# Patient Record
Sex: Female | Born: 1935 | Race: White | Hispanic: No | Smoking: Former smoker
Health system: Southern US, Community
[De-identification: ages and names within clinical notes are randomized; demographics above are authoritative.]

## PROBLEM LIST (undated history)

## (undated) DIAGNOSIS — M160 Bilateral primary osteoarthritis of hip: Secondary | ICD-10-CM

## (undated) DIAGNOSIS — G2581 Restless legs syndrome: Secondary | ICD-10-CM

## (undated) DIAGNOSIS — H3530 Unspecified macular degeneration: Secondary | ICD-10-CM

## (undated) DIAGNOSIS — G5760 Lesion of plantar nerve, unspecified lower limb: Secondary | ICD-10-CM

## (undated) DIAGNOSIS — R0600 Dyspnea, unspecified: Secondary | ICD-10-CM

## (undated) DIAGNOSIS — M7062 Trochanteric bursitis, left hip: Secondary | ICD-10-CM

## (undated) DIAGNOSIS — E11621 Type 2 diabetes mellitus with foot ulcer: Secondary | ICD-10-CM

## (undated) DIAGNOSIS — G4733 Obstructive sleep apnea (adult) (pediatric): Secondary | ICD-10-CM

## (undated) DIAGNOSIS — E118 Type 2 diabetes mellitus with unspecified complications: Secondary | ICD-10-CM

## (undated) DIAGNOSIS — R5383 Other fatigue: Secondary | ICD-10-CM

## (undated) DIAGNOSIS — I10 Essential (primary) hypertension: Secondary | ICD-10-CM

## (undated) DIAGNOSIS — G629 Polyneuropathy, unspecified: Secondary | ICD-10-CM

## (undated) DIAGNOSIS — K2960 Other gastritis without bleeding: Secondary | ICD-10-CM

## (undated) DIAGNOSIS — M5136 Other intervertebral disc degeneration, lumbar region: Secondary | ICD-10-CM

## (undated) DIAGNOSIS — R945 Abnormal results of liver function studies: Secondary | ICD-10-CM

## (undated) DIAGNOSIS — N3010 Interstitial cystitis (chronic) without hematuria: Secondary | ICD-10-CM

## (undated) DIAGNOSIS — E669 Obesity, unspecified: Secondary | ICD-10-CM

## (undated) DIAGNOSIS — M159 Polyosteoarthritis, unspecified: Secondary | ICD-10-CM

## (undated) DIAGNOSIS — Z87891 Personal history of nicotine dependence: Secondary | ICD-10-CM

## (undated) DIAGNOSIS — M51369 Other intervertebral disc degeneration, lumbar region without mention of lumbar back pain or lower extremity pain: Secondary | ICD-10-CM

## (undated) DIAGNOSIS — N951 Menopausal and female climacteric states: Secondary | ICD-10-CM

## (undated) DIAGNOSIS — I5032 Chronic diastolic (congestive) heart failure: Secondary | ICD-10-CM

## (undated) DIAGNOSIS — G5750 Tarsal tunnel syndrome, unspecified lower limb: Secondary | ICD-10-CM

## (undated) DIAGNOSIS — G20 Parkinson's disease: Secondary | ICD-10-CM

## (undated) DIAGNOSIS — G894 Chronic pain syndrome: Secondary | ICD-10-CM

## (undated) DIAGNOSIS — M7061 Trochanteric bursitis, right hip: Secondary | ICD-10-CM

## (undated) DIAGNOSIS — J189 Pneumonia, unspecified organism: Secondary | ICD-10-CM

## (undated) DIAGNOSIS — R1031 Right lower quadrant pain: Secondary | ICD-10-CM

## (undated) DIAGNOSIS — I872 Venous insufficiency (chronic) (peripheral): Secondary | ICD-10-CM

## (undated) DIAGNOSIS — Z9989 Dependence on other enabling machines and devices: Secondary | ICD-10-CM

## (undated) DIAGNOSIS — H269 Unspecified cataract: Secondary | ICD-10-CM

## (undated) DIAGNOSIS — K644 Residual hemorrhoidal skin tags: Secondary | ICD-10-CM

## (undated) DIAGNOSIS — R011 Cardiac murmur, unspecified: Secondary | ICD-10-CM

## (undated) DIAGNOSIS — E782 Mixed hyperlipidemia: Secondary | ICD-10-CM

## (undated) DIAGNOSIS — E66812 Obesity, class 2: Secondary | ICD-10-CM

## (undated) DIAGNOSIS — E1142 Type 2 diabetes mellitus with diabetic polyneuropathy: Secondary | ICD-10-CM

## (undated) DIAGNOSIS — I119 Hypertensive heart disease without heart failure: Secondary | ICD-10-CM

## (undated) DIAGNOSIS — E039 Hypothyroidism, unspecified: Secondary | ICD-10-CM

## (undated) DIAGNOSIS — K5730 Diverticulosis of large intestine without perforation or abscess without bleeding: Secondary | ICD-10-CM

## (undated) DIAGNOSIS — Z86010 Personal history of colonic polyps: Secondary | ICD-10-CM

## (undated) DIAGNOSIS — M419 Scoliosis, unspecified: Secondary | ICD-10-CM

## (undated) DIAGNOSIS — H35349 Macular cyst, hole, or pseudohole, unspecified eye: Secondary | ICD-10-CM

## (undated) DIAGNOSIS — K589 Irritable bowel syndrome without diarrhea: Secondary | ICD-10-CM

## (undated) HISTORY — DX: Chronic diastolic (congestive) heart failure: I50.32

## (undated) HISTORY — DX: Abnormal results of liver function studies: R94.5

## (undated) HISTORY — DX: Polyneuropathy, unspecified: G62.9

## (undated) HISTORY — DX: Tarsal tunnel syndrome, unspecified lower limb: G57.50

## (undated) HISTORY — DX: Obesity, class 2: E66.812

## (undated) HISTORY — PX: OTHER SURGICAL HISTORY: SHX169

## (undated) HISTORY — DX: Menopausal and female climacteric states: N95.1

## (undated) HISTORY — PX: HAMMER TOE SURGERY: SHX385

## (undated) HISTORY — PX: TONSILLECTOMY: SUR1361

## (undated) HISTORY — DX: Obesity, unspecified: E66.9

## (undated) HISTORY — DX: Other intervertebral disc degeneration, lumbar region without mention of lumbar back pain or lower extremity pain: M51.369

## (undated) HISTORY — DX: Venous insufficiency (chronic) (peripheral): I87.2

## (undated) HISTORY — DX: Dyspnea, unspecified: R06.00

## (undated) HISTORY — DX: Obstructive sleep apnea (adult) (pediatric): G47.33

## (undated) HISTORY — PX: ANKLE SURGERY: SHX546

## (undated) HISTORY — PX: TRANSTHORACIC ECHOCARDIOGRAM: SHX275

## (undated) HISTORY — DX: Unspecified cataract: H26.9

## (undated) HISTORY — PX: CARDIAC CATHETERIZATION: SHX172

## (undated) HISTORY — PX: TOTAL ABDOMINAL HYSTERECTOMY W/ BILATERAL SALPINGOOPHORECTOMY: SHX83

## (undated) HISTORY — DX: Trochanteric bursitis, right hip: M70.61

## (undated) HISTORY — DX: Scoliosis, unspecified: M41.9

## (undated) HISTORY — DX: Dependence on other enabling machines and devices: Z99.89

## (undated) HISTORY — DX: Chronic pain syndrome: G89.4

## (undated) HISTORY — DX: Bilateral primary osteoarthritis of hip: M16.0

## (undated) HISTORY — DX: Macular cyst, hole, or pseudohole, unspecified eye: H35.349

## (undated) HISTORY — DX: Irritable bowel syndrome, unspecified: K58.9

## (undated) HISTORY — DX: Hypertensive heart disease without heart failure: I11.9

## (undated) HISTORY — DX: Other intervertebral disc degeneration, lumbar region: M51.36

## (undated) HISTORY — DX: Right lower quadrant pain: R10.31

## (undated) HISTORY — PX: FOOT SURGERY: SHX648

## (undated) HISTORY — DX: Personal history of colonic polyps: Z86.010

## (undated) HISTORY — DX: Essential (primary) hypertension: I10

## (undated) HISTORY — DX: Other gastritis without bleeding: K29.60

## (undated) HISTORY — DX: Residual hemorrhoidal skin tags: K64.4

## (undated) HISTORY — DX: Lesion of plantar nerve, unspecified lower limb: G57.60

## (undated) HISTORY — DX: Hypothyroidism, unspecified: E03.9

## (undated) HISTORY — DX: Trochanteric bursitis, left hip: M70.62

## (undated) HISTORY — DX: Personal history of nicotine dependence: Z87.891

## (undated) HISTORY — DX: Diverticulosis of large intestine without perforation or abscess without bleeding: K57.30

## (undated) HISTORY — DX: Restless legs syndrome: G25.81

## (undated) HISTORY — DX: Mixed hyperlipidemia: E78.2

## (undated) HISTORY — DX: Unspecified macular degeneration: H35.30

## (undated) HISTORY — PX: COLONOSCOPY: SHX174

## (undated) HISTORY — DX: Type 2 diabetes mellitus with diabetic polyneuropathy: E11.42

## (undated) HISTORY — DX: Type 2 diabetes mellitus with unspecified complications: E11.8

## (undated) HISTORY — PX: CATARACT EXTRACTION: SUR2

## (undated) HISTORY — DX: Other fatigue: R53.83

## (undated) HISTORY — DX: Interstitial cystitis (chronic) without hematuria: N30.10

## (undated) SURGERY — Surgical Case
Anesthesia: *Unknown

---
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
## 1999-04-22 ENCOUNTER — Encounter: Payer: Self-pay | Admitting: Obstetrics and Gynecology

## 1999-04-22 ENCOUNTER — Encounter: Admission: RE | Admit: 1999-04-22 | Discharge: 1999-04-22 | Payer: Self-pay | Admitting: Obstetrics and Gynecology

## 2000-04-23 ENCOUNTER — Encounter: Admission: RE | Admit: 2000-04-23 | Discharge: 2000-04-23 | Payer: Self-pay | Admitting: Obstetrics and Gynecology

## 2000-04-23 ENCOUNTER — Encounter: Payer: Self-pay | Admitting: Obstetrics and Gynecology

## 2000-04-27 ENCOUNTER — Other Ambulatory Visit: Admission: RE | Admit: 2000-04-27 | Discharge: 2000-04-27 | Payer: Self-pay | Admitting: Obstetrics and Gynecology

## 2001-07-03 ENCOUNTER — Encounter: Admission: RE | Admit: 2001-07-03 | Discharge: 2001-07-03 | Payer: Self-pay | Admitting: Internal Medicine

## 2001-07-03 ENCOUNTER — Encounter: Payer: Self-pay | Admitting: Internal Medicine

## 2002-07-01 ENCOUNTER — Encounter: Admission: RE | Admit: 2002-07-01 | Discharge: 2002-07-01 | Payer: Self-pay | Admitting: Internal Medicine

## 2002-07-01 ENCOUNTER — Encounter: Payer: Self-pay | Admitting: Internal Medicine

## 2003-07-03 ENCOUNTER — Encounter: Admission: RE | Admit: 2003-07-03 | Discharge: 2003-07-03 | Payer: Self-pay | Admitting: Internal Medicine

## 2004-01-29 ENCOUNTER — Ambulatory Visit: Payer: Self-pay | Admitting: Internal Medicine

## 2004-03-10 ENCOUNTER — Ambulatory Visit: Payer: Self-pay | Admitting: Internal Medicine

## 2004-05-11 ENCOUNTER — Ambulatory Visit: Payer: Self-pay | Admitting: Internal Medicine

## 2004-06-30 ENCOUNTER — Ambulatory Visit: Payer: Self-pay | Admitting: Internal Medicine

## 2004-07-18 ENCOUNTER — Encounter: Admission: RE | Admit: 2004-07-18 | Discharge: 2004-07-18 | Payer: Self-pay | Admitting: Internal Medicine

## 2004-08-13 ENCOUNTER — Ambulatory Visit: Payer: Self-pay | Admitting: Family Medicine

## 2004-08-18 ENCOUNTER — Ambulatory Visit: Payer: Self-pay | Admitting: Internal Medicine

## 2004-08-25 ENCOUNTER — Ambulatory Visit: Payer: Self-pay | Admitting: Internal Medicine

## 2004-09-14 ENCOUNTER — Ambulatory Visit: Payer: Self-pay | Admitting: Gastroenterology

## 2004-09-16 ENCOUNTER — Ambulatory Visit: Payer: Self-pay | Admitting: Cardiology

## 2004-10-21 ENCOUNTER — Encounter: Admission: RE | Admit: 2004-10-21 | Discharge: 2004-10-21 | Payer: Self-pay | Admitting: Internal Medicine

## 2004-10-21 ENCOUNTER — Ambulatory Visit: Payer: Self-pay | Admitting: Internal Medicine

## 2004-10-28 ENCOUNTER — Ambulatory Visit: Payer: Self-pay | Admitting: Internal Medicine

## 2004-12-19 ENCOUNTER — Ambulatory Visit: Payer: Self-pay | Admitting: Internal Medicine

## 2004-12-19 ENCOUNTER — Encounter: Admission: RE | Admit: 2004-12-19 | Discharge: 2004-12-19 | Payer: Self-pay | Admitting: Internal Medicine

## 2005-01-10 ENCOUNTER — Ambulatory Visit: Payer: Self-pay | Admitting: Internal Medicine

## 2005-01-25 ENCOUNTER — Ambulatory Visit: Payer: Self-pay | Admitting: Internal Medicine

## 2005-02-23 ENCOUNTER — Ambulatory Visit: Payer: Self-pay | Admitting: Internal Medicine

## 2005-03-31 ENCOUNTER — Ambulatory Visit: Payer: Self-pay | Admitting: Internal Medicine

## 2005-04-04 ENCOUNTER — Ambulatory Visit: Payer: Self-pay | Admitting: Internal Medicine

## 2005-04-28 ENCOUNTER — Ambulatory Visit: Payer: Self-pay | Admitting: Internal Medicine

## 2005-05-01 ENCOUNTER — Ambulatory Visit: Payer: Self-pay | Admitting: Internal Medicine

## 2005-05-23 ENCOUNTER — Ambulatory Visit: Payer: Self-pay | Admitting: Internal Medicine

## 2005-06-21 ENCOUNTER — Ambulatory Visit: Payer: Self-pay | Admitting: Internal Medicine

## 2005-07-20 ENCOUNTER — Encounter: Admission: RE | Admit: 2005-07-20 | Discharge: 2005-07-20 | Payer: Self-pay | Admitting: Internal Medicine

## 2005-07-31 ENCOUNTER — Ambulatory Visit: Payer: Self-pay | Admitting: Internal Medicine

## 2005-08-16 ENCOUNTER — Ambulatory Visit: Payer: Self-pay | Admitting: Internal Medicine

## 2005-08-18 ENCOUNTER — Ambulatory Visit: Payer: Self-pay | Admitting: Endocrinology

## 2005-08-30 ENCOUNTER — Ambulatory Visit: Payer: Self-pay | Admitting: Internal Medicine

## 2005-11-06 ENCOUNTER — Ambulatory Visit: Payer: Self-pay | Admitting: Internal Medicine

## 2005-11-14 ENCOUNTER — Ambulatory Visit: Payer: Self-pay | Admitting: Internal Medicine

## 2005-12-12 ENCOUNTER — Ambulatory Visit (HOSPITAL_COMMUNITY): Admission: RE | Admit: 2005-12-12 | Discharge: 2005-12-12 | Payer: Self-pay | Admitting: Internal Medicine

## 2005-12-25 ENCOUNTER — Ambulatory Visit: Payer: Self-pay | Admitting: Internal Medicine

## 2006-01-03 ENCOUNTER — Ambulatory Visit: Payer: Self-pay | Admitting: Internal Medicine

## 2006-01-10 ENCOUNTER — Ambulatory Visit: Payer: Self-pay

## 2006-01-19 ENCOUNTER — Ambulatory Visit: Payer: Self-pay | Admitting: Internal Medicine

## 2006-01-31 ENCOUNTER — Ambulatory Visit (HOSPITAL_COMMUNITY): Admission: RE | Admit: 2006-01-31 | Discharge: 2006-01-31 | Payer: Self-pay | Admitting: Internal Medicine

## 2006-02-01 ENCOUNTER — Ambulatory Visit: Payer: Self-pay | Admitting: Internal Medicine

## 2006-02-01 ENCOUNTER — Ambulatory Visit: Payer: Self-pay

## 2006-02-16 ENCOUNTER — Ambulatory Visit: Payer: Self-pay | Admitting: Internal Medicine

## 2006-02-28 ENCOUNTER — Encounter: Admission: RE | Admit: 2006-02-28 | Discharge: 2006-03-29 | Payer: Self-pay | Admitting: Internal Medicine

## 2006-05-07 ENCOUNTER — Ambulatory Visit: Payer: Self-pay | Admitting: Internal Medicine

## 2006-06-05 ENCOUNTER — Ambulatory Visit: Payer: Self-pay | Admitting: Internal Medicine

## 2006-07-23 ENCOUNTER — Encounter: Admission: RE | Admit: 2006-07-23 | Discharge: 2006-07-23 | Payer: Self-pay | Admitting: Internal Medicine

## 2006-08-07 ENCOUNTER — Ambulatory Visit: Payer: Self-pay | Admitting: Internal Medicine

## 2006-09-20 ENCOUNTER — Ambulatory Visit: Payer: Self-pay | Admitting: Internal Medicine

## 2006-09-26 ENCOUNTER — Encounter: Payer: Self-pay | Admitting: Internal Medicine

## 2006-10-10 DIAGNOSIS — K589 Irritable bowel syndrome without diarrhea: Secondary | ICD-10-CM | POA: Insufficient documentation

## 2006-10-10 DIAGNOSIS — E039 Hypothyroidism, unspecified: Secondary | ICD-10-CM | POA: Insufficient documentation

## 2006-10-10 DIAGNOSIS — E785 Hyperlipidemia, unspecified: Secondary | ICD-10-CM | POA: Insufficient documentation

## 2006-10-10 DIAGNOSIS — I10 Essential (primary) hypertension: Secondary | ICD-10-CM

## 2006-10-10 DIAGNOSIS — G5760 Lesion of plantar nerve, unspecified lower limb: Secondary | ICD-10-CM | POA: Insufficient documentation

## 2006-10-22 ENCOUNTER — Ambulatory Visit: Payer: Self-pay | Admitting: Internal Medicine

## 2006-11-02 ENCOUNTER — Ambulatory Visit: Payer: Self-pay | Admitting: Internal Medicine

## 2006-11-14 ENCOUNTER — Ambulatory Visit (HOSPITAL_COMMUNITY): Admission: RE | Admit: 2006-11-14 | Discharge: 2006-11-14 | Payer: Self-pay | Admitting: Internal Medicine

## 2006-11-14 ENCOUNTER — Encounter: Payer: Self-pay | Admitting: Internal Medicine

## 2006-11-14 DIAGNOSIS — K5730 Diverticulosis of large intestine without perforation or abscess without bleeding: Secondary | ICD-10-CM | POA: Insufficient documentation

## 2006-11-14 DIAGNOSIS — K644 Residual hemorrhoidal skin tags: Secondary | ICD-10-CM | POA: Insufficient documentation

## 2006-11-22 ENCOUNTER — Ambulatory Visit: Payer: Self-pay | Admitting: Internal Medicine

## 2006-12-26 ENCOUNTER — Ambulatory Visit: Payer: Self-pay | Admitting: Internal Medicine

## 2007-01-03 ENCOUNTER — Ambulatory Visit: Payer: Self-pay | Admitting: Internal Medicine

## 2007-01-17 ENCOUNTER — Encounter: Admission: RE | Admit: 2007-01-17 | Discharge: 2007-01-17 | Payer: Self-pay | Admitting: Urology

## 2007-01-18 ENCOUNTER — Ambulatory Visit (HOSPITAL_BASED_OUTPATIENT_CLINIC_OR_DEPARTMENT_OTHER): Admission: RE | Admit: 2007-01-18 | Discharge: 2007-01-18 | Payer: Self-pay | Admitting: Urology

## 2007-02-15 ENCOUNTER — Ambulatory Visit: Payer: Self-pay | Admitting: Internal Medicine

## 2007-02-19 ENCOUNTER — Telehealth (INDEPENDENT_AMBULATORY_CARE_PROVIDER_SITE_OTHER): Payer: Self-pay | Admitting: *Deleted

## 2007-02-22 ENCOUNTER — Ambulatory Visit: Payer: Self-pay | Admitting: Internal Medicine

## 2007-02-22 DIAGNOSIS — M76899 Other specified enthesopathies of unspecified lower limb, excluding foot: Secondary | ICD-10-CM | POA: Insufficient documentation

## 2007-02-24 ENCOUNTER — Encounter: Payer: Self-pay | Admitting: Internal Medicine

## 2007-02-26 ENCOUNTER — Encounter: Payer: Self-pay | Admitting: Internal Medicine

## 2007-02-27 ENCOUNTER — Encounter: Payer: Self-pay | Admitting: Internal Medicine

## 2007-03-04 ENCOUNTER — Encounter: Payer: Self-pay | Admitting: Internal Medicine

## 2007-04-24 ENCOUNTER — Encounter: Payer: Self-pay | Admitting: Internal Medicine

## 2007-04-26 DIAGNOSIS — R197 Diarrhea, unspecified: Secondary | ICD-10-CM

## 2007-04-26 DIAGNOSIS — N3010 Interstitial cystitis (chronic) without hematuria: Secondary | ICD-10-CM

## 2007-04-26 DIAGNOSIS — F411 Generalized anxiety disorder: Secondary | ICD-10-CM | POA: Insufficient documentation

## 2007-04-30 ENCOUNTER — Encounter: Payer: Self-pay | Admitting: Internal Medicine

## 2007-05-02 ENCOUNTER — Ambulatory Visit: Payer: Self-pay | Admitting: Internal Medicine

## 2007-05-02 LAB — CONVERTED CEMR LAB
AST: 20 units/L (ref 0–37)
Albumin: 4 g/dL (ref 3.5–5.2)
Alkaline Phosphatase: 81 units/L (ref 39–117)
BUN: 17 mg/dL (ref 6–23)
Basophils Absolute: 0 10*3/uL (ref 0.0–0.1)
Calcium: 10 mg/dL (ref 8.4–10.5)
Chloride: 100 meq/L (ref 96–112)
Cholesterol: 204 mg/dL (ref 0–200)
Eosinophils Absolute: 0 10*3/uL (ref 0.0–0.6)
Eosinophils Relative: 0.1 % (ref 0.0–5.0)
GFR calc Af Amer: 106 mL/min
Glucose, Bld: 98 mg/dL (ref 70–99)
Monocytes Relative: 4.4 % (ref 3.0–11.0)
Platelets: 392 10*3/uL (ref 150–400)
Potassium: 4.2 meq/L (ref 3.5–5.1)
RBC: 4.62 M/uL (ref 3.87–5.11)
RDW: 13 % (ref 11.5–14.6)
Sodium: 139 meq/L (ref 135–145)
TSH: 3.3 microintl units/mL (ref 0.35–5.50)
Total Bilirubin: 0.9 mg/dL (ref 0.3–1.2)

## 2007-05-03 ENCOUNTER — Telehealth: Payer: Self-pay | Admitting: Internal Medicine

## 2007-05-07 ENCOUNTER — Encounter: Payer: Self-pay | Admitting: Internal Medicine

## 2007-05-07 LAB — CONVERTED CEMR LAB
Dopamine 24 Hr Urine: 138 mcg/24hr (ref <500)
Epinephrine 24 Hr Urine: 7 mcg/24hr (ref <20)
Metaneph Total, Ur: 643 ug/24hr (ref 224–832)
Normetanephrine, 24H Ur: 547 (ref 122–676)

## 2007-05-21 ENCOUNTER — Encounter: Payer: Self-pay | Admitting: Internal Medicine

## 2007-05-23 ENCOUNTER — Ambulatory Visit: Payer: Self-pay | Admitting: Internal Medicine

## 2007-06-01 ENCOUNTER — Ambulatory Visit: Payer: Self-pay | Admitting: Family Medicine

## 2007-06-17 ENCOUNTER — Encounter: Payer: Self-pay | Admitting: Internal Medicine

## 2007-07-04 ENCOUNTER — Encounter: Admission: RE | Admit: 2007-07-04 | Discharge: 2007-08-27 | Payer: Self-pay | Admitting: Orthopedic Surgery

## 2007-07-24 ENCOUNTER — Encounter: Admission: RE | Admit: 2007-07-24 | Discharge: 2007-07-24 | Payer: Self-pay | Admitting: Internal Medicine

## 2007-08-27 ENCOUNTER — Encounter: Payer: Self-pay | Admitting: Internal Medicine

## 2007-09-09 ENCOUNTER — Ambulatory Visit: Payer: Self-pay | Admitting: Internal Medicine

## 2007-09-23 ENCOUNTER — Telehealth: Payer: Self-pay | Admitting: Internal Medicine

## 2007-09-23 ENCOUNTER — Encounter: Payer: Self-pay | Admitting: Internal Medicine

## 2007-09-24 ENCOUNTER — Telehealth: Payer: Self-pay | Admitting: Internal Medicine

## 2007-10-09 ENCOUNTER — Ambulatory Visit: Payer: Self-pay | Admitting: Internal Medicine

## 2007-10-14 ENCOUNTER — Encounter: Payer: Self-pay | Admitting: Internal Medicine

## 2007-10-22 ENCOUNTER — Encounter: Payer: Self-pay | Admitting: Internal Medicine

## 2007-10-22 ENCOUNTER — Telehealth: Payer: Self-pay | Admitting: Internal Medicine

## 2007-10-29 ENCOUNTER — Ambulatory Visit: Payer: Self-pay | Admitting: Internal Medicine

## 2007-10-30 ENCOUNTER — Telehealth: Payer: Self-pay | Admitting: Internal Medicine

## 2007-11-18 ENCOUNTER — Encounter: Payer: Self-pay | Admitting: Internal Medicine

## 2007-11-26 ENCOUNTER — Encounter: Payer: Self-pay | Admitting: Internal Medicine

## 2007-12-10 ENCOUNTER — Encounter: Payer: Self-pay | Admitting: Internal Medicine

## 2008-01-02 ENCOUNTER — Encounter: Payer: Self-pay | Admitting: Internal Medicine

## 2008-01-20 ENCOUNTER — Encounter: Payer: Self-pay | Admitting: Internal Medicine

## 2008-02-04 ENCOUNTER — Encounter: Payer: Self-pay | Admitting: Internal Medicine

## 2008-02-24 ENCOUNTER — Encounter: Payer: Self-pay | Admitting: Internal Medicine

## 2008-02-25 ENCOUNTER — Ambulatory Visit: Payer: Self-pay | Admitting: Internal Medicine

## 2008-02-26 LAB — CONVERTED CEMR LAB
BUN: 24 mg/dL — ABNORMAL HIGH (ref 6–23)
CO2: 23 meq/L (ref 19–32)
Calcium: 9.5 mg/dL (ref 8.4–10.5)
Chloride: 103 meq/L (ref 96–112)
Creatinine, Ser: 0.7 mg/dL (ref 0.4–1.2)
Folate: >20 ng/mL
Free T4: 0.7 ng/dL (ref 0.6–1.6)
GFR calc Af Amer: 106 mL/min
GFR calc non Af Amer: 87 mL/min
Monocytes Relative: 3.9 % (ref 3.0–12.0)
Potassium: 4.3 meq/L (ref 3.5–5.1)
Saturation Ratios: 18.7 % — ABNORMAL LOW (ref 20.0–50.0)
Sodium: 138 meq/L (ref 135–145)
TSH: 2.24 microintl units/mL (ref 0.35–5.50)
Transferrin: 331.5 mg/dL (ref >=212.0)
Vitamin B-12: 413 pg/mL (ref 211–911)
WBC: 9.4 10*3/uL (ref 4.5–10.5)

## 2008-03-05 ENCOUNTER — Encounter: Payer: Self-pay | Admitting: Internal Medicine

## 2008-03-10 ENCOUNTER — Ambulatory Visit: Payer: Self-pay | Admitting: Internal Medicine

## 2008-03-15 ENCOUNTER — Telehealth: Payer: Self-pay | Admitting: Internal Medicine

## 2008-03-26 ENCOUNTER — Ambulatory Visit: Payer: Self-pay | Admitting: Internal Medicine

## 2008-03-29 ENCOUNTER — Telehealth: Payer: Self-pay | Admitting: Internal Medicine

## 2008-03-30 ENCOUNTER — Encounter: Payer: Self-pay | Admitting: Internal Medicine

## 2008-04-07 ENCOUNTER — Encounter: Payer: Self-pay | Admitting: Internal Medicine

## 2008-07-28 ENCOUNTER — Encounter: Admission: RE | Admit: 2008-07-28 | Discharge: 2008-07-28 | Payer: Self-pay | Admitting: Internal Medicine

## 2008-08-26 ENCOUNTER — Encounter: Payer: Self-pay | Admitting: Internal Medicine

## 2008-10-03 ENCOUNTER — Encounter: Admission: RE | Admit: 2008-10-03 | Discharge: 2008-10-03 | Payer: Self-pay | Admitting: Orthopedic Surgery

## 2008-10-03 ENCOUNTER — Encounter: Payer: Self-pay | Admitting: Internal Medicine

## 2008-10-08 ENCOUNTER — Encounter: Payer: Self-pay | Admitting: Internal Medicine

## 2008-11-05 ENCOUNTER — Ambulatory Visit: Payer: Self-pay | Admitting: Internal Medicine

## 2008-11-06 ENCOUNTER — Telehealth: Payer: Self-pay | Admitting: Internal Medicine

## 2008-11-09 ENCOUNTER — Encounter: Payer: Self-pay | Admitting: Internal Medicine

## 2008-11-13 ENCOUNTER — Telehealth: Payer: Self-pay | Admitting: Internal Medicine

## 2008-11-14 ENCOUNTER — Encounter: Payer: Self-pay | Admitting: Internal Medicine

## 2008-11-14 ENCOUNTER — Encounter: Admission: RE | Admit: 2008-11-14 | Discharge: 2008-11-14 | Payer: Self-pay | Admitting: Orthopedic Surgery

## 2008-11-17 ENCOUNTER — Encounter: Payer: Self-pay | Admitting: Internal Medicine

## 2008-11-25 ENCOUNTER — Encounter: Payer: Self-pay | Admitting: Internal Medicine

## 2008-12-30 ENCOUNTER — Ambulatory Visit: Payer: Self-pay | Admitting: Internal Medicine

## 2008-12-30 LAB — CONVERTED CEMR LAB
ALT: 46 units/L — ABNORMAL HIGH (ref 0–35)
Alkaline Phosphatase: 87 units/L (ref 39–117)
Basophils Absolute: 0.3 10*3/uL — ABNORMAL HIGH (ref 0.0–0.1)
CO2: 28 meq/L (ref 19–32)
Calcium: 8.7 mg/dL (ref 8.4–10.5)
Chloride: 99 meq/L (ref 96–112)
Eosinophils Absolute: 0.1 10*3/uL (ref 0.0–0.7)
Folate: >20 ng/mL
Glucose, Bld: 115 mg/dL — ABNORMAL HIGH (ref 70–99)
HCT: 39.7 % (ref 36.0–46.0)
HDL: 35.1 mg/dL — ABNORMAL LOW (ref >39.00)
Hemoglobin: 13.7 g/dL (ref 12.0–15.0)
Iron: 44 ug/dL (ref 42–145)
Lymphocytes Relative: 21.9 % (ref 12.0–46.0)
MCHC: 34.5 g/dL (ref 30.0–36.0)
Monocytes Absolute: 0.6 10*3/uL (ref 0.1–1.0)
Monocytes Relative: 7.9 % (ref 3.0–12.0)
Neutrophils Relative %: 65.2 % (ref 43.0–77.0)
Platelets: 305 10*3/uL (ref 150.0–400.0)
RDW: 13 % (ref 11.5–14.6)
Sodium: 139 meq/L (ref 135–145)
TSH: 1.24 microintl units/mL (ref 0.35–5.50)
Triglycerides: 270 mg/dL — ABNORMAL HIGH (ref 0.0–149.0)
Vitamin B-12: 517 pg/mL (ref 211–911)

## 2008-12-31 DIAGNOSIS — N951 Menopausal and female climacteric states: Secondary | ICD-10-CM | POA: Insufficient documentation

## 2009-01-19 ENCOUNTER — Telehealth: Payer: Self-pay | Admitting: Internal Medicine

## 2009-02-02 ENCOUNTER — Encounter: Payer: Self-pay | Admitting: Internal Medicine

## 2009-02-03 ENCOUNTER — Ambulatory Visit: Payer: Self-pay | Admitting: Internal Medicine

## 2009-03-22 ENCOUNTER — Ambulatory Visit: Payer: Self-pay | Admitting: Internal Medicine

## 2009-03-25 ENCOUNTER — Encounter (INDEPENDENT_AMBULATORY_CARE_PROVIDER_SITE_OTHER): Payer: Self-pay | Admitting: *Deleted

## 2009-03-25 ENCOUNTER — Telehealth: Payer: Self-pay | Admitting: Internal Medicine

## 2009-03-30 ENCOUNTER — Telehealth: Payer: Self-pay | Admitting: Internal Medicine

## 2009-03-31 ENCOUNTER — Ambulatory Visit: Payer: Self-pay | Admitting: Gastroenterology

## 2009-03-31 ENCOUNTER — Telehealth: Payer: Self-pay | Admitting: Internal Medicine

## 2009-03-31 DIAGNOSIS — Z862 Personal history of diseases of the blood and blood-forming organs and certain disorders involving the immune mechanism: Secondary | ICD-10-CM

## 2009-03-31 DIAGNOSIS — Z8639 Personal history of other endocrine, nutritional and metabolic disease: Secondary | ICD-10-CM

## 2009-04-01 LAB — CONVERTED CEMR LAB
BUN: 16 mg/dL (ref 6–23)
CO2: 32 meq/L (ref 19–32)
Calcium: 9.3 mg/dL (ref 8.4–10.5)
Chloride: 101 meq/L (ref 96–112)
Creatinine, Ser: 0.7 mg/dL (ref 0.4–1.2)
Potassium: 3.6 meq/L (ref 3.5–5.1)
Total Bilirubin: 0.6 mg/dL (ref 0.3–1.2)
Total Protein: 6.3 g/dL (ref 6.0–8.3)

## 2009-04-05 ENCOUNTER — Ambulatory Visit: Payer: Self-pay | Admitting: Internal Medicine

## 2009-04-19 ENCOUNTER — Telehealth: Payer: Self-pay | Admitting: Internal Medicine

## 2009-05-03 ENCOUNTER — Telehealth: Payer: Self-pay | Admitting: Internal Medicine

## 2009-05-10 ENCOUNTER — Ambulatory Visit: Payer: Self-pay | Admitting: Internal Medicine

## 2009-05-14 ENCOUNTER — Telehealth: Payer: Self-pay | Admitting: Internal Medicine

## 2009-05-25 HISTORY — PX: ESOPHAGOGASTRODUODENOSCOPY: SHX1529

## 2009-05-27 ENCOUNTER — Ambulatory Visit: Payer: Self-pay | Admitting: Internal Medicine

## 2009-05-27 ENCOUNTER — Encounter (INDEPENDENT_AMBULATORY_CARE_PROVIDER_SITE_OTHER): Payer: Self-pay | Admitting: *Deleted

## 2009-05-27 DIAGNOSIS — E669 Obesity, unspecified: Secondary | ICD-10-CM | POA: Insufficient documentation

## 2009-05-27 DIAGNOSIS — R1013 Epigastric pain: Secondary | ICD-10-CM | POA: Insufficient documentation

## 2009-05-31 ENCOUNTER — Ambulatory Visit: Payer: Self-pay | Admitting: Internal Medicine

## 2009-06-07 ENCOUNTER — Ambulatory Visit: Payer: Self-pay | Admitting: Gastroenterology

## 2009-06-07 ENCOUNTER — Telehealth: Payer: Self-pay | Admitting: Internal Medicine

## 2009-06-07 ENCOUNTER — Ambulatory Visit: Payer: Self-pay | Admitting: Internal Medicine

## 2009-06-07 DIAGNOSIS — K2960 Other gastritis without bleeding: Secondary | ICD-10-CM | POA: Insufficient documentation

## 2009-06-07 DIAGNOSIS — R1031 Right lower quadrant pain: Secondary | ICD-10-CM | POA: Insufficient documentation

## 2009-06-07 DIAGNOSIS — R109 Unspecified abdominal pain: Secondary | ICD-10-CM | POA: Insufficient documentation

## 2009-06-07 LAB — CONVERTED CEMR LAB
AST: 23 units/L (ref 0–37)
Albumin: 3.7 g/dL (ref 3.5–5.2)
Amylase: 61 units/L (ref 27–131)
BUN: 17 mg/dL (ref 6–23)
Basophils Absolute: 0 10*3/uL (ref 0.0–0.1)
Basophils Relative: 0 % (ref 0.0–3.0)
Bilirubin Urine: NEGATIVE
Calcium: 9.4 mg/dL (ref 8.4–10.5)
Creatinine, Ser: 0.8 mg/dL (ref 0.4–1.2)
Eosinophils Absolute: 0.1 10*3/uL (ref 0.0–0.7)
Eosinophils Relative: 1.2 % (ref 0.0–5.0)
GFR calc non Af Amer: 74.67 mL/min (ref >60)
Glucose, Bld: 117 mg/dL — ABNORMAL HIGH (ref 70–99)
Hemoglobin, Urine: NEGATIVE
Hemoglobin: 13.8 g/dL (ref 12.0–15.0)
Ketones, ur: NEGATIVE mg/dL
Lipase: 29 units/L (ref 11.0–59.0)
Lymphocytes Relative: 18.5 % (ref 12.0–46.0)
MCHC: 33.8 g/dL (ref 30.0–36.0)
Potassium: 3.7 meq/L (ref 3.5–5.1)
RBC: 4.36 M/uL (ref 3.87–5.11)
Sodium: 142 meq/L (ref 135–145)
TSH: 2.32 microintl units/mL (ref 0.35–5.50)
Total Bilirubin: 0.3 mg/dL (ref 0.3–1.2)
WBC: 9 10*3/uL (ref 4.5–10.5)

## 2009-06-14 ENCOUNTER — Ambulatory Visit: Payer: Self-pay | Admitting: Internal Medicine

## 2009-07-30 ENCOUNTER — Encounter: Admission: RE | Admit: 2009-07-30 | Discharge: 2009-07-30 | Payer: Self-pay | Admitting: Internal Medicine

## 2009-08-16 ENCOUNTER — Encounter: Payer: Self-pay | Admitting: Internal Medicine

## 2009-09-08 ENCOUNTER — Telehealth (INDEPENDENT_AMBULATORY_CARE_PROVIDER_SITE_OTHER): Payer: Self-pay | Admitting: *Deleted

## 2009-09-08 ENCOUNTER — Encounter: Payer: Self-pay | Admitting: Internal Medicine

## 2009-09-09 ENCOUNTER — Ambulatory Visit: Payer: Self-pay | Admitting: Internal Medicine

## 2009-09-09 ENCOUNTER — Encounter: Payer: Self-pay | Admitting: Internal Medicine

## 2009-09-20 ENCOUNTER — Encounter: Payer: Self-pay | Admitting: Internal Medicine

## 2009-09-22 ENCOUNTER — Telehealth: Payer: Self-pay | Admitting: Internal Medicine

## 2009-10-27 ENCOUNTER — Encounter: Payer: Self-pay | Admitting: Internal Medicine

## 2009-11-08 ENCOUNTER — Encounter: Payer: Self-pay | Admitting: Internal Medicine

## 2009-11-23 ENCOUNTER — Telehealth: Payer: Self-pay | Admitting: Internal Medicine

## 2009-12-01 ENCOUNTER — Ambulatory Visit: Payer: Self-pay | Admitting: Internal Medicine

## 2009-12-09 ENCOUNTER — Telehealth: Payer: Self-pay | Admitting: Internal Medicine

## 2009-12-14 ENCOUNTER — Encounter: Payer: Self-pay | Admitting: Internal Medicine

## 2009-12-15 ENCOUNTER — Encounter: Payer: Self-pay | Admitting: Internal Medicine

## 2009-12-21 ENCOUNTER — Telehealth: Payer: Self-pay | Admitting: Internal Medicine

## 2009-12-29 ENCOUNTER — Ambulatory Visit: Payer: Self-pay | Admitting: Vascular Surgery

## 2009-12-29 ENCOUNTER — Encounter: Payer: Self-pay | Admitting: Internal Medicine

## 2010-01-12 ENCOUNTER — Ambulatory Visit: Payer: Self-pay | Admitting: Internal Medicine

## 2010-01-12 DIAGNOSIS — R61 Generalized hyperhidrosis: Secondary | ICD-10-CM

## 2010-01-12 DIAGNOSIS — I872 Venous insufficiency (chronic) (peripheral): Secondary | ICD-10-CM | POA: Insufficient documentation

## 2010-01-12 DIAGNOSIS — G5750 Tarsal tunnel syndrome, unspecified lower limb: Secondary | ICD-10-CM | POA: Insufficient documentation

## 2010-01-14 ENCOUNTER — Ambulatory Visit: Payer: Self-pay | Admitting: Vascular Surgery

## 2010-01-17 ENCOUNTER — Telehealth: Payer: Self-pay | Admitting: Internal Medicine

## 2010-01-26 ENCOUNTER — Encounter: Payer: Self-pay | Admitting: Internal Medicine

## 2010-02-11 ENCOUNTER — Telehealth: Payer: Self-pay | Admitting: Internal Medicine

## 2010-02-16 ENCOUNTER — Telehealth: Payer: Self-pay | Admitting: Internal Medicine

## 2010-02-25 ENCOUNTER — Telehealth: Payer: Self-pay | Admitting: Internal Medicine

## 2010-02-28 ENCOUNTER — Ambulatory Visit: Payer: Self-pay | Admitting: Internal Medicine

## 2010-03-01 ENCOUNTER — Encounter: Payer: Self-pay | Admitting: Internal Medicine

## 2010-03-04 ENCOUNTER — Telehealth: Payer: Self-pay | Admitting: Internal Medicine

## 2010-03-16 ENCOUNTER — Telehealth: Payer: Self-pay | Admitting: Internal Medicine

## 2010-03-29 ENCOUNTER — Ambulatory Visit
Admission: RE | Admit: 2010-03-29 | Discharge: 2010-03-29 | Payer: Self-pay | Source: Home / Self Care | Attending: Internal Medicine | Admitting: Internal Medicine

## 2010-04-18 ENCOUNTER — Encounter: Payer: Self-pay | Admitting: Orthopedic Surgery

## 2010-04-28 NOTE — Letter (Signed)
Summary: [facility]   Imported By: Lester [place] 11/12/2009 10:20:44  _____________________________________________________________________  External Attachment:    Type:   Image     Comment:   External Document

## 2010-04-28 NOTE — Assessment & Plan Note (Signed)
Summary: DR MEN--ABDOMINAL WALL PAIN--PAM/3RD FLR/GI--STC   Vital Signs:  Patient profile:   77 year old female Height:      63 inches Weight:      187 pounds O2 Sat:      95 % on Room air Temp:     98.3 degrees F oral Pulse rate:   96 / minute Pulse rhythm:   regular Resp:     16 per minute BP sitting:   144 / 82  (left arm) Cuff size:   large  Vitals Entered By: Rock Nephew CMA (June 07, 2009 3:29 PM)  O2 Flow:  Room air CC: abdominal pain x 5 days//ref by [facility] Is Patient Diabetic? No Pain Assessment Patient in pain? yes     Location: abdomen   Primary Care Provider:  [name], MD  CC:  abdominal pain x 5 days//ref by [facility].  History of Present Illness: New  to me she complains of RLQ abd. pain for 5 days. In reviewing the notes, it sounds like this pain has been migrating around her abd. for the last few weeks which prompted an EGD by GI. The pain today is described  by her as a "toothache" in her skin and it is constant and worsens when she lays down. She has 3-4 normal BM's per day.  Preventive Screening-Counseling & Management  Alcohol-Tobacco     Alcohol drinks/day: 0     Smoking Status: quit  Hep-HIV-STD-Contraception     Hepatitis Risk: no risk noted     HIV Risk: no risk noted     STD Risk: no risk noted  Medications Prior to Update: 1)  Loradamed 10 Mg  Tabs (Loratadine) .... One Tab Once Daily 2)  Adult Aspirin Low Strength 81 Mg  Tbdp (Aspirin) .... Once Daily 3)  Bl Multiple Vitamins   Tabs (Multiple Vitamins-Minerals) .... Once Daily 4)  Levoxyl 25 Mcg  Tabs (Levothyroxine Sodium) .... Once Daily 5)  Metoprolol Tartrate 25 Mg  Tabs (Metoprolol Tartrate) .Marland Kitchen.. 1 By Mouth Two Times A Day. 6)  Klor-Con M20 20 Meq  Tbcr (Potassium Chloride Crys Cr) .... Take 1 Tablet By Mouth Once A Day 7)  Pravastatin Sodium 40 Mg  Tabs (Pravastatin Sodium) .... Once Daily 8)  Alprazolam 0.25 Mg  Tabs (Alprazolam) .... As Needed 9)  Tums 500 Mg  Chew (Calcium  Carbonate Antacid) .... 2 Tabs Daily 10)  Hydrochlorothiazide 25 Mg Tabs (Hydrochlorothiazide) .Marland Kitchen.. 1 By Mouth Once Daily 11)  Flonase 50 Mcg/act  Susp (Fluticasone Propionate) .... 2 Squirts Each Nostril Daily As Needed 12)  Mobic 15 Mg Tabs (Meloxicam) .Marland Kitchen.. 1 Once Daily 13)  Hydrocodone-Acetaminophen 5-325 Mg Tabs (Hydrocodone-Acetaminophen) .... As Needed For Back Pain 14)  Methocarbamol 750 Mg Tabs (Methocarbamol) .... As Needed 15)  Premarin 0.3 Mg Tabs (Estrogens Conjugated) .Marland Kitchen.. 1 Tab Daily 16)  Nexium 40 Mg Cpdr (Esomeprazole Magnesium) .... Take 1 Capsule Daily 30 Min Before Breakfast 17)  Nortriptyline Hcl 25 Mg Caps (Nortriptyline Hcl) .Marland Kitchen.. 1 By Mouth Q Hs  Current Medications (verified): 1)  Loradamed 10 Mg  Tabs (Loratadine) .... One Tab Once Daily 2)  Adult Aspirin Low Strength 81 Mg  Tbdp (Aspirin) .... Once Daily 3)  Bl Multiple Vitamins   Tabs (Multiple Vitamins-Minerals) .... Once Daily 4)  Levoxyl 25 Mcg  Tabs (Levothyroxine Sodium) .... Once Daily 5)  Metoprolol Tartrate 25 Mg  Tabs (Metoprolol Tartrate) .Marland Kitchen.. 1 By Mouth Two Times A Day. 6)  Klor-Con M20 20 Meq  Tbcr (Potassium Chloride Crys Cr) .... Take 1 Tablet By Mouth Once A Day 7)  Pravastatin Sodium 40 Mg  Tabs (Pravastatin Sodium) .... Once Daily 8)  Alprazolam 0.25 Mg  Tabs (Alprazolam) .... As Needed 9)  Tums 500 Mg  Chew (Calcium Carbonate Antacid) .... 2 Tabs Daily 10)  Hydrochlorothiazide 25 Mg Tabs (Hydrochlorothiazide) .Marland Kitchen.. 1 By Mouth Once Daily 11)  Flonase 50 Mcg/act  Susp (Fluticasone Propionate) .... 2 Squirts Each Nostril Daily As Needed 12)  Mobic 15 Mg Tabs (Meloxicam) .Marland Kitchen.. 1 Once Daily 13)  Hydrocodone-Acetaminophen 5-325 Mg Tabs (Hydrocodone-Acetaminophen) .... As Needed For Back Pain 14)  Methocarbamol 750 Mg Tabs (Methocarbamol) .... As Needed 15)  Premarin 0.3 Mg Tabs (Estrogens Conjugated) .Marland Kitchen.. 1 Tab Daily 16)  Nexium 40 Mg Cpdr (Esomeprazole Magnesium) .... Take 1 Capsule Daily 30 Min Before  Breakfast 17)  Nortriptyline Hcl 25 Mg Caps (Nortriptyline Hcl) .Marland Kitchen.. 1 By Mouth Q Hs 18)  Levbid 0.375 Mg Xr12h-Tab (Hyoscyamine Sulfate) .... One By Mouth Two Times A Day As Needed For Pain  Allergies (verified): 1)  Sulfa  Past History:  Past Medical History: Reviewed history from 06/07/2009 and no changes required. 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 Hysterectomy Tonsillectomy Foot surgery Oophorectomy  Family History: Reviewed history from 10/29/2007 and no changes required. father - deceased @ 64: CAD/MI-fatal, HTN mother- 109: in a nursing home; breast cancer Neg- colon, DM GF - DM No FH of Colon Cancer:  Social History: Reviewed history from 05/27/2009 and no changes required. HSG Married '57 3 sons - '60, '61, '64; grandchildren 8 (7 girls, 1 boy) lives independently with husband. Patient is a former smoker.  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  The patient denies anorexia, fever, weight loss, weight gain, chest pain, syncope, dyspnea on exertion, peripheral edema, prolonged cough, headaches, hemoptysis, melena, hematochezia, severe indigestion/heartburn, hematuria, suspicious skin lesions, abnormal bleeding, and enlarged lymph nodes.    General:  Denies chills, fatigue, fever, loss of appetite, malaise, sleep disorder, sweats, weakness, and weight loss. GI:  Complains of abdominal pain; denies bloody stools, change in bowel habits, constipation, dark tarry stools, diarrhea, excessive appetite, gas, hemorrhoids, indigestion, loss of appetite, nausea, vomiting, vomiting blood, and yellowish skin color. GU:  Complains of urinary frequency; denies abnormal vaginal bleeding, discharge, dysuria, hematuria, incontinence, nocturia, and urinary hesitancy. Endo:  Denies cold intolerance, excessive hunger, excessive thirst, excessive urination, heat intolerance, polyuria, and weight change.  Physical Exam  General:  Well-developed,well-nourished,in no acute distress; alert,appropriate and cooperative throughout examination Head:  normocephalic and atraumatic.   Eyes:  corneas and lenses clear and no injection.  no icterus. Ears:  R ear normal and L ear normal.   Mouth:  Oral mucosa and oropharynx without lesions or exudates.  Teeth in good repair. Neck:  full ROM.   Lungs:  normal respiratory effort, no intercostal retractions, no accessory muscle  use, normal breath sounds, no dullness, no fremitus, no crackles, and no wheezes.   Heart:  normal rate, regular rhythm, no murmur, no gallop, no rub, and no JVD.   Abdomen:  soft, normal bowel sounds, no distention, no masses, no guarding, no rigidity, no rebound tenderness, no abdominal hernia, no inguinal hernia, no hepatomegaly, no splenomegaly, and RLQ tenderness.   Rectal:  No external abnormalities noted. Normal sphincter tone. No rectal masses or tenderness. Msk:  normal ROM, no joint tenderness, no joint swelling, no joint warmth, no redness over joints, no joint deformities, no joint instability, and no crepitation.   Pulses:  R and L carotid,radial,femoral,dorsalis pedis and posterior tibial pulses are full and equal bilaterally Extremities:  No clubbing, cyanosis, edema, or deformity  noted with normal full range of motion of all joints.   Neurologic:  No cranial nerve deficits noted. Station and gait are normal. Plantar reflexes are down-going bilaterally. DTRs are symmetrical throughout. Sensory, motor and coordinative functions appear intact. Skin:  Intact without suspicious lesions or rashes Cervical Nodes:  no anterior cervical adenopathy and no posterior cervical adenopathy.   Axillary Nodes:  no R axillary adenopathy and no L axillary adenopathy.   Inguinal Nodes:  no R inguinal adenopathy and no L inguinal adenopathy.   Psych:  Cognition and judgment appear intact. Alert and cooperative with normal attention span and concentration. No apparent delusions, illusions, hallucinations   Impression & Recommendations:  Problem # 1:  ABDOMINAL PAIN, RIGHT LOWER QUADRANT (ICD-789.03) Assessment New labs to look for causes, i think this is ibs, will check plain films to look for sbo Orders: Prescription Created Electronically [phone]) T-Abdomen 2-view (74020TC)  Problem # 2:  HYPOTHYROIDISM NOS (ICD-244.9) Assessment: Unchanged  Her updated medication list for this problem includes:    Levoxyl 25 Mcg Tabs (Levothyroxine sodium) ..... Once daily  Orders: Venipuncture (13244) TLB-BMP (Basic Metabolic Panel-BMET) (80048-METABOL) TLB-CBC Platelet - w/Differential (85025-CBCD) TLB-Hepatic/Liver Function Pnl (80076-HEPATIC) TLB-TSH (Thyroid Stimulating Hormone) (84443-TSH) TLB-Amylase (82150-AMYL) TLB-Lipase (83690-LIPASE) TLB-Udip w/ Micro (81001-URINE)  Problem # 3:  IRRITABLE BOWEL SYNDROME (ICD-564.1) Assessment: Deteriorated  try levbid for symptom relief  Orders: Venipuncture (01027) TLB-BMP (Basic Metabolic Panel-BMET) (80048-METABOL) TLB-CBC Platelet - w/Differential (85025-CBCD) TLB-Hepatic/Liver Function Pnl (80076-HEPATIC) TLB-TSH (Thyroid Stimulating Hormone) (84443-TSH) TLB-Amylase (82150-AMYL) TLB-Lipase (83690-LIPASE) TLB-Udip w/ Micro  (81001-URINE)  Complete Medication List: 1)  Loradamed 10 Mg Tabs (Loratadine) .... One tab once daily 2)  Adult Aspirin Low Strength 81 Mg Tbdp (Aspirin) .... Once daily 3)  Bl Multiple Vitamins Tabs (Multiple vitamins-minerals) .... Once daily 4)  Levoxyl 25 Mcg Tabs (Levothyroxine sodium) .... Once daily 5)  Metoprolol Tartrate 25 Mg Tabs (Metoprolol tartrate) .Marland Kitchen.. 1 by mouth two times a day. 6)  Klor-con M20 20 Meq Tbcr (Potassium chloride crys cr) .... Take 1 tablet by mouth once a day 7)  Pravastatin Sodium 40 Mg Tabs (Pravastatin sodium) .... Once daily 8)  Alprazolam 0.25 Mg Tabs (Alprazolam) .... As needed 9)  Tums 500 Mg Chew (Calcium carbonate antacid) .... 2 tabs daily 10)  Hydrochlorothiazide 25 Mg Tabs (Hydrochlorothiazide) .Marland Kitchen.. 1 by mouth once daily 11)  Flonase 50 Mcg/act Susp (Fluticasone propionate) .... 2 squirts each nostril daily as needed 12)  Mobic 15 Mg Tabs (Meloxicam) .Marland Kitchen.. 1 once daily 13)  Hydrocodone-acetaminophen 5-325 Mg Tabs (Hydrocodone-acetaminophen) .... As needed for back pain 14)  Methocarbamol 750 Mg Tabs (Methocarbamol) .... As needed 15)  Premarin 0.3 Mg Tabs (Estrogens conjugated) .Marland Kitchen.. 1 tab daily 16)  Nexium 40 Mg  Cpdr (Esomeprazole magnesium) .... Take 1 capsule daily 30 min before breakfast 17)  Nortriptyline Hcl 25 Mg Caps (Nortriptyline hcl) .Marland Kitchen.. 1 by mouth q hs 18)  Levbid 0.375 Mg Xr12h-tab (Hyoscyamine sulfate) .... One by mouth two times a day as needed for pain  Patient Instructions: 1)  Please schedule a follow-up appointment in 2 weeks. Prescriptions: LEVBID 0.375 MG XR12H-TAB (HYOSCYAMINE SULFATE) One by mouth two times a day as needed for pain  #50 x 1   Entered and Authorized by:   Etta Grandchild MD   Signed by:   Etta Grandchild MD on 06/07/2009   Method used:   Electronically to        [facility]  [address] [phone]* (retail)       [address] [place], [place]  [address]       Ph: [phone] or [phone]       Fax:  [phone]   RxID:   [id number]

## 2010-04-28 NOTE — Assessment & Plan Note (Signed)
Summary: F/U ABD pain, saw Willette Cluster NP    History of Present Illness Visit Type: Follow-up Visit Primary GI MD: Stan Head MD [facility] Primary Provider: [name], MD Requesting Provider: n/a Chief Complaint: Chalky taste in mouth History of Present Illness:   77 yo woman followed here intermittently for IBS mainly. " I was doing great until last Friday" Her mother had called her from a nursing home in [place] asking to be taken out of the nursing home she is 96). She does ? if stress is related to her abdominal pain. Has a gnbawing epigastric that is helped by food. is back on Mobic but did not take today. When she was off that her joints hurt quite a bit. Chalky taste in her mouth started about 5 days ago. also dry mouth. No regurgitation/reflux. Has been on Nexium since January.   GI Review of Systems      Denies abdominal pain, acid reflux, belching, bloating, chest pain, dysphagia with liquids, dysphagia with solids, heartburn, loss of appetite, nausea, vomiting, vomiting blood, weight loss, and  weight gain.        Denies anal fissure, black tarry stools, change in bowel habit, constipation, diarrhea, diverticulosis, fecal incontinence, heme positive stool, hemorrhoids, irritable bowel syndrome, jaundice, light color stool, liver problems, rectal bleeding, and  rectal pain.    Current Medications (verified): 1)  Loradamed 10 Mg  Tabs (Loratadine) .... One Tab Once Daily 2)  Zantac 150 Mg  Caps (Ranitidine Hcl) .... Once Daily 3)  Adult Aspirin Low Strength 81 Mg  Tbdp (Aspirin) .... Once Daily 4)  Bl Multiple Vitamins   Tabs (Multiple Vitamins-Minerals) .... Once Daily 5)  Levoxyl 25 Mcg  Tabs (Levothyroxine Sodium) .... Once Daily 6)  Metoprolol Tartrate 25 Mg  Tabs (Metoprolol Tartrate) .Marland Kitchen.. 1 By Mouth Two Times A Day. 7)  Klor-Con M20 20 Meq  Tbcr (Potassium Chloride Crys Cr) .... Take 1 Tablet By Mouth Once A Day 8)  Pravastatin Sodium 40 Mg  Tabs (Pravastatin Sodium)  .... Once Daily 9)  Alprazolam 0.25 Mg  Tabs (Alprazolam) .... As Needed 10)  Tums 500 Mg  Chew (Calcium Carbonate Antacid) .... 2 Tabs Daily 11)  Hydrochlorothiazide 25 Mg Tabs (Hydrochlorothiazide) .Marland Kitchen.. 1 By Mouth Once Daily 12)  Flonase 50 Mcg/act  Susp (Fluticasone Propionate) .... 2 Squirts Each Nostril Daily As Needed 13)  Mobic 15 Mg Tabs (Meloxicam) .Marland Kitchen.. 1 Once Daily 14)  Hydrocodone-Acetaminophen 5-325 Mg Tabs (Hydrocodone-Acetaminophen) .... As Needed For Back Pain 15)  Methocarbamol 750 Mg Tabs (Methocarbamol) .... As Needed 16)  Premarin 0.3 Mg Tabs (Estrogens Conjugated) .Marland Kitchen.. 1 Tab Daily 17)  Nexium 40 Mg Cpdr (Esomeprazole Magnesium) .... Take 1 Capsule Daily 30 Min Before Breakfast 18)  Nortriptyline Hcl 25 Mg Caps (Nortriptyline Hcl) .Marland Kitchen.. 1 By Mouth Q Hs  Allergies (verified): 1)  Sulfa  Past History:  Past Medical History: Reviewed history from 03/31/2009 and no changes required. DIVERTICULOSIS OF COLON (ICD-562.10) EXTERNAL HEMORRHOIDS (ICD-455.3) DIARRHEA, CHRONIC (ICD-787.91) secondary to IBS vrs MICROSCOPIC COLITIS INTERSTITIAL CYSTITIS (ICD-595.1) BURSITIS, HIP (ICD-726.5) HYPOTHYROIDISM NOS (ICD-244.9) MORTON'S NEUROMA (ICD-355.6) ARTHRITIS, CLIMACTERIC, UNSPECIFIED SITE (ICD-716.30) HYPERTENSION (ICD-401.9) HYPERLIPIDEMIA (ICD-272.4) Leg Pain (bilateral) ANXIETY (ICD-300.00)  Past Surgical History: Reviewed history from 11/05/2008 and no changes required. Hysterectomy Tonsillectomy Foot surgery Oophorectomy  Family History: Reviewed history from 10/29/2007 and no changes required. father - deceased @ 24: CAD/MI-fatal, HTN mother- 52: in a nursing home; breast cancer Neg- colon, DM GF - DM No FH of Colon  Cancer:  Social History: HSG Married '57 3 sons - '60, '61, '64; grandchildren 8 (7 girls, 1 boy) lives independently with husband. Patient is a former smoker.  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  NAD Eyes:  anicteric Lungs:  Clear throughout to auscultation. Heart:  Regular rate and rhythm; no murmurs, rubs,  or bruits. Abdomen:  obese, soft and mildly tender in epigastrium without HSM/ass small, reducible umbilical hernia Psych:  Alert and cooperative. Normal mood and affect.   Impression & Recommendations:  Problem # 1:  EPIGASTRIC PAIN (ICD-789.06) Assessment Deteriorated  She was better on PPI and able to resume Mobic wihout problems but after her mother called it returned. Likely functional (Xamnax probably helps) but recurent on NSAIDs so an EGD is appropriate.  Orders: EGD (EGD)  Problem # 2:  LIVER FUNCTION TESTS, ABNORMAL, HX OF (ICD-V12.2) Assessment: Improved last check was normal.  Problem # 3:  OBESITY (ICD-278.00) Assessment: Unchanged we discussd need to lose weight and she ecognizes importance. advice given in patient instructions  Patient Instructions: 1)  Copy sent to : [name], MD 2)  Please continue current medications.  3)  [facility] brochure given.  4)  See you at your 3/7 EGD (arrive 130 PM 4th Floor) 5)  You need to lose weight. Start by limiting portions, amounts. Avoid eating when not hungry. Limit desserts.Look for high fructose corn syrup on food labels and if in first 3 ingredients, avoid that food. Also try to eat whole grains, avoid "white foods" (e.g. white rice, white bread).   6)  The medication list was reviewed and reconciled.  All changed / newly prescribed  medications were explained.  A complete medication list was provided to the patient / caregiver.

## 2010-04-28 NOTE — Progress Notes (Signed)
Summary: Leg cramps  Phone Note Call from Patient   Summary of Call: MD stopped nortriptyline and pt's sweats have stopped. Patient is requesting rx to help w/leg cramps.  Initial call taken by: Lamar Sprinkles, CMA,  February 11, 2010 1:25 PM  Follow-up for Phone Call        patient with leg cramps. Nortriptyline helps but makes her sweat. reviewed dr. Virgilio Belling note and reults of arterial doppler - negative.  Plan - trail of tonic water           check at drugstore for quinine product Follow-up by: Jacques Navy MD,  February 15, 2010 10:37 AM

## 2010-04-28 NOTE — Assessment & Plan Note (Signed)
Summary: THINKS BP IS ELEV/ SWEATS WITH EXERTION/NWS   Vital Signs:  Patient profile:   77 year old female Height:      63 inches Weight:      182 pounds BMI:     32.36 O2 Sat:      97 % on Room air Temp:     97.2 degrees F oral Pulse rate:   76 / minute BP sitting:   142 / 86  (left arm) Cuff size:   regular  Vitals Entered By: Bill Salinas CMA (January 12, 2010 8:50 AM)  O2 Flow:  Room air CC: ov for evaluation on elevated BP/ ab Comments Pt needs 90 day supply refills on Metoprolol, Klor-Con, and Nortriptyline sent to [facility]. She states she is no longer taking Mobic. Pt is due for a tetanus shot./ ab   Primary Care Provider:  [name], MD  CC:  ov for evaluation on elevated BP/ ab.  History of Present Illness: Interval history: she has had tarsal tunnel release left ankle and is going to have the right side done. Dr. Lestine Box was her surgeon. She has had relief of foot pain with the surgery. she has seen Dr. Durwin Nora for vascular - not a surgical candidate. He has her wearing 15-21mmHg stockings daily along with elevation of the legs and this has helped the peripheral edema. She does have a lot of "throbbing" pain in the legs at night after removing the hose. In addition, putting on the hose has aggravated her OA hands.   She presents today for transient elevation of BP with an elevated baseline. She also has had problem with sweating with mild exertion. This is limited to the head and neck. She has had no chest pain or discomfort in association with the sweats. Reviewed labs from march '11 - all normal including TSH. Reviewed all medications - nortriptyline can cause sweats.   Current Medications (verified): 1)  Loradamed 10 Mg  Tabs (Loratadine) .... One Tab Once Daily 2)  Aspirin 325 Mg Tabs (Aspirin) .Marland Kitchen.. 1 Tablet Daily 3)  Bl Multiple Vitamins   Tabs (Multiple Vitamins-Minerals) .... Once Daily 4)  Levoxyl 25 Mcg  Tabs (Levothyroxine Sodium) .... Once Daily 5)  Metoprolol  Tartrate 25 Mg  Tabs (Metoprolol Tartrate) .Marland Kitchen.. 1 By Mouth Two Times A Day. 6)  Klor-Con M20 20 Meq  Tbcr (Potassium Chloride Crys Cr) .... Take 1 Tablet By Mouth Once A Day 7)  Pravastatin Sodium 40 Mg  Tabs (Pravastatin Sodium) .... Once Daily 8)  Alprazolam 0.25 Mg  Tabs (Alprazolam) .... As Needed 9)  Tums 500 Mg  Chew (Calcium Carbonate Antacid) .... 2 Tabs Daily 10)  Hydrochlorothiazide 25 Mg Tabs (Hydrochlorothiazide) .Marland Kitchen.. 1 By Mouth Once Daily 11)  Flonase 50 Mcg/act  Susp (Fluticasone Propionate) .... 2 Squirts Each Nostril Daily As Needed 12)  Mobic 15 Mg Tabs (Meloxicam) .Marland Kitchen.. 1 Once Daily 13)  Hydrocodone-Acetaminophen 5-325 Mg Tabs (Hydrocodone-Acetaminophen) .... As Needed For Back Pain 14)  Methocarbamol 750 Mg Tabs (Methocarbamol) .... As Needed 15)  Premarin 0.3 Mg Tabs (Estrogens Conjugated) .Marland Kitchen.. 1 Tab Daily 16)  Nexium 40 Mg Cpdr (Esomeprazole Magnesium) .... Take 1 Capsule Daily 30 Min Before Breakfast 17)  Nortriptyline Hcl 25 Mg Caps (Nortriptyline Hcl) .Marland Kitchen.. 1 By Mouth Q Hs 18)  Levbid 0.375 Mg Xr12h-Tab (Hyoscyamine Sulfate) .... One By Mouth Two Times A Day As Needed For Pain 19)  Gabapentin 100 Mg Caps (Gabapentin) .Marland Kitchen.. 1 Once Daily  Allergies (verified): 1)  Sulfa  Past History:  Past Medical History: VENOUS INSUFFICIENCY, LEGS (ICD-459.81) TARSAL TUNNEL SYNDROME (ICD-355.5) ABDOMINAL PAIN, RIGHT LOWER QUADRANT (ICD-789.03) EROSIVE GASTRITIS (ICD-535.40) ABDOMINAL WALL PAIN (ICD-789.09) OBESITY (ICD-278.00) EPIGASTRIC PAIN (ICD-789.06) LIVER FUNCTION TESTS, ABNORMAL, HX OF (ICD-V12.2) MENOPAUSAL SYNDROME (ICD-627.2) DIVERTICULOSIS OF COLON (ICD-562.10) EXTERNAL HEMORRHOIDS (ICD-455.3) ANXIETY (ICD-300.00) DIARRHEA, CHRONIC (ICD-787.91) INTERSTITIAL CYSTITIS (ICD-595.1) BURSITIS, HIP (ICD-726.5) HYPOTHYROIDISM NOS (ICD-244.9) IRRITABLE BOWEL SYNDROME (ICD-564.1) MORTON'S NEUROMA (ICD-355.6) HYPERTENSION (ICD-401.9) HYPERLIPIDEMIA (ICD-272.4)  Past  Surgical History: Hysterectomy Tonsillectomy Foot surgery Oophorectomy Left Tarsal tunnel release  Sept '11 Lestine Box)  Review of Systems  The patient denies anorexia, fever, weight loss, decreased hearing, hoarseness, chest pain, syncope, dyspnea on exertion, prolonged cough, abdominal pain, severe indigestion/heartburn, incontinence, difficulty walking, unusual weight change, enlarged lymph nodes, and angioedema.    Physical Exam  General:  alert, well-developed, well-nourished, and well-hydrated.   Head:  normocephalic and atraumatic.   Eyes:  vision grossly intact, pupils equal, and pupils round.   Neck:  supple, full ROM, and no thyromegaly.   Lungs:  normal respiratory effort and normal breath sounds.   Heart:  normal rate and regular rhythm.   Abdomen:  soft, non-tender, and normal bowel sounds.   Msk:  normal ROM, no joint tenderness, no joint swelling, and no joint warmth.   Pulses:  2+ radial Neurologic:  alert & oriented X3, cranial nerves II-XII intact, and strength normal in all extremities.   Skin:  turgor normal, color normal, and no suspicious lesions.   Cervical Nodes:  no anterior cervical adenopathy and no posterior cervical adenopathy.   Psych:  Oriented X3, memory intact for recent and remote, normally interactive, and good eye contact.     Impression & Recommendations:  Problem # 1:  VENOUS INSUFFICIENCY, LEGS (ICD-459.81) Patient wearing stockings and elevating feet. Will continue  Problem # 2:  HYPERTENSION (ICD-401.9)  Her updated medication list for this problem includes:    Metoprolol Tartrate 25 Mg Tabs (Metoprolol tartrate) .Marland Kitchen... 2 tabs two times a day (50mh two times a day)    Hydrochlorothiazide 25 Mg Tabs (Hydrochlorothiazide) .Marland Kitchen... 1 by mouth once daily  BP today: 142/86 Prior BP: 142/80 (06/14/2009)  Labs Reviewed: K+: 3.7 (06/07/2009) Creat: : 0.8 (06/07/2009)   Chol: 141 (12/30/2008)   HDL: 35.10 (12/30/2008)   LDL: DEL (05/02/2007)   TG:  270.0 (12/30/2008)  Plan - increase metoprolol to 50mg  two times a day.            Check BP and watch heart rate. Call for heart rate less than 60  Problem # 3:  SWEATING (ICD-780.8) Reviewed labs and meds  Plan - hold nortriptyline as possible cause of sweats.  Complete Medication List: 1)  Loradamed 10 Mg Tabs (Loratadine) .... One tab once daily 2)  Aspirin 325 Mg Tabs (Aspirin) .Marland Kitchen.. 1 tablet daily 3)  Bl Multiple Vitamins Tabs (Multiple vitamins-minerals) .... Once daily 4)  Levoxyl 25 Mcg Tabs (Levothyroxine sodium) .... Once daily 5)  Metoprolol Tartrate 25 Mg Tabs (Metoprolol tartrate) .... 2 tabs two times a day (50mh two times a day) 6)  Klor-con M20 20 Meq Tbcr (Potassium chloride crys cr) .... Take 1 tablet by mouth once a day 7)  Pravastatin Sodium 40 Mg Tabs (Pravastatin sodium) .... Once daily 8)  Alprazolam 0.25 Mg Tabs (Alprazolam) .... As needed 9)  Tums 500 Mg Chew (Calcium carbonate antacid) .... 2 tabs daily 10)  Hydrochlorothiazide 25 Mg Tabs (Hydrochlorothiazide) .Marland Kitchen.. 1 by mouth once daily 11)  Flonase 50 Mcg/act  Susp (Fluticasone propionate) .... 2 squirts each nostril daily as needed 12)  Hydrocodone-acetaminophen 5-325 Mg Tabs (Hydrocodone-acetaminophen) .... As needed for back pain 13)  Methocarbamol 750 Mg Tabs (Methocarbamol) .... As needed 14)  Premarin 0.3 Mg Tabs (Estrogens conjugated) .Marland Kitchen.. 1 tab daily 15)  Nexium 40 Mg Cpdr (Esomeprazole magnesium) .... Take 1 capsule daily 30 min before breakfast 16)  Nortriptyline Hcl 25 Mg Caps (Nortriptyline hcl) .Marland Kitchen.. 1 by mouth q hs 17)  Levbid 0.375 Mg Xr12h-tab (Hyoscyamine sulfate) .... One by mouth two times a day as needed for pain  Patient Instructions: 1)  Blood pressure - running a bit high. Plan - increase metoprolol to 50mg  AM and PM. Will need to watch your pulse - want to keep it at 60- or greater. 2)  Sweating - all labs are normal, exam is unrevealing. Reviewed medications and nortriptyline can cause  sweating. Plan - hold off taking the nortriptyline for several days to see if the sweating gets better.  Prescriptions: NORTRIPTYLINE HCL 25 MG CAPS (NORTRIPTYLINE HCL) 1 by mouth q hs  #90 x 3   Entered and Authorized by:   Jacques Navy MD   Signed by:   Jacques Navy MD on 01/12/2010   Method used:   Faxed to ...       [facility] (mail-order)             , [place]         Ph: [phone]       Fax: [phone]   RxID:   [id number] KLOR-CON M20 20 MEQ  TBCR (POTASSIUM CHLORIDE CRYS CR) Take 1 tablet by mouth once a day  #90 Tablet x 3   Entered and Authorized by:   Jacques Navy MD   Signed by:   Jacques Navy MD on 01/12/2010   Method used:   Faxed to ...       [facility] (mail-order)             , [place]         Ph: [phone]       Fax: [phone]   RxID:   [id number] METOPROLOL TARTRATE 25 MG  TABS (METOPROLOL TARTRATE) 1 by mouth two times a day.  #180 Tablet x 3   Entered and Authorized by:   Jacques Navy MD   Signed by:   Jacques Navy MD on 01/12/2010   Method used:   Faxed to ...       [facility] (mail-order)             , [place]         Ph: [phone]       Fax: [phone]   RxID:   [id number]    Orders Added: 1)  Est. Patient Level IV [62376]

## 2010-04-28 NOTE — Progress Notes (Signed)
Summary: REFERRAL   Phone Note Call from Patient Call back at Home Phone [phone]   Summary of Call: Patient is requesting referral to foot specialist. She c/o pain in bottom of her feet daily. She can not stand w/o shoes on due to pain. Pt has gotten injections in the past from Dr Debby Bud but now would like to see specialist. Please put in referral.  Initial call taken by: Lamar Sprinkles, CMA,  September 22, 2009 9:01 AM  Follow-up for Phone Call        pcc notified - podiatry referral, Dr. Heloise Beecham Follow-up by: Jacques Navy MD,  September 22, 2009 1:48 PM

## 2010-04-28 NOTE — Assessment & Plan Note (Signed)
Summary: COLD/NWS   Vital Signs:  Patient profile:   77 year old female Height:      63 inches Weight:      186 pounds BMI:     33.07 O2 Sat:      98 % on Room air Temp:     97.6 degrees F oral Pulse rate:   74 / minute BP sitting:   142 / 80  (left arm) Cuff size:   regular  Vitals Entered By: Bill Salinas CMA (April 05, 2009 9:02 AM)  O2 Flow:  Room air CC: pt here with complaint of sore throat and runny nose x 3 days/ ab   Primary Care Provider:  [name], MD  CC:  pt here with complaint of sore throat and runny nose x 3 days/ ab.  History of Present Illness: Stomach - doing better on PPI and now is on nexium. She is doing better. See last GI note from Jan 5th which has been reviewed.  She presents today for sore throat. She has had pain since Friday. Mild odynophagia. Feels like mucus is stuck. She is concerned for developoing broncitis. No fever, no chills and no sputum production.  Current Medications (verified): 1)  Loradamed 10 Mg  Tabs (Loratadine) .... One Tab Once Daily 2)  Zantac 150 Mg  Caps (Ranitidine Hcl) .... Once Daily 3)  Adult Aspirin Low Strength 81 Mg  Tbdp (Aspirin) .... Once Daily 4)  Bl Multiple Vitamins   Tabs (Multiple Vitamins-Minerals) .... Once Daily 5)  Levoxyl 25 Mcg  Tabs (Levothyroxine Sodium) .... Once Daily 6)  Metoprolol Tartrate 25 Mg  Tabs (Metoprolol Tartrate) .Marland Kitchen.. 1 By Mouth Two Times A Day. 7)  Klor-Con M20 20 Meq  Tbcr (Potassium Chloride Crys Cr) .... Take 1 Tablet By Mouth Once A Day 8)  Pravastatin Sodium 40 Mg  Tabs (Pravastatin Sodium) .... Once Daily 9)  Alprazolam 0.25 Mg  Tabs (Alprazolam) .... As Needed 10)  Tums 500 Mg  Chew (Calcium Carbonate Antacid) .... 2 Tabs Daily 11)  Hydrochlorothiazide 25 Mg Tabs (Hydrochlorothiazide) .Marland Kitchen.. 1 By Mouth Once Daily 12)  Flonase 50 Mcg/act  Susp (Fluticasone Propionate) .... 2 Squirts Each Nostril Daily As Needed 13)  Lomotil 2.5-0.025 Mg  Tabs (Diphenoxylate-Atropine) .Marland Kitchen.. 1-2  Every 6 Hrs As Needed Diarrhea 14)  Mobic 15 Mg Tabs (On Hold) (Meloxicam) .... Take 1 Tablet By Mouth Once A Day 15)  Cipro 250 Mg Tabs (Ciprofloxacin Hcl) .Marland Kitchen.. 1 Tab At Bedtime 16)  Hydrocodone-Acetaminophen 5-325 Mg Tabs (Hydrocodone-Acetaminophen) .... As Needed For Back Pain 17)  Methocarbamol 750 Mg Tabs (Methocarbamol) .... As Needed 18)  Lyrica 50 Mg Caps (Pregabalin) .Marland Kitchen.. 1 By Mouth Two Times A Day 19)  Premarin 0.3 Mg Tabs (Estrogens Conjugated) .Marland Kitchen.. 1 Tab Daily 20)  Indomethacin 25 Mg Caps  (On Hold)  (Indomethacin) .Marland Kitchen.. 1 By Mouth Three Times A Day As Needed Flare of Gout As Needed 21)  Nexium 40 Mg Cpdr (Esomeprazole Magnesium) .... 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) BURSITIS, HIP (ICD-726.5) HYPOTHYROIDISM NOS (ICD-244.9) MORTON'S NEUROMA (ICD-355.6) ARTHRITIS, CLIMACTERIC, UNSPECIFIED SITE (ICD-716.30) HYPERTENSION (ICD-401.9) HYPERLIPIDEMIA (ICD-272.4) Leg Pain (bilateral) ANXIETY (ICD-300.00)  Past Surgical History: Last updated: 11/05/2008 Hysterectomy Tonsillectomy Foot surgery Oophorectomy  Family History: Last updated: 02-Nov-2007 father - deceased @  68: CAD/MI-fatal, HTN mother- 59: in a nursing home; breast cancer Neg- colon, DM GF - DM No FH of Colon Cancer:  Social History: Last updated: 10/29/2007 HSG Married '57 3 sons - '60, '61, '64; grandchildren 8 (7 girls, 1 boy) lives independently with husband. Patient is a former smoker.  Alcohol Use - yes socially  not in years Illicit Drug Use - no Daily Caffeine Use  sip of coke  Risk Factors: Caffeine Use: 1 (09/09/2007)  Risk Factors: Smoking Status: quit (10/29/2007) PMH-FH-SH reviewed-no changes except  otherwise noted  Review of Systems  The patient denies anorexia, fever, weight loss, weight gain, vision loss, decreased hearing, hoarseness, prolonged cough, abdominal pain, hematochezia, severe indigestion/heartburn, muscle weakness, suspicious skin lesions, difficulty walking, and depression.    Physical Exam  General:  Well-developed,well-nourished,in no acute distress; alert,appropriate and cooperative throughout examination Head:  Normocephalic and atraumatic without obvious abnormalities. No apparent alopecia or balding. Eyes:  corneas and lenses clear and no injection.   Mouth:  posterior pharynx clear Neck:  full ROM and no thyromegaly.   Lungs:  Normal respiratory effort, chest expands symmetrically. Lungs are clear to auscultation, no crackles or wheezes. Heart:  normal rate and regular rhythm.   Neurologic:  alert & oriented X3 and cranial nerves II-XII intact.   Skin:  turgor normal and color normal.   Cervical Nodes:  no anterior cervical adenopathy and no posterior cervical adenopathy.   Psych:  Oriented X3 and memory intact for recent and remote.     Impression & Recommendations:  Problem # 1:  PHARYNGITIS (ICD-462) Patient with sore throat and concern for progressive infection.  Plan - z-pak as directed.           supportive care.  The following medications were removed from the medication list:    Cipro 250 Mg Tabs (Ciprofloxacin hcl) .Marland Kitchen... 1 tab at bedtime Her updated medication list for this problem includes:    Adult Aspirin Low Strength 81 Mg Tbdp (Aspirin) ..... Once daily    Zithromax Z-pak 250 Mg Tabs (Azithromycin) .Marland Kitchen... As directed  Complete Medication List: 1)  Loradamed 10 Mg Tabs (Loratadine) .... One tab once daily 2)  Zantac 150 Mg Caps (Ranitidine hcl) .... Once daily 3)  Adult Aspirin Low Strength 81 Mg Tbdp (Aspirin) .... Once daily 4)  Bl Multiple Vitamins Tabs (Multiple vitamins-minerals) .... Once daily 5)  Levoxyl 25 Mcg Tabs  (Levothyroxine sodium) .... Once daily 6)  Metoprolol Tartrate 25 Mg Tabs (Metoprolol tartrate) .Marland Kitchen.. 1 by mouth two times a day. 7)  Klor-con M20 20 Meq Tbcr (Potassium chloride crys cr) .... Take 1 tablet by mouth once a day 8)  Pravastatin Sodium 40 Mg Tabs (Pravastatin sodium) .... Once daily 9)  Alprazolam 0.25 Mg Tabs (Alprazolam) .... As needed 10)  Tums 500 Mg Chew (Calcium carbonate antacid) .... 2 tabs daily 11)  Hydrochlorothiazide 25 Mg Tabs (Hydrochlorothiazide) .Marland Kitchen.. 1 by mouth once daily 12)  Flonase 50 Mcg/act Susp (Fluticasone propionate) .... 2 squirts each nostril daily as needed 13)  Lomotil 2.5-0.025 Mg Tabs (Diphenoxylate-atropine) .Marland Kitchen.. 1-2 every 6 hrs as needed diarrhea 14)  Mobic 15 Mg Tabs (on Hold) (meloxicam)  .... Take 1 tablet by mouth once a day 15)  Hydrocodone-acetaminophen 5-325 Mg Tabs (Hydrocodone-acetaminophen) .... As needed for back pain 16)  Methocarbamol 750 Mg Tabs (Methocarbamol) .... As needed 17)  Lyrica 50 Mg Caps (Pregabalin) .Marland Kitchen.. 1 by mouth two times a day 18)  Premarin 0.3 Mg Tabs (Estrogens  conjugated) .Marland Kitchen.. 1 tab daily 19)  Nexium 40 Mg Cpdr (Esomeprazole magnesium) .... Take 1 capsule daily 30 min before breakfast 20)  Zithromax Z-pak 250 Mg Tabs (Azithromycin) .... As directed Prescriptions: ZITHROMAX Z-PAK 250 MG TABS (AZITHROMYCIN) as directed  #1 x 0   Entered and Authorized by:   Jacques Navy MD   Signed by:   Jacques Navy MD on 04/05/2009   Method used:   Handwritten   RxID:   [id number]

## 2010-04-28 NOTE — Letter (Signed)
Summary: [facility]  [facility]   Imported By: Sherian Rein 12/21/2009 12:58:27  _____________________________________________________________________  External Attachment:    Type:   Image     Comment:   External Document

## 2010-04-28 NOTE — Consult Note (Signed)
Summary: [facility]  [facility]   Imported By: Lennie Odor 12/17/2009 12:05:10  _____________________________________________________________________  External Attachment:    Type:   Image     Comment:   External Document

## 2010-04-28 NOTE — Progress Notes (Signed)
Summary: diarrhea  Medications Added LEVBID 0.375 MG XR12H-TAB (HYOSCYAMINE SULFATE) One by mouth two times a day as needed for pain and diarrhea       Phone Note Call from Patient Call back at [facility] Phone [phone]   Caller: Patient Call For: Dr. Leone Payor Reason for Call: Talk to Nurse Summary of Call: pt still dealing with diarrhea... Diphenoxylape-apropine helps only temporarily Initial call taken by: Vallarie Mare,  January 17, 2010 1:24 PM  Follow-up for Phone Call        Patient  has diarrhea 1 time a day for the last few weeks.  She is taking lomotil daily and the loose stools return the next day.  Patient is asked if she has been taking her hyoscyamine and she hasn't.  I have sent her a refill.  I have asked her to take it two times a day and call back if she still has diarrhea.  Follow-up by: Darcey Nora RN, CGRN,  January 17, 2010 2:48 PM    New/Updated Medications: LEVBID 0.375 MG XR12H-TAB (HYOSCYAMINE SULFATE) One by mouth two times a day as needed for pain and diarrhea Prescriptions: LEVBID 0.375 MG XR12H-TAB (HYOSCYAMINE SULFATE) One by mouth two times a day as needed for pain and diarrhea  #60 x 3   Entered by:   Darcey Nora RN, CGRN   Authorized by:   Iva Boop MD, [facility]   Signed by:   Darcey Nora RN, CGRN on 01/17/2010   Method used:   Electronically to        [facility]  [address] [phone]* (retail)       [address] [place], [place]  [address]       Ph: [phone] or [phone]       Fax: [phone]   RxID:   [id number]

## 2010-04-28 NOTE — Progress Notes (Signed)
  Phone Note Refill Request Message from:  Fax from Pharmacy on May 14, 2009 9:32 AM  Refills Requested: Medication #1:  MOBIC 15 MG TABS (ON HOLD) (MELOXICAM) Take 1 tablet by mouth once a day Please Advise refill.  Initial call taken by: Ami Bullins CMA,  May 14, 2009 9:32 AM  Follow-up for Phone Call        ok for as needed refills Follow-up by: Jacques Navy MD,  May 14, 2009 10:53 AM    Prescriptions: MOBIC 15 MG TABS (ON HOLD) (MELOXICAM) Take 1 tablet by mouth once a day  #30 x 1   Entered by:   Ami Bullins CMA   Authorized by:   Jacques Navy MD   Signed by:   Bill Salinas CMA on 05/14/2009   Method used:   Faxed to ...       [facility]  [address] [phone][facility] (retail)       [address]       [place], [place]  [address]       Ph: [phone] or [phone]       Fax: [phone]   RxID:   [id number]

## 2010-04-28 NOTE — Letter (Signed)
Summary: EGD Instructions  [facility]  [address] [place], [place] [address]   Phone: [phone]  Fax: [phone]       Katherine Best    09-May-1933    MRN: [id number]       Procedure Day [name]: [name], 05/31/09     Arrival Time: 1:30 PM     Procedure Time: 2:30 PM     Location of Procedure:                    _X_ [facility] (4th Floor)   PREPARATION FOR ENDOSCOPY   On MONDAY, 05/31/09 THE DAY OF THE PROCEDURE:  1.   No solid foods, milk or milk products are allowed after midnight the night before your procedure.  2.   Do not drink anything colored red or purple.  Avoid juices with pulp.  No orange juice.  3.  You may drink clear liquids until12:30 PM, which is 2 hours before your procedure.                                                                                                CLEAR LIQUIDS INCLUDE: Water Jello Ice Popsicles Tea (sugar ok, no milk/cream) Powdered fruit flavored drinks Coffee (sugar ok, no milk/cream) Gatorade Juice: apple, white grape, white cranberry  Lemonade Clear bullion, consomm, broth Carbonated beverages (any kind) Strained chicken noodle soup Hard Candy   MEDICATION INSTRUCTIONS  Unless otherwise instructed, you should take regular prescription medications with a small sip of water as early as possible the morning of your procedure.                   OTHER INSTRUCTIONS  You will need a responsible adult at least 75 years of age to accompany you and drive you home.   This person must remain in the waiting room during your procedure.  Wear loose fitting clothing that is easily removed.  Leave jewelry and other valuables at home.  However, you may wish to bring a book to read or an iPod/MP3 player to listen to music as you wait for your procedure to start.  Remove all body piercing jewelry and leave at home.  Total time from sign-in until discharge is approximately 2-3 hours.  You  should go home directly after your procedure and rest.  You can resume normal activities the day after your procedure.  The day of your procedure you should not:   Drive   Make legal decisions   Operate machinery   Drink alcohol   Return to work  You will receive specific instructions about eating, activities and medications before you leave.    The above instructions have been reviewed and explained to me by   Judeth Cornfield, CMA    I fully understand and can verbalize these instructions _____________________________ Date _________

## 2010-04-28 NOTE — Progress Notes (Signed)
Summary: NEW RX   Phone Note Call from Patient   Summary of Call: Pt needs new rx for metoprolol to go to [facility]. Ok for 50mg  1 two times a day #180?  Initial call taken by: Lamar Sprinkles, CMA,  February 25, 2010 10:08 AM  Follow-up for Phone Call        ok  Follow-up by: Jacques Navy MD,  February 25, 2010 5:21 PM    New/Updated Medications: METOPROLOL TARTRATE 50 MG TABS (METOPROLOL TARTRATE) 1 by mouth two times a day Prescriptions: METOPROLOL TARTRATE 50 MG TABS (METOPROLOL TARTRATE) 1 by mouth two times a day  #180 x 3   Entered by:   Lamar Sprinkles, CMA   Authorized by:   Jacques Navy MD   Signed by:   Lamar Sprinkles, CMA on 02/25/2010   Method used:   Faxed to ...       [facility] (mail-order)             , [place]         Ph: [phone]       Fax: [phone]   RxID:   [id number]

## 2010-04-28 NOTE — Miscellaneous (Signed)
Summary: BONE DENSITY  Clinical Lists Changes  Orders: Added new Test order of T-Bone Densitometry (77080) - Signed Added new Test order of T-Lumbar Vertebral Assessment (77082) - Signed 

## 2010-04-28 NOTE — Letter (Signed)
    [facility] [address] [place], [place]  [address] Phone: [phone]      September 20, 2009   [facility] [place] [address] [place], [place] [address]  RE:  LAB RESULTS  Dear  Ms. Sturdevant,  The following is an interpretation of your most recent lab tests.  Please take note of any instructions provided or changes to medications that have resulted from your lab work.     Bone density study reveals NORMAL bone density.   Sincerely Yours,    Jacques Navy MD

## 2010-04-28 NOTE — Progress Notes (Signed)
  Phone Note Refill Request Message from:  Fax from Pharmacy on December 09, 2009 1:32 PM  Refills Requested: Medication #1:  HYDROCHLOROTHIAZIDE 25 MG TABS 1 by mouth once daily Initial call taken by: Ami Bullins CMA,  December 09, 2009 1:33 PM    Prescriptions: HYDROCHLOROTHIAZIDE 25 MG TABS (HYDROCHLOROTHIAZIDE) 1 by mouth once daily  #90 x 3   Entered by:   Ami Bullins CMA   Authorized by:   Jacques Navy MD   Signed by:   Bill Salinas CMA on 12/09/2009   Method used:   Faxed to ...       [facility] (mail-order)             , [place]         Ph: [phone]       Fax: [phone]   RxID:   [id number]

## 2010-04-28 NOTE — Letter (Signed)
Summary: [facility]   Imported By: Lester [place] 08/26/2009 11:00:00  _____________________________________________________________________  External Attachment:    Type:   Image     Comment:   External Document

## 2010-04-28 NOTE — Progress Notes (Signed)
Summary: REFILL  Phone Note Call from Patient   Summary of Call: Patient is requesting 90 day supply of hctz. She says she was told to take 2 daily. Ok to update emr and send in new rx [facility]?  Initial call taken by: Lamar Sprinkles, CMA,  March 30, 2009 1:56 PM  Follow-up for Phone Call        updated EMR and eScribed hctz 25mg  1 once daily # 90 3 refills Follow-up by: Jacques Navy MD,  March 30, 2009 6:22 PM  Additional Follow-up for Phone Call Additional follow up Details #1::        per the patient she needs #180. Additional Follow-up by: Lucious Groves,  March 31, 2009 1:51 PM    Additional Follow-up for Phone Call Additional follow up Details #2::    dose was doubled to 25mg  - thus she needs #90 for 3 months Follow-up by: Jacques Navy MD,  March 31, 2009 4:37 PM  Additional Follow-up for Phone Call Additional follow up Details #3:: Details for Additional Follow-up Action Taken: Patient notified. Additional Follow-up by: Lucious Groves,  March 31, 2009 4:47 PM  New/Updated Medications: HYDROCHLOROTHIAZIDE 25 MG TABS (HYDROCHLOROTHIAZIDE) 1 by mouth once daily Prescriptions: HYDROCHLOROTHIAZIDE 25 MG TABS (HYDROCHLOROTHIAZIDE) 1 by mouth once daily  #90 x 3   Entered and Authorized by:   Jacques Navy MD   Signed by:   Jacques Navy MD on 03/30/2009   Method used:   [facility]  [address] [phone][facility]retail[facility]       [address] [place], [place]  [address]       Ph: [phone] or [phone]       Fax: [phone]   RxID:   [id number]

## 2010-04-28 NOTE — Letter (Signed)
Summary: Email regarding medication/Patient  Email regarding medication/Patient   Imported By: Sherian Rein 02/01/2010 09:04:21  _____________________________________________________________________  External Attachment:    Type:   Image     Comment:   External Document

## 2010-04-28 NOTE — Progress Notes (Signed)
Summary: REFILLs  Phone Note Call from Patient Call back at [facility] Phone [phone]   Summary of Call: Patient is requesting a call back. She needs some refills thru [facility] but did not given name of meds needed.  Initial call taken by: Lamar Sprinkles, CMA,  December 21, 2009 2:27 PM    Prescriptions: PREMARIN 0.3 MG TABS (ESTROGENS CONJUGATED) 1 tab daily  #90 x 2   Entered by:   Lamar Sprinkles, CMA   Authorized by:   Jacques Navy MD   Signed by:   Lamar Sprinkles, CMA on 12/22/2009   Method used:   Faxed to ...       [facility] (mail-order)             , [place]         Ph: [phone]       Fax: [phone]   RxID:   [id number] PRAVASTATIN SODIUM 40 MG  TABS (PRAVASTATIN SODIUM) once daily  #90 x 2   Entered by:   Lamar Sprinkles, CMA   Authorized by:   Jacques Navy MD   Signed by:   Lamar Sprinkles, CMA on 12/22/2009   Method used:   Faxed to ...       [facility] (mail-order)             , [place]         Ph: [phone]       Fax: [phone]   RxID:   [id number]

## 2010-04-28 NOTE — Assessment & Plan Note (Signed)
Summary: IBS/LRH    History of Present Illness Visit Type: Follow-up Visit Primary GI MD: Stan Head MD [facility] Primary [place]: [name], MD Requesting Rohin Krejci: n/a Chief Complaint: IBS with diarrhea x 10 per day History of Present Illness:   Problems for a couple of months. Urgent loose stools. She starts out normal then progressively loose. Is not disturbing her sleep. Is mainly a problem after lunch (loose stools). Not much abdominal pain but some cramps.  She foot surgery 2 months ago and took a Medrol dose pack. Is on daily cipro to supress UTI's.  Hyoscyaine was prescribed 0.375 mg two times a day without benefit (called in in late October). If she takes an old SL hyoscyamine it can slow things down to avoid defecation. she has stayed home mostly and has not had incontinence.  similar to other spells and associated with stress - mother is deteriorating and her son has lymphoma    GI Review of Systems    Reports abdominal pain.       Reports diarrhea and  irritable bowel syndrome.      Colonoscopy  Procedure date:  11/14/2006  Findings:      losis External hemorrhoids Normal colon and terminal ileum otherwise  random bxs of colon = melanosis and eosinophils  Colonoscopy  Procedure date:  05/31/2009  Findings:          1) Moderate gastritis in the antrum - a few superficial erosions     and nodules - biopsies taken FOCAL CHRONIC GASTRITIS     2) Otherwise normal examination   Current Medications (verified): 1)  Loradamed 10 Mg  Tabs (Loratadine) .... One Tab Once Daily 2)  Aspirin 81 Mg Tbec (Aspirin) .Marland Kitchen.. 1 By Mouth Once Daily 3)  Bl Multiple Vitamins   Tabs (Multiple Vitamins-Minerals) .... Once Daily 4)  Levoxyl 25 Mcg  Tabs (Levothyroxine Sodium) .... Once Daily 5)  Metoprolol Tartrate 50 Mg Tabs (Metoprolol Tartrate) .Marland Kitchen.. 1 By Mouth Two Times A Day 6)  Klor-Con M20 20 Meq  Tbcr (Potassium Chloride Crys Cr) .... Take 1 Tablet By Mouth Once A  Day 7)  Pravastatin Sodium 40 Mg  Tabs (Pravastatin Sodium) .... Once Daily 8)  Alprazolam 0.25 Mg  Tabs (Alprazolam) .... As Needed 9)  Tums 500 Mg  Chew (Calcium Carbonate Antacid) .... 2 Tabs Daily 10)  Hydrochlorothiazide 25 Mg Tabs (Hydrochlorothiazide) .Marland Kitchen.. 1 By Mouth Once Daily 11)  Flonase 50 Mcg/act  Susp (Fluticasone Propionate) .... 2 Squirts Each Nostril Daily As Needed 12)  Hydrocodone-Acetaminophen 5-325 Mg Tabs (Hydrocodone-Acetaminophen) .... As Needed For Back Pain 13)  Methocarbamol 750 Mg Tabs (Methocarbamol) .... As Needed 14)  Premarin 0.3 Mg Tabs (Estrogens Conjugated) .Marland Kitchen.. 1 Tab Daily 15)  Nortriptyline Hcl 25 Mg Caps (Nortriptyline Hcl) .Marland Kitchen.. 1 By Mouth Q Hs 16)  Levbid 0.375 Mg Xr12h-Tab (Hyoscyamine Sulfate) .... One By Mouth Two Times A Day As Needed For Pain and Diarrhea 17)  Ciprofloxacin Hcl 100 Mg Tabs (Ciprofloxacin Hcl) .Marland Kitchen.. 1 By Mouth Once Daily  Allergies (verified): 1)  Sulfa  Past History:  Past Medical History: VENOUS INSUFFICIENCY, LEGS (ICD-459.81) TARSAL TUNNEL SYNDROME (ICD-355.5) ABDOMINAL PAIN, RIGHT LOWER QUADRANT (ICD-789.03) EROSIVE GASTRITIS (ICD-535.40) OBESITY (ICD-278.00) LIVER FUNCTION TESTS, ABNORMAL, HX OF (ICD-V12.2) MENOPAUSAL SYNDROME (ICD-627.2) DIVERTICULOSIS OF COLON (ICD-562.10) EXTERNAL HEMORRHOIDS (ICD-455.3) ANXIETY (ICD-300.00) INTERSTITIAL CYSTITIS (ICD-595.1) BURSITIS, HIP (ICD-726.5) HYPOTHYROIDISM NOS (ICD-244.9) IRRITABLE BOWEL SYNDROME (ICD-564.1) MORTON'S NEUROMA (ICD-355.6) HYPERTENSION (ICD-401.9) HYPERLIPIDEMIA (ICD-272.4)  Past Surgical History: Reviewed history from 01/12/2010 and  no changes required. Hysterectomy Tonsillectomy Foot surgery Oophorectomy Left Tarsal tunnel release  Sept '11 Lestine Box)  Family History: Reviewed history from 10/29/2007 and no changes required. father - deceased @ 28: CAD/MI-fatal, HTN mother- 71: in a nursing home; breast cancer Neg- colon, DM GF - DM No FH of  Colon Cancer:  Social History: Reviewed history from 05/27/2009 and no changes required. HSG Married '57 3 sons - '60, '61, '64; grandchildren 8 (7 girls, 1 boy) lives independently with husband. Patient is a former smoker.  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   overweight-obese Eyes:  anicteric Lungs:  Clear throughout to auscultation. Heart:  Regular rate and rhythm; no murmurs, rubs,  or bruits. Abdomen:  obese, soft and non-tender without mas BS+ small reducile umblical hernia Psych:  slightly anxious   Impression & Recommendations:  Problem # 1:  IRRITABLE BOWEL SYNDROME (ICD-564.1) Assessment Deteriorated Flare of diarrhea, normal stools followed by loose is pretty classic for IBS she responds to intermittent loperamide so will try that she is on chronic cipro and though I doubt will look for C. diff/inflammation in past I had ?ed microscopic colitis with eosinophils on colon bxs (and melanosis) but she did not consistently respond to Entocort EC so probably not that   She is also stressed (mother and son)and that has been a trigger before  Also use intermittent hyoscyamine 0.125 mg SL  Problem # 2:  ANXIETY (ICD-300.00) Assessment: Deteriorated Situational stressors with mother's illness and son's diagnosis are having an effect I think she will use her Xanax more and hopefully that will help this and  IBS  Other Orders: T-C diff by PCR ([address]) T-Fecal WBC (60454-09811)  Patient Instructions: 1)  Your physician has requested that you have the following labwork done today: stool for C. diff PCR and lactoferrin looking for infection and inflammation 2)  You may do ok with a 1/2-1 Imodium AD daily so try a 1/2 tablet daily and see how you do. skip a day or two if you do not move your bowels. May increase to 1 tablet a day if needed. 3)  Your Xanax prescription may help your irritable bowel also. 4)  we will call with stool study results and any other plans. 5)  Hyoscyamine 0.125 mg was prescribed also. Stop the 0.375 milligam dose. 6)  The medication list was reviewed and reconciled.  All changed / newly prescribed medications were explained.  A complete medication list was provided to the patient / caregiver. Prescriptions: HYOSCYAMINE SULFATE 0.125 MG  SUBL (HYOSCYAMINE SULFATE) 1-2 dissolved under tongue as needed for abdominal pain  #60 x 5   Entered and Authorized by:   Iva Boop MD, [facility]   Signed by:   Iva Boop MD, [facility] on 02/28/2010  Method used:   [facility]  [address] [phone][facility]       [address] [place], [place]  [address]       Ph: [phone] or [phone]       Fax: [phone]   RxID:   [id number]

## 2010-04-28 NOTE — Progress Notes (Signed)
Summary: Lyrica  Phone Note Call from Patient Call back at [facility] Phone [phone]   Summary of Call: Pt c/o several weeks of leg tingling/discomfort at night and "bad" dreams causing trouble sleeping. She wants to try to stop the lyrica but med says consults MD first. Please advise.  Initial call taken by: Lamar Sprinkles, CMA,  May 03, 2009 10:49 AM  Follow-up for Phone Call        OK to taper off lyrica: 1 per day x 5, 1 every other day x 6 then off Follow-up by: Jacques Navy MD,  May 03, 2009 5:24 PM  Additional Follow-up for Phone Call Additional follow up Details #1::        Pt informed  Additional Follow-up by: Lamar Sprinkles, CMA,  May 03, 2009 5:46 PM

## 2010-04-28 NOTE — Progress Notes (Signed)
Summary: triage   Phone Note Call from Patient Call back at Home Phone [phone]   Caller: Patient Call For: Dr. Leone Payor Reason for Call: Talk to Nurse Summary of Call: pt reporting pain in right side abd x 5 days and getting worse... not sure if she needs to be seen by Dr. Leone Payor or "someone else" Initial call taken by: Vallarie Mare,  June 07, 2009 8:09 AM  Follow-up for Phone Call        Patient  with 5 day hx of RLQ abdominal pain.  No fever, but patient will come today and see Willette Cluster RNP at 10:30 Follow-up by: Darcey Nora RN, CGRN,  June 07, 2009 8:41 AM

## 2010-04-28 NOTE — Progress Notes (Signed)
Summary: HCTZ needs correcting  Phone Note Call from Patient Call back at [facility] Phone [phone]   Summary of Call: Patient called today stating that the prescription sent to her pharmacy was incorrect. Per the patient she is to take 2 daily of HCTZ and the pharmacy only received prescription for 1 once daily. Is it ok to fix? Initial call taken by: Lucious Groves,  March 31, 2009 1:17 PM  Follow-up for Phone Call        see other message, dose changed to 25mg  Follow-up by: Jacques Navy MD,  March 31, 2009 4:40 PM  Additional Follow-up for Phone Call Additional follow up Details #1::        duplicate message--closed note. Additional Follow-up by: Lucious Groves,  March 31, 2009 4:50 PM

## 2010-04-28 NOTE — Progress Notes (Signed)
Summary: diarrhea   Phone Note Call from Patient Call back at Home Phone [phone]   Caller: Patient Call For: Dr. Leone Payor Reason for Call: Talk to Nurse Summary of Call: persistent diarrhea... meds not helping Initial call taken by: Vallarie Mare,  February 16, 2010 8:48 AM  Follow-up for Phone Call        Patient c/o diarrhea episodes 7-8 times a day, but only happens every 2-3 days.  She is taking levbid and this has been helping.  She is advised to use imodium as needed REV scheduled for 02/28/10 1:30 with Dr Leone Payor. Darcey Nora, RN CGRN agree Iva Boop MD, [facility]  February 16, 2010 12:13 PM  Follow-up by: Selinda Michaels RN,  February 16, 2010 9:51 AM

## 2010-04-28 NOTE — Assessment & Plan Note (Signed)
Summary: discuss feet/wants to wait for men/cd   Vital Signs:  Patient profile:   77 year old female Height:      63 inches (160.02 cm) Weight:      186.50 pounds (84.77 kg) O2 Sat:      95 % on Room air Temp:     98.0 degrees F (36.67 degrees C) oral Pulse rate:   90 / minute BP sitting:   142 / 80  (left arm) Cuff size:   regular  Vitals Entered By: Bill Salinas CMA (June 14, 2009 4:33 PM) Taken by Sydell Axon SMA  O2 Flow:  Room air CC: Pt here about a referral for a podiatrist, pt also c/o tenderness in R abdomen, Pt interested in Tetanus, last mammogram 08/04/08. //[place]   Primary Care Provider:  [name], MD  CC:  Pt here about a referral for a podiatrist, pt also c/o tenderness in R abdomen, Pt interested in Tetanus, and last mammogram 08/04/08. //[place].  History of Present Illness: Feet - has a h/o paresthesia of plantar aspect and she will have knots on the feet. Diagnosed with neuromas by Norm Regal. Treated with alcohol/water injections. The pain continues. She has tenderness at the MTP joint at the 4th right digit. She also has a spasm in the foot, worse since stopping lyrica (althoug the swelling went away).   Nortriptyline has helped - she needs a 90 day prescription.   Had EGD - gastritis confirmed by biopsy. She has continued to take mobic. Will stop mobic and take APAP 1000mg  three times a day.   Had acute RLQ pain - saw Willette Cluster - referred to Dr. Yetta Barre who felt she needed hyoscamine for flare of IBS. Full lab and KUB were normal. She did take Hyoscamine. She reports that her IBS is much better since taking nortriptyline.  Allergies: 1)  Sulfa  Past History:  Past Medical History: Last updated: 06/07/2009 DIVERTICULOSIS OF COLON (ICD-562.10) EXTERNAL HEMORRHOIDS (ICD-455.3) DIARRHEA, CHRONIC (ICD-787.91) secondary to IBS vrs MICROSCOPIC COLITIS INTERSTITIAL CYSTITIS (ICD-595.1) BURSITIS, HIP (ICD-726.5) HYPOTHYROIDISM NOS  (ICD-244.9) MORTON'S NEUROMA (ICD-355.6) ARTHRITIS, CLIMACTERIC, UNSPECIFIED SITE (ICD-716.30) HYPERTENSION (ICD-401.9) HYPERLIPIDEMIA (ICD-272.4) Leg Pain (bilateral) ANXIETY (ICD-300.00) EROSIVE GASTRITIS 3/11  Past Surgical History: Last updated: 11/05/2008 Hysterectomy Tonsillectomy Foot surgery Oophorectomy  Family History: Last updated: 11-13-07 father - deceased @ 21: CAD/MI-fatal, HTN mother- 50: in a nursing home; breast cancer Neg- colon, DM GF - DM No FH of Colon Cancer:  Social History: Last updated: 05/27/2009 HSG Married '57 3 sons - '60, '61, '64; grandchildren 8 (7 girls, 1 boy) lives independently with husband. Patient is a former smoker.  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  The patient denies anorexia, fever, weight loss, chest pain, dyspnea on exertion, prolonged cough, abdominal pain, severe indigestion/heartburn, and abnormal bleeding.    Physical Exam  General:  Well-developed,well-nourished,in no acute distress; alert,appropriate and cooperative throughout examination Lungs:  normal respiratory effort and normal breath sounds.   Heart:  normal rate and regular rhythm.   Abdomen:  soft and normal bowel sounds.   Msk:  very tender at the 3rd MTP joint right and left with no palpable mass or nodule.  Impression & Recommendations:  Problem # 1:  FOOT PAIN, BILATERAL (ICD-729.5)  probable OA at MTP joints both feet.   Plan - depo joint injections - tolerated well with relief of pain.   Orders: Joint Aspirate / Injection, Small (66440) Depo-Medrol 20mg  (J1020)  Problem # 2:  EROSIVE GASTRITIS (ICD-535.40) Gastritis by EGd.  Plan -  continue PPI therapy for at least a month then consider changing to H2 therapy           stop NSAIDs (melosicam) and substitute APAP 1000mg  three times a day   Her updated medication list for this problem includes:    Tums 500 Mg Chew (Calcium carbonate antacid) .Marland Kitchen... 2 tabs daily    Nexium 40 Mg Cpdr (Esomeprazole magnesium) .Marland Kitchen... Take 1 capsule daily 30 min before breakfast    Levbid 0.375 Mg Xr12h-tab (Hyoscyamine sulfate) ..... One by mouth two times a day as needed for pain  Complete Medication List: 1)  Loradamed 10 Mg Tabs (Loratadine) .... One tab once daily 2)  Adult Aspirin Low Strength 81 Mg Tbdp (Aspirin) .... Once daily 3)  Bl Multiple Vitamins Tabs (Multiple vitamins-minerals) .... Once daily 4)  Levoxyl 25 Mcg Tabs (Levothyroxine sodium) .... Once daily 5)  Metoprolol Tartrate 25 Mg Tabs (Metoprolol tartrate) .Marland Kitchen.. 1 by mouth two times a day. 6)  Klor-con M20 20 Meq Tbcr (Potassium chloride crys cr) .... Take 1 tablet by mouth once a day 7)  Pravastatin Sodium 40 Mg Tabs (Pravastatin sodium) .... Once daily 8)  Alprazolam 0.25 Mg Tabs (Alprazolam) .... As needed 9)  Tums 500 Mg Chew (Calcium carbonate antacid) .... 2 tabs daily 10)  Hydrochlorothiazide 25 Mg Tabs (Hydrochlorothiazide) .Marland Kitchen.. 1 by mouth once daily 11)  Flonase 50 Mcg/act Susp (Fluticasone propionate) .... 2 squirts each nostril daily as needed 12)  Mobic 15 Mg Tabs (Meloxicam) .Marland Kitchen.. 1 once daily 13)  Hydrocodone-acetaminophen 5-325 Mg Tabs (Hydrocodone-acetaminophen) .... As needed for back pain 14)  Methocarbamol 750 Mg Tabs (Methocarbamol) .... As needed 15)  Premarin 0.3 Mg Tabs (Estrogens conjugated) .Marland Kitchen.. 1 tab daily 16)  Nexium 40 Mg Cpdr (Esomeprazole magnesium) .... Take 1 capsule daily 30 min before breakfast 17)  Nortriptyline Hcl 25 Mg Caps (Nortriptyline hcl) .Marland Kitchen.. 1 by mouth q hs 18)  Levbid 0.375 Mg Xr12h-tab (Hyoscyamine sulfate) .... One by mouth two times a day as needed for pain  Patient  Instructions: 1)  Gastritis - Continue Nexium for at least a month and then if pain is better can switch back to ranitidine. Try getting along without mobic - take tylenol 1000mg  three times a day.  2)  Foot pain - looks like arthritis in the joint between toe and foot bone.  Prescriptions: NORTRIPTYLINE HCL 25 MG CAPS (NORTRIPTYLINE HCL) 1 by mouth q hs  #90 x 3   Entered and Authorized by:   Jacques Navy MD   Signed by:   Jacques Navy MD on 06/14/2009   Method used:   [facility]  [address] [phone][facility]       [address]       [place], [place]  [address]       Ph: [phone] or [phone]       Fax: [phone]   RxID:   [id number]   Preventive Care Screening  Last Flu Shot:    Date:  01/25/2009    Results:  given   Mammogram:    Date:  08/04/2008    Results:  normal    Procedure Note  Injections: The patient complains of pain. Indication: acute pain  Procedure # 1: joint injection    Location: 3rd MTP right     Technique: #30g needle    Medication: 20 mg depomedrol    Anesthesia: 2% xylocain  Procedure # 2: joint injection    Location: 3rd MTP left    Technique: #30g needle    Medication: 20 mg depomedrol    Anesthesia: 2% xylocain    Comment: informed verbal consent  Cleaned and prepped with: betadine Additional Instructions: routine precautions post injection

## 2010-04-28 NOTE — Assessment & Plan Note (Signed)
Summary: rlq abdominal pain/sheri   History of Present Illness Visit Type: Follow-up Visit Primary GI MD: Stan Head MD [facility] Primary Provider: [name], MD Requesting Provider: n/a Chief Complaint: RLQ pain for 5 days, constant nagging pain History of Present Illness:   Patient had EGD several days ago for epigastric pain, found to have gastritis. She is here now with mid right abdominal pain described as constant aching. This is a new pain. BMs are fine, taking Metamucil. Pain worsened over weekend, she had to take pain medication. Pain not related to eating or  movement / actitvity.  No dysuria. No fevers. She is worried about diverticulitis.   GI Review of Systems    Reports abdominal pain, bloating, loss of appetite, and  nausea.     Location of  Abdominal pain: right mid.    Denies acid reflux, belching, chest pain, dysphagia with liquids, dysphagia with solids, heartburn, vomiting, vomiting blood, weight loss, and  weight gain.        Denies anal fissure, black tarry stools, change in bowel habit, constipation, diarrhea, diverticulosis, fecal incontinence, heme positive stool, hemorrhoids, irritable bowel syndrome, jaundice, light color stool, liver problems, rectal bleeding, and  rectal pain.   Current Medications (verified): 1)  Loradamed 10 Mg  Tabs (Loratadine) .... One Tab Once Daily 2)  Adult Aspirin Low Strength 81 Mg  Tbdp (Aspirin) .... Once Daily 3)  Bl Multiple Vitamins   Tabs (Multiple Vitamins-Minerals) .... Once Daily 4)  Levoxyl 25 Mcg  Tabs (Levothyroxine Sodium) .... Once Daily 5)  Metoprolol Tartrate 25 Mg  Tabs (Metoprolol Tartrate) .Marland Kitchen.. 1 By Mouth Two Times A Day. 6)  Klor-Con M20 20 Meq  Tbcr (Potassium Chloride Crys Cr) .... Take 1 Tablet By Mouth Once A Day 7)  Pravastatin Sodium 40 Mg  Tabs (Pravastatin Sodium) .... Once Daily 8)  Alprazolam 0.25 Mg  Tabs (Alprazolam) .... As Needed 9)  Tums 500 Mg  Chew (Calcium Carbonate Antacid) .... 2 Tabs  Daily 10)  Hydrochlorothiazide 25 Mg Tabs (Hydrochlorothiazide) .Marland Kitchen.. 1 By Mouth Once Daily 11)  Flonase 50 Mcg/act  Susp (Fluticasone Propionate) .... 2 Squirts Each Nostril Daily As Needed 12)  Mobic 15 Mg Tabs (Meloxicam) .Marland Kitchen.. 1 Once Daily 13)  Hydrocodone-Acetaminophen 5-325 Mg Tabs (Hydrocodone-Acetaminophen) .... As Needed For Back Pain 14)  Methocarbamol 750 Mg Tabs (Methocarbamol) .... As Needed 15)  Premarin 0.3 Mg Tabs (Estrogens Conjugated) .Marland Kitchen.. 1 Tab Daily 16)  Nexium 40 Mg Cpdr (Esomeprazole Magnesium) .... Take 1 Capsule Daily 30 Min Before Breakfast 17)  Nortriptyline Hcl 25 Mg Caps (Nortriptyline Hcl) .Marland Kitchen.. 1 By Mouth Q Hs  Allergies (verified): 1)  Sulfa  Past History:  Past Medical History: DIVERTICULOSIS OF COLON (ICD-562.10) EXTERNAL HEMORRHOIDS (ICD-455.3) DIARRHEA, CHRONIC (ICD-787.91) secondary to IBS vrs MICROSCOPIC COLITIS INTERSTITIAL CYSTITIS (ICD-595.1) BURSITIS, HIP (ICD-726.5) HYPOTHYROIDISM NOS (ICD-244.9) MORTON'S NEUROMA (ICD-355.6) ARTHRITIS, CLIMACTERIC, UNSPECIFIED SITE (ICD-716.30) HYPERTENSION (ICD-401.9) HYPERLIPIDEMIA (ICD-272.4) Leg Pain (bilateral) ANXIETY (ICD-300.00) EROSIVE GASTRITIS 3/11  Past Surgical History: Reviewed history from 11/05/2008 and no changes required. Hysterectomy Tonsillectomy Foot surgery Oophorectomy  Family History: Reviewed history from 10/29/2007 and no changes required. father - deceased @ 64: CAD/MI-fatal, HTN mother- 52: in a nursing home; breast cancer Neg- colon, DM GF - DM No FH of Colon Cancer:  Social History: Reviewed history from 05/27/2009 and no changes required. HSG Married '57 3 sons - '60, '61, '64; grandchildren 8 (7 girls, 1 boy) lives independently with husband. Patient is a former smoker.  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  The patient denies allergy/sinus, anemia, anxiety-new, blood in urine, breast changes/lumps, change in vision, confusion, cough, coughing up blood, depression-new, fainting, fatigue, fever, headaches-new, hearing problems, heart murmur, heart rhythm changes, itching, menstrual pain, muscle pains/cramps, night sweats, nosebleeds, pregnancy symptoms, shortness of breath, skin rash, sleeping problems, sore throat, swelling of feet/legs, swollen lymph glands, thirst - excessive , urination - excessive , urination changes/pain, urine leakage, vision changes, and voice change.    Vital Signs:  Patient profile:   77 year old female Height:      63 inches Weight:      187 pounds BMI:     33.25 Pulse rate:   84 / minute Pulse rhythm:   regular BP sitting:   140 / 80  (left arm) Cuff size:   regular  Vitals Entered By: June McMurray CMA Duncan Dull) (June 07, 2009 10:29 AM)  Physical Exam  General:  Well developed, well nourished, no acute distress. Lungs:  Clear throughout to auscultation. Abdomen:  Soft, obese.  There is a very localized, superficial area of tenderness 4-5 inches left of umbilicus. Small, reducible umbilical hernia.  Neurologic:  Alert and  oriented x4;  grossly normal neurologically. Psych:  Alert and cooperative. Normal mood and affect.  Impression & Recommendations:  Problem # 1:  ABDOMINAL WALL PAIN (ICD-789.09) Assessment Comment Only Several day history of superficial right mid abdominal pain.  On exam she has very localized, superficial area of tenderness 4-5 inches to the left of umbilicus. Complains of skin feeling sore in that area. No associated bowel habit changes. History and exam points to abdominal wall pain. I would like patient to be evaluated by her PCP. Perhaps local injection of anesthetic would be of help. I checked on Lidoderm patches but they are very expensive, even with insurance coverage.  Patient  Instructions: 1)  We have made you an appointment to see Dr. Sanda Linger today , 3-14 at 3:30Pm.  2)  We will call in the Lidoderm patches to [facility] if you cannot get the pain injection today at Dr. Yetta Barre Office.

## 2010-04-28 NOTE — Progress Notes (Signed)
Summary: REFILL - Lyrica  Phone Note Refill Request   Refills Requested: Medication #1:  LYRICA 50 MG CAPS 1 by mouth two times a day Patient is requesting a call when complete  Initial call taken by: Lamar Sprinkles, CMA,  April 19, 2009 8:52 AM  Follow-up for Phone Call        OK to refill prn Follow-up by: Jacques Navy MD,  April 19, 2009 9:27 AM    Prescriptions: LYRICA 50 MG CAPS (PREGABALIN) 1 by mouth two times a day  #60 x 3   Entered by:   Ami Bullins CMA   Authorized by:   Jacques Navy MD   Signed by:   Bill Salinas CMA on 04/19/2009   Method used:   Telephoned to ...       [facility]  [address] [phone][facility]ail[facility]       [address]       [place], [place]  [address]       Ph: [phone] or [phone]       Fax: [phone]   RxID:   [id number]

## 2010-04-28 NOTE — Assessment & Plan Note (Signed)
Summary: ?sinus inf/cd   Vital Signs:  Patient profile:   77 year old female Height:      63 inches Weight:      182 pounds BMI:     32.36 O2 Sat:      96 % on Room air Temp:     97.9 degrees F oral Pulse rate:   86 / minute BP sitting:   142 / 84  (left arm) Cuff size:   regular  Vitals Entered By: Bill Salinas CMA (March 29, 2010 2:09 PM)  O2 Flow:  Room air CC: pt here with c/o itching in her left eye/ ab   Primary Care Provider:  [name], MD  CC:  pt here with c/o itching in her left eye/ ab.  History of Present Illness: [name] presents with the complaint of a very itchy right eye for several days that is not responding to oral claritin, refresh eye drops or warm compresses. She denies any mucus discharge, change in vision, pain in the eye. She has a mild cough but no signs of any associated respiratory symptoms.  She does report that she is having increased pain in the right leg at the buttock and radiating to the thigh.  Current Medications (verified): 1)  Loradamed 10 Mg  Tabs (Loratadine) .... One Tab Once Daily 2)  Aspirin 81 Mg Tbec (Aspirin) .Marland Kitchen.. 1 By Mouth Once Daily 3)  Bl Multiple Vitamins   Tabs (Multiple Vitamins-Minerals) .... Once Daily 4)  Levoxyl 25 Mcg  Tabs (Levothyroxine Sodium) .... Once Daily 5)  Metoprolol Tartrate 50 Mg Tabs (Metoprolol Tartrate) .Marland Kitchen.. 1 By Mouth Two Times A Day 6)  Klor-Con M20 20 Meq  Tbcr (Potassium Chloride Crys Cr) .... Take 1 Tablet By Mouth Once A Day 7)  Pravastatin Sodium 40 Mg  Tabs (Pravastatin Sodium) .... Once Daily 8)  Alprazolam 0.25 Mg  Tabs (Alprazolam) .... As Needed 9)  Tums 500 Mg  Chew (Calcium Carbonate Antacid) .... 2 Tabs Daily 10)  Hydrochlorothiazide 25 Mg Tabs (Hydrochlorothiazide) .Marland Kitchen.. 1 By Mouth Once Daily 11)  Flonase 50 Mcg/act  Susp (Fluticasone Propionate) .... 2 Squirts Each Nostril Daily As Needed 12)  Hydrocodone-Acetaminophen 5-325 Mg Tabs (Hydrocodone-Acetaminophen) .... As Needed  For Back Pain 13)  Methocarbamol 750 Mg Tabs (Methocarbamol) .... As Needed 14)  Premarin 0.3 Mg Tabs (Estrogens Conjugated) .Marland Kitchen.. 1 Tab Daily 15)  Nortriptyline Hcl 25 Mg Caps (Nortriptyline Hcl) .Marland Kitchen.. 1 By Mouth Q Hs 16)  Ciprofloxacin Hcl 100 Mg Tabs (Ciprofloxacin Hcl) .Marland Kitchen.. 1 By Mouth Once Daily 17)  Imodium A-D 2 Mg Tabs (Loperamide Hcl) .... 1/2-1 Tablet By Mouth Daily, Holding For Constipation 18)  Hyoscyamine Sulfate 0.125 Mg  Subl (Hyoscyamine Sulfate) .Marland Kitchen.. 1-2 Dissolved Under Tongue As Needed For Abdominal Pain  Allergies (verified): 1)  Sulfa  Past History:  Past Medical History: Last updated: 02/28/2010 VENOUS INSUFFICIENCY, LEGS (ICD-459.81) TARSAL TUNNEL SYNDROME (ICD-355.5) ABDOMINAL PAIN, RIGHT LOWER QUADRANT (ICD-789.03) EROSIVE GASTRITIS (ICD-535.40) OBESITY (ICD-278.00) LIVER FUNCTION TESTS, ABNORMAL, HX OF (ICD-V12.2) MENOPAUSAL SYNDROME (ICD-627.2) DIVERTICULOSIS OF COLON (ICD-562.10) EXTERNAL HEMORRHOIDS (ICD-455.3) ANXIETY (ICD-300.00) INTERSTITIAL CYSTITIS (ICD-595.1) BURSITIS, HIP (ICD-726.5) HYPOTHYROIDISM NOS (ICD-244.9) IRRITABLE BOWEL SYNDROME (ICD-564.1) MORTON'S NEUROMA (ICD-355.6) HYPERTENSION (ICD-401.9) HYPERLIPIDEMIA (ICD-272.4)  Past Surgical History: Last updated: 01/12/2010 Hysterectomy Tonsillectomy Foot surgery Oophorectomy Left Tarsal tunnel release  Sept '11 Lestine Box) FH reviewed for relevance, SH/Risk Factors reviewed for relevance  Review of Systems       The patient complains of weight gain, dyspnea on exertion,  and difficulty walking.  The patient denies anorexia, fever, weight loss, vision loss, hoarseness, chest pain, headaches, abdominal pain, incontinence, muscle weakness, depression, abnormal bleeding, and angioedema.    Physical Exam  General:  overweight white woman in no distress Head:  normocephalic and atraumatic.  No tenderness to percussion over the frontal sinus Eyes:  vision grossly intact, pupils equal, and  pupils round.  C&S clear without exudate, injection, hemorrhage  Lungs:  normal respiratory effort and normal breath sounds.   Heart:  normal rate and regular rhythm.   Msk:  no marked tenderness with internal and external rotation of the hips. Tender to palpation of the lower buttock, of the vastus medialis  and across the sartorius. Pulses:  2+ radial Neurologic:  alert & oriented X3 and cranial nerves II-XII intact.   Skin:  turgor normal and color normal.   Psych:  Oriented X3, memory intact for recent and remote, normally interactive, and good eye contact.     Impression & Recommendations:  Problem # 1:  CONJUNCTIVITIS, ALLERGIC, ACUTE (ICD-372.05) Eye exam is normal  Plan - cromolyn soln 2 qtts to OD qid for symptoms  Problem # 2:  MUSCLE STRAIN, RIGHT BUTTOCK (ICD-848.8) Probable piriformis syndrom and inflammation of quadraceps secondary to DJD right hip  Plan - stretch exercises - referred to YouTube for video instruction. If not helpful will refer to PT.  Complete Medication List: 1)  Loradamed 10 Mg Tabs (Loratadine) .... One tab once daily 2)  Aspirin 81 Mg Tbec (Aspirin) .Marland Kitchen.. 1 by mouth once daily 3)  Bl Multiple Vitamins Tabs (Multiple vitamins-minerals) .... Once daily 4)  Levoxyl 25 Mcg Tabs (Levothyroxine sodium) .... Once daily 5)  Metoprolol Tartrate 50 Mg Tabs (Metoprolol tartrate) .Marland Kitchen.. 1 by mouth two times a day 6)  Klor-con M20 20 Meq Tbcr (Potassium chloride crys cr) .... Take 1 tablet by mouth once a day 7)  Pravastatin Sodium 40 Mg Tabs (Pravastatin sodium) .... Once daily 8)  Alprazolam 0.25 Mg Tabs (Alprazolam) .... As needed 9)  Tums 500 Mg Chew (Calcium carbonate antacid) .... 2 tabs daily 10)  Hydrochlorothiazide 25 Mg Tabs (Hydrochlorothiazide) .Marland Kitchen.. 1 by mouth once daily 11)  Flonase 50 Mcg/act Susp (Fluticasone propionate) .... 2 squirts each nostril daily as needed 12)  Hydrocodone-acetaminophen 5-325 Mg Tabs (Hydrocodone-acetaminophen) .... As  needed for back pain 13)  Methocarbamol 750 Mg Tabs (Methocarbamol) .... As needed 14)  Premarin 0.3 Mg Tabs (Estrogens conjugated) .Marland Kitchen.. 1 tab daily 15)  Nortriptyline Hcl 25 Mg Caps (Nortriptyline hcl) .Marland Kitchen.. 1 by mouth q hs 16)  Ciprofloxacin Hcl 100 Mg Tabs (Ciprofloxacin hcl) .Marland Kitchen.. 1 by mouth once daily 17)  Imodium A-d 2 Mg Tabs (Loperamide hcl) .... 1/2-1 tablet by mouth daily, holding for constipation 18)  Hyoscyamine Sulfate 0.125 Mg Subl (Hyoscyamine sulfate) .Marland Kitchen.. 1-2 dissolved under tongue as needed for abdominal pain 19)  Cromolyn Sodium 4 % Soln (Cromolyn sodium) .... 2 qtts right eye 4 times a day Prescriptions: ALPRAZOLAM 0.25 MG  TABS (ALPRAZOLAM) as needed  #100 x 1   Entered by:   Ami Bullins CMA   Authorized by:   Jacques Navy MD   Signed by:   Bill Salinas CMA on 03/29/2010   Method used:   Telephoned to ...       [facility]  [address] [phone][facility] ([facility]ail)       [address]       [place],  [place]  [address]       Ph: [phone] or [phone]       Fax: [phone]   RxID:   [id number] CROMOLYN SODIUM 4 % SOLN (CROMOLYN SODIUM) 2 qtts right eye 4 times a day  #10cc x 2   Entered and Authorized by:   Jacques Navy MD   Signed by:   Jacques Navy MD on 03/29/2010   Method used:   [facility]ronically to        [facility]  [address] [phone]* (retail)       [address] [place], [place]  [address]       Ph: [phone] or [phone]       Fax: [phone]   RxID:   [id number]    Orders Added: 1)  Est. Patient Level III [20254]

## 2010-04-28 NOTE — Consult Note (Signed)
Summary: [facility]   Imported By: Sherian Rein 11/16/2009 08:03:34  _____________________________________________________________________  External Attachment:    Type:   Image     Comment:   External Document

## 2010-04-28 NOTE — Progress Notes (Signed)
Summary: Gabapentin  Phone Note Call from Patient Call back at [facility] Phone [phone]   Summary of Call: Pt was on gabapentin and then it was stopped. She was told to resume at 100mg  once daily. Patient is requesting rx for same.  Initial call taken by: Lamar Sprinkles, CMA,  November 23, 2009 9:33 AM  Follow-up for Phone Call        ok for gabapentin 100mg  once daily #30, refill prn Follow-up by: Jacques Navy MD,  November 23, 2009 11:35 AM  Additional Follow-up for Phone Call Additional follow up Details #1::        Patient aware. Additional Follow-up by: Lucious Groves CMA,  November 23, 2009 1:12 PM    New/Updated Medications: GABAPENTIN 100 MG CAPS (GABAPENTIN) 1 once daily Prescriptions: GABAPENTIN 100 MG CAPS (GABAPENTIN) 1 once daily  #90 x 3   Entered by:   Lamar Sprinkles, CMA   Authorized by:   Jacques Navy MD   Signed by:   Lamar Sprinkles, CMA on 11/23/2009   Method used:   [facility]        [facility]  [address] [phone][facility] ([facility]ail[facility]       [address] [place], [place]  [address]       Ph: [phone] or [phone]       Fax: [phone]   RxID:   [id number]

## 2010-04-28 NOTE — Op Note (Signed)
Summary: Trigger point injection/[facility]  Trigger point injection/[facility]   Imported By: Lester Dryville 11/12/2009 10:19:33  _____________________________________________________________________  External Attachment:    Type:   Image     Comment:   External Document

## 2010-04-28 NOTE — Progress Notes (Signed)
Summary: REFILL   Phone Note Call from Patient Call back at [facility] Phone [phone]   Summary of Call: Patient is requesting a call back, needs a refill but did not give name of med or pharm.  Initial call taken by: Lamar Sprinkles, CMA,  December 09, 2009 2:00 PM  Follow-up for Phone Call        Pt needs levoxyl to go to [facility]. [facility] for 90 day supply w/3rfs?  Follow-up by: Lamar Sprinkles, CMA,  December 09, 2009 3:39 PM  Additional Follow-up for Phone Call Additional follow up Details #1::        ok for 90 days x 3 refills Additional Follow-up by: Jacques Navy MD,  December 09, 2009 4:04 PM    Prescriptions: LEVOXYL 25 MCG  TABS (LEVOTHYROXINE SODIUM) once daily  #90 x 3   Entered by:   Lamar Sprinkles, CMA   Authorized by:   Jacques Navy MD   Signed by:   Lamar Sprinkles, CMA on 12/09/2009   Method used:   Faxed to ...       [facility] (mail-order)             , [place]         Ph: [phone]       Fax: [phone]   RxID:   [id number]

## 2010-04-28 NOTE — Assessment & Plan Note (Signed)
Summary: LEG PAIN/NWS   Vital Signs:  Patient profile:   77 year old female Height:      63 inches Weight:      189 pounds BMI:     33.60 O2 Sat:      97 % on Room air Temp:     98.4 degrees F oral Pulse rate:   75 / minute BP sitting:   146 / 80  (left arm) Cuff size:   regular  Vitals Entered By: Bill Salinas CMA (May 10, 2009 9:48 AM)  O2 Flow:  Room air CC: pt here with complaint of worsening tingling sensation in both her feet and legs, pt states symptoms have been present x 2 years but progressively getting worse/ab   Primary Care Provider:  [name], MD  CC:  pt here with complaint of worsening tingling sensation in both her feet and legs and pt states symptoms have been present x 2 years but progressively getting worse/ab.  History of Present Illness: Patient presents for on-going leg pain: icy-cold feet and at night burnig feet. Chart reviewed: she has had EMG,NCS studies - results not available. She has seen Dr. Ethelene Hal for injections. She has seen Dr. Newell Coral - no NS problem. she is followed by Dr. Madelon Lips. She sees Dr. Alvester Morin who does ESI last Dec '10. She has also had nerve ablation.  She has had varicose vein treatment by Dr. Raechel Chute. None of this has helped her legs. she has tried gabapentin - intolerant, lyrica - intolerant with bad dreams, nortriptyline that was tolerated but stopped. She does get relief with Vicodin.   Current Medications (verified): 1)  Loradamed 10 Mg  Tabs (Loratadine) .... One Tab Once Daily 2)  Zantac 150 Mg  Caps (Ranitidine Hcl) .... Once Daily 3)  Adult Aspirin Low Strength 81 Mg  Tbdp (Aspirin) .... Once Daily 4)  Bl Multiple Vitamins   Tabs (Multiple Vitamins-Minerals) .... Once Daily 5)  Levoxyl 25 Mcg  Tabs (Levothyroxine Sodium) .... Once Daily 6)  Metoprolol Tartrate 25 Mg  Tabs (Metoprolol Tartrate) .Marland Kitchen.. 1 By Mouth Two Times A Day. 7)  Klor-Con M20 20 Meq  Tbcr (Potassium Chloride Crys Cr) .... Take 1 Tablet By Mouth Once A  Day 8)  Pravastatin Sodium 40 Mg  Tabs (Pravastatin Sodium) .... Once Daily 9)  Alprazolam 0.25 Mg  Tabs (Alprazolam) .... As Needed 10)  Tums 500 Mg  Chew (Calcium Carbonate Antacid) .... 2 Tabs Daily 11)  Hydrochlorothiazide 25 Mg Tabs (Hydrochlorothiazide) .Marland Kitchen.. 1 By Mouth Once Daily 12)  Flonase 50 Mcg/act  Susp (Fluticasone Propionate) .... 2 Squirts Each Nostril Daily As Needed 13)  Lomotil 2.5-0.025 Mg  Tabs (Diphenoxylate-Atropine) .Marland Kitchen.. 1-2 Every 6 Hrs As Needed Diarrhea 14)  Mobic 15 Mg Tabs (On Hold) (Meloxicam) .... Take 1 Tablet By Mouth Once A Day 15)  Hydrocodone-Acetaminophen 5-325 Mg Tabs (Hydrocodone-Acetaminophen) .... As Needed For Back Pain 16)  Methocarbamol 750 Mg Tabs (Methocarbamol) .... As Needed 17)  Lyrica 50 Mg Caps (Pregabalin) .Marland Kitchen.. 1 By Mouth Two Times A Day 18)  Premarin 0.3 Mg Tabs (Estrogens Conjugated) .Marland Kitchen.. 1 Tab Daily 19)  Nexium 40 Mg Cpdr (Esomeprazole Magnesium) .... 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) BURSITIS, HIP (ICD-726.5) HYPOTHYROIDISM NOS (ICD-244.9) MORTON'S NEUROMA (ICD-355.6) ARTHRITIS, CLIMACTERIC, UNSPECIFIED  SITE (ICD-716.30) HYPERTENSION (ICD-401.9) HYPERLIPIDEMIA (ICD-272.4) Leg Pain (bilateral) ANXIETY (ICD-300.00)  Past Surgical History: Last updated: 11/05/2008 Hysterectomy Tonsillectomy Foot surgery Oophorectomy  Family History: Last updated: 11-26-2007 father - deceased @ 65: CAD/MI-fatal, HTN mother- 33: in a nursing home; breast cancer Neg- colon, DM GF - DM No FH of Colon Cancer:  Social History: Last updated: 2007/11/26 HSG Married '57 3 sons - '60, '61, '64; grandchildren 8 (7 girls, 1 boy) lives independently with  husband. Patient is a former smoker.  Alcohol Use - yes socially  not in years Illicit Drug Use - no Daily Caffeine Use  sip of coke  Risk Factors: Caffeine Use: 1 (09/09/2007)  Risk Factors: Smoking Status: quit (Nov 26, 2007)  Review of Systems  The patient denies anorexia, fever, weight loss, weight gain, decreased hearing, hoarseness, chest pain, syncope, prolonged cough, hemoptysis, abdominal pain, severe indigestion/heartburn, muscle weakness, difficulty walking, depression, and enlarged lymph nodes.    Physical Exam  General:  Well-developed,well-nourished,in no acute distress; alert,appropriate and cooperative throughout examination Head:  normocephalic and atraumatic.   Neck:  full ROM.   Lungs:  normal respiratory effort and normal breath sounds.   Heart:  normal rate, regular rhythm, and no murmur.   Msk:  normal ROM, no joint tenderness, no joint swelling, and no joint warmth.   Pulses:  2+ DP pulses Extremities:  No clubbing, cyanosis, edema, or deformity noted with normal full range of motion of all joints.   Neurologic:  alert & oriented X3, cranial nerves II-XII intact, sensation intact to light touch, and gait normal.     Impression & Recommendations:  Problem # 1:  LEG PAIN, BILATERAL (ICD-729.5) Patient with persistent leg pain. etiology is most likely neuropathic pain related to her back. Many avenues have been explored. She has had recent labs inlcuidng normal B12 and thyroid function.  Plan - rechallenge with nortriptyline.          she will be schedule ESI with Dr. Alvester Morin for her back          if nortriptyline fails will consider either pain medicine referral vs accupuncture referral.  Complete Medication List: 1)  Loradamed 10 Mg Tabs (Loratadine) .... One tab once daily 2)  Zantac 150 Mg Caps (Ranitidine hcl) .... Once daily 3)  Adult Aspirin Low Strength 81 Mg Tbdp (Aspirin) .... Once daily 4)  Bl Multiple Vitamins Tabs (Multiple vitamins-minerals)  .... Once daily 5)  Levoxyl 25 Mcg Tabs (Levothyroxine sodium) .... Once daily 6)  Metoprolol Tartrate 25 Mg Tabs (Metoprolol tartrate) .Marland Kitchen.. 1 by mouth two times a day. 7)  Klor-con M20 20 Meq Tbcr (Potassium chloride crys cr) .... Take 1 tablet by mouth once a day 8)  Pravastatin Sodium 40 Mg Tabs (Pravastatin sodium) .... Once daily 9)  Alprazolam 0.25 Mg Tabs (Alprazolam) .... As needed 10)  Tums 500 Mg Chew (Calcium carbonate antacid) .... 2 tabs daily 11)  Hydrochlorothiazide 25 Mg Tabs (Hydrochlorothiazide) .Marland Kitchen.. 1 by mouth once daily 12)  Flonase 50 Mcg/act Susp (Fluticasone propionate) .... 2 squirts each nostril daily as needed 13)  Lomotil 2.5-0.025 Mg Tabs (Diphenoxylate-atropine) .Marland Kitchen.. 1-2 every 6 hrs as needed diarrhea 14)  Mobic 15 Mg Tabs (on Hold) (meloxicam)  .... Take 1 tablet by mouth once a day 15)  Hydrocodone-acetaminophen 5-325 Mg Tabs (Hydrocodone-acetaminophen) .... As needed for back pain 16)  Methocarbamol 750 Mg Tabs (Methocarbamol) .... As needed 17)  Lyrica 50 Mg Caps (Pregabalin) .Marland Kitchen.. 1 by mouth two times a day 18)  Premarin 0.3 Mg Tabs (Estrogens conjugated) .Marland Kitchen.. 1 tab daily 19)  Nexium 40 Mg Cpdr (Esomeprazole magnesium) .... Take 1 capsule daily 30 min before breakfast   Immunization History:  Zostavax History:    Zostavax # 1:  zostavax (06/26/2007)

## 2010-04-28 NOTE — Letter (Signed)
Summary: [facility]   Imported By: Lester Stratford 11/12/2009 10:21:46  _____________________________________________________________________  External Attachment:    Type:   Image     Comment:   External Document

## 2010-04-28 NOTE — Letter (Signed)
Summary: NP Consult/[facility]  NP Consult/[facility]   Imported By: Sherian Rein 01/21/2010 11:49:48  _____________________________________________________________________  External Attachment:    Type:   Image     Comment:   External Document

## 2010-04-28 NOTE — Procedures (Signed)
Summary: Upper Endoscopy  Patient: [name] Note: All result statuses are Final unless otherwise noted.  Tests: (1) Upper Endoscopy (EGD)   EGD Upper Endoscopy       DONE     [facility]     [address]     [place], [place]  [address]           ENDOSCOPY PROCEDURE REPORT           PATIENT:  Katherine, Best  MR#:  [id number]     BIRTHDATE:  [birth year], 75 yrs. old  GENDER:  female           ENDOSCOPIST:  Iva Boop, MD, [facility]           PROCEDURE DATE:  05/31/2009     PROCEDURE:  EGD with biopsy     ASA CLASS:  Class II     INDICATIONS:  epigastric pain on NSAID's (and PPI), and also with     recent situational stressors           MEDICATIONS:   Fentanyl 50 mcg IV, Versed 7 mg IV     TOPICAL ANESTHETIC:  Exactacain Spray           DESCRIPTION OF PROCEDURE:   After the risks benefits and     alternatives of the procedure were thoroughly explained, informed     consent was obtained.  The [facility] [id number] endoscope was     introduced through the mouth and advanced to the second portion of     the duodenum, without limitations.  The instrument was slowly     withdrawn as the mucosa was fully examined.     <<PROCEDUREIMAGES>>           Moderate gastritis was found in the antrum. Erythema, erosions     9superficial) and nodularity in the antrum. Multiple biopsies were     obtained and sent to pathology.  Otherwise the examination was     normal.    Retroflexed views revealed no abnormalities.    The     scope was then withdrawn from the patient and the procedure     completed.           COMPLICATIONS:  None           ENDOSCOPIC IMPRESSION:     1) Moderate gastritis in the antrum - a few superficial erosions     and nodules - biopsies taken     2) Otherwise normal examination     RECOMMENDATIONS:     Continue Nexium and NSAID's for now.     Some of the pain is thought to be functional and may respond to     anti-spasmodics or anxiolytics but await  biopsy results.           REPEAT EXAM:  In for as needed.           Iva Boop, MD, [facility]           CC:  Jacques Navy, MD     The Patient           n.     Rosalie Doctor:   Iva Boop at 05/31/2009 03:19 PM           Katherine Best, [id number]  Note: An exclamation mark (!) indicates a result that was not dispersed into the flowsheet. Document Creation Date: 05/31/2009 3:19 PM _______________________________________________________________________  (1) Order result status: Final Collection or observation  date-time: 05/31/2009 15:10 Requested date-time:  Receipt date-time:  Reported date-time:  Referring Physician:   Ordering Physician: Stan Head [phone]) Specimen Source:  Source: Launa Grill Order Number: [id number] Lab site:   Appended Document: Upper Endoscopy she raised ? of increasing nortriptyline as well as if she was on "too many meds" not taking ranitidine while on Nexium

## 2010-04-28 NOTE — Progress Notes (Signed)
Summary: Triage   Phone Note Call from Patient Call back at Home Phone [phone]   Caller: Patient Call For: Dr. Leone Payor Reason for Call: Talk to Nurse Summary of Call: Still having diarrhea...wants to know if she can try something else Initial call taken by: Karna Christmas,  March 16, 2010 3:01 PM  Follow-up for Phone Call        Patient is doing some traveling over the holidays.  She says has one loose BM in the afternoon.  She wanted to know if she could take her hyoscyamine while she is traveling.  PT is advised she can take it before meals if needed.  She will call back for further questions.   Follow-up by: Darcey Nora RN, CGRN,  March 16, 2010 3:11 PM

## 2010-04-28 NOTE — Assessment & Plan Note (Signed)
Summary: FLU SHOT/MEN/PN   Nurse Visit   Allergies: 1)  Sulfa  Orders Added: 1)  Flu Vaccine 51yrs + [90658] 2)  Administration Flu vaccine - MCR [G0008]      Flu Vaccine Consent Questions     Do you have a history of severe allergic reactions to this vaccine? no    Any prior history of allergic reactions to egg and/or gelatin? no    Do you have a sensitivity to the preservative Thimersol? no    Do you have a past history of Guillan-Barre Syndrome? no    Do you currently have an acute febrile illness? no    Have you ever had a severe reaction to latex? no    Vaccine information given and explained to patient? yes    Are you currently pregnant? no    Lot Number:[id number]   Exp Date:09/23/2009   Site Given  Left Deltoid IMu

## 2010-04-28 NOTE — Progress Notes (Signed)
Summary: lactoferrin and C diff negative = IBS   Phone Note Outgoing Call   Summary of Call: let her know no C. diff and no signs of inflammation ptroblem is IBS stick with plan Iva Boop MD, [facility]  March 04, 2010 9:12 AM   Follow-up for Phone Call        pt notifed of above and is agreeable. Follow-up by: Francee Piccolo CMA Duncan Dull),  March 04, 2010 2:32 PM

## 2010-04-28 NOTE — Assessment & Plan Note (Signed)
Summary: Abd pain   History of Present Illness Visit Type: Follow-up Visit Primary GI MD: Stan Head MD [facility] Primary Provider: [name], MD Requesting Provider: n/a Chief Complaint: pain in epigastric area after eating.  Pt was given AcipHex by Dr. Debby Bud which worked "wonders", pt has run out of samples.  Pt has not resumed Mobic, which was held by Dr. Debby Bud History of Present Illness:   Patient followed by Dr. Leone Payor for chronic diarrhea, possibly secondary to microscopic colitis. Her BMs are normal.  Patient is worked in for several day history of post-prandial epigastric pain described as hunger pains. No nausea. Saw Dr. Debby Bud and started on Aciphex for probable dyspepsia. Not 100% but better but significantly improved. On chronic Zantac "to protect stomach". No GERD symptoms. BMs normal. No black stool. Takes a daily Baby ASA. Rarely takes Indocin. Takes Mobic but placed on hold per Dr. Debby Bud.    On chronic Cipro for UTIs.      GI Review of Systems    Reports abdominal pain.     Location of  Abdominal pain: epigastric area.    Denies acid reflux, belching, bloating, chest pain, dysphagia with liquids, dysphagia with solids, heartburn, loss of appetite, nausea, vomiting, vomiting blood, weight loss, and  weight gain.        Denies anal fissure, black tarry stools, change in bowel habit, constipation, diarrhea, diverticulosis, fecal incontinence, heme positive stool, hemorrhoids, irritable bowel syndrome, jaundice, light color stool, liver problems, rectal bleeding, and  rectal pain.   Current Medications (verified): 1)  Loradamed 10 Mg  Tabs (Loratadine) .... One Tab Once Daily 2)  Zantac 150 Mg  Caps (Ranitidine Hcl) .... Once Daily 3)  Adult Aspirin Low Strength 81 Mg  Tbdp (Aspirin) .... Once Daily 4)  Bl Multiple Vitamins   Tabs (Multiple Vitamins-Minerals) .... Once Daily 5)  Levoxyl 25 Mcg  Tabs (Levothyroxine Sodium) .... Once Daily 6)  Metoprolol Tartrate 25 Mg   Tabs (Metoprolol Tartrate) .Marland Kitchen.. 1 By Mouth Two Times A Day. 7)  Klor-Con M20 20 Meq  Tbcr (Potassium Chloride Crys Cr) .... Take 1 Tablet By Mouth Once A Day 8)  Pravastatin Sodium 40 Mg  Tabs (Pravastatin Sodium) .... Once Daily 9)  Alprazolam 0.25 Mg  Tabs (Alprazolam) .... As Needed 10)  Tums 500 Mg  Chew (Calcium Carbonate Antacid) .... 2 Tabs Daily 11)  Hydrochlorothiazide 25 Mg Tabs (Hydrochlorothiazide) .Marland Kitchen.. 1 By Mouth Once Daily 12)  Flonase 50 Mcg/act  Susp (Fluticasone Propionate) .... 2 Squirts Each Nostril Daily As Needed 13)  Lomotil 2.5-0.025 Mg  Tabs (Diphenoxylate-Atropine) .Marland Kitchen.. 1-2 Every 6 Hrs As Needed Diarrhea 14)  Mobic 15 Mg Tabs (On Hold) (Meloxicam) .... Take 1 Tablet By Mouth Once A Day 15)  Cipro 250 Mg Tabs (Ciprofloxacin Hcl) .Marland Kitchen.. 1 Tab At Bedtime 16)  Hydrocodone-Acetaminophen 5-325 Mg Tabs (Hydrocodone-Acetaminophen) .... As Needed For Back Pain 17)  Methocarbamol 750 Mg Tabs (Methocarbamol) .... As Needed 18)  Lyrica 50 Mg Caps (Pregabalin) .Marland Kitchen.. 1 By Mouth Two Times A Day 19)  Premarin 0.3 Mg Tabs (Estrogens Conjugated) .Marland Kitchen.. 1 Tab Daily 20)  Indomethacin 25 Mg Caps  (On Hold)  (Indomethacin) .Marland Kitchen.. 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 Hysterectomy Tonsillectomy Foot surgery Oophorectomy  Family History: Reviewed history from 10/29/2007 and no changes  required. father - deceased @ 35: CAD/MI-fatal, HTN mother- 45: in a nursing home; breast cancer Neg- colon, DM GF - DM No FH of Colon Cancer:  Social History: Reviewed history from 10/29/2007 and no changes required. HSG Married '57 3 sons - '60, '61, '64; grandchildren 8 (7 girls, 1 boy) lives independently with husband. Patient is a former smoker.  Alcohol Use - yes socially  not in years Illicit Drug Use - no Daily Caffeine Use  sip of coke  Review of Systems       The patient complains of arthritis/joint pain, back pain, swelling of feet/legs, urination changes/pain, and urine leakage.  The patient denies allergy/sinus, anemia, anxiety-new, blood in urine, breast changes/lumps, confusion, cough, coughing up blood, depression-new, fainting, fatigue, fever, headaches-new, hearing problems, heart murmur, heart rhythm changes, itching, menstrual pain, muscle pains/cramps, night sweats, nosebleeds, pregnancy symptoms, shortness of breath, skin rash, sleeping problems, sore throat, swollen lymph glands, thirst - excessive, urination - excessive, vision changes, and voice change.    Vital Signs:  Patient profile:   77 year old female Height:      63 inches Weight:      185 pounds BMI:     32.89 Pulse rate:   64 / minute Pulse rhythm:   regular BP sitting:   116 / 66  (left arm) Cuff size:   regular  Vitals Entered By: Francee Piccolo CMA Duncan Dull) (March 31, 2009 9:51 AM)  Physical Exam  General:  Well developed, well nourished, no acute distress. Head:  Normocephalic and atraumatic. Eyes:  Conjunctiva pink, no icterus.  Mouth:  No oral lesions. Tongue moist.  Neck:  no obvious masses  Lungs:  Clear throughout to auscultation. Heart:  Regular rate and rhythm; no murmurs, rubs,  or bruits. Abdomen:  Abdomen soft, nontender, nondistended. No obvious masses or hepatomegaly.Normal bowel sounds.  Rectal:  Heme negative.  Extremities:  No palmar erythema, no  edema.  Neurologic:  Alert and  oriented x4;  grossly normal neurologically. Skin:  Intact without significant lesions or rashes. Cervical Nodes:  No significant cervical adenopathy. Psych:  Alert and cooperative. Normal mood and affect.   Impression & Recommendations:  Problem # 1:  ABDOMINAL PAIN, EPIGASTRIC (ICD-789.06) Assessment Improved No alarm signs. Hemoccult negative.  Cannot exclude PUD, especially given NSAID use but more likely non-ulcer dyspepsia improving on PPI. Continue am Aciphex. Can stop Zantac while on PPI. Patient has arthritic pain. Can restart Mobic in a week but needs to continue Aciphex.  Return for follow up in two months. Patient will need further evaluation (e.g. labs, imaging, endoscopy) if she has recurrent epigastric pain.  Orders: TLB-CMP (Comprehensive Metabolic Pnl) ([id number])  Problem # 2:  LIVER FUNCTION TESTS, ABNORMAL, HX OF (ICD-V12.2) Assessment: New Mildly elevated ALT in October.  LFTs normal in the past. Will recheck today.   Patient Instructions: 1)  Please go to the lab, basement level. 2)  We sent a perscription for Aciphex to [facility]. 3)  You don't need to take the Zantac unless you need to take it for "heartburn".  The Aciphex will protect your stomach. 4)  We made you an appointment with Dr.Gessner for 05-27-09 at 1:30PM to follow up.  5)  Copy  sent to : [name], MD 6)  The medication list was reviewed and reconciled.  All changed / newly prescribed medications were explained.  A complete medication list was provided to the patient / caregiver.  Prescriptions: NEXIUM 40 MG CPDR (ESOMEPRAZOLE MAGNESIUM) Take 1 capsule daily 30 min before breakfast  #30 x 3   Entered by:   Lowry Ram NCMA   Authorized by:   Willette Cluster NP   Signed by:   Lowry Ram NCMA on 03/31/2009   Method used:   [facility]ically to        [facility]  [address] [phone][facility] ([facility]ail)       [address] [place], [place]  [address]       Ph:  [phone] or [phone]       Fax: [phone]   RxID:   [id number] ACIPHEX 20 MG TBEC (RABEPRAZOLE SODIUM) Take 1 capsule 30 min prior to breakfast  #30 x 3   Entered by:   Lowry Ram NCMA   Authorized by:   Willette Cluster NP   Signed by:   Lowry Ram NCMA on 03/31/2009   Method used:   [facility]ically to        [facility]  [address] [phone][facility] (retail)       [address] [place], [place]  [address]       Ph: [phone] or [phone]       Fax: [phone]   RxID:   [id number]  For insurance reasons, we sent a perscription for Nexium to the pt's pharmacy. Pt notified.

## 2010-04-28 NOTE — Progress Notes (Signed)
Summary: Osteoporosis  Phone Note Call from Patient Call back at Home Phone [phone]   Summary of Call: Patient was told by her ortho to have Dr Debby Bud check her for osteoporosis. Please advise.  Initial call taken by: Lamar Sprinkles, CMA,  September 08, 2009 10:25 AM  Follow-up for Phone Call        absolutely....checked record: no record of DXA. Please have her schedule DXA at her convenience Follow-up by: Jacques Navy MD,  September 08, 2009 11:19 AM  Additional Follow-up for Phone Call Additional follow up Details #1::        we will need a diagnosis code please. Additional Follow-up by: Verdell Face,  September 08, 2009 1:42 PM    Additional Follow-up for Phone Call Additional follow up Details #2::    left vm for pt to call office back to confirm if she has every had dxa at another facility. Dr Debby Bud what dx, can we use? ................Marland KitchenLamar Sprinkles, CMA  September 08, 2009 2:48 PM   Additional Follow-up for Phone Call Additional follow up Details #3:: Details for Additional Follow-up Action Taken: what do we usually do?? v77.99, if not a code likely to be covered for screening for osteoporosis then 733.01..Marland KitchenMarland KitchenJacques Navy MD,  September 08, 2009 2:53 PM  Pt left vm, last bone density test was 10 years ago. Please schedule patient here and use 733.01, THANKS.......Marland KitchenLamar Sprinkles, CMA  September 08, 2009 3:48 PM   Pt set appt up for bone density for 6/16 @ 10 a.m......Marland Kitchen Additional Follow-up by: Verdell Face,  September 08, 2009 3:54 PM

## 2010-05-04 ENCOUNTER — Encounter: Payer: Self-pay | Admitting: Internal Medicine

## 2010-05-09 ENCOUNTER — Encounter: Payer: Self-pay | Admitting: Internal Medicine

## 2010-05-18 NOTE — Letter (Signed)
Summary: [facility]   Imported By: Sherian Rein 05/11/2010 08:16:27  _____________________________________________________________________  External Attachment:    Type:   Image     Comment:   External Document

## 2010-05-24 NOTE — Letter (Signed)
Summary: Sherri Rad MD/[facility]  Sherri Rad MD/[facility]   Imported By: Lester Ahuimanu 05/17/2010 07:48:46  _____________________________________________________________________  External Attachment:    Type:   Image     Comment:   External Document

## 2010-06-08 ENCOUNTER — Encounter: Payer: Self-pay | Admitting: Internal Medicine

## 2010-06-14 ENCOUNTER — Encounter: Payer: Self-pay | Admitting: Internal Medicine

## 2010-06-14 ENCOUNTER — Ambulatory Visit (INDEPENDENT_AMBULATORY_CARE_PROVIDER_SITE_OTHER): Payer: Medicare Other | Admitting: Internal Medicine

## 2010-06-14 VITALS — BP 126/70 | HR 73 | Temp 97.7°F | Ht 63.0 in | Wt 178.0 lb

## 2010-06-14 DIAGNOSIS — R300 Dysuria: Secondary | ICD-10-CM

## 2010-06-14 DIAGNOSIS — B373 Candidiasis of vulva and vagina: Secondary | ICD-10-CM

## 2010-06-14 DIAGNOSIS — Z23 Encounter for immunization: Secondary | ICD-10-CM

## 2010-06-14 LAB — POCT URINALYSIS DIPSTICK

## 2010-06-14 MED ORDER — TETANUS-DIPHTH-ACELL PERTUSSIS 5-2.5-18.5 LF-MCG/0.5 IM SUSP
0.5000 mL | Freq: Once | INTRAMUSCULAR | Status: AC
  Administered 2010-06-14: 0.5 mL via INTRAMUSCULAR

## 2010-06-14 MED ORDER — FLUCONAZOLE 100 MG PO TABS: 100.0000 mg | ORAL_TABLET | Freq: Every day | ORAL | Status: AC

## 2010-06-14 NOTE — Progress Notes (Signed)
  Subjective:    Patient ID: Katherine Best, female    DOB: 1934-03-17, 77 y.o.   MRN: [id number]  HPI Patinet presents with a 3 week h/o perineal pain. She reports that it is constant. No exacerbating factors but it is worse when sitting. She has tried monistat without relief. She denies any discharge,  vaginal pain, dysuria, frequency or urgency, fever.  She reports that she has had another ESI by Dr. Ethelene Hal with good results  PMH/Problem list reviewed      Review of Systems  Constitutional: Negative.   Genitourinary: Positive for vaginal pain and pelvic pain. Negative for dysuria, frequency, flank pain, vaginal bleeding, vaginal discharge, enuresis, difficulty urinating, genital sores and dyspareunia.  Musculoskeletal: Negative.   Neurological: Negative.        Objective:   Physical Exam  Constitutional: She appears well-developed and well-nourished.  Abdominal: Soft. Bowel sounds are normal.  Genitourinary:       Perineum appears normal. Labia minora with inflammation without open lesions or excoriations. entroitus with erythema and a scant amount of discharge - whitish          Assessment & Plan:  1. vagintitis - appears to be mild yeast infection with erythema and burning discomfort  Plan - regular hygiene           Cotton panties            Diflucan 100mg  qd x 10

## 2010-06-23 NOTE — Op Note (Signed)
Summary: [facility]   Imported By: Sherian Rein 06/13/2010 09:05:44  _____________________________________________________________________  External Attachment:    Type:   Image     Comment:   External Document

## 2010-07-15 ENCOUNTER — Other Ambulatory Visit: Payer: Self-pay | Admitting: Internal Medicine

## 2010-07-15 DIAGNOSIS — R928 Other abnormal and inconclusive findings on diagnostic imaging of breast: Secondary | ICD-10-CM

## 2010-07-29 ENCOUNTER — Ambulatory Visit (HOSPITAL_BASED_OUTPATIENT_CLINIC_OR_DEPARTMENT_OTHER)
Admission: RE | Admit: 2010-07-29 | Discharge: 2010-07-29 | Disposition: A | Discharge status: Home / Self Care | Payer: Medicare Other | Source: Ambulatory Visit | Attending: Urology | Admitting: Urology

## 2010-07-29 DIAGNOSIS — N3010 Interstitial cystitis (chronic) without hematuria: Secondary | ICD-10-CM | POA: Insufficient documentation

## 2010-07-29 DIAGNOSIS — Z01812 Encounter for preprocedural laboratory examination: Secondary | ICD-10-CM | POA: Insufficient documentation

## 2010-07-29 DIAGNOSIS — Z79899 Other long term (current) drug therapy: Secondary | ICD-10-CM | POA: Insufficient documentation

## 2010-07-29 DIAGNOSIS — R9431 Abnormal electrocardiogram [ECG] [EKG]: Secondary | ICD-10-CM | POA: Insufficient documentation

## 2010-07-29 LAB — POCT I-STAT 4, (NA,K, GLUC, HGB,HCT)
Glucose, Bld: 136 mg/dL — ABNORMAL HIGH (ref 70–99)
HCT: 40 % (ref 36.0–46.0)
Hemoglobin: 13.6 g/dL (ref 12.0–15.0)
Potassium: 3.7 mEq/L (ref 3.5–5.1)
Sodium: 138 mEq/L (ref 135–145)

## 2010-08-01 ENCOUNTER — Ambulatory Visit
Admission: RE | Admit: 2010-08-01 | Discharge: 2010-08-01 | Disposition: A | Discharge status: Home / Self Care | Payer: Medicare Other | Source: Ambulatory Visit | Attending: Internal Medicine | Admitting: Internal Medicine

## 2010-08-01 DIAGNOSIS — R928 Other abnormal and inconclusive findings on diagnostic imaging of breast: Secondary | ICD-10-CM

## 2010-08-04 NOTE — Op Note (Signed)
NAMECHRISTLE, NOLTING NO.:  [id number]  MEDICAL RECORD NO.:  [id number]            PATIENT TYPE:  LOCATION:                                 FACILITY:  PHYSICIAN:  Albin Duckett C. Vernie Ammons, M.D.       DATE OF BIRTH:  DATE OF PROCEDURE:  07/29/2010 DATE OF DISCHARGE:                              OPERATIVE REPORT   PREOPERATIVE DIAGNOSIS:  Interstitial cystitis.  POSTOPERATIVE DIAGNOSIS:  Interstitial cystitis.  PROCEDURES: 1. Cystoscopy with urethral dilatation. 2. Hydrodistention. 3. Local injection of steroids.  SURGEON:  Rogena Deupree C. Vernie Ammons, M.D.  ANESTHESIA:  General.  SPECIMENS:  None.  ESTIMATED BLOOD LOSS:  Minimal.  DRAINS:  None.  COMPLICATIONS:  None.  INDICATIONS:  The patient is a 77 year old female with a long history of IC.  She has undergone cystoscopy and hydrodistention in the past with good control of her suprapubic pain, which has now recurred.  She has been under a great deal of stress recently.  We discussed the treatment options and she has elected to proceed with repeat hydrodistention.  The risks, complications, alternatives, and limitations have been discussed and she understands and elects to proceed.  DESCRIPTION OF OPERATION:  After informed consent, the patient was brought to the major OR, placed on the table and administered general anesthesia.  She was then moved to the dorsal lithotomy position and her genitalia was sterilely prepped and draped.  An official time-out was then performed.  Cystourethroscopy was performed initially with the 22-French cystoscope. This was introduced into the bladder with a 12-degrees lens and the urethra was noted be normal.  She did have a grade 2 to 3 cystocele noted.  The bladder was fully and systematically inspected and noted to be free of any tumor, stones or inflammatory lesions.  The ureteral orifices appeared normal in configuration.  However, the left orifice appeared slightly lateral  relative to the right.  Hydrodistention was then performed with the water placed at 80 cm above the bladder on international hydrodistention.  After drainage, there was 450 cc noted within the bladder.  I then noted petechial hemorrhages and glomerulations consistent with her IC, although they were not extensive. I then repeated the hydrodistention and then drained the bladder a second time, noting a 500 cc bladder capacity under anesthesia.  The bladder was then fully drained and I then performed urethral dilatation with the female sounds by dilating from 24-French up to 30- [place] without difficulty.  The bladder was then instilled with a combination of Pyridium and Marcaine.  Local anesthetic injection with Kenalog was then performed in the area of the trigone transvaginally using a spinal needle, placing this beneath the vaginal mucosa in the area of the periurethral region and trigone.  Dry gauze was then placed in the vagina as packing and the patient was awakened and taken to the recovery room in stable and satisfactory condition.  She tolerated the procedure well and there were no intraoperative complications.  She will be given a prescription for 30 Vicodin and 36 Uribel and follow up in the office in one week.  Azul Coffie C. Vernie Ammons, M.D.     MCO/MEDQ  D:  07/29/2010  T:  07/29/2010  Job:  [id number]  Electronically Signed by Ihor Gully M.D. on 08/04/2010 04:30:23 AM

## 2010-08-09 NOTE — Assessment & Plan Note (Signed)
[facility]                         GASTROENTEROLOGY OFFICE NOTE   NAME:Pargas, Westley Hummer                     MRN:          [id number]  DATE:02/15/2007                            DOB:          1933/08/15    CHIEF COMPLAINT:  Follow up of diarrhea and irritable bowel.   Ms. Ramberg has finished a course of Entocort EC. Her diarrhea has  resolved. She is on no anti-diarrheal. Her biopsies had shown some  eosinophils and melanosis, but no active colitis. 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   MEDICATIONS:  Listed and reviewed on the chart.   PAST MEDICAL HISTORY:  Reviewed and unchanged.   Note she says that there is an added benefit in that she is not weak and  wobbly on her legs anymore. She was having some balance issues and  thinks the Entocort helped that. I cannot explain that.   VITAL SIGNS:  Weight 189 pounds, pulse 98, blood pressure 142/80.   ASSESSMENT:  Recent problems with diarrhea, which I think was some form  of microscopic colitis as described above. Underlying irritable bowel  syndrome. All better at this time.   PLAN:  Call me back if she has persistent recurrence of the diarrhea.  Otherwise, follow up with Dr. Debby Bud.     Iva Boop, MD,FACG  Electronically Signed    CEG/MedQ  DD: 02/15/2007  DT: 02/16/2007  Job #: [id number]

## 2010-08-09 NOTE — Consult Note (Signed)
NEW PATIENT CONSULTATION   Katherine Best, Katherine Best  DOB:  1933/04/27                                       12/29/2009  IHKVQ#:[id number]   I saw this patient in the office today in consultation concerning her  bilateral lower extremity pain.  She has had pain in both lower  extremities for several years which she describes as a pressure in both  legs which involves her thighs and lower legs.  Her symptoms are  aggravated by standing and also by walking.  Her symptoms are alleviated  with elevation and with compression therapy.  She has had some  associated leg swelling.  I do not get any history of calf claudication,  rest pain or nonhealing ulcers.   Of note, she has recently had a procedure on her left medial malleolus  to release pressure overlying the nerve in this area by Dr. Lestine Box and  has done well from that standpoint.  Her symptoms in her foot have  improved since her surgery and her incision is healing nicely.  She is  scheduled to have the same procedure done on the right foot also.   Of note, she has undergone what sounds like laser ablation of bilateral  greater saphenous vein by the [facility].  She states that she  had multiple procedures in both lower extremities which began in January  2010 and May 2010.  As this was not helping, she stopped the therapy.  She states that ablating her saphenous veins did not help her symptoms  and perhaps her symptoms got worse after this.   PAST MEDICAL HISTORY:  Significant for hypertension and  hypercholesterolemia.  She denies any history of diabetes, history of  previous myocardial infarction, history of congestive heart failure or  history of COPD.   SOCIAL HISTORY:  She is married.  She has 3 children.  She quit tobacco  7 years ago.   FAMILY HISTORY:  There is no history of premature cardiovascular  disease.   REVIEW OF SYSTEMS:  GENERAL:  She has had no recent weight loss, weight  gain or  problems with her appetite.  CARDIOVASCULAR:  She has had no chest pain, chest pressure, palpitations  or arrhythmias.  She has had no history of stroke, TIAs or amaurosis  fugax.  She had no history of DVT or phlebitis that she is aware of.  GI:  She does have a history of irritable bowel syndrome.  GU:  She has had a history of cystitis.  MUSCULOSKELETAL:  She does have a history of arthritis.  NEUROLOGIC, PULMONARY, HEMATOLOGIC, ENT, PSYCHIATRIC, INTEGUMENTARY:  Unremarkable as documented on the medical history form in her chart.   PHYSICAL EXAMINATION:  This is a pleasant 77 year old woman who appears  her stated age.  Her blood pressure 144/81, saturation 97%, heart rate  is 94.  HEENT:  Unremarkable.  Lungs:  Clear bilaterally to auscultation  without rales, rhonchi or wheezing.  Cardiovascular:  I do not detect  any carotid bruits.  She has a regular rate and rhythm.  She has  palpable radial, femoral, popliteal and dorsalis pedis pulses  bilaterally.  She has a right posterior tibial pulse.  I cannot assess  the left posterior tibial pulse because of her incision.  However, she  has a biphasic posterior tibial signal on the left.  She also has  biphasic dorsalis pedis signals bilaterally and a biphasic right  posterior tibial signal.  She has some hyperpigmentation of both legs  consist of chronic venous insufficiency.  She has mild swelling.  Abdomen:  Soft and nontender with normal pitched bowel sounds.  Musculoskeletal:  No major deformities or cyanosis.  Neurologic:  She  has no focal weakness or paresthesias.  Skin:  There are no ulcers or  rashes.   I have reviewed her records from [facility].  I have  reviewed her nerve conduction studies conducted by Dr. Ethelene Hal and she had  bilateral tarsal tunnel syndrome.  The left side, as mentioned above,  has been addressed.   I have independently performed an arterial Doppler study today which  shows biphasic Doppler  signals in both feet in the dorsalis pedis and  posterior tibial positions.   Based on history and exam, I think she has evidence of chronic venous  insufficiency.  I have ordered a venous study to look for reflux in the  deep system.  I have explained the treatment for this is elevation and  compression therapy and I have instructed on proper position for leg  elevation.  In addition, I have given her a prescription for compression  stockings with a mild gradient as she has not liked the stronger  compression stockings in the past.  I have also explained given that she  does have some back pain, some of her symptoms in her legs could be  related to disk problems in her back.  She understands that this is a  chronic problem and she will have to make elevation and compression  therapy part of her lifestyle.  I will be happy to see her back if any  new issues arise.     Katherine Best, M.D.  Electronically Signed   CSD/MEDQ  D:  12/29/2009  T:  12/30/2009  Job:  [id number]   cc:   Leonides Grills, M.D.  Richard D. Ethelene Hal, M.D.  Rosalyn Gess Norins, MD

## 2010-08-09 NOTE — Assessment & Plan Note (Signed)
[facility]                         GASTROENTEROLOGY OFFICE NOTE   NAME:ENGLISHWestley Best                     MRN:          [id number]  DATE:11/02/2006                            DOB:          12/10/1933    CHIEF COMPLAINT:  Diarrhea.   ASSESSMENT:  Chronic recurrent diarrhea symptoms for a year.  This is a  change, she has been diagnosed with irritable bowel syndrome in the past  year.  She does not seem to have nocturnal stools but she has  significant post prandial diarrhea with semiformed stools that end up  loose and watery.  Imodium seems to help somewhat.  Nortriptyline seemed  to resolve this but then the symptoms recurred when she stopped  nortriptyline but have persisted despite re-institution of that.  The  nortriptyline was stopped because of night sweats.  Previous  colonoscopies have shown colon polyps in 2002.  A 2005 colonoscopy did  not show polyps.   PLAN:  I think she ought to have a colonoscopy because of the change in  bowel habits.  This sure sounds like irritable bowel syndrome but she is  72.  There has been some sort of change here and it would be appropriate  to check for that.  She has had a somewhat low TSH and a slightly high  free T4, perhaps that has had some effect.  That was in January and it  was subsequently better with a normal TSH in March.   HISTORY:  As above.  See my medical history and physical form for  further details.  There has been no bleeding, not much abdominal pain.  She never had diarrhea problems previously when she was followed by Dr.  Victorino Dike.  She has not been on antibiotics.  She went to visit and  care for grandchildren in [place] and [place] a few months ago but none  of them were sick.  There has been no other travel.   MEDICATIONS:  Listed and reviewed in the chart.  They include:  1. HCTZ 12.5 mg at bedtime.  2. Celebrex 200 mg b.i.d.  3. Metoprolol 25 mg daily.  4. Premarin 0.3 mg  daily.  5. Nortriptyline 25 mg at bedtime.  6. Levoxyl 25 mcg daily.  7. Pravastatin 40 mg daily.  8. Loratadine 10 mg daily.  9. Zantac 150 mg daily.  10.Baby aspirin daily.  11.Multivitamin daily.  12.Tums 2 each day.  13.Klor-Con 20 mEq daily.  14.Xanax p.r.n. (0.25 mg).  15.Hyoscyamine 0.125 mg p.r.n.  16.Imodium p.r.n. (This helps her diarrhea).   DRUG ALLERGIES:  None known.   PAST MEDICAL HISTORY:  1. Hysterectomy and bilateral salpingo-oophorectomy.  2. Prior foot surgery.  3. Tonsillectomy.  4. Hypothyroidism.  5. Dyslipidemia.  6. Allergies.  7. Hypertension.  8. Anxiety.   SOCIAL HISTORY:  She is married.  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  No alcohol, tobacco, or drugs.   REVIEW OF SYSTEMS:  See medical history form.   PHYSICAL EXAMINATION:  VITAL SIGNS:  Height 5 feet 3 inches, weight 199  pounds, blood pressure 126/84, pulse 80.  GENERAL:  Obese, elderly woman in no acute distress.  EYES:  Anicteric.  NECK:  Supple.  CHEST:  Clear.  HEART:  S1 S2.  No murmurs, gallops, or rubs.  ABDOMEN:  Obese, soft, nontender.  No organomegaly.  RECTAL:  On inspection on the rectal exam, she has a little bit of  perianal erythema but no other abnormalities.  (She has had some burning  with the diarrhea.)  EXTREMITIES:  No edema.  SKIN:  No rash.  PSYCH:  She is alert and oriented x3.   Note, the night sweats seem to be related to the nortriptyline.  She may  need a further workup for these depending upon the clinical course here.  I doubt it is related to her colon.  Maybe there are some other thyroid  issues going on again and we need to recheck that as well.   Note, the patient requested to be excused from jury duty which I think  is reasonable.  I am on vacation next week, she is going to fax the  address and the request to Dr. Debby Bud in the interim.     Iva Boop, MD,FACG  Electronically Signed    CEG/MedQ  DD:  11/02/2006  DT: 11/02/2006  Job #: [id number]   cc:   Rosalyn Gess. Norins, MD

## 2010-08-09 NOTE — Op Note (Signed)
NAMENAOMIE, CROW NO.:  [id number]   MEDICAL RECORD NO.:  [id number]          PATIENT TYPE:  AMB   LOCATION:  [facility]                         FACILITY:  [facility]   PHYSICIAN:  Mark C. Vernie Ammons, M.D.  DATE OF BIRTH:  03/01/1934   DATE OF PROCEDURE:  01/18/2007  DATE OF DISCHARGE:                               OPERATIVE REPORT   PREOPERATIVE DIAGNOSIS:  Interstitial cystitis.   POSTOPERATIVE DIAGNOSIS:  Interstitial cystitis.   PROCEDURE:  Cystoscopy with urethral dilatation, hydrodistention and  injection of local anesthetic/steroid.   SURGEON:  Mark C. Vernie Ammons, M.D.   ANESTHESIA:  General.   BLOOD LOSS:  Minimal.   DRAINS:  None.   COMPLICATIONS:  None.   INDICATIONS:  The patient is a 77 year old white female with a history  of intraoperative cholangiogram.  She has been treated with intravesical  treatments in the past with success.  However, she has recently  developed significant bladder discomfort that responded only short-term  to intravesical installation.  She is describing suprapubic discomfort  worse when her bladder is full, better when it empties, and she had no  evidence of infection.  KUB revealed no evidence of stones near the  bladder, and therefore, we discussed treatment with cystoscopy and  hydrodistention.  We went over the risks and complications.  She  understands and has elected to proceed.   DESCRIPTION OF OPERATION:  After informed consent, the patient was  brought to the major OR, placed table and administered general  anesthesia, then moved to the dorsal lithotomy position.  Her genitalia  and vagina were sterilely prepped and draped.  Initially, a 22-French  cystoscope was introduced per urethra in bladder under direct  visualization.  This revealed a normal-appearing urethra.  The bladder  was then entered and fully inspected.  No tumor, stones or inflammatory  lesions were seen.  She has a cystocele, and this could be  visualized  cystoscopically.  No other abnormality was noted within the bladder.   Hydrodistention was then performed to 100 cm water pressure.  Upon  draining the bladder, I noted the terminal effluent had slight blood  tinge to it, and bladder capacity was noted to be only 400 mL under  anesthesia.  I repeated hydrodistention and noted bladder capacity 500  mL.   The cystoscope was removed, and I inserted a 22-French Foley catheter in  the bladder.  I then performed hydrodistention a third time, leaving the  bladder distended for approximately 5 minutes and then drained the  bladder a final time.  The volume at that time in the bladder was noted  to be 700 mL.   The bladder was drained with catheter in place to delineate the location  of the urethra and the balloon placed at the bladder neck.  Combination  of Marcaine and Kenalog was then injected in the subvaginal mucosa  periurethrally and at the bladder neck as well as trigonal region.   The catheter was then removed, and urethral dilatation was performed  using female sounds from 22-28 [place].  I then instilled  Pyridium/Marcaine solution in the  bladder, and the patient was awakened  and taken to recovery in stable satisfactory condition.  Vaginal packing  was left in place and will be removed in the recovery room, and she will  be discharged home once fully recovered.  She has Tylenol #3 at home for  pain.  She will use that and also has a prescription of Urelle as well  she will use as p.r.n.  She will return to my office as needed.      Mark C. Vernie Ammons, M.D.  Electronically Signed     MCO/MEDQ  D:  01/18/2007  T:  01/19/2007  Job:  [id number]

## 2010-08-09 NOTE — Procedures (Signed)
LOWER EXTREMITY VENOUS REFLUX EXAM   INDICATION:   EXAM:  Using color-flow imaging and pulse Doppler spectral analysis, the  right and left common femoral, superficial femoral, popliteal, posterior  tibial, greater and lesser saphenous veins were evaluated.  There is  evidence suggesting deep venous insufficiency in the common femoral  veins of the right and left lower extremity.   The right saphenofemoral junction is not competent with reflux of >500  milliseconds.  The left saphenofemoral junction is competent.  The right  GSV is not competent with reflux of >500 milliseconds with the caliber  as described below.   The right and left proximal short saphenous veins demonstrate  competency.   GSV Diameter (used if found to be incompetent only)                                            Right         Left  Proximal Greater Saphenous Vein           0.55 cm       cm  Proximal-to-mid-thigh                     cm            cm  Mid thigh                                 0.29 cm       cm  Mid-distal thigh                          cm            cm  Distal thigh                              0.30 cm       cm  Knee                                      cm            cm   IMPRESSION:  1. The right greater saphenous vein is not competent at the      saphenofemoral junction with reflux of >500 milliseconds.  2. The right greater saphenous vein is not tortuous.  3. The deep venous system common femoral vein is not competent with      reflux of >500 milliseconds.  4. The right and left short saphenous vein is competent.       ___________________________________________  Di Kindle. Edilia Bo, M.D.   OD/MEDQ  D:  01/14/2010  T:  01/14/2010  Job:  [id number]

## 2010-08-09 NOTE — Assessment & Plan Note (Signed)
[facility]                         GASTROENTEROLOGY OFFICE NOTE   NAME:ENGLISHWestley Best                     MRN:          [id number]  DATE:12/26/2006                            DOB:          28-Sep-1933    CHIEF COMPLAINT:  Follow-up after colonoscopy.   I have placed her on Lomotil.  She is doing well.  Her biopsy showed  some melanosis and some eosinophils.  She denies use of any laxative or  herbal supplements.  She can get out of the house and lead a more normal  life at this time.  See my note of November 02, 2006, for further details  of her medications and past medical history other than the Lomotil  mentioned above.  She is not taking Imodium.   VITALS:  Weight 191 pounds, pulse 84, blood pressure 152/82.   ASSESSMENT:  She may have some form of microscopic colitis with the  eosinophils.  That may be a nonspecific finding, too.   PLAN:  1. Trial of Entocort EC for six weeks at 9 mg daily.  2. Return to see me in 2 months.  3. Continue Lomotil as needed but try to wean.  4. Her blood pressure is mildly elevated today.  She will check that      at home and follow up with Dr. Debby Best as needed on that.  5. Note she does have interstitial cystitis and that makes irritable      bowel syndrome more likely than a colitis problem, but it is      certainly possible, i.e., the colitis.  I did not have the      diagnosis of interstitial cystitis when she saw me the last time,      so we will add that to her problem list.     Iva Boop, MD,FACG  Electronically Signed    CEG/MedQ  DD: 12/26/2006  DT: 12/27/2006  Job #: [id number]   cc:   Katherine Gess. Norins, MD

## 2010-08-12 NOTE — Assessment & Plan Note (Signed)
[facility]                             PRIMARY CARE OFFICE NOTE   NAME:Shular, Westley Hummer                     MRN:          [id number]  DATE:01/19/2006                            DOB:          10-12-1933    HISTORY OF PRESENT ILLNESS:  Ms. Barbato was last seen in the office on  January 03, 2006.  She is being evaluated for possible claudication.  She  described a syndrome where she will  have weakness and fatigue in her legs  that limits her activities.  The patient has had lower extremity arterial  Doppler study performed January 10, 2006 read out as showing no ABI's at 1.1  bilaterally.  Dr. Samule Ohm suggested it was clinically suspicious for  claudication, exercise ABI's should be obtained.  This has been scheduled.   The patient, on today's presentation, reports that for the past five or six  days she has had constant chest pressure that is located underneath her  breasts and does radiate back to her back.  It is constant.  She reports  that she has had episodes with exertion where she has also had significant  diaphoresis and she has decreased exercise tolerance in addition to the  weakness in her legs.  The patient has no prior cardiac disease. Patient has  been followed for hypertension.   PAST MEDICAL HISTORY:  1. Usual childhood diseases.  2. Hypertension.  3. Climacteric.  4. Morton's neuroma.  5. Hyperlipidemia.  6. Hypothyroid disease.  7. IBS.   PAST SURGICAL HISTORY:  1. Tonsillectomy.  2. Foot surgery.  3. Hysterectomy with bilateral salpingo-oophorectomy.   CURRENT MEDICATIONS:  1. Tums daily.  2. Hydrochlorothiazide 25 mg daily.  3. Aspirin 81 mg daily.  4. Claritin 10 mg daily.  5. Premarin 0.3 mg daily.  6. Zantac 150 mg b.i.d.  7. Celebrex 200 mg daily.  8. Levoxyl 50 mcg daily.  9. Pravastatin 40 mg daily.  10.Hyoscyamine 0.125 mg q.2h. p.r.n.  11.Clidinium q.8h. p.r.n.   FAMILY HISTORY:  Positive for cardiac  disease and cardiac death in her  father.   SOCIAL HISTORY:  The patient is married for 50 years.  She has 8  grandchildren. She and her husband are retired but remain independent in  their activities of daily living.   CHART REVIEW:  Patient had a colonoscopy Jul 30, 2003.  Last laboratory from  December 25, 2005 revealed a TSH that was 0.64 normal.  Hematology was  negative.  Last lipid panel August 30, 2005 with cholesterol 210, HDL 55.1, LDL  137.7.   The patient had an MRI of the lumbar spine without contrast on December 12, 2005 which showed mild disc bulging throughout the lumbar region with facet  arthropathy.  There is no significant surgical abnormality noted.  The  patient has had a CT scan of the chest December 19, 2005 which showed no  evidence of thoracic aortic aneurysm.  A small hiatal hernia is noted.  She  has lipomatous hypertrophy of the intra-atrial septum.  Mild fatty liver.  Nonspecific right adrenal nodule.  Multi-level  thoracic spondylosis.   PHYSICAL EXAMINATION:  VITAL SIGNS:  On exam today temperature 98.7, blood  pressure 148/82, pulse 82, weight 191.  GENERAL APPEARANCE:  Heavy-set Caucasian woman in no acute distress.  CHEST:  Clear with no rales, wheezes or rhonchi.  CARDIOVASCULAR:  2+ radial pulse.  She had a quiet precordium with regular  rate and rhythm without murmurs, rubs or gallops.   A 12-lead electrocardiogram was read as normal sinus rhythm with no signs of  injury, no signs of strain and normal EKG.   ASSESSMENT/PLAN:  1. Cardiovascular:  The patient is in the middle of evaluation for      exercise-related claudication.  Exercise ankle brachial indices are      scheduled.  2. Patient is now presenting with symptoms suggestive and worrisome for      anginal equivalent.  Her risk factors include being postmenopausal, her      weight, positive family history, hyperlipidemia and hypertension.   The patient is referred to cardiology for  evaluation with diagnostic studies  to be determined by consultant.    ______________________________  Rosalyn Gess. Norins, MD    MEN/MedQ  DD: 01/19/2006  DT: 01/19/2006  Job #: [id number]

## 2010-08-12 NOTE — Letter (Signed)
February 16, 2006    [facility]  [address]  [place], [place] [address]   RE:  Katherine Best  MRN:  [id number]  /  DOB:  1933-09-23   Dear Colleagues,   Thank you very much for your assistance in evaluating Katherine Best for  thoracic back pain.  Katherine Best has been complaining of mid-thoracic pain  for approximately one year.  She had a CT scan of the chest in September  2006 which showed no abdominal aortic aneurysm. Multiple-level degenerative  joint disease was noted.  The patient had a T-spine x-ray October of 2007  which showed DJD without loss of disk height, mitigating against compressive  neuropathy.  The patient reports that her pain and discomfort is becoming  worse.   On examination, the patient has full range of motion of her upper  extremities about the shoulders.  She did have tenderness at the  costovertebral angle, T2-T4 but no mass was palpable.  The patient had  normal upper extremity motor strength range of motion.  She had normal DTRs.  Sensation was normal to light touch, pin prick.  At this point, with x-rays  being non-diagnostic, CT scan being negative for any intrathoracic  abnormality, I appreciate your assistance for evaluating her for possible  neuropathic origin of her discomfort.   Enclosed is my note from January 19, 2006 which provides a complete past  medical history, family history and social history, as well as a complete  list of her medications.   If I can provide any additional information, please do not hesitate to  contact me.  I appreciate your assistance in evaluating this patient for a  difficult to understand back pain syndrome.    Sincerely,      Rosalyn Gess. Norins, MD  Electronically Signed    MEN/MedQ  DD: 02/17/2006  DT: 02/17/2006  Job #: [id number]

## 2010-09-18 ENCOUNTER — Other Ambulatory Visit: Payer: Self-pay | Admitting: Internal Medicine

## 2010-12-02 ENCOUNTER — Encounter: Payer: Self-pay | Admitting: Internal Medicine

## 2010-12-02 ENCOUNTER — Ambulatory Visit (INDEPENDENT_AMBULATORY_CARE_PROVIDER_SITE_OTHER): Payer: Medicare Other | Admitting: Internal Medicine

## 2010-12-02 VITALS — BP 160/80 | HR 89 | Temp 98.2°F | Wt 179.0 lb

## 2010-12-02 DIAGNOSIS — M79606 Pain in leg, unspecified: Secondary | ICD-10-CM

## 2010-12-02 DIAGNOSIS — I872 Venous insufficiency (chronic) (peripheral): Secondary | ICD-10-CM

## 2010-12-02 DIAGNOSIS — M79609 Pain in unspecified limb: Secondary | ICD-10-CM

## 2010-12-02 DIAGNOSIS — R51 Headache: Secondary | ICD-10-CM

## 2010-12-02 DIAGNOSIS — I10 Essential (primary) hypertension: Secondary | ICD-10-CM

## 2010-12-02 DIAGNOSIS — G4700 Insomnia, unspecified: Secondary | ICD-10-CM

## 2010-12-02 MED ORDER — ESTRADIOL 1 MG PO TABS: 1.0000 mg | ORAL_TABLET | Freq: Every day | ORAL | Status: DC

## 2010-12-02 MED ORDER — OLMESARTAN MEDOXOMIL-HCTZ 20-12.5 MG PO TABS: 1.0000 | ORAL_TABLET | Freq: Every day | ORAL | Status: DC

## 2010-12-02 NOTE — Patient Instructions (Addendum)
Leg weakness and discomfort. Legs are tender to palpation. May be the cholesterol medication. It is ok to stop the Pravachol to see if the leg weakness gets better.  Sleep - try to go to bed the same time and rise the same time every day. Try not to nap during the day. No activity but sleep in the bedroom: no TV, reading or other activity. Avoid stimulants - caffeine, chocolat. Exercise early in the day.  Headache - early morning. It is ok to take Norco (hydrocodone/tylenol 5/325) once a day plus up to 2, 675mg  of tylenol safely.  Leg swelling - try a lighter weight stocking - try over the counter working girl's support hose.   Over all fatigue may get better if we can do the above.   Will change premarin to estrdiol 1 mg daily.  Blood pressure too high. Plan - continue metoprolol 50mg  twice a day. Will combine the HCTZ  But at 12.5 mg with benicar 20 mg for BP control.

## 2010-12-04 DIAGNOSIS — G4700 Insomnia, unspecified: Secondary | ICD-10-CM | POA: Insufficient documentation

## 2010-12-04 NOTE — Assessment & Plan Note (Signed)
She report poor sleep, frequent daytime naps, irregular hours and contributors like pain.  Plan - reviewed sleep hygiene in detail.           No new medications at this time

## 2010-12-04 NOTE — Assessment & Plan Note (Signed)
BP Readings from Last 3 Encounters:  12/02/10 160/80  06/14/10 126/70  03/29/10 142/84   Patient reports her BP readings have trended upward.  Plan - change medication: HCTZ to 12.5, add Benicar 20mg  qd           Follow-up BP

## 2010-12-04 NOTE — Progress Notes (Signed)
  Subjective:    Patient ID: Katherine Best, female    DOB: Feb 21, 1934, 77 y.o.   MRN: [id number]  HPI Mrs. Peed presents with several complaints:  Leg pain has been persistent, thorughout the leg, an aching discomfort, not exercise related. She question a "statin" rxn.  Her sleep has been erratic and she does not feel rested. She has both on-set and duration problems.  She reports frequent headaches, usually in the AM. Non-descript in location, of low to moderate discomfort w/o associated symptoms. Relieved with APAP  Chronic peripheral edema. She was too uncomfortable in Rx hose although they did help.  Her blood pressure has been running elevated with SBPs 150-160 range, DBPs 80-90 range. She has been asymptomatic.  I have reviewed the patient's medical history in detail and updated the computerized patient record.    Review of Systems System review is negative for any constitutional, cardiac, pulmonary, GI or neuro symptoms or complaints except as noted in the HPI.     Objective:   Physical Exam Vitals reviewed - elevated BP noted Gen'l- overweight white woman in no distress Pulmonary - normal respirations Cor - 2+ radial pulse, RRR Extremity - bandage right leg from recent minor trauma, no changes of venous stasis or venous dermatitis. 1+ pitting edema. Neuro- A&O x 3, speech cogent.       Assessment & Plan:  Leg pain - question of "statin" related myopathy.  Plan - drug holiday  Headache - usual type headache of minor to moderate discomfort.  Plan - OK to take APAP up to 3,000 mg qd - helped her to calculate dose.

## 2010-12-04 NOTE — Assessment & Plan Note (Signed)
Continued edema but without advanced skin changes or lesions.  Plan - trial of otc support hose

## 2010-12-15 ENCOUNTER — Telehealth: Payer: Self-pay | Admitting: *Deleted

## 2010-12-15 NOTE — Telephone Encounter (Signed)
Pt was in the office on Sept 7, 2012. You gave pt samples of Benicar 20/ 12.5 mg. Pt has been keeping record of her BP since on medication with good readings. Pt feels as though she has good results from medication and her insurance will not cover medication. I gave pt about a one month sample of the Benicar 20/12.5.  She will take up the samples I have given her. But states her insurance will cover Losartan HCT  Or Diovan HCT. . What do you advise for pt.  Pt aware MD will be back next week

## 2010-12-18 NOTE — Telephone Encounter (Signed)
Losartan/hct 50/12.5 1 q day, #30 3 refills

## 2010-12-18 NOTE — Telephone Encounter (Signed)
Oops, closed before returning.

## 2010-12-19 ENCOUNTER — Other Ambulatory Visit: Payer: Self-pay | Admitting: *Deleted

## 2010-12-19 MED ORDER — LOSARTAN POTASSIUM-HCTZ 50-12.5 MG PO TABS: 1.0000 | ORAL_TABLET | Freq: Every day | ORAL | Status: DC

## 2010-12-19 NOTE — Telephone Encounter (Signed)
Addended byRosalio Macadamia, Terie Lear B on: 12/19/2010 04:48 PM   Modules accepted: Orders

## 2010-12-26 ENCOUNTER — Other Ambulatory Visit: Payer: Self-pay | Admitting: *Deleted

## 2010-12-26 MED ORDER — LOSARTAN POTASSIUM-HCTZ 50-12.5 MG PO TABS: 1.0000 | ORAL_TABLET | Freq: Every day | ORAL | Status: DC

## 2011-01-04 LAB — BASIC METABOLIC PANEL
BUN: 21
CO2: 31
Calcium: 9.3
Chloride: 99
Creatinine, Ser: 0.59
GFR calc Af Amer: >60
GFR calc non Af Amer: >60
Glucose, Bld: 88
Potassium: 4.1
Sodium: 139

## 2011-01-04 LAB — POCT HEMOGLOBIN-HEMACUE
Hemoglobin: 14.9
Operator id: 280881

## 2011-01-06 ENCOUNTER — Ambulatory Visit (INDEPENDENT_AMBULATORY_CARE_PROVIDER_SITE_OTHER): Payer: Medicare Other | Admitting: Internal Medicine

## 2011-01-06 ENCOUNTER — Encounter: Payer: Self-pay | Admitting: Internal Medicine

## 2011-01-06 VITALS — BP 130/76 | HR 88 | Temp 98.1°F | Resp 16 | Wt 178.0 lb

## 2011-01-06 DIAGNOSIS — R059 Cough, unspecified: Secondary | ICD-10-CM

## 2011-01-06 DIAGNOSIS — R05 Cough: Secondary | ICD-10-CM

## 2011-01-06 MED ORDER — PREDNISONE 10 MG PO TABS: 10.0000 mg | ORAL_TABLET | Freq: Every day | ORAL | Status: AC

## 2011-01-06 MED ORDER — PROMETHAZINE-CODEINE 6.25-10 MG/5ML PO SYRP: 5.0000 mL | ORAL_SOLUTION | ORAL | Status: AC | PRN

## 2011-01-06 NOTE — Patient Instructions (Signed)
Cough - most likely a cyclical cough: cough caused irritation of the tracheal which causes cough which causes irritation, etc. Etc. Plan - promethazine w/ codiene as directed, tessalon perles 100mg  three times a day, prednisone 10 mg daily x 10 days.

## 2011-01-07 ENCOUNTER — Other Ambulatory Visit: Payer: Self-pay | Admitting: Internal Medicine

## 2011-01-09 NOTE — Progress Notes (Signed)
  Subjective:    Patient ID: Katherine Best, female    DOB: [birth year], 77 y.o.   MRN: [id number]  HPI Patient was recently treated for bronchitis type illness. She has continue to cough since that time with scant production of sputum. She has had no fever, no SOB or DOE, no c/p, no decreased exercise tolerance.  I have reviewed the patient's medical history in detail and updated the computerized patient record.    Review of Systems System review is negative for any constitutional, cardiac, pulmonary, GI or neuro symptoms or complaints other than as described in the HPI.     Objective:   Physical Exam Vitals - noted - no fever Gen'l - older white woman in no distress HEENT- wnl; Chest - good breath sounds with no rales, wheezes or rhonchi       Assessment & Plan:  Cough - cyclical cough with no signs of persistent infection.  Plan - prom/cod 1 tsp q 6; tessalon perles 100mg  tid x 10, prednisone burst and taper.

## 2011-01-24 ENCOUNTER — Ambulatory Visit (INDEPENDENT_AMBULATORY_CARE_PROVIDER_SITE_OTHER): Payer: Medicare Other | Admitting: Internal Medicine

## 2011-01-24 ENCOUNTER — Encounter: Payer: Self-pay | Admitting: Internal Medicine

## 2011-01-24 VITALS — BP 122/70 | HR 90 | Temp 96.7°F | Wt 174.0 lb

## 2011-01-24 DIAGNOSIS — R05 Cough: Secondary | ICD-10-CM

## 2011-01-24 NOTE — Progress Notes (Signed)
  Subjective:    Patient ID: Katherine Best, female    DOB: 1933-04-09, 77 y.o.   MRN: [id number]  HPI Katherine Best has a persistent dry cough. She has tried prednisone burst and taper along with codeine cough syrup and tessalon perles. She is still coughing. No fever or chills, no sputum production, no SOB except during paroxysm of coughing. She is on no anti-reflux medication.   I have reviewed the patient's medical history in detail and updated the computerized patient record.    Review of Systems System review is negative for any constitutional, cardiac, pulmonary, GI or neuro symptoms or complaints other than as described in the HPI.     Objective:   Physical Exam Vitals - stable Gen'l- overweight white woman in no acute distress HEENT- C&SD clear, tongue coated, posterior pharynx clear Chest- no deformity Lungs - good breath sounds, no Rales or wheezes       Assessment & Plan:  Cough - persistent cough that did not respond to treatment for cyclical cough. May be silent reflux.  Plan - Prilosec OTC 20mg   2 capsules q AM           If no relief after 5 days will refer to pulmonary.

## 2011-01-24 NOTE — Patient Instructions (Signed)
Dry cough that has not responded to steroids, codeine and tessalon perles. Next is to treat for possible acid reflux that is asymptomatic except for cough. Plan - ok to continue with the tessalon perles and the codeine cough syrup. Will try proton pump inhibitor - prilosec 20mg  OTC, take two in the AM. If this doesn't relieve the cough after 5 days let me know and I will refer you to a pulmonary specialist (lung).

## 2011-01-30 ENCOUNTER — Telehealth: Payer: Self-pay | Admitting: *Deleted

## 2011-01-30 DIAGNOSIS — R05 Cough: Secondary | ICD-10-CM

## 2011-01-30 NOTE — Telephone Encounter (Signed)
Pt calling for 5 day follow up.  Pt states she still has deep cough that she feels like she is choking.  Cough is not as often on prilosec.  She would like to proceed with pulmonary referral.

## 2011-01-30 NOTE — Telephone Encounter (Signed)
Referral placed.

## 2011-02-01 ENCOUNTER — Other Ambulatory Visit: Payer: Self-pay | Admitting: Internal Medicine

## 2011-02-03 ENCOUNTER — Encounter: Payer: Self-pay | Admitting: Emergency Medicine

## 2011-02-03 ENCOUNTER — Ambulatory Visit (INDEPENDENT_AMBULATORY_CARE_PROVIDER_SITE_OTHER)
Admission: RE | Admit: 2011-02-03 | Discharge: 2011-02-03 | Disposition: A | Discharge status: Home / Self Care | Payer: Medicare Other | Source: Ambulatory Visit | Attending: Emergency Medicine | Admitting: Emergency Medicine

## 2011-02-03 ENCOUNTER — Ambulatory Visit (INDEPENDENT_AMBULATORY_CARE_PROVIDER_SITE_OTHER): Payer: Medicare Other | Admitting: Emergency Medicine

## 2011-02-03 VITALS — BP 124/80 | HR 86 | Temp 98.4°F | Ht 63.0 in | Wt 187.0 lb

## 2011-02-03 DIAGNOSIS — R05 Cough: Secondary | ICD-10-CM | POA: Insufficient documentation

## 2011-02-03 DIAGNOSIS — R053 Chronic cough: Secondary | ICD-10-CM | POA: Insufficient documentation

## 2011-02-03 DIAGNOSIS — R059 Cough, unspecified: Secondary | ICD-10-CM

## 2011-02-03 MED ORDER — FLUTICASONE PROPIONATE 50 MCG/ACT NA SUSP: 2.0000 | Freq: Two times a day (BID) | NASAL | Status: DC

## 2011-02-03 MED ORDER — ESOMEPRAZOLE MAGNESIUM 40 MG PO CPDR: 40.0000 mg | DELAYED_RELEASE_CAPSULE | Freq: Two times a day (BID) | ORAL | Status: DC

## 2011-02-03 NOTE — Patient Instructions (Signed)
Continue your loratadine daily Restart your fluticasone nasal spray, 2 sprays each nostril daily Stop omeprazole (Prilosec) and start Nexium 40mg  twice a day for the next month We will perform full lung function testing at you next office visit We will perform a CXR If your cough continues, we will need to discuss with Dr Debby Bud an alternative BP medication, as your current medication can sometimes be associated with cough.  Follow with Dr Delton Coombes in 1 month to review your status.

## 2011-02-03 NOTE — Progress Notes (Signed)
Subjective:    Patient ID: Katherine Best, female    DOB: 05/13/1933, 77 y.o.   MRN: [id number]  HPI 77 yo former smoker (35 pk-yrs), hx of HTN, hyperlipidemia. She is referred by Dr Debby Bud for cough that started about 2 months ago. She was well, has no real cough prior to this episode. The cough has been dry - never productive. No CP. She has a lot of indigestion, a lot of burping. She was started on omeprazole 10/30, hasn't really noticed a difference in the cough. She tells me that the cough is worse with talking, will often happen after eating certain foods. She has a globus sensation, clears throat often. She has some sneezing, no real congestion. She is on loratadine, takes actifed prn. Has been rx with abx and also prednisone burst and taper along with codeine cough syrup and tessalon perles. Of note she had an ARB/HCTZ added about 2.5 months ago    Review of Systems  Constitutional: Negative.  Negative for fever and unexpected weight change.  HENT: Positive for sneezing. Negative for ear pain, nosebleeds, congestion, sore throat, rhinorrhea, trouble swallowing, dental problem, postnasal drip and sinus pressure.   Eyes: Negative for redness and itching.  Respiratory: Positive for cough. Negative for chest tightness, shortness of breath and wheezing.   Cardiovascular: Positive for leg swelling. Negative for palpitations.  Gastrointestinal: Negative.  Negative for nausea and vomiting.  Genitourinary: Negative.  Negative for dysuria.  Musculoskeletal: Positive for arthralgias. Negative for joint swelling.  Skin: Positive for rash.  Neurological: Negative.  Negative for headaches.  Hematological: Negative.  Does not bruise/bleed easily.  Psychiatric/Behavioral: Negative.  Negative for dysphoric mood. The patient is not nervous/anxious.     Past Medical History  Diagnosis Date  . Unspecified venous (peripheral) insufficiency   . Tarsal tunnel syndrome   . Abdominal pain, right lower  quadrant   . Other specified gastritis without mention of hemorrhage   . Obesity, unspecified   . Liver function study, abnormal   . Menopausal syndrome   . Diverticulosis   . External hemorrhoid   . Anxiety   . Interstitial cystitis   . Bursitis   . Hypothyroid   . IBS (irritable bowel syndrome)   . Morton's neuroma   . Hypertension   . Hyperlipidemia      Family History  Problem Relation Age of Onset  . Cancer Mother     Breast  . Hypertension Father      History   Social History  . Marital Status: Married    Spouse Name: N/A    Number of Children: N/A  . Years of Education: N/A   Occupational History  . Not on file.   Social History Main Topics  . Smoking status: Former Smoker -- 1.0 packs/day for 35 years    Types: Cigarettes    Quit date: 03/27/2002  . Smokeless tobacco: Never Used  . Alcohol Use: No  . Drug Use: No  . Sexually Active: Not on file   Other Topics Concern  . Not on file   Social History Narrative   HSGMarried '573 sons- '60, '61, '64, grandchildren 8 (7 girls, 1 boy)Lives independently with husbandPatient is a former smokerAlcohol use- yes socially not in yearsIllicit Drug use- noDrapery Neurosurgeon (still looking for work)     Allergies  Allergen Reactions  . Sulfonamide Derivatives     REACTION: Nausea     Outpatient Prescriptions Prior to Visit  Medication Sig Dispense Refill  .  ALPRAZolam (XANAX) 0.25 MG tablet Take 0.25 mg by mouth as needed.        Marland Kitchen aspirin 81 MG tablet Take 81 mg by mouth daily.        . calcium carbonate (TUMS) 500 MG chewable tablet Chew 1 tablet by mouth 2 (two) times daily.        . ciprofloxacin (CIPRO) 100 MG tablet Take 100 mg by mouth daily.        Marland Kitchen estradiol (ESTRACE) 1 MG tablet Take 1 tablet (1 mg total) by mouth daily.  90 tablet  3  . Flavocoxid (LIMBREL) 500 MG CAPS Take 2 capsules by mouth.        Marland Kitchen HYDROcodone-acetaminophen (NORCO) 5-325 MG per tablet Take 1 tablet by mouth as needed. For  back pain       . LEVOXYL 25 MCG tablet TAKE 1 TABLET DAILY  90 tablet  2  . loperamide (IMODIUM A-D) 2 MG tablet Take 2 mg by mouth as needed.        . loratadine (LORADAMED) 10 MG tablet Take 10 mg by mouth daily.        Marland Kitchen losartan-hydrochlorothiazide (HYZAAR) 50-12.5 MG per tablet Take 1 tablet by mouth daily.  30 tablet  3  . methocarbamol (ROBAXIN) 750 MG tablet Take 750 mg by mouth as needed.        . metoprolol (LOPRESSOR) 50 MG tablet Take 50 mg by mouth 2 (two) times daily.        . MULTIPLE VITAMIN PO Take by mouth daily.        . potassium chloride SA (K-DUR,KLOR-CON) 20 MEQ tablet TAKE 1 TABLET ONCE A DAY  90 tablet  2  . fluticasone (FLONASE) 50 MCG/ACT nasal spray 2 sprays by Nasal route daily.        . cromolyn (OPTICROM) 4 % ophthalmic solution Place 2 drops into the right eye 4 (four) times daily.        . hyoscyamine (ANASPAZ) 0.125 MG TBDP Place 0.125 mg under the tongue as needed.        Marland Kitchen losartan-hydrochlorothiazide (HYZAAR) 50-12.5 MG per tablet Take 1 tablet by mouth daily.  90 tablet  3  . nortriptyline (PAMELOR) 25 MG capsule Take 25 mg by mouth at bedtime.        Marland Kitchen olmesartan-hydrochlorothiazide (BENICAR HCT) 20-12.5 MG per tablet Take 1 tablet by mouth daily.  1 tablet  0  . potassium chloride (KLOR-CON) 20 MEQ packet Take 20 mEq by mouth daily.        . pravastatin (PRAVACHOL) 40 MG tablet TAKE 1 TABLET DAILY  90 tablet  1       Objective:   Physical Exam  Gen: Pleasant, slightly overwt woman, in no distress,  normal affect  ENT: No lesions,  mouth clear,  oropharynx clear, no postnasal drip  Neck: No JVD, no TMG, no carotid bruits  Lungs: No use of accessory muscles, no dullness to percussion, clear without rales or rhonchi  Cardiovascular: RRR, heart sounds normal, no murmur or gallops, no peripheral edema  Musculoskeletal: No deformities, no cyanosis or clubbing  Neuro: alert, non focal  Skin: Warm, no lesions or rashes     Assessment & Plan:    Chronic cough Suspect contribution of GERD (as did Dr Debby Bud) + allergic rhinitis. Also suspect that the addition of ARB to her BP regimen has made her more sensitive to both allergies and GERD. Will try to aggressively treat contributing factors and  then reassess. Will perform full PFT to r/o contribution of AFL, check CXR given smoking hx.   Continue your loratadine daily Restart your fluticasone nasal spray, 2 sprays each nostril daily Stop omeprazole (Prilosec) and start Nexium 40mg  twice a day for the next month We will perform full lung function testing at you next office visit We will perform a CXR If your cough continues, we will need to discuss with Dr Debby Bud an alternative BP medication, as your current medication can sometimes be associated with cough.  Follow with Dr Delton Coombes in 1 month to review your status.

## 2011-02-03 NOTE — Assessment & Plan Note (Signed)
Suspect contribution of GERD (as did Dr Debby Bud) + allergic rhinitis. Also suspect that the addition of ARB to her BP regimen has made her more sensitive to both allergies and GERD. Will try to aggressively treat contributing factors and then reassess. Will perform full PFT to r/o contribution of AFL, check CXR given smoking hx.   Continue your loratadine daily Restart your fluticasone nasal spray, 2 sprays each nostril daily Stop omeprazole (Prilosec) and start Nexium 40mg  twice a day for the next month We will perform full lung function testing at you next office visit We will perform a CXR If your cough continues, we will need to discuss with Dr Debby Bud an alternative BP medication, as your current medication can sometimes be associated with cough.  Follow with Dr Delton Coombes in 1 month to review your status.

## 2011-02-21 ENCOUNTER — Other Ambulatory Visit: Payer: Self-pay | Admitting: Internal Medicine

## 2011-03-10 ENCOUNTER — Ambulatory Visit (INDEPENDENT_AMBULATORY_CARE_PROVIDER_SITE_OTHER): Payer: Medicare Other | Admitting: Emergency Medicine

## 2011-03-10 ENCOUNTER — Encounter: Payer: Self-pay | Admitting: Emergency Medicine

## 2011-03-10 DIAGNOSIS — R059 Cough, unspecified: Secondary | ICD-10-CM

## 2011-03-10 DIAGNOSIS — R05 Cough: Secondary | ICD-10-CM

## 2011-03-10 LAB — PULMONARY FUNCTION TEST

## 2011-03-10 NOTE — Progress Notes (Signed)
  Subjective:    Patient ID: Katherine Best, female    DOB: 07-09-1933, 77 y.o.   MRN: [id number]  HPI 77 yo former smoker (35 pk-yrs), hx of HTN, hyperlipidemia. She is referred by Dr Debby Bud for cough that started about 2 months ago. She was well, has no real cough prior to this episode. The cough has been dry - never productive. No CP. She has a lot of indigestion, a lot of burping. She was started on omeprazole 10/30, hasn't really noticed a difference in the cough. She tells me that the cough is worse with talking, will often happen after eating certain foods. She has a globus sensation, clears throat often. She has some sneezing, no real congestion. She is on loratadine, takes actifed prn. Has been rx with abx and also prednisone burst and taper along with codeine cough syrup and tessalon perles. Of note she had an ARB/HCTZ added about 2.5 months ago   ROV 03/10/11 -- Returns for her cough. It is much better since PPI was added. Still bothers her some but better. Non-productive. No PND. She had abd pain when she was taking nexium bid so changed to qam. PFt today with no evidence for AFL.      Objective:   Physical Exam  Gen: Pleasant, slightly overwt woman, in no distress,  normal affect  ENT: No lesions,  mouth clear,  oropharynx clear, no postnasal drip  Neck: No JVD, no TMG, no carotid bruits  Lungs: No use of accessory muscles, no dullness to percussion, clear without rales or rhonchi  Cardiovascular: RRR, heart sounds normal, no murmur or gallops, no peripheral edema  Musculoskeletal: No deformities, no cyanosis or clubbing  Neuro: alert, non focal  Skin: Warm, no lesions or rashes     Assessment & Plan:  Chronic cough PFT with mild restriction but normal airflows. Cough is almost certainly all upper airway, being exacerbated largely by GERD - continue nexium qd

## 2011-03-10 NOTE — Assessment & Plan Note (Signed)
PFT with mild restriction but normal airflows. Cough is almost certainly all upper airway, being exacerbated largely by GERD - continue nexium qd

## 2011-03-10 NOTE — Progress Notes (Signed)
PFT done today. 

## 2011-03-10 NOTE — Patient Instructions (Signed)
Please continue your Nexium 40mg  daily Your breathing testing was normal.  Follow with Dr Debby Bud as planned Follow with Dr Delton Coombes if you have any new problems

## 2011-03-14 ENCOUNTER — Telehealth: Payer: Self-pay | Admitting: Emergency Medicine

## 2011-03-14 MED ORDER — ESOMEPRAZOLE MAGNESIUM 40 MG PO CPDR: 40.0000 mg | DELAYED_RELEASE_CAPSULE | Freq: Every day | ORAL | Status: DC

## 2011-03-14 NOTE — Telephone Encounter (Signed)
Returning call can be reached at [phone].Katherine Best

## 2011-03-14 NOTE — Telephone Encounter (Signed)
Pt aware rx was sent. Nothing further was needed 

## 2011-03-14 NOTE — Telephone Encounter (Signed)
[email or web address]---rx has been sent 

## 2011-03-16 ENCOUNTER — Telehealth: Payer: Self-pay | Admitting: Emergency Medicine

## 2011-03-16 MED ORDER — ESOMEPRAZOLE MAGNESIUM 40 MG PO CPDR: 40.0000 mg | DELAYED_RELEASE_CAPSULE | Freq: Every day | ORAL | Status: DC

## 2011-03-16 NOTE — Telephone Encounter (Signed)
Looks like rx was already sent on 03/14/11.  Called and spoke with [facility].  They never received this electronic refill.  Therefore gave a verbal ok for # 90 x 3 refills. Called and spoke with pt. Pt aware.

## 2011-05-02 ENCOUNTER — Encounter: Payer: Self-pay | Admitting: *Deleted

## 2011-05-02 ENCOUNTER — Telehealth: Payer: Self-pay | Admitting: *Deleted

## 2011-05-02 NOTE — Telephone Encounter (Signed)
Letter prepared and faxed.

## 2011-05-02 NOTE — Telephone Encounter (Signed)
[facility] states they faxed over letter for Medical Clearance for 02.06.13 Sx scheduled on Mon 02.04.13. Caller states she needs response by 4:00pm today or they will have to reschedule surgery. [fax [phone]] Please advise on status of form.

## 2011-05-03 NOTE — Telephone Encounter (Signed)
Letter prepared, faxed & I informed caller of this.

## 2011-07-03 ENCOUNTER — Other Ambulatory Visit: Payer: Self-pay | Admitting: Internal Medicine

## 2011-07-03 DIAGNOSIS — Z1231 Encounter for screening mammogram for malignant neoplasm of breast: Secondary | ICD-10-CM

## 2011-07-04 ENCOUNTER — Telehealth: Payer: Self-pay | Admitting: Internal Medicine

## 2011-07-04 MED ORDER — LIDOCAINE HCL 2 % EX GEL: CUTANEOUS | Status: AC

## 2011-07-04 MED ORDER — LIDOCAINE HCL 2 % EX GEL: CUTANEOUS | Status: DC

## 2011-07-04 NOTE — Telephone Encounter (Signed)
Xylocaine 2% jelly 1 tube apply as needed

## 2011-07-04 NOTE — Telephone Encounter (Signed)
Patient advised.

## 2011-07-04 NOTE — Telephone Encounter (Signed)
Addended by: Annett Fabian on: 07/04/2011 01:58 PM   Modules accepted: Orders

## 2011-07-04 NOTE — Telephone Encounter (Signed)
Patient reports that she has a painful external hemorrhoid.  She has some OTC product that aren't helping.  She is advised to try sitz bath, [facility].  Dr Leone Payor can we call her in some analpram or other hemorrhoid cream?

## 2011-07-06 ENCOUNTER — Other Ambulatory Visit: Payer: Self-pay

## 2011-07-06 NOTE — Telephone Encounter (Signed)
Pt contacted and got this refill on lidocaine 2% jelly already from [facility] so [facility] must have sent the request in error.  Pt has a different mail order pharmacy now.

## 2011-07-10 ENCOUNTER — Telehealth: Payer: Self-pay | Admitting: Internal Medicine

## 2011-07-10 NOTE — Telephone Encounter (Signed)
Patient has been using lidocaine cream and has tried sitz bath with no improvement.  She will come in tomorrow at 2:15 with Dr Leone Payor

## 2011-07-11 ENCOUNTER — Ambulatory Visit (INDEPENDENT_AMBULATORY_CARE_PROVIDER_SITE_OTHER): Payer: Medicare Other | Admitting: Internal Medicine

## 2011-07-11 VITALS — BP 134/68 | HR 88 | Ht 63.0 in | Wt 181.0 lb

## 2011-07-11 DIAGNOSIS — K645 Perianal venous thrombosis: Secondary | ICD-10-CM

## 2011-07-11 MED ORDER — HYDROCORTISONE 2.5 % RE CREA: TOPICAL_CREAM | Freq: Two times a day (BID) | RECTAL | Status: AC | PRN

## 2011-07-11 NOTE — Progress Notes (Signed)
  Subjective:    Patient ID: Katherine Best, female    DOB: Dec 21, 1933, 78 y.o.   MRN: [id number]  HPI Elderly white woman is known to me due to IBS problems. In general less than under control though it will flare at times. She is here today with a painful hemorrhoid. She had called the office, she was advised sitz bath since she is using tox screen. We called in Xylocaine jelly. It is better but still a problem. She had taken some Metamucil because she was fearful of getting constipated recently and then developed diarrhea and thinks that the hemorrhoid flared around that time. She is hoping to get this under control so she can go to [place] in a couple of weeks.   Medications, allergies, past medical history, past surgical history, family history and social history are reviewed and updated in the EMR.   Review of Systems She had an epidural injection for her spine today, she was to know she can resume a locks exam. She's also had bilateral hammertoe and bilateral ankle surgeries since last seen here in 2011.    Objective:   Physical Exam General:  NAD, obese Rectal: Female staff present, inspection is carried out in the left lateral decubitus position. There is a thrombosed hemorrhoid in the right anal area. It is mildly tender, does not fluctuate. Digital exam shows some associated anal stenosis but no rectal mass.      Assessment & Plan:   1. Thrombosed hemorrhoid   This is improving. I think conservative treatment remains appropriate. Will add ProctoCream HC 2.5% as needed. She will continue sitz baths and as needed Xylocaine jelly. If it fails to resolve or worsens could consider surgical evaluation for possible excision though would like to avoid that.

## 2011-07-11 NOTE — Patient Instructions (Signed)
Continue your present regimen.  Stop using the Tucks wipes.  Per Dr. Leone Payor it is ok to re-start the Mobic.  We have sent the following medications to your pharmacy for you to pick up at your convenience: Protocreme creme [facility]

## 2011-07-13 ENCOUNTER — Other Ambulatory Visit: Payer: Self-pay

## 2011-07-13 MED ORDER — METOPROLOL TARTRATE 50 MG PO TABS: 50.0000 mg | ORAL_TABLET | Freq: Two times a day (BID) | ORAL | Status: DC

## 2011-07-28 ENCOUNTER — Other Ambulatory Visit: Payer: Self-pay | Admitting: *Deleted

## 2011-07-28 MED ORDER — LEVOTHYROXINE SODIUM 25 MCG PO TABS: 25.0000 ug | ORAL_TABLET | Freq: Every day | ORAL | Status: DC

## 2011-08-07 ENCOUNTER — Ambulatory Visit: Payer: Medicare Other | Admitting: Internal Medicine

## 2011-08-11 ENCOUNTER — Ambulatory Visit
Admission: RE | Admit: 2011-08-11 | Discharge: 2011-08-11 | Disposition: A | Discharge status: Home / Self Care | Payer: Medicare Other | Source: Ambulatory Visit | Attending: Internal Medicine | Admitting: Internal Medicine

## 2011-08-11 DIAGNOSIS — Z1231 Encounter for screening mammogram for malignant neoplasm of breast: Secondary | ICD-10-CM

## 2011-08-28 ENCOUNTER — Encounter: Payer: Self-pay | Admitting: Internal Medicine

## 2011-08-28 ENCOUNTER — Other Ambulatory Visit (INDEPENDENT_AMBULATORY_CARE_PROVIDER_SITE_OTHER): Payer: Medicare Other

## 2011-08-28 ENCOUNTER — Ambulatory Visit (INDEPENDENT_AMBULATORY_CARE_PROVIDER_SITE_OTHER): Payer: Medicare Other | Admitting: Internal Medicine

## 2011-08-28 VITALS — BP 150/80 | HR 78 | Temp 98.8°F | Resp 16 | Wt 183.0 lb

## 2011-08-28 DIAGNOSIS — R0609 Other forms of dyspnea: Secondary | ICD-10-CM

## 2011-08-28 DIAGNOSIS — R5383 Other fatigue: Secondary | ICD-10-CM

## 2011-08-28 DIAGNOSIS — J439 Emphysema, unspecified: Secondary | ICD-10-CM

## 2011-08-28 DIAGNOSIS — I10 Essential (primary) hypertension: Secondary | ICD-10-CM

## 2011-08-28 DIAGNOSIS — J438 Other emphysema: Secondary | ICD-10-CM

## 2011-08-28 DIAGNOSIS — R6889 Other general symptoms and signs: Secondary | ICD-10-CM

## 2011-08-28 DIAGNOSIS — E039 Hypothyroidism, unspecified: Secondary | ICD-10-CM

## 2011-08-28 DIAGNOSIS — R5381 Other malaise: Secondary | ICD-10-CM

## 2011-08-28 LAB — COMPREHENSIVE METABOLIC PANEL
AST: 34 U/L (ref 0–37)
BUN: 18 mg/dL (ref 6–23)
CO2: 26 mEq/L (ref 19–32)
Calcium: 9.3 mg/dL (ref 8.4–10.5)
Chloride: 101 mEq/L (ref 96–112)
Creatinine, Ser: 0.6 mg/dL (ref 0.4–1.2)
GFR: 107.57 mL/min (ref >60.00)
Glucose, Bld: 104 mg/dL — ABNORMAL HIGH (ref 70–99)

## 2011-08-29 ENCOUNTER — Other Ambulatory Visit: Payer: Self-pay

## 2011-08-29 DIAGNOSIS — J439 Emphysema, unspecified: Secondary | ICD-10-CM | POA: Insufficient documentation

## 2011-08-29 LAB — T4, FREE: Free T4: 0.8 ng/dL (ref 0.60–1.60)

## 2011-08-29 LAB — TSH: TSH: 1.83 u[IU]/mL (ref 0.35–5.50)

## 2011-08-29 LAB — VITAMIN B12: Vitamin B-12: 690 pg/mL (ref 211–911)

## 2011-08-29 MED ORDER — ESOMEPRAZOLE MAGNESIUM 40 MG PO CPDR: 40.0000 mg | DELAYED_RELEASE_CAPSULE | Freq: Every day | ORAL | Status: DC

## 2011-08-29 NOTE — Assessment & Plan Note (Signed)
Part of her DE may be related to poor DLCO, i.e. emphysema

## 2011-08-29 NOTE — Assessment & Plan Note (Signed)
BP Readings from Last 3 Encounters:  08/28/11 150/80  07/11/11 134/68  03/10/11 120/72   Generally well controlled but elevated today.  Plan No change in medical regimen at this time.

## 2011-08-29 NOTE — Progress Notes (Signed)
Subjective:    Patient ID: Katherine Best, female    DOB: [birth year], 78 y.o.   MRN: [id number]  HPI Mrs. Mota presents for evaluation of marked fatigue - "her get up and go, got up and went." She has no focal complaints. She has made a good recovery from foot surgery. She continues to have chronic leg pain. He fatigue has been progressive over several months - she has stopped sewing. She denies depression. On questioning she does admit to DOE so that she gets SOB walking from one room in the house to another. She denies any chest pain or discomfort. Chart reviewed: PFTs Dec 18, '12: VC 73%, FEV1 86%, DLCO 60%. No cardiac testing in EMR.  Past Medical History  Diagnosis Date  . Unspecified venous (peripheral) insufficiency   . Tarsal tunnel syndrome   . Abdominal pain, right lower quadrant   . Other specified gastritis without mention of hemorrhage   . Obesity, unspecified   . Liver function study, abnormal   . Menopausal syndrome   . Diverticulosis   . External hemorrhoid   . Anxiety   . Interstitial cystitis   . Bursitis   . Hypothyroid   . IBS (irritable bowel syndrome)   . Morton's neuroma   . Hypertension   . Hyperlipidemia   . Colon polyp    Past Surgical History  Procedure Date  . Abdominal hysterectomy     with BSO  . Tonsillectomy   . Foot surgery   . Oophorectomy   . Left tarsal tunnel release     sept '11 (Dr Lestine Box)  . Colonoscopy     multiple  . Hammer toe surgery     bilateral  . Ankle surgery     bilateral   Family History  Problem Relation Age of Onset  . Cancer Mother     Breast  . Hypertension Father   . Colon cancer     History   Social History  . Marital Status: Married    Spouse Name: N/A    Number of Children: N/A  . Years of Education: N/A   Occupational History  . Not on file.   Social History Main Topics  . Smoking status: Former Smoker -- 1.0 packs/day for 35 years    Types: Cigarettes    Quit date: 03/27/2002  .  Smokeless tobacco: Never Used  . Alcohol Use: No  . Drug Use: No  . Sexually Active: Not on file   Other Topics Concern  . Not on file   Social History Narrative   HSGMarried '573 sons- '60, '61, '64, grandchildren 8 (7 girls, 1 boy)Lives independently with husbandPatient is a former smokerAlcohol use- yes socially not in yearsIllicit Drug use- noDrapery Neurosurgeon (still looking for work)       Review of Systems Constitutional:  Negative for fever, chills, activity change and unexpected weight change.  HEENT:  Negative for hearing loss, ear pain, congestion, neck stiffness and postnasal drip. Negative for sore throat or swallowing problems. Negative for dental complaints.   Eyes: Negative for vision loss or change in visual acuity.  Respiratory: Negative for chest tightness and wheezing. Positive for DOE.   Cardiovascular: Negative for chest pain or palpitations. Positive for decreased exercise tolerance Gastrointestinal: No change in bowel habit. No bloating or gas. No reflux or indigestion Genitourinary: Negative for urgency, frequency, flank pain and difficulty urinating.  Musculoskeletal: Negative for myalgias, back pain, arthralgias. Positive for leg pain and gait problem.  Neurological: Negative  for dizziness, tremors, weakness and headaches.  Hematological: Negative for adenopathy.  Psychiatric/Behavioral: Negative for behavioral problems and dysphoric mood.       Objective:   Physical Exam Filed Vitals:   08/28/11 1637  BP: 150/80  Pulse: 78  Temp: 98.8 F (37.1 C)  Resp: 16   Wt Readings from Last 3 Encounters:  08/28/11 183 lb (83.008 kg)  07/11/11 181 lb (82.101 kg)  03/10/11 185 lb (83.915 kg)   Gen'l- WNWD elderly white woman in no distress HEENT- C&S clear Cor- RRR, no JVD, normal peripheral pulses Pulm- no increased WOB at rest, no rales or wheezes Ext - no visible deformity of ankle or LE Neuro - A&O x 3, CN II-XII grossly intact.          Assessment & Plan:  Fatigue - patient with progressive fatigue w/o focal symptoms. Chart review-PFTs c/w emphysema. She denies chest pain but does have several cardiac risk factors: age, HTN, lipids.  Plan - Lab to r/o metabolic causes  2 D echo to evaluate LV function.

## 2011-08-31 ENCOUNTER — Other Ambulatory Visit: Payer: Self-pay

## 2011-08-31 MED ORDER — ESOMEPRAZOLE MAGNESIUM 40 MG PO CPDR: 40.0000 mg | DELAYED_RELEASE_CAPSULE | Freq: Every day | ORAL | Status: DC

## 2011-09-01 ENCOUNTER — Ambulatory Visit (HOSPITAL_COMMUNITY): Payer: Medicare Other | Attending: Internal Medicine | Admitting: Radiology

## 2011-09-01 DIAGNOSIS — I517 Cardiomegaly: Secondary | ICD-10-CM | POA: Insufficient documentation

## 2011-09-01 DIAGNOSIS — R0989 Other specified symptoms and signs involving the circulatory and respiratory systems: Secondary | ICD-10-CM | POA: Insufficient documentation

## 2011-09-01 DIAGNOSIS — R5383 Other fatigue: Secondary | ICD-10-CM | POA: Insufficient documentation

## 2011-09-01 DIAGNOSIS — E785 Hyperlipidemia, unspecified: Secondary | ICD-10-CM | POA: Insufficient documentation

## 2011-09-01 DIAGNOSIS — Z87891 Personal history of nicotine dependence: Secondary | ICD-10-CM | POA: Insufficient documentation

## 2011-09-01 DIAGNOSIS — R5381 Other malaise: Secondary | ICD-10-CM | POA: Insufficient documentation

## 2011-09-01 DIAGNOSIS — J438 Other emphysema: Secondary | ICD-10-CM | POA: Insufficient documentation

## 2011-09-01 DIAGNOSIS — R0609 Other forms of dyspnea: Secondary | ICD-10-CM | POA: Insufficient documentation

## 2011-09-01 DIAGNOSIS — E669 Obesity, unspecified: Secondary | ICD-10-CM | POA: Insufficient documentation

## 2011-09-01 DIAGNOSIS — I10 Essential (primary) hypertension: Secondary | ICD-10-CM | POA: Insufficient documentation

## 2011-09-01 NOTE — Progress Notes (Signed)
Echocardiogram performed.  

## 2011-09-03 ENCOUNTER — Encounter: Payer: Self-pay | Admitting: Internal Medicine

## 2011-09-07 ENCOUNTER — Encounter: Payer: Self-pay | Admitting: Internal Medicine

## 2011-09-07 ENCOUNTER — Ambulatory Visit: Payer: Medicare Other | Admitting: Internal Medicine

## 2011-09-07 ENCOUNTER — Ambulatory Visit (INDEPENDENT_AMBULATORY_CARE_PROVIDER_SITE_OTHER): Payer: Medicare Other | Admitting: Internal Medicine

## 2011-09-07 VITALS — BP 134/78 | HR 78 | Temp 98.0°F | Resp 16 | Ht 62.0 in | Wt 183.0 lb

## 2011-09-07 DIAGNOSIS — I519 Heart disease, unspecified: Secondary | ICD-10-CM

## 2011-09-07 NOTE — Patient Instructions (Addendum)
You may have increased shortness of breath and decreased exercise tolerance due to "diastolic dysfunction." Treatment is maximize management of Blood pressure and you are on the right medications.  Plan - will refer you to Dr. Tenny Craw, cardiology, to be sure we are doing all that we should do.

## 2011-09-10 NOTE — Progress Notes (Signed)
  Subjective:    Patient ID: Katherine Best, female    DOB: 1933/11/16, 78 y.o.   MRN: [id number]  HPI Patient recently seen for evaluation of fatigue. All her lab work came back as normal. She had a 2D echo with grade I diastolic dysfunction. She is here to discuss this problem. Her condition has not changed.  PMH, FamHx and SocHx reviewed for any changes and relevance.     Review of Systems System review is negative for any constitutional, cardiac, pulmonary, GI or neuro symptoms or complaints other than as described in the HPI.     Objective:   Physical Exam Filed Vitals:   09/07/11 0922  BP: 134/78  Pulse: 78  Temp: 98 F (36.7 C)  Resp: 16   Gen'l- older white woman in no distress Cor- RRR Pulm - normal respirations Neuro - A&O x 3       Assessment & Plan:

## 2011-09-10 NOTE — Assessment & Plan Note (Signed)
Patient with DOE and generalized fatigue. Labs did nor reveal metabolic derangement as cause. Echo with probably diastolic dysfunction. She also has emphysema. Spent time explaining cardiac function and dysfunction to her.   Plan - Continue present medications and good BP control  Cardiology consult - she is an established patient of Dr. Tenny Craw.  (greater than 50% of 30 min visit spent on education and counseling)

## 2011-09-11 ENCOUNTER — Telehealth: Payer: Self-pay | Admitting: Internal Medicine

## 2011-09-11 NOTE — Telephone Encounter (Signed)
Received 3 pages from [facility]. Sent to Dr. Debby Bud. sd 09/11/11

## 2011-09-25 ENCOUNTER — Ambulatory Visit (INDEPENDENT_AMBULATORY_CARE_PROVIDER_SITE_OTHER): Payer: Medicare Other | Admitting: Internal Medicine

## 2011-09-25 ENCOUNTER — Encounter: Payer: Self-pay | Admitting: Internal Medicine

## 2011-09-25 ENCOUNTER — Ambulatory Visit (INDEPENDENT_AMBULATORY_CARE_PROVIDER_SITE_OTHER)
Admission: RE | Admit: 2011-09-25 | Discharge: 2011-09-25 | Disposition: A | Discharge status: Home / Self Care | Payer: Medicare Other | Source: Ambulatory Visit | Attending: Internal Medicine | Admitting: Internal Medicine

## 2011-09-25 VITALS — BP 132/78 | HR 99 | Ht 62.0 in | Wt 180.0 lb

## 2011-09-25 DIAGNOSIS — R0602 Shortness of breath: Secondary | ICD-10-CM

## 2011-09-25 LAB — BASIC METABOLIC PANEL
CO2: 27 mEq/L (ref 19–32)
Calcium: 9.2 mg/dL (ref 8.4–10.5)
Creatinine, Ser: 0.6 mg/dL (ref 0.4–1.2)

## 2011-09-25 LAB — CBC WITH DIFFERENTIAL/PLATELET
Eosinophils Relative: 2.6 % (ref 0.0–5.0)
MCV: 92.6 fl (ref 78.0–100.0)
Monocytes Absolute: 0.7 10*3/uL (ref 0.1–1.0)
Neutrophils Relative %: 71.5 % (ref 43.0–77.0)
Platelets: 370 10*3/uL (ref 150.0–400.0)
WBC: 9.2 10*3/uL (ref 4.5–10.5)

## 2011-09-25 LAB — BRAIN NATRIURETIC PEPTIDE: Pro B Natriuretic peptide (BNP): 38 pg/mL (ref 0.0–100.0)

## 2011-09-25 MED ORDER — IOHEXOL 300 MG/ML  SOLN
80.0000 mL | Freq: Once | INTRAMUSCULAR | Status: AC | PRN
  Administered 2011-09-25: 80 mL via INTRAVENOUS

## 2011-09-25 NOTE — Progress Notes (Addendum)
HPI Patient is a 77 year old who is followed by M [facility]ins.  Hd echo that showed Gr I diastolic dysfunction.  COmplains of fatigue, SOB.  She has had this for a long time but   She has noticed that this has gotten worse over the past couple months.  She notes more SOB with activity.  Denies PND.  No orthopnea.  Allergies  Allergen Reactions  . Sulfonamide Derivatives     REACTION: Nausea    Current Outpatient Prescriptions  Medication Sig Dispense Refill  . ALPRAZolam (XANAX) 0.25 MG tablet Take 0.25 mg by mouth as needed.        . aspirin 81 MG tablet Take 81 mg by mouth daily.        . benzonatate (TESSALON) 100 MG capsule Take 100 mg by mouth 3 (three) times daily as needed.        . calcium carbonate (TUMS) 500 MG chewable tablet Chew 1 tablet by mouth 2 (two) times daily.        . ciprofloxacin (CIPRO) 100 MG tablet Take 125 mg by mouth daily.       . esomeprazole (NEXIUM) 40 MG capsule Take 1 capsule (40 mg total) by mouth daily before breakfast.  90 capsule  3  . estradiol (ESTRACE) 1 MG tablet Take 1 tablet (1 mg total) by mouth daily.  90 tablet  3  . fesoterodine (TOVIAZ) 4 MG TB24 Take 4 mg by mouth daily.        . fluticasone (FLONASE) 50 MCG/ACT nasal spray Place 2 sprays into the nose 2 (two) times daily as needed.        . HYDROcodone-acetaminophen (NORCO) 5-325 MG per tablet Take 1 tablet by mouth as needed. For back pain       . hyoscyamine (LEVBID) 0.375 MG 12 hr tablet Take 0.375 mg by mouth every 12 (twelve) hours as needed.        . levothyroxine (LEVOXYL) 25 MCG tablet Take 1 tablet (25 mcg total) by mouth daily.  90 tablet  3  . lidocaine (XYLOCAINE JELLY) 2 % jelly Apply to rectal area daily prn  30 mL  0  . loperamide (IMODIUM A-D) 2 MG tablet Take 2 mg by mouth as needed.        . loratadine (LORADAMED) 10 MG tablet Take 10 mg by mouth daily.        . losartan-hydrochlorothiazide (HYZAAR) 50-12.5 MG per tablet Take 1 tablet by mouth daily.  30 tablet  3  . meloxicam  (MOBIC) 15 MG tablet Take 15 mg by mouth daily.      . metoprolol (LOPRESSOR) 50 MG tablet Take 1 tablet (50 mg total) by mouth 2 (two) times daily.  180 tablet  1  . MULTIPLE VITAMIN PO Take by mouth daily.        . potassium chloride SA (K-DUR,KLOR-CON) 20 MEQ tablet TAKE 1 TABLET ONCE A DAY  90 tablet  2  . promethazine-codeine (PHENERGAN WITH CODEINE) 6.25-10 MG/5ML syrup Take 5 mLs by mouth every 4 (four) hours as needed.        . Witch Hazel (TUCKS EX) Apply topically as directed.      . DISCONTD: esomeprazole (NEXIUM) 40 MG capsule Take 1 capsule (40 mg total) by mouth 2 (two) times daily.  60 capsule  0  . DISCONTD: fluticasone (FLONASE) 50 MCG/ACT nasal spray Place 2 sprays into the nose 2 (two) times daily.  16 g  12      Past Medical History  Diagnosis Date  . Unspecified venous (peripheral) insufficiency   . Tarsal tunnel syndrome   . Abdominal pain, right lower quadrant   . Other specified gastritis without mention of hemorrhage   . Obesity, unspecified   . Liver function study, abnormal   . Menopausal syndrome   . Diverticulosis   . External hemorrhoid   . Anxiety   . Interstitial cystitis   . Bursitis   . Hypothyroid   . IBS (irritable bowel syndrome)   . Morton's neuroma   . Hypertension   . Hyperlipidemia   . Colon polyp     Past Surgical History  Procedure Date  . Abdominal hysterectomy     with BSO  . Tonsillectomy   . Foot surgery   . Oophorectomy   . Left tarsal tunnel release     sept '11 (Dr Bednarz)  . Colonoscopy     multiple  . Hammer toe surgery     bilateral  . Ankle surgery     bilateral    Family History  Problem Relation Age of Onset  . Cancer Mother     Breast  . Hypertension Father   . Colon cancer      History   Social History  . Marital Status: Married    Spouse Name: N/A    Number of Children: N/A  . Years of Education: N/A   Occupational History  . Not on file.   Social History Main Topics  . Smoking status:  Former Smoker -- 1.0 packs/day for 35 years    Types: Cigarettes    Quit date: 03/27/2002  . Smokeless tobacco: Never Used  . Alcohol Use: No  . Drug Use: No  . Sexually Active: Not on file   Other Topics Concern  . Not on file   Social History Narrative   HSGMarried '573 sons- '60, '61, '64, grandchildren 8 (7 girls, 1 boy)Lives independently with husbandPatient is a former smokerAlcohol use- yes socially not in yearsIllicit Drug use- noDrapery Seamstress (still looking for work)    Review of Systems:  All systems reviewed.  They are negative to the above problem except as previously stated.  Vital Signs: BP 132/78  Pulse 99  Ht 5' 2" (1.575 m)  Wt 180 lb (81.647 kg)  BMI 32.92 kg/m2  Physical Exam Patient is in NAD   O2 sat 94 goes to 90 to 92% with walking. HEENT:  Normocephalic, atraumatic. EOMI, PERRLA.  Neck: JVP is normal. No thyromegaly. No bruits.  Lungs: clear to auscultation. No rales no wheezes.  Heart: Regular rate and rhythm. Normal S1, S2. No S3.   No significant murmurs. PMI not displaced.  Abdomen:  Supple, nontender. Normal bowel sounds. No masses. No hepatomegaly.  Extremities:   Good distal pulses throughout. Tr lower extremity edema.  Musculoskeletal :moving all extremities.  Neuro:   alert and oriented x3.  CN II-XII grossly intact.  EKG:  Ectopic atrial rhythm with SR   /PACs.  Poor R wave progression. Assessment and Plan:  1.  Dyspnea  Very concerning.  Echo with only mild diastolic dysfunction.  Concerned she may have had a PE.  Drove to [place]  Will get CT.  Labs today.  2.  HTN.  Continue meds.    3.  HL   WIll need to review.  Not on medical Rx.   Addendum:  Chest CT showed no PE but did show extensive calcification of LAD.  Would recomm R and   L heart cath to define anatomy and pressures.  Patient understands and agrees to proceed.  

## 2011-09-25 NOTE — Patient Instructions (Signed)
Lab work today Will call you with results.  Non-Cardiac CT scanning, (CAT scanning), is a noninvasive, special x-ray that produces cross-sectional images of the body using x-rays and a computer. CT scans help physicians diagnose and treat medical conditions. For some CT exams, a contrast material is used to enhance visibility in the area of the body being studied. CT scans provide greater clarity and reveal more details than regular x-ray exams.

## 2011-09-26 ENCOUNTER — Telehealth: Payer: Self-pay | Admitting: Internal Medicine

## 2011-09-26 DIAGNOSIS — R0602 Shortness of breath: Secondary | ICD-10-CM

## 2011-09-26 NOTE — Telephone Encounter (Signed)
Called patient with chest CT results and advised that Dr.Ross would like her to have a left heart catherization. [facility] has no openings until 7/8. Case set up for 7/8 for 930 arrival for a 1030 case with Dr.McAlhaney. Patient aware NPO after midnight and to have PT check tomorrow. Per Dr.Ross she was instructed to go to ER with increased SOB or chest pain.

## 2011-09-26 NOTE — Telephone Encounter (Signed)
New msg Pt wants to know test results 

## 2011-09-27 ENCOUNTER — Other Ambulatory Visit (INDEPENDENT_AMBULATORY_CARE_PROVIDER_SITE_OTHER): Payer: Medicare Other

## 2011-09-27 DIAGNOSIS — R0602 Shortness of breath: Secondary | ICD-10-CM

## 2011-09-27 LAB — PROTIME-INR
INR: 1 ratio (ref 0.8–1.0)
Prothrombin Time: 10.9 s (ref 10.2–12.4)

## 2011-10-02 ENCOUNTER — Inpatient Hospital Stay (HOSPITAL_BASED_OUTPATIENT_CLINIC_OR_DEPARTMENT_OTHER)
Admission: AD | Admit: 2011-10-02 | Discharge: 2011-10-02 | Disposition: A | Discharge status: Home / Self Care | Payer: Medicare Other | Source: Ambulatory Visit | Attending: Cardiovascular Disease | Admitting: Cardiovascular Disease

## 2011-10-02 ENCOUNTER — Encounter (HOSPITAL_BASED_OUTPATIENT_CLINIC_OR_DEPARTMENT_OTHER)
Admission: AD | Disposition: A | Discharge status: Home / Self Care | Payer: Self-pay | Source: Ambulatory Visit | Attending: Cardiovascular Disease

## 2011-10-02 ENCOUNTER — Encounter (HOSPITAL_COMMUNITY)
Admission: RE | Discharge status: Absent without leave | Payer: Self-pay | Source: Ambulatory Visit | Attending: Cardiovascular Disease

## 2011-10-02 ENCOUNTER — Other Ambulatory Visit: Payer: Self-pay | Admitting: *Deleted

## 2011-10-02 ENCOUNTER — Ambulatory Visit (HOSPITAL_COMMUNITY)
Admission: RE | Admit: 2011-10-02 | Discharge: 2011-10-02 | Discharge status: Against Medical Advice | Payer: Medicare Other | Source: Ambulatory Visit | Attending: Cardiovascular Disease | Admitting: Cardiovascular Disease

## 2011-10-02 DIAGNOSIS — E669 Obesity, unspecified: Secondary | ICD-10-CM | POA: Insufficient documentation

## 2011-10-02 DIAGNOSIS — R0989 Other specified symptoms and signs involving the circulatory and respiratory systems: Secondary | ICD-10-CM | POA: Insufficient documentation

## 2011-10-02 DIAGNOSIS — R0609 Other forms of dyspnea: Secondary | ICD-10-CM | POA: Insufficient documentation

## 2011-10-02 DIAGNOSIS — I2510 Atherosclerotic heart disease of native coronary artery without angina pectoris: Secondary | ICD-10-CM

## 2011-10-02 DIAGNOSIS — I10 Essential (primary) hypertension: Secondary | ICD-10-CM | POA: Insufficient documentation

## 2011-10-02 DIAGNOSIS — R0602 Shortness of breath: Secondary | ICD-10-CM

## 2011-10-02 SURGERY — JV LEFT HEART CATHETERIZATION WITH CORONARY ANGIOGRAM
Anesthesia: Moderate Sedation

## 2011-10-02 MED ORDER — ACETAMINOPHEN 325 MG PO TABS: 650.0000 mg | ORAL_TABLET | ORAL | Status: DC | PRN

## 2011-10-02 MED ORDER — SODIUM CHLORIDE 0.9 % IV SOLN: INTRAVENOUS | Status: DC

## 2011-10-02 NOTE — H&P (View-Only) (Signed)
HPI Patient is a 78 year old who is followed by [facility] Pickle.  Hd echo that showed Gr I diastolic dysfunction.  COmplains of fatigue, SOB.  She has had this for a long time but   She has noticed that this has gotten worse over the past couple months.  She notes more SOB with activity.  Denies PND.  No orthopnea.  Allergies  Allergen Reactions  . Sulfonamide Derivatives     REACTION: Nausea    Current Outpatient Prescriptions  Medication Sig Dispense Refill  . ALPRAZolam (XANAX) 0.25 MG tablet Take 0.25 mg by mouth as needed.        Marland Kitchen aspirin 81 MG tablet Take 81 mg by mouth daily.        . benzonatate (TESSALON) 100 MG capsule Take 100 mg by mouth 3 (three) times daily as needed.        . calcium carbonate (TUMS) 500 MG chewable tablet Chew 1 tablet by mouth 2 (two) times daily.        . ciprofloxacin (CIPRO) 100 MG tablet Take 125 mg by mouth daily.       Marland Kitchen esomeprazole (NEXIUM) 40 MG capsule Take 1 capsule (40 mg total) by mouth daily before breakfast.  90 capsule  3  . estradiol (ESTRACE) 1 MG tablet Take 1 tablet (1 mg total) by mouth daily.  90 tablet  3  . fesoterodine (TOVIAZ) 4 MG TB24 Take 4 mg by mouth daily.        . fluticasone (FLONASE) 50 MCG/ACT nasal spray Place 2 sprays into the nose 2 (two) times daily as needed.        Marland Kitchen HYDROcodone-acetaminophen (NORCO) 5-325 MG per tablet Take 1 tablet by mouth as needed. For back pain       . hyoscyamine (LEVBID) 0.375 MG 12 hr tablet Take 0.375 mg by mouth every 12 (twelve) hours as needed.        Marland Kitchen levothyroxine (LEVOXYL) 25 MCG tablet Take 1 tablet (25 mcg total) by mouth daily.  90 tablet  3  . lidocaine (XYLOCAINE JELLY) 2 % jelly Apply to rectal area daily prn  30 mL  0  . loperamide (IMODIUM A-D) 2 MG tablet Take 2 mg by mouth as needed.        . loratadine (LORADAMED) 10 MG tablet Take 10 mg by mouth daily.        Marland Kitchen losartan-hydrochlorothiazide (HYZAAR) 50-12.5 MG per tablet Take 1 tablet by mouth daily.  30 tablet  3  . meloxicam  (MOBIC) 15 MG tablet Take 15 mg by mouth daily.      . metoprolol (LOPRESSOR) 50 MG tablet Take 1 tablet (50 mg total) by mouth 2 (two) times daily.  180 tablet  1  . MULTIPLE VITAMIN PO Take by mouth daily.        . potassium chloride SA (K-DUR,KLOR-CON) 20 MEQ tablet TAKE 1 TABLET ONCE A DAY  90 tablet  2  . promethazine-codeine (PHENERGAN WITH CODEINE) 6.25-10 MG/5ML syrup Take 5 mLs by mouth every 4 (four) hours as needed.        [name] (TUCKS EX) Apply topically as directed.      Marland Kitchen DISCONTD: esomeprazole (NEXIUM) 40 MG capsule Take 1 capsule (40 mg total) by mouth 2 (two) times daily.  60 capsule  0  . DISCONTD: fluticasone (FLONASE) 50 MCG/ACT nasal spray Place 2 sprays into the nose 2 (two) times daily.  16 g  12  Past Medical History  Diagnosis Date  . Unspecified venous (peripheral) insufficiency   . Tarsal tunnel syndrome   . Abdominal pain, right lower quadrant   . Other specified gastritis without mention of hemorrhage   . Obesity, unspecified   . Liver function study, abnormal   . Menopausal syndrome   . Diverticulosis   . External hemorrhoid   . Anxiety   . Interstitial cystitis   . Bursitis   . Hypothyroid   . IBS (irritable bowel syndrome)   . Morton's neuroma   . Hypertension   . Hyperlipidemia   . Colon polyp     Past Surgical History  Procedure Date  . Abdominal hysterectomy     with BSO  . Tonsillectomy   . Foot surgery   . Oophorectomy   . Left tarsal tunnel release     sept '11 (Dr Lestine Box)  . Colonoscopy     multiple  . Hammer toe surgery     bilateral  . Ankle surgery     bilateral    Family History  Problem Relation Age of Onset  . Cancer Mother     Breast  . Hypertension Father   . Colon cancer      History   Social History  . Marital Status: Married    Spouse Name: N/A    Number of Children: N/A  . Years of Education: N/A   Occupational History  . Not on file.   Social History Main Topics  . Smoking status:  Former Smoker -- 1.0 packs/day for 35 years    Types: Cigarettes    Quit date: 03/27/2002  . Smokeless tobacco: Never Used  . Alcohol Use: No  . Drug Use: No  . Sexually Active: Not on file   Other Topics Concern  . Not on file   Social History Narrative   HSGMarried '573 sons- '60, '61, '64, grandchildren 8 (7 girls, 1 boy)Lives independently with husbandPatient is a former smokerAlcohol use- yes socially not in yearsIllicit Drug use- noDrapery Neurosurgeon (still looking for work)    Review of Systems:  All systems reviewed.  They are negative to the above problem except as previously stated.  Vital Signs: BP 132/78  Pulse 99  Ht 5\' 2"  (1.575 m)  Wt 180 lb (81.647 kg)  BMI 32.92 kg/m2  Physical Exam Patient is in NAD   O2 sat 94 goes to 90 to 92% with walking. HEENT:  Normocephalic, atraumatic. EOMI, PERRLA.  Neck: JVP is normal. No thyromegaly. No bruits.  Lungs: clear to auscultation. No rales no wheezes.  Heart: Regular rate and rhythm. Normal S1, S2. No S3.   No significant murmurs. PMI not displaced.  Abdomen:  Supple, nontender. Normal bowel sounds. No masses. No hepatomegaly.  Extremities:   Good distal pulses throughout. Tr lower extremity edema.  Musculoskeletal :moving all extremities.  Neuro:   alert and oriented x3.  CN II-XII grossly intact.  EKG:  Ectopic atrial rhythm with SR   /PACs.  Poor R wave progression. Assessment and Plan:  1.  Dyspnea  Very concerning.  Echo with only mild diastolic dysfunction.  Concerned she may have had a PE.  [facility]  Will get CT.  Labs today.  2.  HTN.  Continue meds.    3.  HL   WIll need to review.  Not on medical Rx.   Addendum:  Chest CT showed no PE but did show extensive calcification of LAD.  Would recomm R and  L heart cath to define anatomy and pressures.  Patient understands and agrees to proceed.

## 2011-10-02 NOTE — Interval H&P Note (Signed)
History and Physical Interval Note:  10/02/2011 11:20 AM  Katherine Best  has presented today for surgery, with the diagnosis of cp  The various methods of treatment have been discussed with the patient and family. After consideration of risks, benefits and other options for treatment, the patient has consented to  Procedure(s) (LRB): [facility] LEFT HEART CATHETERIZATION WITH CORONARY ANGIOGRAM (N/A) as a surgical intervention .  The patient's history has been reviewed, patient examined, no change in status, stable for surgery.  I have reviewed the patients' chart and labs.  Questions were answered to the patient's satisfaction.     MCALHANY,CHRISTOPHER

## 2011-10-02 NOTE — Progress Notes (Signed)
Bedrest begins @ 1200, tegaderm dressing applied by Army Melia RN.

## 2011-10-02 NOTE — Progress Notes (Signed)
Dishcharge instructions given per MD protocol.  Pt verbalized understanding.  No problems or discomfort noted at this time. Pt ot car via  Wheelchair.

## 2011-10-02 NOTE — CV Procedure (Signed)
   Cardiac Catheterization Operative Report  Katherine Best [id number] 7/8/201311:53 AM [name], MD  Procedure Performed:  1. Left Heart Catheterization 2. Right heart catheterization 3. Selective Coronary Angiography 4. Left ventricular angiogram  Operator: Verne Carrow, MD  Indication: Dyspnea, chest CT with calcification of coronary arteries.                                   Procedure Details: The risks, benefits, complications, treatment options, and expected outcomes were discussed with the patient. The patient and/or family concurred with the proposed plan, giving informed consent. The patient was brought to the cath lab after IV hydration was begun and oral premedication was given. The patient was further sedated with Versed and Fentanyl. The right groin was prepped and draped in the usual manner. Using the modified Seldinger access technique, a 4 French sheath was placed in the right femoral artery. Standard diagnostic catheters were used to perform selective coronary angiography. A pigtail catheter was used to perform a left ventricular angiogram.  There were no immediate complications. The patient was taken to the recovery area in stable condition.   Hemodynamic Findings: Central aortic pressure: 140/62 Left ventricular pressure: 150/15/23 RA: 4 RV: 30/4/6 PA: 29/12 (mean 20) PCWP: 8 CO: 5.1 L/min CI: 2.8 L/min/m2 Ao sat: 91% PA sat: 65%  Angiographic Findings:  Left main: No obstructive disease noted.   Left Anterior Descending Artery: Large caliber vessel that courses to the apex. The proximal vessel is calcified with mild 20% plaque. The mid and distal vessel have mild luminal irregularities.    Circumflex Artery: Moderate sized vessel. No obstructive disease noted.   Right Coronary Artery: Large dominant vessel with 30% eccentric proximal stenosis. The remainder of the vessel has mild luminal irregularities.   Left Ventricular Angiogram:  LVEF=65%.   Impression: 1. Mild non-obstructive CAD with calcification noted in the proximal LAD 2. Normal LV systolic function 3. Normal right sided filling pressures.   Recommendations: Medical management.        Complications:  None. The patient tolerated the procedure well.

## 2011-10-03 LAB — POCT I-STAT 3, ART BLOOD GAS (G3+)
O2 Saturation: 87 %
O2 Saturation: 91 %
TCO2: 27 mmol/L (ref 0–100)
pCO2 arterial: 39.1 mmHg (ref 35.0–45.0)
pCO2 arterial: 42.7 mmHg (ref 35.0–45.0)
pH, Arterial: 7.383 (ref 7.350–7.450)
pO2, Arterial: 55 mmHg — ABNORMAL LOW (ref 80.0–100.0)
pO2, Arterial: 62 mmHg — ABNORMAL LOW (ref 80.0–100.0)

## 2011-10-03 LAB — POCT I-STAT 3, VENOUS BLOOD GAS (G3P V)
Acid-Base Excess: 2 mmol/L (ref 0.0–2.0)
O2 Saturation: 65 %
TCO2: 29 mmol/L (ref 0–100)

## 2011-10-06 ENCOUNTER — Ambulatory Visit (INDEPENDENT_AMBULATORY_CARE_PROVIDER_SITE_OTHER): Payer: Medicare Other | Admitting: Internal Medicine

## 2011-10-06 DIAGNOSIS — J438 Other emphysema: Secondary | ICD-10-CM

## 2011-10-06 DIAGNOSIS — J439 Emphysema, unspecified: Secondary | ICD-10-CM

## 2011-10-06 DIAGNOSIS — R05 Cough: Secondary | ICD-10-CM

## 2011-10-06 LAB — PULMONARY FUNCTION TEST

## 2011-10-06 NOTE — Progress Notes (Signed)
PFT done today. 

## 2011-10-11 ENCOUNTER — Encounter: Payer: Self-pay | Admitting: Emergency Medicine

## 2011-10-13 ENCOUNTER — Telehealth: Payer: Self-pay | Admitting: Internal Medicine

## 2011-10-13 NOTE — Telephone Encounter (Signed)
Please return call to patient [phone] regarding test results

## 2011-10-13 NOTE — Telephone Encounter (Signed)
Patient called wanting to know results of PFT's 10/06/11.Patient was told results not available.Dr.Ross out of office today.Message sent to Dr.Ross.

## 2011-10-16 NOTE — Telephone Encounter (Signed)
Called patient with results of PFT's. Advised that Dr.Ross has not reviewed yet. She has seen Dr.Byrum in the past. Has appointment this week for groin check post cath with PA. Advised will call her back again after Dr.Ross reviews PFT results.

## 2011-10-19 ENCOUNTER — Ambulatory Visit (INDEPENDENT_AMBULATORY_CARE_PROVIDER_SITE_OTHER): Payer: Medicare Other | Admitting: Nurse Practitioner

## 2011-10-19 ENCOUNTER — Encounter: Payer: Self-pay | Admitting: Nurse Practitioner

## 2011-10-19 VITALS — BP 128/60 | HR 78 | Ht 62.0 in | Wt 184.8 lb

## 2011-10-19 DIAGNOSIS — E785 Hyperlipidemia, unspecified: Secondary | ICD-10-CM

## 2011-10-19 DIAGNOSIS — R0609 Other forms of dyspnea: Secondary | ICD-10-CM

## 2011-10-19 DIAGNOSIS — I2510 Atherosclerotic heart disease of native coronary artery without angina pectoris: Secondary | ICD-10-CM | POA: Insufficient documentation

## 2011-10-19 DIAGNOSIS — I10 Essential (primary) hypertension: Secondary | ICD-10-CM

## 2011-10-19 MED ORDER — OMEPRAZOLE 20 MG PO CPDR: 20.0000 mg | DELAYED_RELEASE_CAPSULE | Freq: Every day | ORAL | Status: DC

## 2011-10-19 MED ORDER — PRAVASTATIN SODIUM 20 MG PO TABS: 20.0000 mg | ORAL_TABLET | Freq: Every evening | ORAL | Status: DC

## 2011-10-19 NOTE — Patient Instructions (Addendum)
Your physician has recommended you make the following change in your medication: START Pravachol 20, take one tablet each night. Start omeprazole 20 mg once daily.    These Rx have been sent into your pharmacy.    Your physician recommends that you schedule a follow-up appointment in: 6 months with Dr. Tenny Craw

## 2011-10-19 NOTE — Progress Notes (Addendum)
Patient ID: Katherine Best, female    DOB: 1933-07-15, 78 y.o.   MRN: [id number]   Patient Name: Katherine Best Date of Encounter: 10/19/2011  Primary Care Provider:  [name], MD Primary Cardiologist:  Lovina Reach, MD  Patient Profile  78 y/o female with h/o DOE s/p recent cath who presents for f/u.  Problem List   Past Medical History  Diagnosis Date  . Unspecified venous (peripheral) insufficiency   . Tarsal tunnel syndrome   . Abdominal pain, right lower quadrant   . Other specified gastritis without mention of hemorrhage   . Obesity, unspecified   . Liver function study, abnormal   . Menopausal syndrome   . Diverticulosis   . External hemorrhoid   . Anxiety   . Interstitial cystitis   . Bursitis   . Hypothyroid   . IBS (irritable bowel syndrome)   . Morton's neuroma   . Hypertension   . Hyperlipidemia   . Colon polyp   . Dyspnea     a. 09/2011 CTA Chest: No PE, Ca2+ cors;  b. 09/2011 R&L heart Cath: Relatively nl R heart pressures, nl co/ci, nonobs cad, nl EF.   Past Surgical History  Procedure Date  . Abdominal hysterectomy     with BSO  . Tonsillectomy   . Foot surgery   . Oophorectomy   . Left tarsal tunnel release     sept '11 (Dr Lestine Box)  . Colonoscopy     multiple  . Hammer toe surgery     bilateral  . Ankle surgery     bilateral    Allergies  Allergies  Allergen Reactions  . Sulfonamide Derivatives     REACTION: Nausea   HPI 78 y/o female with the above problem list.  She was last seen earlier this month w/ complaints of exertional dyspnea.  CTA of the chest was done and showed no PE but significant coronary calcification and thus she was set up for R & L heart cath.  This showed nonobs cad and relatively nl R heart pressures.  EF was nl.  Since cath, she has cont to have DOE though says overall, she is "fair."  She has had no chest pain. Her activity is limited by back and foot pain more-so than by DOE.  She admits to being fairly  sedentary as a result of her back and foot pain, though she used to go to the gym an exercise in the pool.  She plans to begin doing this again, as soon as she can get a well-fitting pair of water shoes.  She denies pnd, orthopnea, dizziness, syncope, edema, or early satiety.  Home Medications  Prior to Admission medications   Medication Sig Start Date End Date Taking? Authorizing Provider  ALPRAZolam (XANAX) 0.25 MG tablet Take 0.25 mg by mouth as needed.     Yes Historical Provider, MD  aspirin 81 MG tablet Take 81 mg by mouth daily.     Yes Historical Provider, MD  benzonatate (TESSALON) 100 MG capsule Take 100 mg by mouth 3 (three) times daily as needed.     Yes Historical Provider, MD  calcium carbonate (TUMS) 500 MG chewable tablet Chew 1 tablet by mouth 2 (two) times daily.     Yes Historical Provider, MD  ciprofloxacin (CIPRO) 100 MG tablet Take 125 mg by mouth daily.    Yes Historical Provider, MD  estradiol (ESTRACE) 1 MG tablet Take 1 tablet (1 mg total) by mouth daily. 12/02/10 12/02/11  Yes Jacques Navy, MD  fesoterodine (TOVIAZ) 4 MG TB24 Take 4 mg by mouth daily.     Yes Historical Provider, MD  fluticasone (FLONASE) 50 MCG/ACT nasal spray Place 2 sprays into the nose 2 (two) times daily as needed.   02/03/11 02/03/12 Yes Leslye Peer, MD  HYDROcodone-acetaminophen (NORCO) 5-325 MG per tablet Take 1 tablet by mouth as needed. For back pain    Yes Historical Provider, MD  levothyroxine (LEVOXYL) 25 MCG tablet Take 1 tablet (25 mcg total) by mouth daily. 07/28/11  Yes Jacques Navy, MD  lidocaine (XYLOCAINE JELLY) 2 % jelly Apply to rectal area daily prn 07/04/11 07/03/12 Yes Iva Boop, MD  loperamide (IMODIUM A-D) 2 MG tablet Take 2 mg by mouth as needed.     Yes Historical Provider, MD  loratadine (LORADAMED) 10 MG tablet Take 10 mg by mouth daily.     Yes Historical Provider, MD  losartan-hydrochlorothiazide (HYZAAR) 50-12.5 MG per tablet Take 1 tablet by mouth daily. 12/19/10  12/19/11 Yes Jacques Navy, MD  meloxicam (MOBIC) 15 MG tablet Take 15 mg by mouth daily.   Yes Historical Provider, MD  metoprolol (LOPRESSOR) 50 MG tablet Take 1 tablet (50 mg total) by mouth 2 (two) times daily. 07/13/11  Yes Jacques Navy, MD  MULTIPLE VITAMIN PO Take by mouth daily.     Yes Historical Provider, MD  potassium chloride SA (K-DUR,KLOR-CON) 20 MEQ tablet TAKE 1 TABLET ONCE A DAY 02/01/11  Yes Jacques Navy, MD  Witch Hazel (TUCKS [place]) Apply topically as directed.   Yes Historical Provider, MD  hyoscyamine (LEVBID) 0.375 MG 12 hr tablet Take 0.375 mg by mouth every 12 (twelve) hours as needed.      Historical Provider, MD  omeprazole (PRILOSEC) 20 MG capsule Take 1 capsule (20 mg total) by mouth daily. 10/19/11 10/18/12  Cassell Clement, MD  pravastatin (PRAVACHOL) 20 MG tablet Take 1 tablet (20 mg total) by mouth every evening. 10/19/11 10/18/12  Cassell Clement, MD  promethazine-codeine [facility] WITH CODEINE) 6.25-10 MG/5ML syrup Take 5 mLs by mouth every 4 (four) hours as needed.      Historical Provider, MD    Review of Systems  Chronic, stable DOE with foot and back pain.  She also has a several wk h/o diffuse body itching and has been seen by PCP for this.  All other systems reviewed and are otherwise negative except as noted above.   Physical Exam  Blood pressure 128/60, pulse 78, height 5\' 2"  (1.575 m), weight 184 lb 12.8 oz (83.825 kg), SpO2 87-92% on RA General: Pleasant, NAD Psych: Normal affect. Neuro: Alert and oriented X 3. Moves all extremities spontaneously. HEENT: Normal  Neck: Supple without bruits or JVD. Lungs:  Resp regular and unlabored, CTA. Heart: RRR no s3, s4, or murmurs. Abdomen: Soft, non-tender, non-distended, BS + x 4.  Extremities: No clubbing, cyanosis or edema. DP/PT/Radials 2+ and equal bilaterally.  Assessment & Plan  1.  DOE/Nonobstructive CAD:  S/p R&L heart cath with reassuring findings.  Cont current med Rx.  Of note, pt  does have mild CAD and was previously on a statin but came off b/c of complaints of dyspnea, which have not changed since coming off of pravachol. She wishes to resume and we have sent in a Rx for pravachol 20 to her pharmacy.  We also reviewed her recent PFT's, which showed mild restrictive disease and reduced DLCO.  She will f/u wth Dr. Delton Coombes as  she cont to have baseline hypoxia with RA sats in the high 80's to low 90's.  2. HTN:  Stable.  3. HL:  Adding back pravachol as above.  Should have f/u lipids/lft's in 8 wks.  Nicolasa Ducking, NP 10/19/2011, 10:57 AM

## 2011-10-20 NOTE — Telephone Encounter (Signed)
Paula, High 80's to low 90's.  I hadn't realized that tech entered the 47 and thus it was pulled into my template.  Pt was planning to f/u w/ Byrum and thought she had appt though I now see that she doesn't have anything on the books. Thayer Ohm

## 2011-10-20 NOTE — Telephone Encounter (Signed)
Katherine Best,  Please confirm O2 sat from visit.  Was it 87% on room air?

## 2011-10-23 ENCOUNTER — Telehealth: Payer: Self-pay | Admitting: *Deleted

## 2011-10-23 NOTE — Telephone Encounter (Signed)
Message copied by Awilda Bill on Mon Oct 23, 2011  3:51 PM ------      Message from: Nicolasa Ducking R      Created: Mon Oct 23, 2011  3:44 PM       Lanae Boast,      I was able to get Ms. Fellman an appt to see pulmonary on this Wed @ 2:30 PM.  Would you pls call her to let her know.  With all of her SOB, I spoke with Dr. Tenny Craw and we felt she should see him sooner rather than later.  If it doesn't work for her she can call and reschedule.      Thanks,      Thayer Ohm

## 2011-10-23 NOTE — Telephone Encounter (Signed)
Spoke to patient, patient approved appointment date and time.  Vista Mink, CMA

## 2011-10-25 ENCOUNTER — Ambulatory Visit (INDEPENDENT_AMBULATORY_CARE_PROVIDER_SITE_OTHER): Payer: Medicare Other | Admitting: Emergency Medicine

## 2011-10-25 ENCOUNTER — Other Ambulatory Visit: Payer: Self-pay | Admitting: *Deleted

## 2011-10-25 ENCOUNTER — Encounter: Payer: Self-pay | Admitting: Emergency Medicine

## 2011-10-25 ENCOUNTER — Telehealth: Payer: Self-pay | Admitting: Emergency Medicine

## 2011-10-25 VITALS — BP 120/62 | HR 90 | Temp 97.5°F | Ht 62.0 in | Wt 184.0 lb

## 2011-10-25 DIAGNOSIS — R5383 Other fatigue: Secondary | ICD-10-CM | POA: Insufficient documentation

## 2011-10-25 DIAGNOSIS — R059 Cough, unspecified: Secondary | ICD-10-CM

## 2011-10-25 DIAGNOSIS — R5381 Other malaise: Secondary | ICD-10-CM

## 2011-10-25 DIAGNOSIS — R05 Cough: Secondary | ICD-10-CM

## 2011-10-25 NOTE — Assessment & Plan Note (Signed)
Her cardiac w/u has been reassuring. Cleda Daub without any evidence AFL, shows restriction likely due to obesity. CT scan chest without ILD or PE. I suspect her dyspnea and fatigue relate to obesity and deconditioning.  - walking oximetry today to prove or disprove desaturation.,  - will encourage her to go back to exercise program

## 2011-10-25 NOTE — Patient Instructions (Addendum)
Your breathing tests do not show evidence for emphysema Your CT scan of the chest does not show evidence for lung scarring Your walking oximetry today shows that your oxygen does not drop when you walk.  You may resume your exercise program - this should help with your breathing and your fatigue.  Follow with Dr Delton Coombes in 4 months or sooner if you have any problems.

## 2011-10-25 NOTE — Telephone Encounter (Signed)
Patient requesting refill at [facility] for xanax...--- per Dr. Delton Coombes primary provider needs to fill.  Thank You

## 2011-10-25 NOTE — Progress Notes (Signed)
Subjective:    Patient ID: Katherine Best, female    DOB: Mar 24, 1934, 78 y.o.   MRN: [id number]  HPI 78 yo former smoker (35 pk-yrs), hx of HTN, hyperlipidemia. She is referred by Dr Debby Bud for cough that started about 2 months ago. She was well, has no real cough prior to this episode. The cough has been dry - never productive. No CP. She has a lot of indigestion, a lot of burping. She was started on omeprazole 10/30, hasn't really noticed a difference in the cough. She tells me that the cough is worse with talking, will often happen after eating certain foods. She has a globus sensation, clears throat often. She has some sneezing, no real congestion. She is on loratadine, takes actifed prn. Has been rx with abx and also prednisone burst and taper along with codeine cough syrup and tessalon perles. Of note she had an ARB/HCTZ added about 2.5 months ago   ROV 03/10/11 -- Returns for her cough. It is much better since PPI was added. Still bothers her some but better. Non-productive. No PND. She had abd pain when she was taking nexium bid so changed to qam. PFt today with no evidence for AFL.   ROV 10/25/11 -- follow up for cough. Improved when started on PPI - nexium, now changing to omeprazole. Restriction on spirometry. Since last time she underwent R and L cath that were reassuring for fatigue, some exertional SOB.  Notes from Flavia Shipper at cardiology say that she had documented hypoxemia. She is 96% on RA here at rest. She describes an episode since last time when she ate pork, felt swelling in hands and "throat closing".  Repeat PFT confirm restriction, no evidence AFL, low DLCO that corrects to normal for Va. CT-PA 09/25/11 was without PE.      Objective:   Physical Exam Filed Vitals:   10/25/11 1427  BP: 120/62  Pulse: 90  Temp: 97.5 F (36.4 C)   Gen: Pleasant, slightly overwt woman, in no distress,  normal affect  ENT: No lesions,  mouth clear,  oropharynx clear, no postnasal  drip  Neck: No JVD, no TMG, no carotid bruits  Lungs: No use of accessory muscles, no dullness to percussion, clear without rales or rhonchi  Cardiovascular: RRR, heart sounds normal, no murmur or gallops, no peripheral edema  Musculoskeletal: No deformities, no cyanosis or clubbing  Neuro: alert, non focal  Skin: Warm, no lesions or rashes    CT scan of the chest 09/25/11 --  Comparison: 12/19/2004  Findings: No axillary or supraclavicular adenopathy. There is no  mediastinal or hilar adenopathy. Calcifications involving the LAD  coronary artery noted. No pericardial or pleural effusion. No  airspace consolidation or atelectasis. The pulmonary arteries  appear patent. No evidence for acute embolus.  No suspicious pulmonary parenchymal nodule or mass noted. The  tracheobronchial tree appears normal. Review of the visualized  osseous structures is significant for mild thoracic spondylosis.  Limited imaging through the upper abdomen shows a nodule within the  right adrenal gland measuring 1.4 cm and 15.8 HU. This is  unchanged from previous exam and likely represents a benign  adenoma.  IMPRESSION:  1. Negative for acute pulmonary embolus.  2. Prominent coronary artery calcifications.       Assessment & Plan:  Chronic cough Resolved on PPI  Fatigue Her cardiac w/u has been reassuring. Cleda Daub without any evidence AFL, shows restriction likely due to obesity. CT scan chest without ILD or PE. I  suspect her dyspnea and fatigue relate to obesity and deconditioning.  - walking oximetry today to prove or disprove desaturation.,  - will encourage her to go back to exercise program

## 2011-10-25 NOTE — Telephone Encounter (Signed)
Called and spoke with patient, informed patient that per Dr. Delton Coombes she needs to have xanax refilled through her primary MD.  Patient verbalized understanding and nothing further needed at this time.

## 2011-10-25 NOTE — Assessment & Plan Note (Signed)
Resolved on PPI 

## 2011-10-26 ENCOUNTER — Other Ambulatory Visit: Payer: Self-pay

## 2011-10-27 ENCOUNTER — Other Ambulatory Visit: Payer: Self-pay | Admitting: *Deleted

## 2011-10-27 MED ORDER — ALPRAZOLAM 0.25 MG PO TABS: 0.2500 mg | ORAL_TABLET | ORAL | Status: DC | PRN

## 2011-10-27 NOTE — Telephone Encounter (Signed)
Med refill called to pharmacy alprazolam

## 2011-11-06 ENCOUNTER — Other Ambulatory Visit: Payer: Self-pay | Admitting: *Deleted

## 2011-11-07 ENCOUNTER — Other Ambulatory Visit: Payer: Self-pay | Admitting: *Deleted

## 2011-11-07 MED ORDER — POTASSIUM CHLORIDE CRYS ER 20 MEQ PO TBCR: 20.0000 meq | EXTENDED_RELEASE_TABLET | Freq: Every day | ORAL | Status: DC

## 2011-11-07 NOTE — Telephone Encounter (Signed)
Refill KLOR-CON  Sent to [facility]

## 2011-11-09 ENCOUNTER — Institutional Professional Consult (permissible substitution): Payer: Medicare Other | Admitting: Internal Medicine

## 2011-12-06 ENCOUNTER — Ambulatory Visit (INDEPENDENT_AMBULATORY_CARE_PROVIDER_SITE_OTHER): Payer: Medicare Other | Admitting: *Deleted

## 2011-12-06 DIAGNOSIS — Z23 Encounter for immunization: Secondary | ICD-10-CM

## 2011-12-18 ENCOUNTER — Other Ambulatory Visit: Payer: Self-pay | Admitting: *Deleted

## 2011-12-18 MED ORDER — ESTRADIOL 1 MG PO TABS: 1.0000 mg | ORAL_TABLET | Freq: Every day | ORAL | Status: DC

## 2011-12-20 ENCOUNTER — Encounter: Payer: Self-pay | Admitting: Internal Medicine

## 2011-12-20 ENCOUNTER — Other Ambulatory Visit (INDEPENDENT_AMBULATORY_CARE_PROVIDER_SITE_OTHER): Payer: Medicare Other

## 2011-12-20 ENCOUNTER — Telehealth: Payer: Self-pay | Admitting: Internal Medicine

## 2011-12-20 ENCOUNTER — Ambulatory Visit (INDEPENDENT_AMBULATORY_CARE_PROVIDER_SITE_OTHER): Payer: Medicare Other | Admitting: Internal Medicine

## 2011-12-20 VITALS — BP 132/70 | HR 75 | Temp 98.2°F | Resp 16 | Wt 180.0 lb

## 2011-12-20 DIAGNOSIS — Z862 Personal history of diseases of the blood and blood-forming organs and certain disorders involving the immune mechanism: Secondary | ICD-10-CM

## 2011-12-20 DIAGNOSIS — L299 Pruritus, unspecified: Secondary | ICD-10-CM

## 2011-12-20 DIAGNOSIS — Z8639 Personal history of other endocrine, nutritional and metabolic disease: Secondary | ICD-10-CM

## 2011-12-20 LAB — HEPATIC FUNCTION PANEL
AST: 40 U/L — ABNORMAL HIGH (ref 0–37)
Total Bilirubin: 0.5 mg/dL (ref 0.3–1.2)

## 2011-12-20 LAB — CBC WITH DIFFERENTIAL/PLATELET
Basophils Relative: 1.1 % (ref 0.0–3.0)
Eosinophils Relative: 2.7 % (ref 0.0–5.0)
HCT: 37.1 % (ref 36.0–46.0)
Hemoglobin: 12.2 g/dL (ref 12.0–15.0)
Lymphs Abs: 1.9 10*3/uL (ref 0.7–4.0)
Monocytes Relative: 8.3 % (ref 3.0–12.0)
Neutro Abs: 5.5 10*3/uL (ref 1.4–7.7)
RDW: 13.6 % (ref 11.5–14.6)
WBC: 8.4 10*3/uL (ref 4.5–10.5)

## 2011-12-20 LAB — TSH: TSH: 2.58 u[IU]/mL (ref 0.35–5.50)

## 2011-12-20 NOTE — Telephone Encounter (Signed)
Caller: Bitha/Patient; Patient Name: Katherine Best; PCP: [name] (Adults only); Best Callback Phone Number: [phone]; Reason for call: Generalized itching: no rash.  Onset: 12/15/11  Afebrile.  Occasional twitch of legs or arms  On maintenance Cipro.  States no relief from Benadryl TID 12/19/11.  Xanax helped her to sleep. Advised to see MD within 24 hours for severe or persistent itching that interferes with ability to carry out usual activities or sleep per Skin Lesions guideline. No open or acute  appointments remain. Appointment scheduled by office staff/Nancy for 1330 12/20/11 with Dr Debby Bud.

## 2011-12-20 NOTE — Progress Notes (Signed)
Subjective:    Patient ID: Katherine Best, female    DOB: 01-Jun-1933, 78 y.o.   MRN: [id number]  HPI Mrs. Mishra presents with diffuse pruritis that has been unresponsive to benadryl, xanax, oatmeal bath. Nothing has helped. She has had no rash and she has had no new contact allergen exposure. She has no known liver or thyroid disease. She has not excoriated herself.  Chronic foot pain - this has been a real problem and she has had ortho consults and podiatry consults and continues to have activity limiting foot pain.l  / Past Medical History  Diagnosis Date  . Unspecified venous (peripheral) insufficiency   . Tarsal tunnel syndrome   . Abdominal pain, right lower quadrant   . Other specified gastritis without mention of hemorrhage   . Obesity, unspecified   . Liver function study, abnormal   . Menopausal syndrome   . Diverticulosis   . External hemorrhoid   . Anxiety   . Interstitial cystitis   . Bursitis   . Hypothyroid   . IBS (irritable bowel syndrome)   . Morton's neuroma   . Hypertension   . Hyperlipidemia   . Colon polyp   . Dyspnea     a. 09/2011 CTA Chest: No PE, Ca2+ cors;  b. 09/2011 R&L heart Cath: Relatively nl R heart pressures, nl co/ci, nonobs cad, nl EF.   Past Surgical History  Procedure Date  . Abdominal hysterectomy     with BSO  . Tonsillectomy   . Foot surgery   . Oophorectomy   . Left tarsal tunnel release     sept '11 (Dr Lestine Box)  . Colonoscopy     multiple  . Hammer toe surgery     bilateral  . Ankle surgery     bilateral   Family History  Problem Relation Age of Onset  . Cancer Mother     Breast  . Hypertension Father   . Colon cancer     History   Social History  . Marital Status: Married    Spouse Name: N/A    Number of Children: N/A  . Years of Education: N/A   Occupational History  . Not on file.   Social History Main Topics  . Smoking status: Former Smoker -- 1.0 packs/day for 35 years    Types: Cigarettes    Quit  date: 03/27/2002  . Smokeless tobacco: Never Used  . Alcohol Use: No  . Drug Use: No  . Sexually Active: Not on file   Other Topics Concern  . Not on file   Social History Narrative   HSGMarried '573 sons- '60, '61, '64, grandchildren 8 (7 girls, 1 boy)Lives independently with husbandPatient is a former smokerAlcohol use- yes socially not in yearsIllicit Drug use- noDrapery Neurosurgeon (still looking for work)   Current Outpatient Prescriptions on File Prior to Visit  Medication Sig Dispense Refill  . ALPRAZolam (XANAX) 0.25 MG tablet Take 1 tablet (0.25 mg total) by mouth as needed.  30 tablet  2  . aspirin 81 MG tablet Take 81 mg by mouth daily.        . calcium carbonate (TUMS) 500 MG chewable tablet Chew 1 tablet by mouth 2 (two) times daily.        . ciprofloxacin (CIPRO) 100 MG tablet Take 125 mg by mouth daily.       Marland Kitchen estradiol (ESTRACE) 1 MG tablet Take 1 tablet (1 mg total) by mouth daily.  90 tablet  3  .  fesoterodine (TOVIAZ) 4 MG TB24 Take 4 mg by mouth daily.        Marland Kitchen HYDROcodone-acetaminophen (NORCO) 5-325 MG per tablet Take 1 tablet by mouth as needed. For back pain       . hyoscyamine (LEVBID) 0.375 MG 12 hr tablet Take 0.375 mg by mouth every 12 (twelve) hours as needed.        Marland Kitchen levothyroxine (LEVOXYL) 25 MCG tablet Take 1 tablet (25 mcg total) by mouth daily.  90 tablet  3  . lidocaine (XYLOCAINE JELLY) 2 % jelly Apply to rectal area daily prn  30 mL  0  . loperamide (IMODIUM A-D) 2 MG tablet Take 2 mg by mouth as needed.        . loratadine (LORADAMED) 10 MG tablet Take 10 mg by mouth daily.        Marland Kitchen losartan-hydrochlorothiazide (HYZAAR) 50-12.5 MG per tablet Take 1 tablet by mouth daily.  30 tablet  3  . meloxicam (MOBIC) 15 MG tablet Take 15 mg by mouth daily.      . metoprolol (LOPRESSOR) 50 MG tablet Take 1 tablet (50 mg total) by mouth 2 (two) times daily.  180 tablet  1  . MULTIPLE VITAMIN PO Take by mouth daily.        Marland Kitchen omeprazole (PRILOSEC) 20 MG capsule  Take 1 capsule (20 mg total) by mouth daily.  90 capsule  3  . potassium chloride SA (K-DUR,KLOR-CON) 20 MEQ tablet Take 1 tablet (20 mEq total) by mouth daily.  90 tablet  3  . pravastatin (PRAVACHOL) 20 MG tablet Take 1 tablet (20 mg total) by mouth every evening.  90 tablet  3  . promethazine-codeine (PHENERGAN WITH CODEINE) 6.25-10 MG/5ML syrup Take 5 mLs by mouth every 4 (four) hours as needed.        [name] (TUCKS [place]) Apply topically as directed.      . benzonatate (TESSALON) 100 MG capsule Take 100 mg by mouth 3 (three) times daily as needed.        . fluticasone (FLONASE) 50 MCG/ACT nasal spray Place 2 sprays into the nose 2 (two) times daily as needed.        Marland Kitchen DISCONTD: fluticasone (FLONASE) 50 MCG/ACT nasal spray Place 2 sprays into the nose 2 (two) times daily.  16 g  12      Review of Systems System review is negative for any constitutional, cardiac, pulmonary, GI or neuro symptoms or complaints other than as described in the HPI.     Objective:   Physical Exam Filed Vitals:   12/20/11 1344  BP: 132/70  Pulse: 75  Temp: 98.2 F (36.8 C)  Resp: 16   gen'l- overweight white woman in no distress Pulm - normal respirations Cor- RRR Derm - no visible rash or lesions       Assessment & Plan:  1. Foot and leg pain - she has had previous evaluations but still has foot and leg pain, often worse after being up all day.  Plan refer to Dr. Darrick Penna and his fellows at sports medicine - Tuesday, Oct 1 at 15:00  2. Pruritis - no rash. H/o abnormal liver functions  Plan Medications: loratadine 10 mg twice a day; Ranitidine 150 mg twice a day.  Lab: liver function, TSH, CBCD

## 2011-12-20 NOTE — Patient Instructions (Addendum)
1. Foot and leg pain - she has had previous evaluations but still has foot and leg pain, often worse after being up all day.  Plan refer to Dr. Darrick Penna and his fellows at sports medicine - Tuesday, Oct 1 at 15:00. [address] [facility]. Wear sneakers and shorts.  2. Pruritis - no rash. H/o abnormal liver functions  Plan Medications: loratadine 10 mg twice a day; Ranitidine 150 mg twice a day.  Lab: liver function, TSH, CBCD

## 2011-12-26 ENCOUNTER — Ambulatory Visit (INDEPENDENT_AMBULATORY_CARE_PROVIDER_SITE_OTHER): Payer: Medicare Other | Admitting: Family Medicine

## 2011-12-26 VITALS — BP 140/70 | Ht 62.0 in | Wt 183.0 lb

## 2011-12-26 DIAGNOSIS — M4800 Spinal stenosis, site unspecified: Secondary | ICD-10-CM

## 2011-12-26 NOTE — Patient Instructions (Addendum)
It was nice to meet you!  I'm sorry you are having pain in your feet and legs. Start taking the Nortriptyline again.  If you find it keeps you awake, take it in the mornings. Hopefully, the new inserts will help with the pain. We are also going to start physical therapy to help get your strength up.  They should call you in the next week or so. Follow up in 4-6 weeks if things are not improving.

## 2011-12-26 NOTE — Progress Notes (Signed)
  Subjective:    Patient ID: Katherine Best, female    DOB: 1933/12/08, 78 y.o.   MRN: [id number]  HPI Ms. Schar is here today for a new patient appt for bilateral foot and leg pain.  She states this has been going on for at least a year, she identifies having varicose vein surgery as a trigger.  She reports that her lower legs ache after 10 minutes of standing and 2 hours after removing her compression stockings.  The foot pain is poorly described, but she states "it feels like the bones are moving."  Also describes some numbness most prominently in the balls of her feet.  Also reports that her feet start "dragging" after walking around her driveway.  She did receive a series of injections in her feet for Morton's Neuroma about a year ago which did not help the pain.  Taking Mobic daily.  Foot pain is improved by wearing shoes with orthotics; worse with walking barefoot.  Has had multiple surgeries on her feet including a neuroma removal 30 years ago, straightening of bilateral 2nd toes and left great toe, and bunionectomy of left great toe.  Past Medical History: Significant for hypertension.  No diabetes.  Recent work up of shortness of breath did not reveal any cardiac or pulmonary abnormalities per patient report.    Review of Systems     Objective:   Physical Exam Ext: 2+ DP pulses bilaterally, 1+ pitting edema to mid shin bilaterally, venous stasis changes bilaterally Right foot: pronation with flat longitudinal and transverse arches; sensation intact to light touch; 5/5 strength in dorsiflexion; 1 beat of ankle clonus; hallucis rigiditis  Left foot: pronation with flat longitudinal and transverse arches; sensation intact to light touch; 5/5 strength in dorsiflexion; 1 beat of ankle clonus; 4th and 5th toes starting to develop hammer toes     Assessment & Plan:  Lower extremity pain: Differential includes venous stasis and referred pain from feet.  Also some concern for vascular vs  neurovascular etiology given history concerning for claudication.  Will focus on foot pain and physical therapy to help with balance and core strength.  Continue compression stockings.  If no improvement with these measures at follow up could consider repeat imagining of spine (lumbar MRI in 2010 showed spondolysis and diffuse disc pathology without stenosis) and/or ABIs.  Foot pain: Loss of foot structure vs plantar fasciitis vs neuropathy.  Likely from the loss of basic foot structure given improvement with supportive shoes and inserts.  Neuropathy is also possible given the multiple foot infections she has received.  She is not diabetic and B12 was within normal limits earlier this year.  Provided inserts with arch supports and metatarsal pads.  Patient to restart nortriptyline as she was on this in the past to help with any neuropathic component.

## 2012-01-09 ENCOUNTER — Ambulatory Visit: Payer: Medicare Other | Admitting: Internal Medicine

## 2012-01-09 ENCOUNTER — Ambulatory Visit: Payer: Medicare Other

## 2012-01-10 ENCOUNTER — Ambulatory Visit (INDEPENDENT_AMBULATORY_CARE_PROVIDER_SITE_OTHER): Payer: Medicare Other | Admitting: *Deleted

## 2012-01-10 DIAGNOSIS — Z23 Encounter for immunization: Secondary | ICD-10-CM

## 2012-01-17 ENCOUNTER — Other Ambulatory Visit: Payer: Self-pay | Admitting: *Deleted

## 2012-01-17 MED ORDER — LOSARTAN POTASSIUM-HCTZ 50-12.5 MG PO TABS: 1.0000 | ORAL_TABLET | Freq: Every day | ORAL | Status: DC

## 2012-01-17 MED ORDER — METOPROLOL TARTRATE 50 MG PO TABS: 50.0000 mg | ORAL_TABLET | Freq: Two times a day (BID) | ORAL | Status: DC

## 2012-01-24 ENCOUNTER — Ambulatory Visit (INDEPENDENT_AMBULATORY_CARE_PROVIDER_SITE_OTHER): Payer: Medicare Other | Admitting: Sports Medicine

## 2012-01-24 ENCOUNTER — Encounter: Payer: Self-pay | Admitting: Sports Medicine

## 2012-01-24 ENCOUNTER — Ambulatory Visit
Admission: RE | Admit: 2012-01-24 | Discharge: 2012-01-24 | Disposition: A | Discharge status: Home / Self Care | Payer: Medicare Other | Source: Ambulatory Visit | Attending: Sports Medicine | Admitting: Sports Medicine

## 2012-01-24 VITALS — BP 129/78 | HR 69 | Ht 62.0 in | Wt 183.0 lb

## 2012-01-24 DIAGNOSIS — M545 Low back pain, unspecified: Secondary | ICD-10-CM

## 2012-01-24 MED ORDER — GABAPENTIN 100 MG PO CAPS: ORAL_CAPSULE | ORAL | Status: DC

## 2012-01-24 NOTE — Progress Notes (Signed)
  Subjective:    Patient ID: Katherine Best, female    DOB: August 29, 1933, 78 y.o.   MRN: [id number]  HPI Katherine Best is here today for her bilateral leg and foot pain.  Patient has been doing physical therapy as well as her in the insoles that we gave her. Patient states that the physical therapy has given her more strength but she does not notice that this is helped the aching or pain she has with activity. Patient still states that she feels weak after walking for about 10 minutes. Patient does wear compression stockings usually but is not wearing them today. Patient continues to take the meloxicam. Patient was given nortriptyline but is not taking it because it did affect her concentration. Patient has been tried other modalities such as ice and heat but has not noticed any improvement. Patient does have a past medical history significant for back pain previously. Patient does state that it does feel better when she is in flexion and extension. Patient denies any bowel or bladder trouble any fevers or chills or any abnormal weight loss. Past Medical History: Significant for hypertension.  No diabetes.  Recent work up of shortness of breath did not reveal any cardiac or pulmonary abnormalities per patient report.    Review of Systems As stated above in history of present illness    Objective:   Physical Exam Blood pressure 129/78, pulse 69, height 5\' 2"  (1.575 m), weight 183 lb (83.008 kg). General: No apparent distress alert and oriented x3 Ext: 2+ DP pulses bilaterally, 1+ pitting edema to mid shin bilaterally, venous stasis changes bilaterally Right foot: pronation with flat longitudinal and transverse arches; sensation intact to light touch; 5/5 strength in dorsiflexion; 1 beat of ankle clonus; hallucis rigiditis  Left foot: pronation with flat longitudinal and transverse arches; sensation intact to light touch; 5/5 strength in dorsiflexion; 1 beat of ankle clonus; 4th and 5th toes starting to  develop hammer toes Back exam: Patient does state that with extension she does feel a little bit of sensation in her back. Patient has a negative straight leg test bilaterally. Patient is unable to do a [facility] test bilaterally. Reflexes as above. Patient does have brisk reflexes of the patella and Achilles bilaterally.     Assessment & Plan:  Lower extremity pain: Now more concerning for claudication: vascular vs neurovascular etiology given history concerning for claudication.  Patient does have significant risk factors including high blood pressure, hypercholesterolemia, obesity and coronary artery disease. At this point we will get a ABI as well as lumbar spine films. Likely if we see any degenerative changes on x-ray we will get an MRI of the lumbar spine 2. Patient has been given Neurontin to try at a very low dose at night. Patient will titrate up as tolerated. We will call patient when we get the results. At that point we'll discuss further management and evaluation.   Foot pain: Improved somewhat with the pain that we gave her a previous visit. Patient will continue to wear these but likely this is referred pain secondary to the claudication. We'll continue to monitor.

## 2012-01-24 NOTE — Patient Instructions (Addendum)
It is good to see you, Iam sorry you're pain is not better Continue to go to physical therapy I'm glad this is helping. I am going to get x-rays of your back. I'm also going to send you for to other tests. One will be looking at the blood flow in your legs which is done with blood pressure cuffs. The other one likely will be an MRI of her your lumbar spine after I get your x-rays back. I will try to call you with the results and discuss we should do further. Lets try Neurontin at night. Take one pill nightly for the next 5 days then go to 2 pills nightly thereafter. After 2 weeks if you're still doing well you can go up to 3 pills   Your ABI Test is scheduled for Tuesday, November 5th at [facility]. You need to register in admitting at 8:30am. You can call and reschedule this appt if needed by calling [phone]

## 2012-01-25 ENCOUNTER — Telehealth: Payer: Self-pay | Admitting: Internal Medicine

## 2012-01-25 NOTE — Telephone Encounter (Signed)
Caller: Elysabeth/Patient; Patient Name: Katherine Best; PCP: [name] (Adults only); Best Callback Phone Number: [phone] Calling regarding left breast pain that started 01/24/12, pain is constant and hurts to touch, no redness reported. Does not feel lumps, afebrile. Has not taken anything for pain. Emergent signs and symptoms ruled out as per Breast Symptoms protocol except for see in 72 hours due to breast tender. Appt scheduled for 01/26/12 at 1:30 Dr. Felicity Coyer. States she normally takes hydrocodone for pain, advised to try Tylenol q 4 prn. Call back parameters given.

## 2012-01-26 ENCOUNTER — Ambulatory Visit: Payer: Medicare Other | Admitting: Internal Medicine

## 2012-01-30 ENCOUNTER — Ambulatory Visit (HOSPITAL_COMMUNITY)
Admission: RE | Admit: 2012-01-30 | Discharge: 2012-01-30 | Disposition: A | Discharge status: Home / Self Care | Payer: Medicare Other | Source: Ambulatory Visit | Attending: Sports Medicine | Admitting: Sports Medicine

## 2012-01-30 DIAGNOSIS — M545 Low back pain, unspecified: Secondary | ICD-10-CM | POA: Insufficient documentation

## 2012-01-30 DIAGNOSIS — I10 Essential (primary) hypertension: Secondary | ICD-10-CM | POA: Insufficient documentation

## 2012-01-30 DIAGNOSIS — M79609 Pain in unspecified limb: Secondary | ICD-10-CM | POA: Insufficient documentation

## 2012-01-30 NOTE — Progress Notes (Signed)
VASCULAR LAB PRELIMINARY  ARTERIAL  ABI completed:    RIGHT    LEFT    PRESSURE WAVEFORM  PRESSURE WAVEFORM  BRACHIAL 151 T BRACHIAL 149 T  DP   DP    AT 169 T AT 162 T  PT 175 T PT 176 T  PER   PER    GREAT TOE  NA GREAT TOE  NA    RIGHT LEFT  ABI >1.0 >1.0     Katherine Best, 01/30/2012, 9:25 AM

## 2012-02-05 ENCOUNTER — Telehealth: Payer: Self-pay | Admitting: *Deleted

## 2012-02-05 NOTE — Telephone Encounter (Signed)
Spoke with pt- she states she was on gabapentin 300 mg qhs for 5 days without problems and leg pain was better.  When she increased to gabapentin 600 mg qhs she had severe itching and new aches and pains that she was not having before.  Per Dr. Darrick Penna advised her to stop gabapentin as she may be allergic to it.  Scheduled her for a f/u with Dr. Katrinka Blazing 02/13/12.   Also gave her ABI and lumbar spine x-ray results.

## 2012-02-05 NOTE — Telephone Encounter (Signed)
Message copied by Mora Bellman on Mon Feb 05, 2012 11:27 AM ------      Message from: Claiborne Billings      Created: Mon Feb 05, 2012  8:45 AM      Regarding: Test Results and Question about medication      Contact: [phone]       Would like test results and having problems with the gabapentin.

## 2012-02-08 ENCOUNTER — Telehealth: Payer: Self-pay | Admitting: *Deleted

## 2012-02-08 ENCOUNTER — Telehealth: Payer: Self-pay | Admitting: Internal Medicine

## 2012-02-08 ENCOUNTER — Ambulatory Visit (INDEPENDENT_AMBULATORY_CARE_PROVIDER_SITE_OTHER): Payer: Medicare Other | Admitting: Internal Medicine

## 2012-02-08 ENCOUNTER — Encounter: Payer: Self-pay | Admitting: Internal Medicine

## 2012-02-08 VITALS — BP 136/78 | HR 67 | Temp 97.6°F | Ht 62.0 in | Wt 178.0 lb

## 2012-02-08 DIAGNOSIS — L299 Pruritus, unspecified: Secondary | ICD-10-CM

## 2012-02-08 MED ORDER — CETIRIZINE HCL 10 MG PO TABS: ORAL_TABLET | ORAL | Status: DC

## 2012-02-08 NOTE — Telephone Encounter (Signed)
Patient Information:  Caller Name: Randa Evens  Phone: [phone]  Patient: Katherine Best, Katherine Best  Gender: Female  DOB: 1933/11/01  Age: 78 Years  PCP: [name] (Adults only)   Symptoms  Reason For Call & Symptoms: generalized itching  Reviewed Health History In EMR: Yes  Reviewed Medications In EMR: Yes  Reviewed Allergies In EMR: Yes  Date of Onset of Symptoms: 02/04/2012  Treatments Tried: stopped gabapentin per orders, loratidine  Treatments Tried Worked: No  Guideline(s) Used:  Itching - Widespread  Disposition Per Guideline:   See Today in Office  Reason For Disposition Reached:   Severe widespread itching (i.e., interferes with sleep, normal activities or school) and not improved after 24 hours of itching Care Advice  Advice Given:  N/A  Office Follow Up:  Does the office need to follow up with this patient?: No  Instructions For The Office: N/A

## 2012-02-08 NOTE — Telephone Encounter (Signed)
Message copied by Mora Bellman on Thu Feb 08, 2012 10:53 AM ------      Message from: CERESI, Shawna Orleans L      Created: Thu Feb 08, 2012  8:20 AM      Regarding: phone message      Contact: [phone]       Pt states she stopped taking gabapentin and is still itching nonstop

## 2012-02-08 NOTE — Telephone Encounter (Signed)
Spoke with pt- she states she is still itching significantly- worse at night.  Intermittent during the day.  She took gabapentin for 6 days before stopping the med x 4 days ago.  Advised pt to check with PCP Dr. Arthur Holms, as the itching may not be related to gabapentin as she has been off of it for several days.  Pt agreeable to calling Dr. Arthur Holms.

## 2012-02-08 NOTE — Patient Instructions (Signed)
Pruritus  Pruritis is an itch. There are many different problems that can cause an itch. Dry skin is one of the most common causes of itching. Most cases of itching do not require medical attention.  HOME CARE INSTRUCTIONS  Make sure your skin is moistened on a regular basis. A moisturizer that contains petroleum jelly is best for keeping moisture in your skin. If you develop a rash, you may try the following for relief:   Use corticosteroid cream.  Apply cool compresses to the affected areas.  Bathe with Epsom salts or baking soda in the bathwater.  Soak in colloidal oatmeal baths. These are available at your pharmacy.  Apply baking soda paste to the rash. Stir water into baking soda until it reaches a paste-like consistency.  Use an anti-itch lotion.  Take over-the-counter diphenhydramine medicine by mouth as the instructions direct.  Avoid scratching. Scratching may cause the rash to become infected. If itching is very bad, your caregiver may suggest prescription lotions or creams to lessen your symptoms.  Avoid hot showers, which can make itching worse. A cold shower may help with itching as long as you use a moisturizer after the shower. SEEK MEDICAL CARE IF: The itching does not go away after several days. Document Released: 11/23/2010 Document Revised: 06/05/2011 Document Reviewed: 11/23/2010 Exit[employer] Patient Information 2013 [employer].  

## 2012-02-08 NOTE — Progress Notes (Signed)
Subjective:    Patient ID: Katherine Best, female    DOB: 01/01/1934, 78 y.o.   MRN: [id number]  HPI  Pt presents to the clinic today with c/o of pruritis. This is an ongoing problem. She is being followed by Dr. Debby Bud, who has tried loratadine, ranitidine, xanax and oatmeal baths, none of which is giving her any relief. She has not had a rash, just generalized itching. After talking with Dr. Katrinka Blazing, he thinks this may be cuasing her itching. She stopped it 4 days ago, but the pruritus still continues.  Review of Systems   Past Medical History  Diagnosis Date  . Unspecified venous (peripheral) insufficiency   . Tarsal tunnel syndrome   . Abdominal pain, right lower quadrant   . Other specified gastritis without mention of hemorrhage   . Obesity, unspecified   . Liver function study, abnormal   . Menopausal syndrome   . Diverticulosis   . External hemorrhoid   . Anxiety   . Interstitial cystitis   . Bursitis   . Hypothyroid   . IBS (irritable bowel syndrome)   . Morton's neuroma   . Hypertension   . Hyperlipidemia   . Colon polyp   . Dyspnea     a. 09/2011 CTA Chest: No PE, Ca2+ cors;  b. 09/2011 R&L heart Cath: Relatively nl R heart pressures, nl co/ci, nonobs cad, nl EF.    Current Outpatient Prescriptions  Medication Sig Dispense Refill  . ALPRAZolam (XANAX) 0.25 MG tablet Take 1 tablet (0.25 mg total) by mouth as needed.  30 tablet  2  . aspirin 81 MG tablet Take 81 mg by mouth daily.        . benzonatate (TESSALON) 100 MG capsule Take 100 mg by mouth 3 (three) times daily as needed.        . calcium carbonate (TUMS) 500 MG chewable tablet Chew 1 tablet by mouth 2 (two) times daily.        . ciprofloxacin (CIPRO) 100 MG tablet Take 125 mg by mouth daily.       Marland Kitchen estradiol (ESTRACE) 1 MG tablet Take 1 tablet (1 mg total) by mouth daily.  90 tablet  3  . fesoterodine (TOVIAZ) 4 MG TB24 Take 4 mg by mouth daily.        . fluticasone (FLONASE) 50 MCG/ACT nasal spray Place  2 sprays into the nose 2 (two) times daily as needed.        . gabapentin (NEURONTIN) 100 MG capsule Take one pill at night for the next 5 days. Didn't take 2 pills at night for 5 days. Then take 3 pills nightly thereafter.  90 capsule  3  . HYDROcodone-acetaminophen (NORCO) 5-325 MG per tablet Take 1 tablet by mouth as needed. For back pain       . hyoscyamine (LEVBID) 0.375 MG 12 hr tablet Take 0.375 mg by mouth every 12 (twelve) hours as needed.        Marland Kitchen levothyroxine (LEVOXYL) 25 MCG tablet Take 1 tablet (25 mcg total) by mouth daily.  90 tablet  3  . lidocaine (XYLOCAINE JELLY) 2 % jelly Apply to rectal area daily prn  30 mL  0  . loperamide (IMODIUM A-D) 2 MG tablet Take 2 mg by mouth as needed.        . loratadine (LORADAMED) 10 MG tablet Take 10 mg by mouth daily.        Marland Kitchen losartan-hydrochlorothiazide (HYZAAR) 50-12.5 MG per tablet Take 1 tablet  by mouth daily.  90 tablet  3  . meloxicam (MOBIC) 15 MG tablet Take 15 mg by mouth daily.      . metoprolol (LOPRESSOR) 50 MG tablet Take 1 tablet (50 mg total) by mouth 2 (two) times daily.  180 tablet  3  . MULTIPLE VITAMIN PO Take by mouth daily.        Marland Kitchen omeprazole (PRILOSEC) 20 MG capsule Take 1 capsule (20 mg total) by mouth daily.  90 capsule  3  . potassium chloride SA (K-DUR,KLOR-CON) 20 MEQ tablet Take 1 tablet (20 mEq total) by mouth daily.  90 tablet  3  . pravastatin (PRAVACHOL) 20 MG tablet Take 1 tablet (20 mg total) by mouth every evening.  90 tablet  3  . promethazine-codeine (PHENERGAN WITH CODEINE) 6.25-10 MG/5ML syrup Take 5 mLs by mouth every 4 (four) hours as needed.        [name] (TUCKS EX) Apply topically as directed.        Allergies  Allergen Reactions  . Sulfonamide Derivatives     REACTION: Nausea    Family History  Problem Relation Age of Onset  . Cancer Mother     Breast  . Hypertension Father   . Colon cancer      History   Social History  . Marital Status: Married    Spouse Name: N/A     Number of Children: N/A  . Years of Education: N/A   Occupational History  . Not on file.   Social History Main Topics  . Smoking status: Former Smoker -- 1.0 packs/day for 35 years    Types: Cigarettes    Quit date: 03/27/2002  . Smokeless tobacco: Never Used  . Alcohol Use: No  . Drug Use: No  . Sexually Active: Not on file   Other Topics Concern  . Not on file   Social History Narrative   HSGMarried '573 sons- '60, '61, '64, grandchildren 8 (7 girls, 1 boy)Lives independently with husbandPatient is a former smokerAlcohol use- yes socially not in yearsIllicit Drug use- noDrapery Neurosurgeon (still looking for work)     Constitutional: Denies fever, malaise, fatigue, headache or abrupt weight changes.  Skin: Pt reports generalized pruritus. Denies redness, rashes, lesions or ulcercations.   No other specific complaints in a complete review of systems (except as listed in HPI above).  Objective:   Physical Exam There were no vitals taken for this visit. Wt Readings from Last 3 Encounters:  01/24/12 183 lb (83.008 kg)  12/26/11 183 lb (83.008 kg)  12/20/11 180 lb (81.647 kg)    General: Appears her stated age, well developed, well nourished in NAD. Skin: Warm, dry and intact. No rashes, lesions or ulcerations noted.t.  Cardiovascular: Normal rate and rhythm. S1,S2 noted.  No murmur, rubs or gallops noted. No JVD or BLE edema. No carotid bruits noted. Pulmonary/Chest: Normal effort and positive vesicular breath sounds. No respiratory distress. No wheezes, rales or ronchi noted.       Assessment & Plan:   Pruritis  Take warm baths, do not use hot water Try not to scrub the skin vigorously Use emollients to coat the skin after taking a bath or shower Try not to scratch Continue taking the Ranitidine as prescribed Stop taking Loratadine and try taking Zyrtec 10 mg BID  RTC if symptoms perisit

## 2012-02-13 ENCOUNTER — Ambulatory Visit (INDEPENDENT_AMBULATORY_CARE_PROVIDER_SITE_OTHER): Payer: Medicare Other | Admitting: Family Medicine

## 2012-02-13 VITALS — BP 120/70 | Ht 62.0 in | Wt 183.0 lb

## 2012-02-13 DIAGNOSIS — M549 Dorsalgia, unspecified: Secondary | ICD-10-CM

## 2012-02-13 DIAGNOSIS — M545 Low back pain, unspecified: Secondary | ICD-10-CM

## 2012-02-13 NOTE — Assessment & Plan Note (Signed)
The patient's does not have vascular claudication in with new findings on x-ray there is consideration for neuro claudication secondary to stenosis. Patient does not have any focal findings and I would doubt the patient would develop spinal stenosis within 3 years since her last MRI but I do feel that further imaging is necessary at this time with patient not make any improvement in continuing to worsen. We will have patient get the MRI which will be good information and then she will followup with [facility] orthopedics for she can have another steroid injection that will be a little more specific than what she has previously had. Patient will do this and then come back and see [place] again in 2-3 weeks after injection to make sure she is improving. Encourage patient to continue to do the home exercise program including stretching. No medications were given to her today.

## 2012-02-13 NOTE — Patient Instructions (Addendum)
Very good to see you again. I'm sorry we have not be doing better at this point. I do think we should repeat the MRI of your lumbar spine. We'll put the order in today. MRI will be Thurs, November 21st at 4pm. Arrive at 3:45pm in the Admitting department of [place] for registration. [phone] I do think this information from the MRI will be good information that you can give Dr. Ethelene Hal to help him guide his steroid injection.  I would getting another steroid injection and see if this would be beneficial as well. Please come back and see me again in 2-3 weeks after your injection to make sure you're doing well.

## 2012-02-13 NOTE — Progress Notes (Signed)
Patient is here for followup of her bilateral foot and leg pain.patient was having findings that appeared to be mostly secondary to claudication. Patient did have an ABI done which was unremarkable. Patient did have a lumbar x-ray that did show some degenerative changes involving the facets of the L4-L5 and L5-S1 areas. Patient also has a posterior osteophyte at the L4-L5 region. This was reviewed today.patient states that since we've seen her and has started physical therapy she has not made any significant improvement.patient actually states that the gabapentin give her rashes she discontinued this as well. Patient feels that the compression stockings has not seemed to make a big difference either. Patient feels that day by day she seems to be worsening instead of improving which makes her concerned. Patient still says that the pain seems to occur with sitting for long amount time or as well as standing or doing activity for greater than 15 minutes. Patient states it goes on the posterior aspect of both legs and into her feet.  Patient did have an MRI back in 2010 of the lumbar spine that showed some spondylosis as well as diffuse disc pathology without stenosis.Patient has seen [facility] orthopedics previously and did have epidural steroid injections which were helping until the last injection approximately 4-6 months ago which did not help.    Physical Exam  Blood pressure 120/70, height 5\' 2"  (1.575 m), weight 183 lb (83.008 kg). General: No apparent distress alert and oriented x3 mood and affect normal Ext: 2+ DP pulses bilaterally, 1+ pitting edema to mid shin bilaterally, venous stasis changes bilaterally  Right foot: pronation with flat longitudinal and transverse arches; sensation intact to light touch; 5/5 strength in dorsiflexion; 1 beat of ankle clonus; hallucis rigiditis  Back exam: Patient has negative straight leg test negative Faber test bilaterally.

## 2012-02-15 ENCOUNTER — Ambulatory Visit (HOSPITAL_COMMUNITY)
Admission: RE | Admit: 2012-02-15 | Discharge: 2012-02-15 | Disposition: A | Discharge status: Home / Self Care | Payer: Medicare Other | Source: Ambulatory Visit | Attending: Sports Medicine | Admitting: Sports Medicine

## 2012-02-15 DIAGNOSIS — Q762 Congenital spondylolisthesis: Secondary | ICD-10-CM | POA: Insufficient documentation

## 2012-02-15 DIAGNOSIS — M545 Low back pain, unspecified: Secondary | ICD-10-CM | POA: Insufficient documentation

## 2012-02-15 DIAGNOSIS — R5383 Other fatigue: Secondary | ICD-10-CM | POA: Insufficient documentation

## 2012-02-15 DIAGNOSIS — R5381 Other malaise: Secondary | ICD-10-CM | POA: Insufficient documentation

## 2012-02-15 DIAGNOSIS — M79609 Pain in unspecified limb: Secondary | ICD-10-CM | POA: Insufficient documentation

## 2012-02-16 ENCOUNTER — Telehealth: Payer: Self-pay | Admitting: Family Medicine

## 2012-02-16 NOTE — Telephone Encounter (Signed)
Called patient and gave her MRI results. I think at this point it would be a good idea for her to see Dr. Ethelene Hal Patient is going to have an epidural injection likely but then we'll need to consider potential surgical intervention with the changes seen if this continues to be a problem.

## 2012-02-19 ENCOUNTER — Ambulatory Visit (INDEPENDENT_AMBULATORY_CARE_PROVIDER_SITE_OTHER): Payer: Medicare Other | Admitting: Internal Medicine

## 2012-02-19 ENCOUNTER — Encounter: Payer: Self-pay | Admitting: Internal Medicine

## 2012-02-19 VITALS — BP 118/58 | HR 75 | Temp 97.1°F | Resp 12 | Wt 181.1 lb

## 2012-02-19 DIAGNOSIS — L299 Pruritus, unspecified: Secondary | ICD-10-CM

## 2012-02-19 MED ORDER — MIRTAZAPINE 15 MG PO TABS: 15.0000 mg | ORAL_TABLET | ORAL | Status: DC

## 2012-02-19 NOTE — Patient Instructions (Addendum)
Katherine Best is a pleasant 78 yo lady who presents with generalized itching of 3 weeks duration.   Generalized itching (pruritis) - normal most recent labs (Sept '13), including thyroid, liver, renal, hematologic panels. Likely not medication related given timing of pruritis and persistence in spite of stopping gabapentin.  1. Continue 2 antihistamines: Ranitidine and Zyrtec  2. Start Mirtazapine 15 mg daily at night to help with sleep and itching.   **Prescription sent to [facility]  **Can increase to 30 mg (2 pills) after 1-2 nights  3. May consider adding Sertraline if itching not well controlled  **Let us know if the itching continues so we can consider starting Sertraline

## 2012-02-19 NOTE — Progress Notes (Signed)
Patient ID: Katherine Best, female   DOB: [birth year], 78 y.o.   MRN: [id number]  HPI Katherine Best is a 78 yo lady who presents with itching. The itching is generalized ("all over") including arms, shoulders, legs, torso and began 3 weeks ago. It is constant, unless she takes a Xanax at night to sleep. Before all this started, she had started gabapentin for foot numbness for 5 days and experienced no side effects, but when the gabapentin dose was doubled, the itching began the following day. She stopped the gabapentin two days after the itching started (3 weeks ago), yet the itching has persisted. Nothing besides Xanax to sleep has made it better. She has tried Benadryl, Claritin, anti-histamines and itching lotion and none seem to help much. Showering does not make it worse or better. She denies a rash. She has not been out of town. No new sick contacts. She showers every day. No new beauty or skin care products. She uses Dial soap. She had a shingles vaccine 1 year ago. She has not started any new medications except for the gabapentin which was stopped nearly 3 weeks ago.   ROS -Denies fever, chest pain or pressure, palpitations, shortness of breath, weight changes +Occasional chills +Fatigue (not sleeping well) +Walking difficulty (back and feet problems)   Past Medical History  Diagnosis Date  . Unspecified venous (peripheral) insufficiency   . Tarsal tunnel syndrome   . Abdominal pain, right lower quadrant   . Other specified gastritis without mention of hemorrhage   . Obesity, unspecified   . Liver function study, abnormal   . Menopausal syndrome   . Diverticulosis   . External hemorrhoid   . Anxiety   . Interstitial cystitis   . Bursitis   . Hypothyroid   . IBS (irritable bowel syndrome)   . Morton's neuroma   . Hypertension   . Hyperlipidemia   . Colon polyp   . Dyspnea     a. 09/2011 CTA Chest: No PE, Ca2+ cors;  b. 09/2011 R&L heart Cath: Relatively nl R heart pressures, nl  co/ci, nonobs cad, nl EF.   Past Surgical History  Procedure Date  . Abdominal hysterectomy     with BSO  . Tonsillectomy   . Foot surgery   . Oophorectomy   . Left tarsal tunnel release     sept '11 (Dr Lestine Box)  . Colonoscopy     multiple  . Hammer toe surgery     bilateral  . Ankle surgery     bilateral   Family History  Problem Relation Age of Onset  . Cancer Mother     Breast  . Hypertension Father   . Colon cancer     History   Social History  . Marital Status: Married    Spouse Name: N/A    Number of Children: N/A  . Years of Education: N/A   Occupational History  . Not on file.   Social History Main Topics  . Smoking status: Former Smoker -- 1.0 packs/day for 35 years    Types: Cigarettes    Quit date: 03/27/2002  . Smokeless tobacco: Never Used  . Alcohol Use: No  . Drug Use: No  . Sexually Active: Not on file   Other Topics Concern  . Not on file   Social History Narrative   HSGMarried '573 sons- '60, '61, '64, grandchildren 8 (7 girls, 1 boy)Lives independently with husbandPatient is a former smokerAlcohol use- yes socially not in yearsIllicit Drug use-  noDrapery Neurosurgeon (still looking for work)   Current Outpatient Prescriptions on File Prior to Visit  Medication Sig Dispense Refill  . ALPRAZolam (XANAX) 0.25 MG tablet Take 1 tablet (0.25 mg total) by mouth as needed.  30 tablet  2  . aspirin 81 MG tablet Take 81 mg by mouth daily.        . benzonatate (TESSALON) 100 MG capsule Take 100 mg by mouth 3 (three) times daily as needed.        . calcium carbonate (TUMS) 500 MG chewable tablet Chew 1 tablet by mouth 2 (two) times daily.        . cetirizine (ZYRTEC) 10 MG tablet Take 10 mg (1 tablet) by mouth 2 times a day  60 tablet  11  . ciprofloxacin (CIPRO) 100 MG tablet Take 125 mg by mouth daily.       . diphenhydrAMINE (BENADRYL) 25 MG tablet Take 25 mg by mouth every 6 (six) hours as needed.      Marland Kitchen estradiol (ESTRACE) 1 MG tablet Take 1  tablet (1 mg total) by mouth daily.  90 tablet  3  . fluticasone (FLONASE) 50 MCG/ACT nasal spray Place 2 sprays into the nose 2 (two) times daily as needed.        Marland Kitchen HYDROcodone-acetaminophen (NORCO) 5-325 MG per tablet Take 1 tablet by mouth as needed. For back pain       . hyoscyamine (LEVBID) 0.375 MG 12 hr tablet Take 0.375 mg by mouth every 12 (twelve) hours as needed.        Marland Kitchen levothyroxine (LEVOXYL) 25 MCG tablet Take 1 tablet (25 mcg total) by mouth daily.  90 tablet  3  . lidocaine (XYLOCAINE JELLY) 2 % jelly Apply to rectal area daily prn  30 mL  0  . loperamide (IMODIUM A-D) 2 MG tablet Take 2 mg by mouth as needed.        . loratadine (LORADAMED) 10 MG tablet Take 10 mg by mouth daily.        Marland Kitchen losartan-hydrochlorothiazide (HYZAAR) 50-12.5 MG per tablet Take 1 tablet by mouth daily.  90 tablet  3  . meloxicam (MOBIC) 15 MG tablet Take 15 mg by mouth daily.      . metoprolol (LOPRESSOR) 50 MG tablet Take 1 tablet (50 mg total) by mouth 2 (two) times daily.  180 tablet  3  . MULTIPLE VITAMIN PO Take by mouth daily.        Marland Kitchen omeprazole (PRILOSEC) 20 MG capsule Take 1 capsule (20 mg total) by mouth daily.  90 capsule  3  . oxybutynin (DITROPAN-XL) 10 MG 24 hr tablet Take 10 mg by mouth daily.      . potassium chloride SA (K-DUR,KLOR-CON) 20 MEQ tablet Take 1 tablet (20 mEq total) by mouth daily.  90 tablet  3  . pravastatin (PRAVACHOL) 20 MG tablet Take 1 tablet (20 mg total) by mouth every evening.  90 tablet  3  . promethazine-codeine (PHENERGAN WITH CODEINE) 6.25-10 MG/5ML syrup Take 5 mLs by mouth every 4 (four) hours as needed.        [name] (TUCKS EX) Apply topically as directed.        PE General: Pleasant overweight lady in moderate distress because of itching HEENT: Scalp itchiness CV: Regular rate and rhythm, S1 and S2 present. Pulse regular. Pulm: Lungs clear to auscultation bilaterally Extremities: No rashes appreciated. Mild bilateral leg swelling. Abdomen:  Diffuse itchiness. No rashes appreciated. Neuro: Alert  and appropriate. No focal deficits.   A/P Katherine Best is a 78 yo lady who presents with generalized itching of many  weeks duration.   # Generalized itching (pruritis) - normal most recent labs (Sept '13), including thyroid, liver, renal, hematologic panels. Likely not medication related given timing of pruritis and persistence in spite of stopping gabapentin. -Continue 2 antihistamines: Ranitidine and Zyrtec -Start Mirtazapine 15 mg daily at night to help with sleep and itching. Can increase to 30 mg daily after 1-2 nights. -May consider adding Sertraline if itching not well controlled -May consider referral to allergist if persists  Patient interviewed and examined, chart reviewed, UpToDate consulted. I agree with the assessment and plan as documented by Ms. Dajahnae Vondra, MS III.  M.Norins, MD

## 2012-02-22 ENCOUNTER — Encounter: Payer: Self-pay | Admitting: Internal Medicine

## 2012-04-09 ENCOUNTER — Ambulatory Visit (INDEPENDENT_AMBULATORY_CARE_PROVIDER_SITE_OTHER): Payer: Medicare Other | Admitting: Family Medicine

## 2012-04-09 VITALS — BP 120/60 | Ht 62.0 in | Wt 176.0 lb

## 2012-04-09 DIAGNOSIS — M549 Dorsalgia, unspecified: Secondary | ICD-10-CM

## 2012-04-09 DIAGNOSIS — M5090 Cervical disc disorder, unspecified, unspecified cervical region: Secondary | ICD-10-CM | POA: Insufficient documentation

## 2012-04-09 NOTE — Assessment & Plan Note (Signed)
Patient cervical neck pain likely secondary to degenerative changes. We will not repeat any imaging at this time but instead start physical therapy for strengthening and range of motion exercises when possible. Patient is able to use TENS unit if this is helpful. We will have patient come back in 6 weeks to make sure that she continues to improve.

## 2012-04-09 NOTE — Assessment & Plan Note (Signed)
Likely associated with degenerative changes. Patient is not having any radiculopathy and no spinal stenosis seen on MRI that would be significant enough to give her her symptoms. Patient states that she has had some mild improvement since the time of her last visit. We'll continue to monitor closely. Patient would like to avoid any new medications which I agree with. Will followup on an as needed for low back pain and should continue seeing her orthopedic physician.

## 2012-04-09 NOTE — Progress Notes (Signed)
Patient is here for followup of her low back pain as well as her cervical neck pain. Patient was having claudication-like symptoms. Patient did have an ABI done which was unremarkable. Patient did have degenerative changes of the facets of L4-L5 and L5-S1 areas on x-ray. Patient did have an MRI done of her back which was reviewed today. Patient's MRI did show some multilevel discogenic degenerative changes but nothing specifically that would give her claudication-like spinal stenosis. Patient did have epidural injections which did relieve some of the pressure but did not help her with the numbness she feels in her feet. Overall though patient does think that she has improved over the course of time slowly. Patient feels that physical therapy for her balance and exercising has helped. Patient is walking with much more confidence. Patient to stop taking anti-inflammatories in any pain medication at this time. Patient still states that after ambulating for long amount of time such as at [facility] she has significant fatigue. Patient is following up with her cardiologist next week. Patient denies any chest pain or shortness of breath.  Regarding her neck pain: Patient has had this for some time and states that recently she's been having more trouble with her stating her neck. Patient is not driving anymore secondary to this as well as speeding tickets. Patient has not had any other modalities done to this neck previously. Patient did have x-rays done in 2010 that did show cervical spondylosis worse at C5-6 and C6-7. No radiation into the arms no numbness or tingling.    Physical Exam  Blood pressure 120/60, height 5\' 2"  (1.575 m), weight 176 lb (79.833 kg). General: No apparent distress alert and oriented x3 mood and affect normal Ext: 2+ DP pulses bilaterally, 1+ pitting edema to mid shin bilaterally, venous stasis changes bilaterally but seems to be stable from last visit. Patient's deep tendon reflexes are 2+  and symmetric. Back exam: Patient has negative straight leg test negative Faber test bilaterally. Vision is minimally tender to palpation of the paraspinal musculature of the lumbar spine bilaterally. Neck: Negative spurling's Restricted neck range of motion mostly in rotation and sidebending bilaterally Grip strength and sensation normal in bilateral hands Strength good C4 to T1 distribution No sensory change to C4 to T1 Reflexes normal

## 2012-04-09 NOTE — Patient Instructions (Signed)
It is very good to see you. I would like you to start physical therapy again for your neck and upper back this time. I think this may help with range of motion and relieve some of the pressure. Regarding her lower back I think we will continue to monitor it but we do not want to add any other medications. Continue to do the exercises that do not seem to exacerbate it. Come back and see me again in 6 weeks to make sure that nothing seems to be worsening.

## 2012-04-12 ENCOUNTER — Ambulatory Visit (INDEPENDENT_AMBULATORY_CARE_PROVIDER_SITE_OTHER): Payer: Medicare Other | Admitting: Emergency Medicine

## 2012-04-12 ENCOUNTER — Encounter: Payer: Self-pay | Admitting: Emergency Medicine

## 2012-04-12 VITALS — BP 100/62 | HR 76 | Temp 97.3°F | Ht 62.0 in | Wt 181.2 lb

## 2012-04-12 DIAGNOSIS — R0609 Other forms of dyspnea: Secondary | ICD-10-CM

## 2012-04-12 DIAGNOSIS — R05 Cough: Secondary | ICD-10-CM

## 2012-04-12 NOTE — Assessment & Plan Note (Signed)
Due to restriction and deconditioning.

## 2012-04-12 NOTE — Patient Instructions (Addendum)
Please continue your omeprazole Continue to exercise as you are able Follow with Dr Delton Coombes as needed.

## 2012-04-12 NOTE — Assessment & Plan Note (Signed)
Improved although sometimes still bothers her. Omeprazole seems to help, will continue this. Push exercise. Follow prn

## 2012-04-12 NOTE — Progress Notes (Signed)
Subjective:    Patient ID: Katherine Best, female    DOB: Aug 04, 1933, 79 y.o.   MRN: [id number]  HPI 79 yo former smoker (35 pk-yrs), hx of HTN, hyperlipidemia. She is referred by Dr Debby Bud for cough that started about 2 months ago. She was well, has no real cough prior to this episode. The cough has been dry - never productive. No CP. She has a lot of indigestion, a lot of burping. She was started on omeprazole 10/30, hasn't really noticed a difference in the cough. She tells me that the cough is worse with talking, will often happen after eating certain foods. She has a globus sensation, clears throat often. She has some sneezing, no real congestion. She is on loratadine, takes actifed prn. Has been rx with abx and also prednisone burst and taper along with codeine cough syrup and tessalon perles. Of note she had an ARB/HCTZ added about 2.5 months ago   ROV 03/10/11 -- Returns for her cough. It is much better since PPI was added. Still bothers her some but better. Non-productive. No PND. She had abd pain when she was taking nexium bid so changed to qam. PFt today with no evidence for AFL.   ROV 10/25/11 -- follow up for cough. Improved when started on PPI - nexium, now changing to omeprazole. Restriction on spirometry. Since last time she underwent R and L cath that were reassuring for fatigue, some exertional SOB.  Notes from Flavia Shipper at cardiology say that she had documented hypoxemia. She is 96% on RA here at rest. She describes an episode since last time when she ate pork, felt swelling in hands and "throat closing".  Repeat PFT confirm restriction, no evidence AFL, low DLCO that corrects to normal for [facility]. CT-PA 09/25/11 was without PE.   ROV 04/12/12 -- f/u for cough, restriction on spiro without ILD. No hypoxemia on walking last visit. She has been exercising, feels that her functional capacity has improved. No further aspiration incidents, no cough. She is still on omeprazole, wonders if she  could stop it.      Objective:   Physical Exam Filed Vitals:   04/12/12 1345  BP: 100/62  Pulse: 76  Temp: 97.3 F (36.3 C)   Gen: Pleasant, slightly overwt woman, in no distress,  normal affect  ENT: No lesions,  mouth clear,  oropharynx clear, no postnasal drip  Neck: No JVD, no TMG, no carotid bruits  Lungs: No use of accessory muscles, no dullness to percussion, clear without rales or rhonchi  Cardiovascular: RRR, heart sounds normal, no murmur or gallops, no peripheral edema  Musculoskeletal: No deformities, no cyanosis or clubbing  Neuro: alert, non focal  Skin: Warm, no lesions or rashes    CT scan of the chest 09/25/11 --  Comparison: 12/19/2004  Findings: No axillary or supraclavicular adenopathy. There is no  mediastinal or hilar adenopathy. Calcifications involving the LAD  coronary artery noted. No pericardial or pleural effusion. No  airspace consolidation or atelectasis. The pulmonary arteries  appear patent. No evidence for acute embolus.  No suspicious pulmonary parenchymal nodule or mass noted. The  tracheobronchial tree appears normal. Review of the visualized  osseous structures is significant for mild thoracic spondylosis.  Limited imaging through the upper abdomen shows a nodule within the  right adrenal gland measuring 1.4 cm and 15.8 HU. This is  unchanged from previous exam and likely represents a benign  adenoma.  IMPRESSION:  1. Negative for acute pulmonary embolus.  2. Prominent coronary artery calcifications.       Assessment & Plan:  Chronic cough Improved although sometimes still bothers her. Omeprazole seems to help, will continue this. Push exercise. Follow prn  Dyspnea on exertion Due to restriction and deconditioning.

## 2012-04-17 ENCOUNTER — Encounter: Payer: Self-pay | Admitting: Family Medicine

## 2012-05-09 ENCOUNTER — Ambulatory Visit: Payer: Medicare Other | Admitting: Internal Medicine

## 2012-05-24 ENCOUNTER — Telehealth: Payer: Self-pay | Admitting: Internal Medicine

## 2012-05-24 MED ORDER — MIRTAZAPINE 15 MG PO TABS: 15.0000 mg | ORAL_TABLET | ORAL | Status: DC

## 2012-05-24 NOTE — Telephone Encounter (Signed)
Caller: Katherine Best/Patient; Phone: [phone]; Reason for Call: Pt calling today 05/24/12 regarding needs refill on Mirtazapine 15 mg.  Instead of sending to [facility] she would like this sent to [facility] so she can get a 90 day supply.  Said information for [facility] is in her chart.  PLEASE CALL PT BACK AT [phone] TO LET HER KNOW THIS HAS BEEN DONE.  Thanks.

## 2012-05-24 NOTE — Telephone Encounter (Signed)
Called pt to let her know the rx was sent to [facility] for a 90 day supply.

## 2012-07-04 ENCOUNTER — Other Ambulatory Visit: Payer: Self-pay

## 2012-07-04 DIAGNOSIS — Z1231 Encounter for screening mammogram for malignant neoplasm of breast: Secondary | ICD-10-CM

## 2012-07-05 ENCOUNTER — Ambulatory Visit (INDEPENDENT_AMBULATORY_CARE_PROVIDER_SITE_OTHER): Payer: Medicare Other | Admitting: Internal Medicine

## 2012-07-05 VITALS — BP 125/53 | HR 86 | Ht 62.0 in | Wt 177.0 lb

## 2012-07-05 DIAGNOSIS — I10 Essential (primary) hypertension: Secondary | ICD-10-CM

## 2012-07-05 DIAGNOSIS — E039 Hypothyroidism, unspecified: Secondary | ICD-10-CM

## 2012-07-05 NOTE — Progress Notes (Signed)
HPI Patient is a 79 yo who I saw in clinic last summer  She had SOB  Mild diastolic dysfunction on echo CT with coronary calcifications.  Rec R and L heart cath  This showed mild nonobstructive CAD and normal filling pressures. Breathing pretty good.  No CP Biggest problem is feet numb  Legs don't  Move well because of back  Now fingers numb Allergies  Allergen Reactions  . Gabapentin Itching  . Sulfonamide Derivatives     REACTION: Nausea    Current Outpatient Prescriptions  Medication Sig Dispense Refill  . ALPRAZolam (XANAX) 0.25 MG tablet Take 1 tablet (0.25 mg total) by mouth as needed.  30 tablet  2  . aspirin 81 MG tablet Take 81 mg by mouth daily.        . benzonatate (TESSALON) 100 MG capsule Take 100 mg by mouth 3 (three) times daily as needed.        . calcium carbonate (TUMS) 500 MG chewable tablet Chew 1 tablet by mouth 2 (two) times daily.        . cetirizine (ZYRTEC) 10 MG tablet Take 10 mg (1 tablet) by mouth 2 times a day  60 tablet  11  . ciprofloxacin (CIPRO) 100 MG tablet Take 125 mg by mouth daily.       . diphenhydrAMINE (BENADRYL) 25 MG tablet Take 25 mg by mouth every 6 (six) hours as needed.      Marland Kitchen estradiol (ESTRACE) 1 MG tablet Take 1 tablet (1 mg total) by mouth daily.  90 tablet  3  . HYDROcodone-acetaminophen (NORCO) 5-325 MG per tablet Take 1 tablet by mouth as needed. For back pain       . hyoscyamine (LEVBID) 0.375 MG 12 hr tablet Take 0.375 mg by mouth every 12 (twelve) hours as needed.        Marland Kitchen levothyroxine (LEVOXYL) 25 MCG tablet Take 1 tablet (25 mcg total) by mouth daily.  90 tablet  3  . loperamide (IMODIUM A-D) 2 MG tablet Take 2 mg by mouth as needed.        Marland Kitchen losartan-hydrochlorothiazide (HYZAAR) 50-12.5 MG per tablet Take 1 tablet by mouth daily.  90 tablet  3  . meloxicam (MOBIC) 15 MG tablet Take 15 mg by mouth daily.      . metoprolol (LOPRESSOR) 50 MG tablet Take 1 tablet (50 mg total) by mouth 2 (two) times daily.  180 tablet  3  .  mirtazapine (REMERON) 15 MG tablet Take 1 tablet (15 mg total) by mouth as directed. :1 or 2 at bedtime for pruritis  180 tablet  1  . MULTIPLE VITAMIN PO Take by mouth daily.        Marland Kitchen omeprazole (PRILOSEC) 20 MG capsule Take 1 capsule (20 mg total) by mouth daily.  90 capsule  3  . oxybutynin (DITROPAN-XL) 10 MG 24 hr tablet Take 10 mg by mouth daily.      . potassium chloride SA (K-DUR,KLOR-CON) 20 MEQ tablet Take 1 tablet (20 mEq total) by mouth daily.  90 tablet  3  . pravastatin (PRAVACHOL) 20 MG tablet Take 1 tablet (20 mg total) by mouth every evening.  90 tablet  3  . promethazine-codeine (PHENERGAN WITH CODEINE) 6.25-10 MG/5ML syrup Take 5 mLs by mouth every 4 (four) hours as needed.        [name] (TUCKS EX) Apply topically as directed.      . fluticasone (FLONASE) 50 MCG/ACT nasal spray Place 2  sprays into the nose 2 (two) times daily as needed.         No current facility-administered medications for this visit.    Past Medical History  Diagnosis Date  . Unspecified venous (peripheral) insufficiency   . Tarsal tunnel syndrome   . Abdominal pain, right lower quadrant   . Other specified gastritis without mention of hemorrhage   . Obesity, unspecified   . Liver function study, abnormal   . Menopausal syndrome   . Diverticulosis   . External hemorrhoid   . Anxiety   . Interstitial cystitis   . Bursitis   . Hypothyroid   . IBS (irritable bowel syndrome)   . Morton's neuroma   . Hypertension   . Hyperlipidemia   . Colon polyp   . Dyspnea     a. 09/2011 CTA Chest: No PE, Ca2+ cors;  b. 09/2011 R&L heart Cath: Relatively nl R heart pressures, nl co/ci, nonobs cad, nl EF.    Past Surgical History  Procedure Laterality Date  . Abdominal hysterectomy      with BSO  . Tonsillectomy    . Foot surgery    . Oophorectomy    . Left tarsal tunnel release      sept '11 (Dr Lestine Box)  . Colonoscopy      multiple  . Hammer toe surgery      bilateral  . Ankle surgery       bilateral    Family History  Problem Relation Age of Onset  . Cancer Mother     Breast  . Hypertension Father   . Colon cancer      History   Social History  . Marital Status: Married    Spouse Name: N/A    Number of Children: N/A  . Years of Education: N/A   Occupational History  . Not on file.   Social History Main Topics  . Smoking status: Former Smoker -- 1.00 packs/day for 35 years    Types: Cigarettes    Quit date: 03/27/2002  . Smokeless tobacco: Never Used  . Alcohol Use: No  . Drug Use: No  . Sexually Active: Not on file   Other Topics Concern  . 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  They are negative to the above problem except as previously stated.  Vital Signs: BP 125/53  Pulse 86  Ht 5\' 2"  (1.575 m)  Wt 177 lb (80.287 kg)  BMI 32.37 kg/m2  Physical Exam Patinet isin NAD HEENT:  Normocephalic, atraumatic. EOMI, PERRLA.  Neck: JVP is normal.  No bruits.  Lungs: clear to auscultation. No rales no wheezes.  Heart: Regular rate and rhythm. Normal S1, S2. No S3.   No significant murmurs. PMI not displaced.  Abdomen:  Supple, nontender. Normal bowel sounds. No masses. No hepatomegaly.  Extremities:   Good distal pulses throughout. No lower extremity edema.  Musculoskeletal :moving all extremities.  Neuro:   alert and oriented x3.  CN II-XII grossly intact.  EKG  SR 86 bpm.  First degree AV block  Assessment and Plan:  1.  CAD  Patinet  denies CP  Keep onsame regimen  2.  Dyspnea  Patient denies.    3.  HL  Keep on statin.

## 2012-07-09 ENCOUNTER — Other Ambulatory Visit (INDEPENDENT_AMBULATORY_CARE_PROVIDER_SITE_OTHER): Payer: Medicare Other

## 2012-07-09 DIAGNOSIS — I10 Essential (primary) hypertension: Secondary | ICD-10-CM

## 2012-07-09 DIAGNOSIS — E039 Hypothyroidism, unspecified: Secondary | ICD-10-CM

## 2012-07-09 LAB — CBC WITH DIFFERENTIAL/PLATELET
Basophils Relative: 0.4 % (ref 0.0–3.0)
Eosinophils Absolute: 0.2 10*3/uL (ref 0.0–0.7)
Eosinophils Relative: 2.8 % (ref 0.0–5.0)
Lymphocytes Relative: 20.2 % (ref 12.0–46.0)
Neutrophils Relative %: 69.2 % (ref 43.0–77.0)
RBC: 4.12 Mil/uL (ref 3.87–5.11)
WBC: 7.8 10*3/uL (ref 4.5–10.5)

## 2012-07-09 LAB — LIPID PANEL: Triglycerides: 233 mg/dL — ABNORMAL HIGH (ref 0.0–149.0)

## 2012-07-09 LAB — COMPREHENSIVE METABOLIC PANEL
AST: 27 U/L (ref 0–37)
Albumin: 3.7 g/dL (ref 3.5–5.2)
BUN: 17 mg/dL (ref 6–23)
Calcium: 8.9 mg/dL (ref 8.4–10.5)
Chloride: 98 mEq/L (ref 96–112)
Glucose, Bld: 110 mg/dL — ABNORMAL HIGH (ref 70–99)
Potassium: 3.7 mEq/L (ref 3.5–5.1)
Sodium: 135 mEq/L (ref 135–145)
Total Protein: 6.8 g/dL (ref 6.0–8.3)

## 2012-07-09 LAB — TSH: TSH: 2.09 u[IU]/mL (ref 0.35–5.50)

## 2012-07-10 ENCOUNTER — Ambulatory Visit (INDEPENDENT_AMBULATORY_CARE_PROVIDER_SITE_OTHER): Payer: Medicare Other | Admitting: Internal Medicine

## 2012-07-10 ENCOUNTER — Encounter: Payer: Self-pay | Admitting: Internal Medicine

## 2012-07-10 ENCOUNTER — Other Ambulatory Visit (INDEPENDENT_AMBULATORY_CARE_PROVIDER_SITE_OTHER): Payer: Medicare Other

## 2012-07-10 VITALS — BP 120/68 | HR 76 | Temp 97.1°F | Wt 178.8 lb

## 2012-07-10 DIAGNOSIS — G609 Hereditary and idiopathic neuropathy, unspecified: Secondary | ICD-10-CM

## 2012-07-10 DIAGNOSIS — G629 Polyneuropathy, unspecified: Secondary | ICD-10-CM

## 2012-07-10 DIAGNOSIS — R739 Hyperglycemia, unspecified: Secondary | ICD-10-CM

## 2012-07-10 DIAGNOSIS — R7309 Other abnormal glucose: Secondary | ICD-10-CM

## 2012-07-10 DIAGNOSIS — E538 Deficiency of other specified B group vitamins: Secondary | ICD-10-CM

## 2012-07-10 DIAGNOSIS — E039 Hypothyroidism, unspecified: Secondary | ICD-10-CM

## 2012-07-10 MED ORDER — PREGABALIN 50 MG PO CAPS: 50.0000 mg | ORAL_CAPSULE | Freq: Three times a day (TID) | ORAL | Status: DC

## 2012-07-10 MED ORDER — LEVOTHYROXINE SODIUM 25 MCG PO TABS: 25.0000 ug | ORAL_TABLET | Freq: Every day | ORAL | Status: DC

## 2012-07-10 NOTE — Assessment & Plan Note (Signed)
Lab Results  Component Value Date   TSH 2.09 07/09/2012   The current medical regimen is effective;  continue present plan and medications.

## 2012-07-10 NOTE — Progress Notes (Signed)
  Subjective:    Patient ID: Katherine Best, female    DOB: [birth year], 79 y.o.   MRN: [id number]  HPI complains of numb fingerstip Onset approximately one month ago Denies history of same in hands, but chronic numbness ball of L foot affects R L hand symptoms not related to position or overuse Not associated with swelling, pain or discoloration  Past Medical History  Diagnosis Date  . Unspecified venous (peripheral) insufficiency   . Tarsal tunnel syndrome   . Abdominal pain, right lower quadrant   . Other specified gastritis without mention of hemorrhage   . Obesity, unspecified   . Liver function study, abnormal   . Menopausal syndrome   . Diverticulosis   . External hemorrhoid   . Anxiety   . Interstitial cystitis   . Bursitis   . Hypothyroid   . IBS (irritable bowel syndrome)   . Morton's neuroma   . Hypertension   . Hyperlipidemia   . Colon polyp   . Dyspnea     a. 09/2011 CTA Chest: No PE, Ca2+ cors;  b. 09/2011 R&L heart Cath: Relatively nl R heart pressures, nl co/ci, nonobs cad, nl EF.    Review of Systems  Constitutional: Negative for fever and fatigue.  Respiratory: Negative for cough and shortness of breath.   Cardiovascular: Negative for chest pain and palpitations.  Neurological: Positive for numbness. Negative for tremors, seizures and weakness.       Objective:   Physical Exam BP 120/68  Pulse 76  Temp(Src) 97.1 F (36.2 C) (Oral)  Wt 178 lb 12.8 oz (81.103 kg)  BMI 32.69 kg/m2  SpO2 96% Wt Readings from Last 3 Encounters:  07/10/12 178 lb 12.8 oz (81.103 kg)  07/05/12 177 lb (80.287 kg)  04/12/12 181 lb 3.2 oz (82.192 kg)   Constitutional: She appears well-developed and well-nourished. No distress.  Cardiovascular: Normal rate, regular rhythm and normal heart sounds.  No murmur heard. No BLE edema. Pulmonary/Chest: Effort normal and breath sounds normal. No respiratory distress. She has no wheezes. Musculoskeletal: Normal range of motion,  no joint effusions. No gross deformities Neurological: She is alert and oriented to person, place, and time. No cranial nerve deficit. Coordination, strength and grip are normal.  Skin: Skin is warm and dry. No rash noted. No erythema.  Psychiatric: She has a normal mood and affect. Her behavior is normal. Judgment and thought content normal.   Lab Results  Component Value Date   WBC 7.8 07/09/2012   HGB 12.5 07/09/2012   HCT 37.2 07/09/2012   PLT 344.0 07/09/2012   GLUCOSE 110* 07/09/2012   CHOL 143 07/09/2012   TRIG 233.0* 07/09/2012   HDL 36.30* 07/09/2012   LDLDIRECT 78.3 07/09/2012   LDLCALC DEL 05/02/2007   ALT 29 07/09/2012   AST 27 07/09/2012   NA 135 07/09/2012   K 3.7 07/09/2012   CL 98 07/09/2012   CREATININE 0.7 07/09/2012   BUN 17 07/09/2012   CO2 26 07/09/2012   TSH 2.09 07/09/2012   INR 1.0 09/27/2011   HGBA1C 6.6* 03/10/2008   Lab Results  Component Value Date   VITAMINB12 690 08/28/2011       Assessment & Plan:   Peripheral neuropathy - B hands/fingertips ?DM related - recheck a1c now given hyperglycemia on fasting labs yesterday and hx "borderline" DM in past normal B12 08/2011 Start Lyrica now -titrate as needed follow up as needed depending on symptoms

## 2012-07-10 NOTE — Patient Instructions (Signed)
It was good to see you today. We have reviewed your prior records including labs and tests today Test(s) ordered today. Your results will be released to MyChart (or called to you) after review, usually within 72hours after test completion. If any changes need to be made, you will be notified at that same time. Start Lyrica 3 times a day for nerve pain and numbness Followup as needed

## 2012-07-10 NOTE — Addendum Note (Signed)
Addended by: Deatra James on: 07/10/2012 04:43 PM   Modules accepted: Orders

## 2012-07-17 ENCOUNTER — Encounter: Payer: Self-pay | Admitting: Internal Medicine

## 2012-07-18 MED ORDER — ALPRAZOLAM 0.25 MG PO TABS: 0.2500 mg | ORAL_TABLET | Freq: Every evening | ORAL | Status: DC | PRN

## 2012-08-12 ENCOUNTER — Ambulatory Visit: Payer: Medicare Other

## 2012-08-13 ENCOUNTER — Ambulatory Visit
Admission: RE | Admit: 2012-08-13 | Discharge: 2012-08-13 | Disposition: A | Discharge status: Home / Self Care | Payer: Medicare Other | Source: Ambulatory Visit

## 2012-08-13 DIAGNOSIS — Z1231 Encounter for screening mammogram for malignant neoplasm of breast: Secondary | ICD-10-CM

## 2012-08-15 ENCOUNTER — Other Ambulatory Visit: Payer: Self-pay | Admitting: Internal Medicine

## 2012-08-15 DIAGNOSIS — R928 Other abnormal and inconclusive findings on diagnostic imaging of breast: Secondary | ICD-10-CM

## 2012-10-31 DIAGNOSIS — M47816 Spondylosis without myelopathy or radiculopathy, lumbar region: Secondary | ICD-10-CM | POA: Insufficient documentation

## 2012-12-26 ENCOUNTER — Encounter: Payer: Self-pay | Admitting: Sports Medicine

## 2013-01-10 DIAGNOSIS — H9201 Otalgia, right ear: Secondary | ICD-10-CM | POA: Insufficient documentation

## 2013-01-10 DIAGNOSIS — R42 Dizziness and giddiness: Secondary | ICD-10-CM | POA: Insufficient documentation

## 2013-01-30 ENCOUNTER — Other Ambulatory Visit: Payer: Self-pay

## 2013-03-06 ENCOUNTER — Encounter: Payer: Self-pay | Admitting: Internal Medicine

## 2013-04-10 DIAGNOSIS — M519 Unspecified thoracic, thoracolumbar and lumbosacral intervertebral disc disorder: Secondary | ICD-10-CM | POA: Diagnosis not present

## 2013-04-10 DIAGNOSIS — G609 Hereditary and idiopathic neuropathy, unspecified: Secondary | ICD-10-CM | POA: Diagnosis not present

## 2013-05-06 DIAGNOSIS — I2510 Atherosclerotic heart disease of native coronary artery without angina pectoris: Secondary | ICD-10-CM | POA: Diagnosis not present

## 2013-05-06 DIAGNOSIS — G2581 Restless legs syndrome: Secondary | ICD-10-CM | POA: Insufficient documentation

## 2013-05-06 DIAGNOSIS — E039 Hypothyroidism, unspecified: Secondary | ICD-10-CM | POA: Diagnosis not present

## 2013-05-06 DIAGNOSIS — I10 Essential (primary) hypertension: Secondary | ICD-10-CM | POA: Diagnosis not present

## 2013-05-07 DIAGNOSIS — I10 Essential (primary) hypertension: Secondary | ICD-10-CM | POA: Diagnosis not present

## 2013-05-07 DIAGNOSIS — E785 Hyperlipidemia, unspecified: Secondary | ICD-10-CM | POA: Diagnosis not present

## 2013-05-07 DIAGNOSIS — N3010 Interstitial cystitis (chronic) without hematuria: Secondary | ICD-10-CM | POA: Diagnosis not present

## 2013-05-08 DIAGNOSIS — M47817 Spondylosis without myelopathy or radiculopathy, lumbosacral region: Secondary | ICD-10-CM | POA: Diagnosis not present

## 2013-05-08 DIAGNOSIS — M5380 Other specified dorsopathies, site unspecified: Secondary | ICD-10-CM | POA: Diagnosis not present

## 2013-05-16 DIAGNOSIS — H903 Sensorineural hearing loss, bilateral: Secondary | ICD-10-CM | POA: Diagnosis not present

## 2013-07-04 DIAGNOSIS — IMO0002 Reserved for concepts with insufficient information to code with codable children: Secondary | ICD-10-CM | POA: Diagnosis not present

## 2013-08-04 DIAGNOSIS — I10 Essential (primary) hypertension: Secondary | ICD-10-CM | POA: Diagnosis not present

## 2013-08-04 DIAGNOSIS — E785 Hyperlipidemia, unspecified: Secondary | ICD-10-CM | POA: Diagnosis not present

## 2013-08-04 DIAGNOSIS — I2510 Atherosclerotic heart disease of native coronary artery without angina pectoris: Secondary | ICD-10-CM | POA: Diagnosis not present

## 2013-08-04 DIAGNOSIS — E039 Hypothyroidism, unspecified: Secondary | ICD-10-CM | POA: Diagnosis not present

## 2013-09-02 DIAGNOSIS — I2510 Atherosclerotic heart disease of native coronary artery without angina pectoris: Secondary | ICD-10-CM | POA: Diagnosis not present

## 2013-09-02 DIAGNOSIS — I10 Essential (primary) hypertension: Secondary | ICD-10-CM | POA: Diagnosis not present

## 2013-09-02 DIAGNOSIS — I509 Heart failure, unspecified: Secondary | ICD-10-CM | POA: Diagnosis not present

## 2013-09-02 DIAGNOSIS — E785 Hyperlipidemia, unspecified: Secondary | ICD-10-CM | POA: Diagnosis not present

## 2013-09-08 DIAGNOSIS — Z1231 Encounter for screening mammogram for malignant neoplasm of breast: Secondary | ICD-10-CM | POA: Diagnosis not present

## 2013-09-15 DIAGNOSIS — H905 Unspecified sensorineural hearing loss: Secondary | ICD-10-CM | POA: Diagnosis not present

## 2013-09-15 DIAGNOSIS — I2510 Atherosclerotic heart disease of native coronary artery without angina pectoris: Secondary | ICD-10-CM | POA: Diagnosis not present

## 2013-09-15 DIAGNOSIS — H9319 Tinnitus, unspecified ear: Secondary | ICD-10-CM | POA: Diagnosis not present

## 2013-09-15 DIAGNOSIS — H9190 Unspecified hearing loss, unspecified ear: Secondary | ICD-10-CM | POA: Diagnosis not present

## 2013-09-16 DIAGNOSIS — R928 Other abnormal and inconclusive findings on diagnostic imaging of breast: Secondary | ICD-10-CM | POA: Diagnosis not present

## 2013-09-16 DIAGNOSIS — N6009 Solitary cyst of unspecified breast: Secondary | ICD-10-CM | POA: Diagnosis not present

## 2013-09-23 DIAGNOSIS — I509 Heart failure, unspecified: Secondary | ICD-10-CM | POA: Diagnosis not present

## 2013-09-23 DIAGNOSIS — I517 Cardiomegaly: Secondary | ICD-10-CM | POA: Diagnosis not present

## 2013-09-23 DIAGNOSIS — I2510 Atherosclerotic heart disease of native coronary artery without angina pectoris: Secondary | ICD-10-CM | POA: Diagnosis not present

## 2013-10-07 DIAGNOSIS — E785 Hyperlipidemia, unspecified: Secondary | ICD-10-CM | POA: Diagnosis not present

## 2013-10-07 DIAGNOSIS — I10 Essential (primary) hypertension: Secondary | ICD-10-CM | POA: Diagnosis not present

## 2013-10-07 DIAGNOSIS — I2510 Atherosclerotic heart disease of native coronary artery without angina pectoris: Secondary | ICD-10-CM | POA: Diagnosis not present

## 2013-10-07 DIAGNOSIS — I5189 Other ill-defined heart diseases: Secondary | ICD-10-CM | POA: Insufficient documentation

## 2013-10-07 DIAGNOSIS — I519 Heart disease, unspecified: Secondary | ICD-10-CM | POA: Diagnosis not present

## 2013-10-10 DIAGNOSIS — M47817 Spondylosis without myelopathy or radiculopathy, lumbosacral region: Secondary | ICD-10-CM | POA: Diagnosis not present

## 2013-10-27 DIAGNOSIS — L600 Ingrowing nail: Secondary | ICD-10-CM | POA: Diagnosis not present

## 2013-10-27 DIAGNOSIS — B351 Tinea unguium: Secondary | ICD-10-CM | POA: Diagnosis not present

## 2013-10-27 DIAGNOSIS — M2010 Hallux valgus (acquired), unspecified foot: Secondary | ICD-10-CM | POA: Diagnosis not present

## 2013-10-27 DIAGNOSIS — L97509 Non-pressure chronic ulcer of other part of unspecified foot with unspecified severity: Secondary | ICD-10-CM | POA: Diagnosis not present

## 2013-10-27 DIAGNOSIS — M2040 Other hammer toe(s) (acquired), unspecified foot: Secondary | ICD-10-CM | POA: Diagnosis not present

## 2013-11-04 DIAGNOSIS — F341 Dysthymic disorder: Secondary | ICD-10-CM | POA: Diagnosis not present

## 2013-11-04 DIAGNOSIS — E038 Other specified hypothyroidism: Secondary | ICD-10-CM | POA: Diagnosis not present

## 2013-11-04 DIAGNOSIS — I2510 Atherosclerotic heart disease of native coronary artery without angina pectoris: Secondary | ICD-10-CM | POA: Diagnosis not present

## 2013-11-04 DIAGNOSIS — F418 Other specified anxiety disorders: Secondary | ICD-10-CM | POA: Insufficient documentation

## 2013-11-04 DIAGNOSIS — I10 Essential (primary) hypertension: Secondary | ICD-10-CM | POA: Diagnosis not present

## 2013-11-04 DIAGNOSIS — E785 Hyperlipidemia, unspecified: Secondary | ICD-10-CM | POA: Diagnosis not present

## 2013-11-10 DIAGNOSIS — L97509 Non-pressure chronic ulcer of other part of unspecified foot with unspecified severity: Secondary | ICD-10-CM | POA: Diagnosis not present

## 2013-11-10 DIAGNOSIS — M2040 Other hammer toe(s) (acquired), unspecified foot: Secondary | ICD-10-CM | POA: Diagnosis not present

## 2013-11-10 DIAGNOSIS — M2010 Hallux valgus (acquired), unspecified foot: Secondary | ICD-10-CM | POA: Diagnosis not present

## 2013-11-10 DIAGNOSIS — L84 Corns and callosities: Secondary | ICD-10-CM | POA: Diagnosis not present

## 2013-11-12 DIAGNOSIS — Z01818 Encounter for other preprocedural examination: Secondary | ICD-10-CM | POA: Diagnosis not present

## 2013-11-20 DIAGNOSIS — M2040 Other hammer toe(s) (acquired), unspecified foot: Secondary | ICD-10-CM | POA: Diagnosis not present

## 2013-11-20 DIAGNOSIS — Z9079 Acquired absence of other genital organ(s): Secondary | ICD-10-CM | POA: Diagnosis not present

## 2013-11-20 DIAGNOSIS — E039 Hypothyroidism, unspecified: Secondary | ICD-10-CM | POA: Diagnosis not present

## 2013-11-20 DIAGNOSIS — Z79899 Other long term (current) drug therapy: Secondary | ICD-10-CM | POA: Diagnosis not present

## 2013-11-20 DIAGNOSIS — Z87891 Personal history of nicotine dependence: Secondary | ICD-10-CM | POA: Diagnosis not present

## 2013-11-20 DIAGNOSIS — G2581 Restless legs syndrome: Secondary | ICD-10-CM | POA: Diagnosis not present

## 2013-11-20 DIAGNOSIS — E785 Hyperlipidemia, unspecified: Secondary | ICD-10-CM | POA: Diagnosis not present

## 2013-11-20 DIAGNOSIS — E669 Obesity, unspecified: Secondary | ICD-10-CM | POA: Diagnosis not present

## 2013-11-20 DIAGNOSIS — F341 Dysthymic disorder: Secondary | ICD-10-CM | POA: Diagnosis not present

## 2013-11-20 DIAGNOSIS — M2010 Hallux valgus (acquired), unspecified foot: Secondary | ICD-10-CM | POA: Diagnosis not present

## 2013-11-20 DIAGNOSIS — Z882 Allergy status to sulfonamides status: Secondary | ICD-10-CM | POA: Diagnosis not present

## 2013-11-20 DIAGNOSIS — I10 Essential (primary) hypertension: Secondary | ICD-10-CM | POA: Diagnosis not present

## 2013-11-25 DIAGNOSIS — M2010 Hallux valgus (acquired), unspecified foot: Secondary | ICD-10-CM | POA: Diagnosis not present

## 2013-12-02 DIAGNOSIS — M2040 Other hammer toe(s) (acquired), unspecified foot: Secondary | ICD-10-CM | POA: Diagnosis not present

## 2013-12-02 DIAGNOSIS — M2010 Hallux valgus (acquired), unspecified foot: Secondary | ICD-10-CM | POA: Diagnosis not present

## 2013-12-16 DIAGNOSIS — M2010 Hallux valgus (acquired), unspecified foot: Secondary | ICD-10-CM | POA: Diagnosis not present

## 2013-12-16 DIAGNOSIS — M2040 Other hammer toe(s) (acquired), unspecified foot: Secondary | ICD-10-CM | POA: Diagnosis not present

## 2013-12-16 DIAGNOSIS — M79609 Pain in unspecified limb: Secondary | ICD-10-CM | POA: Diagnosis not present

## 2014-01-07 DIAGNOSIS — L309 Dermatitis, unspecified: Secondary | ICD-10-CM | POA: Diagnosis not present

## 2014-01-08 ENCOUNTER — Encounter: Payer: Self-pay | Admitting: Internal Medicine

## 2014-01-14 DIAGNOSIS — Z23 Encounter for immunization: Secondary | ICD-10-CM | POA: Diagnosis not present

## 2014-01-19 DIAGNOSIS — M79675 Pain in left toe(s): Secondary | ICD-10-CM | POA: Diagnosis not present

## 2014-01-19 DIAGNOSIS — M2041 Other hammer toe(s) (acquired), right foot: Secondary | ICD-10-CM | POA: Diagnosis not present

## 2014-01-19 DIAGNOSIS — B351 Tinea unguium: Secondary | ICD-10-CM | POA: Diagnosis not present

## 2014-01-19 DIAGNOSIS — M79674 Pain in right toe(s): Secondary | ICD-10-CM | POA: Diagnosis not present

## 2014-01-19 DIAGNOSIS — M2042 Other hammer toe(s) (acquired), left foot: Secondary | ICD-10-CM | POA: Diagnosis not present

## 2014-01-21 DIAGNOSIS — L309 Dermatitis, unspecified: Secondary | ICD-10-CM | POA: Diagnosis not present

## 2014-02-02 DIAGNOSIS — Z01818 Encounter for other preprocedural examination: Secondary | ICD-10-CM | POA: Diagnosis not present

## 2014-02-04 DIAGNOSIS — L570 Actinic keratosis: Secondary | ICD-10-CM | POA: Diagnosis not present

## 2014-02-05 DIAGNOSIS — E039 Hypothyroidism, unspecified: Secondary | ICD-10-CM | POA: Diagnosis not present

## 2014-02-05 DIAGNOSIS — I10 Essential (primary) hypertension: Secondary | ICD-10-CM | POA: Diagnosis not present

## 2014-02-05 DIAGNOSIS — I2510 Atherosclerotic heart disease of native coronary artery without angina pectoris: Secondary | ICD-10-CM | POA: Diagnosis not present

## 2014-02-05 DIAGNOSIS — E780 Pure hypercholesterolemia: Secondary | ICD-10-CM | POA: Diagnosis not present

## 2014-02-12 DIAGNOSIS — M2042 Other hammer toe(s) (acquired), left foot: Secondary | ICD-10-CM | POA: Diagnosis not present

## 2014-02-12 DIAGNOSIS — M24575 Contracture, left foot: Secondary | ICD-10-CM | POA: Diagnosis not present

## 2014-02-18 DIAGNOSIS — M2042 Other hammer toe(s) (acquired), left foot: Secondary | ICD-10-CM | POA: Diagnosis not present

## 2014-03-09 DIAGNOSIS — H02833 Dermatochalasis of right eye, unspecified eyelid: Secondary | ICD-10-CM | POA: Diagnosis not present

## 2014-03-09 DIAGNOSIS — H01003 Unspecified blepharitis right eye, unspecified eyelid: Secondary | ICD-10-CM | POA: Diagnosis not present

## 2014-03-09 DIAGNOSIS — M47816 Spondylosis without myelopathy or radiculopathy, lumbar region: Secondary | ICD-10-CM | POA: Diagnosis not present

## 2014-03-09 DIAGNOSIS — H2513 Age-related nuclear cataract, bilateral: Secondary | ICD-10-CM | POA: Diagnosis not present

## 2014-03-09 DIAGNOSIS — H02836 Dermatochalasis of left eye, unspecified eyelid: Secondary | ICD-10-CM | POA: Diagnosis not present

## 2014-03-09 DIAGNOSIS — H01006 Unspecified blepharitis left eye, unspecified eyelid: Secondary | ICD-10-CM | POA: Diagnosis not present

## 2014-03-09 DIAGNOSIS — H3531 Nonexudative age-related macular degeneration: Secondary | ICD-10-CM | POA: Diagnosis not present

## 2014-03-09 DIAGNOSIS — M545 Low back pain: Secondary | ICD-10-CM | POA: Diagnosis not present

## 2014-03-09 DIAGNOSIS — G629 Polyneuropathy, unspecified: Secondary | ICD-10-CM | POA: Diagnosis not present

## 2014-03-09 DIAGNOSIS — H5203 Hypermetropia, bilateral: Secondary | ICD-10-CM | POA: Diagnosis not present

## 2014-03-09 DIAGNOSIS — M5126 Other intervertebral disc displacement, lumbar region: Secondary | ICD-10-CM | POA: Diagnosis not present

## 2014-04-10 DIAGNOSIS — M47816 Spondylosis without myelopathy or radiculopathy, lumbar region: Secondary | ICD-10-CM | POA: Diagnosis not present

## 2014-05-13 DIAGNOSIS — B351 Tinea unguium: Secondary | ICD-10-CM | POA: Diagnosis not present

## 2014-05-13 DIAGNOSIS — M79675 Pain in left toe(s): Secondary | ICD-10-CM | POA: Diagnosis not present

## 2014-05-13 DIAGNOSIS — M79674 Pain in right toe(s): Secondary | ICD-10-CM | POA: Diagnosis not present

## 2014-05-14 DIAGNOSIS — E039 Hypothyroidism, unspecified: Secondary | ICD-10-CM | POA: Diagnosis not present

## 2014-05-14 DIAGNOSIS — I519 Heart disease, unspecified: Secondary | ICD-10-CM | POA: Diagnosis not present

## 2014-05-14 DIAGNOSIS — F418 Other specified anxiety disorders: Secondary | ICD-10-CM | POA: Diagnosis not present

## 2014-05-14 DIAGNOSIS — I10 Essential (primary) hypertension: Secondary | ICD-10-CM | POA: Diagnosis not present

## 2014-05-14 DIAGNOSIS — E782 Mixed hyperlipidemia: Secondary | ICD-10-CM | POA: Diagnosis not present

## 2014-05-14 DIAGNOSIS — I2510 Atherosclerotic heart disease of native coronary artery without angina pectoris: Secondary | ICD-10-CM | POA: Diagnosis not present

## 2014-05-19 DIAGNOSIS — M47816 Spondylosis without myelopathy or radiculopathy, lumbar region: Secondary | ICD-10-CM | POA: Diagnosis not present

## 2014-06-08 DIAGNOSIS — M545 Low back pain: Secondary | ICD-10-CM | POA: Diagnosis not present

## 2014-07-06 DIAGNOSIS — L97511 Non-pressure chronic ulcer of other part of right foot limited to breakdown of skin: Secondary | ICD-10-CM | POA: Diagnosis not present

## 2014-07-06 DIAGNOSIS — M2041 Other hammer toe(s) (acquired), right foot: Secondary | ICD-10-CM | POA: Diagnosis not present

## 2014-07-06 DIAGNOSIS — M2042 Other hammer toe(s) (acquired), left foot: Secondary | ICD-10-CM | POA: Diagnosis not present

## 2014-07-09 DIAGNOSIS — M5117 Intervertebral disc disorders with radiculopathy, lumbosacral region: Secondary | ICD-10-CM | POA: Diagnosis not present

## 2014-07-09 DIAGNOSIS — M545 Low back pain: Secondary | ICD-10-CM | POA: Diagnosis not present

## 2014-07-21 DIAGNOSIS — M5416 Radiculopathy, lumbar region: Secondary | ICD-10-CM | POA: Diagnosis not present

## 2014-07-21 DIAGNOSIS — M5417 Radiculopathy, lumbosacral region: Secondary | ICD-10-CM | POA: Diagnosis not present

## 2014-07-22 DIAGNOSIS — B351 Tinea unguium: Secondary | ICD-10-CM | POA: Diagnosis not present

## 2014-07-22 DIAGNOSIS — M2042 Other hammer toe(s) (acquired), left foot: Secondary | ICD-10-CM | POA: Diagnosis not present

## 2014-07-22 DIAGNOSIS — M2041 Other hammer toe(s) (acquired), right foot: Secondary | ICD-10-CM | POA: Diagnosis not present

## 2014-07-22 DIAGNOSIS — S90821A Blister (nonthermal), right foot, initial encounter: Secondary | ICD-10-CM | POA: Diagnosis not present

## 2014-07-22 DIAGNOSIS — M2012 Hallux valgus (acquired), left foot: Secondary | ICD-10-CM | POA: Diagnosis not present

## 2014-07-22 DIAGNOSIS — M79675 Pain in left toe(s): Secondary | ICD-10-CM | POA: Diagnosis not present

## 2014-07-22 DIAGNOSIS — M2011 Hallux valgus (acquired), right foot: Secondary | ICD-10-CM | POA: Diagnosis not present

## 2014-07-22 DIAGNOSIS — M79674 Pain in right toe(s): Secondary | ICD-10-CM | POA: Diagnosis not present

## 2014-09-02 ENCOUNTER — Ambulatory Visit (INDEPENDENT_AMBULATORY_CARE_PROVIDER_SITE_OTHER): Payer: Medicare Other | Admitting: Family Medicine

## 2014-09-02 ENCOUNTER — Encounter: Payer: Self-pay | Admitting: Family Medicine

## 2014-09-02 VITALS — BP 121/79 | HR 67 | Temp 97.8°F | Resp 16 | Ht 61.25 in | Wt 185.0 lb

## 2014-09-02 DIAGNOSIS — E785 Hyperlipidemia, unspecified: Secondary | ICD-10-CM | POA: Diagnosis not present

## 2014-09-02 DIAGNOSIS — Z23 Encounter for immunization: Secondary | ICD-10-CM | POA: Diagnosis not present

## 2014-09-02 DIAGNOSIS — L089 Local infection of the skin and subcutaneous tissue, unspecified: Secondary | ICD-10-CM

## 2014-09-02 DIAGNOSIS — R61 Generalized hyperhidrosis: Secondary | ICD-10-CM | POA: Diagnosis not present

## 2014-09-02 DIAGNOSIS — E039 Hypothyroidism, unspecified: Secondary | ICD-10-CM | POA: Diagnosis not present

## 2014-09-02 DIAGNOSIS — L97521 Non-pressure chronic ulcer of other part of left foot limited to breakdown of skin: Secondary | ICD-10-CM

## 2014-09-02 DIAGNOSIS — R42 Dizziness and giddiness: Secondary | ICD-10-CM

## 2014-09-02 MED ORDER — AMOXICILLIN-POT CLAVULANATE 875-125 MG PO TABS: 1.0000 | ORAL_TABLET | Freq: Two times a day (BID) | ORAL | Status: DC

## 2014-09-02 NOTE — Patient Instructions (Signed)
Soak left foot in epsom salts/warm water for 20 min at least once a day.

## 2014-09-02 NOTE — Progress Notes (Signed)
Pre visit review using our clinic review tool, if applicable. No additional management support is needed unless otherwise documented below in the visit note. 

## 2014-09-02 NOTE — Progress Notes (Signed)
Office Note 09/02/2014  CC:  Chief Complaint  Patient presents with  . Establish Care   HPI:  Katherine Best is a 81 y.o. White female who is here to establish care.  She was a patient of Dr. Linda Hedges at [facility] in the past, then moved away to [place] in 2014.  She recently moved back into the area in [facility]. Patient's most recent primary MD: in [place]-C. Lenon Curt, MD at [facility]. Old records in [facility] EMR were reviewed prior to or during today's visit.  She says it has been at least 6 mo since her last lab work was done in [place].  She is not fasting today. Says she has felt lightheaded and clammy all day, "I don't know why".  Denies CP, palpitations, or SOB. Denies nausea.   Past Medical History  Diagnosis Date  . Unspecified venous (peripheral) insufficiency   . Tarsal tunnel syndrome   . Abdominal pain, right lower quadrant   . Other specified gastritis without mention of hemorrhage     hx of antral gastritis on EGD 05/2009  . Obesity, unspecified   . Liver function study, abnormal   . Menopausal syndrome   . External hemorrhoid   . Anxiety   . Interstitial cystitis   . Bursitis   . Hypothyroid   . IBS (irritable bowel syndrome)   . Morton's neuroma   . Hypertension   . Hyperlipidemia   . History of adenomatous polyp of colon   . Dyspnea     a. 09/2011 CTA Chest: No PE, Ca2+ cors;  b. 09/2011 R&L heart Cath: Relatively nl R heart pressures, nl co/ci, nonobs cad, nl EF.  . Diverticulosis of colon     noted on colonoscopy 2008  . Lumbar degenerative disc disease     Has gotten repeated LB injections, most recently April 2016 in [place].  . Depression     Past Surgical History  Procedure Laterality Date  . Abdominal hysterectomy      with BSO  . Tonsillectomy    . Foot surgery    . Oophorectomy    . Left tarsal tunnel release      sept '11 (Dr Beola Cord)  . Colonoscopy  2002/2005, and 11/14/06    polyps 2002 but none 2005 or 2008.   Recall recommended 7-10 yrs  . Hammer toe surgery      bilateral  . Ankle surgery      bilateral  . Esophagogastroduodenoscopy  05/2009    Moderate antral gastritis    Family History  Problem Relation Age of Onset  . Cancer Mother     Breast  . Hypertension Father   . Hyperlipidemia Father   . Colon cancer    . Diabetes Maternal Grandfather     History   Social History  . Marital Status: Married    Spouse Name: N/A  . Number of Children: N/A  . Years of Education: N/A   Occupational History  . Not on file.   Social History Main Topics  . Smoking status: Former Smoker -- 1.00 packs/day for 35 years    Types: Cigarettes    Quit date: 03/27/2002  . Smokeless tobacco: Never Used  . Alcohol Use: No  . Drug Use: No  . Sexual Activity: Not on file   Other Topics Concern  . Not on file   Social History Narrative   Married '57   3 sons- '60, '61, '64, grandchildren 58 (7 girls, 1  boy)   Lives independently with husband   Patient is a former smoker: quit 2004.   Alcohol use- yes socially not in years.   Illicit Drug use- no   Occupation: Biomedical scientist    Outpatient Encounter Prescriptions as of 09/02/2014  Medication Sig  . ALPRAZolam (XANAX) 0.25 MG tablet Take 1 tablet (0.25 mg total) by mouth at bedtime as needed for sleep or anxiety.  Marland Kitchen aspirin 81 MG tablet Take 81 mg by mouth daily.    . calcium carbonate (TUMS) 500 MG chewable tablet Chew 1 tablet by mouth 2 (two) times daily.    . cetirizine (ZYRTEC) 10 MG tablet Take 10 mg (1 tablet) by mouth 2 times a day (Patient taking differently: Take 10 mg by mouth daily. Take 10 mg (1 tablet) by mouth 2 times a day)  . ciprofloxacin (CIPRO) 250 MG tablet Take 125 mg by mouth daily with breakfast. Dr. Karsten Ro for UTI prevention  . DULoxetine (CYMBALTA) 60 MG capsule Take 60 mg by mouth daily.  Marland Kitchen HYDROcodone-acetaminophen (NORCO) 5-325 MG per tablet Take 1 tablet by mouth as needed. For back pain   . levothyroxine  (LEVOXYL) 25 MCG tablet Take 1 tablet (25 mcg total) by mouth daily.  . meloxicam (MOBIC) 15 MG tablet Take 15 mg by mouth daily.  . metoprolol (LOPRESSOR) 50 MG tablet Take 1 tablet (50 mg total) by mouth 2 (two) times daily.  . mirtazapine (REMERON) 15 MG tablet Take 1 tablet (15 mg total) by mouth as directed. :1 or 2 at bedtime for pruritis  . MULTIPLE VITAMIN PO Take by mouth daily.    Marland Kitchen oxybutynin (DITROPAN-XL) 10 MG 24 hr tablet Take 10 mg by mouth daily.  . potassium chloride SA (K-DUR,KLOR-CON) 20 MEQ tablet Take 1 tablet (20 mEq total) by mouth daily.  . pregabalin (LYRICA) 50 MG capsule Take 1 capsule (50 mg total) by mouth 3 (three) times daily.  Marland Kitchen amoxicillin-clavulanate (AUGMENTIN) 875-125 MG per tablet Take 1 tablet by mouth 2 (two) times daily.  . benzonatate (TESSALON) 100 MG capsule Take 100 mg by mouth 3 (three) times daily as needed.    . hyoscyamine (LEVBID) 0.375 MG 12 hr tablet Take 0.375 mg by mouth every 12 (twelve) hours as needed.    . loperamide (IMODIUM A-D) 2 MG tablet Take 2 mg by mouth as needed.    Marland Kitchen losartan-hydrochlorothiazide (HYZAAR) 50-12.5 MG per tablet Take 1 tablet by mouth daily.  Marland Kitchen omeprazole (PRILOSEC) 20 MG capsule Take 1 capsule (20 mg total) by mouth daily.  . pravastatin (PRAVACHOL) 20 MG tablet Take 1 tablet (20 mg total) by mouth every evening.  Addison Lank Hazel (TUCKS EX) Apply topically as directed.  . [DISCONTINUED] diphenhydrAMINE (BENADRYL) 25 MG tablet Take 25 mg by mouth every 6 (six) hours as needed.  . [DISCONTINUED] estradiol (ESTRACE) 1 MG tablet Take 1 tablet (1 mg total) by mouth daily.  . [DISCONTINUED] fluticasone (FLONASE) 50 MCG/ACT nasal spray Place 2 sprays into the nose 2 (two) times daily as needed.     No facility-administered encounter medications on file as of 09/02/2014.    Allergies  Allergen Reactions  . Gabapentin Itching  . Sulfonamide Derivatives     REACTION: Nausea   ROS Review of Systems  Constitutional:  Negative for fever and fatigue.  HENT: Negative for congestion and sore throat.   Eyes: Negative for visual disturbance.  Respiratory: Negative for cough, chest tightness and shortness of breath.   Cardiovascular: Negative for  chest pain and palpitations.  Gastrointestinal: Negative for nausea, abdominal pain and blood in stool.  Genitourinary: Negative for dysuria.  Musculoskeletal: Negative for joint swelling.  Skin: Negative for rash.  Neurological: Negative for weakness and headaches.  Hematological: Negative for adenopathy.    PE; Blood pressure 121/79, pulse 67, temperature 97.8 F (36.6 C), temperature source Oral, resp. rate 16, height 5' 1.25" (1.556 m), weight 185 lb (83.915 kg), SpO2 93 %. Gen: Alert, well appearing.  Patient is oriented to person, place, time, and situation. LFY:BOFB: no injection, icteris, swelling, or exudate.  EOMI, PERRLA. Mouth: lips without lesion/swelling.  Oral mucosa pink and moist. Oropharynx without erythema, exudate, or swelling.  CV: RRR, no m/r/g.   LUNGS: CTA bilat, nonlabored resps, good aeration in all lung fields. EXT: no clubbing, cyanosis, or edema.  Left foot 2nd toe, medial aspect  Pertinent labs:  12 lead EKG today: NSR, rate 72, low voltage aVL, no ST or T wave abnormalities.  No hypertrophy, no ectopy.  Compared to last EKG in EMR in 2014, the 1st deg AV block and PACs have resolved.  R wave transition improved.  ASSESSMENT AND PLAN:   New pt; re-establishing care.  1) HTN; The current medical regimen is effective;  continue present plan and medications. She will be re-establishing with Dr. Harrington Challenger in Cardiology. I don't think her current disequilibrium feeling and clamminess is due to any cardiopulmonary problem. Watchful waiting.  Signs/symptoms to call or return for were reviewed and pt expressed understanding.  2) Hypothyroidism: TSH monitoring today.  3) Chronic LB pain: she will re-establish with Dr. Nelva Bush.  4)  Postmenopausal vasomotor sx's: she has been on estradiol for this for "years" and does want to try to stop this med, and I definitely agree with d/c of this med at this time.  5) L foot 2nd toe ulcer: limited to breakdown of skin. Surrounding erythema + ? Tiny drainage tract: will do trial of augmentin 875mg  bid x 10d. Soak in warm water+ epsom salts 20 min qd, continue insert between toes to keep friction off this area. Recheck in 3 wks and if not improved will ask wound clinic to see her.  Recheck wound  6) Preventative health care: Prevnar 13 IM today.  She'll return at her earliest convenience for fasting labs.  An After Visit Summary was printed and given to the patient.  Return in about 3 weeks (around 09/23/2014) for f/u toe ulcer/infection.

## 2014-09-03 ENCOUNTER — Other Ambulatory Visit (INDEPENDENT_AMBULATORY_CARE_PROVIDER_SITE_OTHER): Payer: Medicare Other

## 2014-09-03 DIAGNOSIS — E039 Hypothyroidism, unspecified: Secondary | ICD-10-CM | POA: Diagnosis not present

## 2014-09-03 DIAGNOSIS — L089 Local infection of the skin and subcutaneous tissue, unspecified: Secondary | ICD-10-CM

## 2014-09-03 DIAGNOSIS — R42 Dizziness and giddiness: Secondary | ICD-10-CM | POA: Diagnosis not present

## 2014-09-03 DIAGNOSIS — R61 Generalized hyperhidrosis: Secondary | ICD-10-CM

## 2014-09-03 DIAGNOSIS — L97521 Non-pressure chronic ulcer of other part of left foot limited to breakdown of skin: Secondary | ICD-10-CM | POA: Diagnosis not present

## 2014-09-03 DIAGNOSIS — E785 Hyperlipidemia, unspecified: Secondary | ICD-10-CM | POA: Diagnosis not present

## 2014-09-03 LAB — COMPREHENSIVE METABOLIC PANEL
ALT: 32 U/L (ref 0–35)
AST: 33 U/L (ref 0–37)
Albumin: 4.1 g/dL (ref 3.5–5.2)
Alkaline Phosphatase: 89 U/L (ref 39–117)
BUN: 16 mg/dL (ref 6–23)
CALCIUM: 9.6 mg/dL (ref 8.4–10.5)
CO2: 30 meq/L (ref 19–32)
Chloride: 100 mEq/L (ref 96–112)
Creatinine, Ser: 0.61 mg/dL (ref 0.40–1.20)
GFR: 100.68 mL/min (ref >60.00)
Glucose, Bld: 140 mg/dL — ABNORMAL HIGH (ref 70–99)
Potassium: 4.4 mEq/L (ref 3.5–5.1)
SODIUM: 137 meq/L (ref 135–145)
Total Bilirubin: 0.4 mg/dL (ref 0.2–1.2)
Total Protein: 6.4 g/dL (ref 6.0–8.3)

## 2014-09-03 LAB — CBC WITH DIFFERENTIAL/PLATELET
BASOS ABS: 0.2 10*3/uL — AB (ref 0.0–0.1)
Basophils Relative: 2.5 % (ref 0.0–3.0)
Eosinophils Absolute: 0.3 10*3/uL (ref 0.0–0.7)
Eosinophils Relative: 3.5 % (ref 0.0–5.0)
HCT: 39.3 % (ref 36.0–46.0)
HEMOGLOBIN: 12.8 g/dL (ref 12.0–15.0)
LYMPHS ABS: 1.4 10*3/uL (ref 0.7–4.0)
LYMPHS PCT: 16.4 % (ref 12.0–46.0)
MCHC: 32.7 g/dL (ref 30.0–36.0)
MCV: 95.1 fl (ref 78.0–100.0)
Monocytes Absolute: 0.7 10*3/uL (ref 0.1–1.0)
Monocytes Relative: 7.9 % (ref 3.0–12.0)
NEUTROS ABS: 6 10*3/uL (ref 1.4–7.7)
Neutrophils Relative %: 69.7 % (ref 43.0–77.0)
PLATELETS: 346 10*3/uL (ref 150.0–400.0)
RBC: 4.13 Mil/uL (ref 3.87–5.11)
RDW: 14.8 % (ref 11.5–15.5)
WBC: 8.6 10*3/uL (ref 4.0–10.5)

## 2014-09-03 LAB — LIPID PANEL
CHOL/HDL RATIO: 5
Cholesterol: 177 mg/dL (ref 0–200)
HDL: 39 mg/dL — AB (ref >39.00)
NonHDL: 138
Triglycerides: 291 mg/dL — ABNORMAL HIGH (ref 0.0–149.0)
VLDL: 58.2 mg/dL — ABNORMAL HIGH (ref 0.0–40.0)

## 2014-09-03 LAB — TSH: TSH: 2.77 u[IU]/mL (ref 0.35–4.50)

## 2014-09-03 LAB — LDL CHOLESTEROL, DIRECT: Direct LDL: 99 mg/dL

## 2014-09-04 ENCOUNTER — Encounter: Payer: Self-pay | Admitting: Family Medicine

## 2014-09-04 ENCOUNTER — Other Ambulatory Visit (INDEPENDENT_AMBULATORY_CARE_PROVIDER_SITE_OTHER): Payer: Medicare Other

## 2014-09-04 ENCOUNTER — Other Ambulatory Visit: Payer: Self-pay | Admitting: Family Medicine

## 2014-09-04 DIAGNOSIS — R7301 Impaired fasting glucose: Secondary | ICD-10-CM | POA: Diagnosis not present

## 2014-09-04 LAB — HEMOGLOBIN A1C: Hgb A1c MFr Bld: 6.6 % — ABNORMAL HIGH (ref 4.6–6.5)

## 2014-09-07 ENCOUNTER — Encounter: Payer: Self-pay | Admitting: Family Medicine

## 2014-09-07 ENCOUNTER — Telehealth: Payer: Self-pay | Admitting: Family Medicine

## 2014-09-07 MED ORDER — METFORMIN HCL 500 MG PO TABS: ORAL_TABLET | ORAL | Status: DC

## 2014-09-07 MED ORDER — PRAVASTATIN SODIUM 40 MG PO TABS: 40.0000 mg | ORAL_TABLET | Freq: Every day | ORAL | Status: DC

## 2014-09-07 NOTE — Telephone Encounter (Signed)
LOV 09/02/14, up coming ov 09/29/14. Please review and advise. Thanks.

## 2014-09-07 NOTE — Telephone Encounter (Signed)
Pt called and was able to get Pharmacy to take back her 20 mg Pravastain. They have asked that you call on a 40mg  dose px. She also has decided to go ahead and take a rx for her borderline high blood sugar so please call that in as well. Mechele Claude asked that she be called when these rx's are called in.

## 2014-09-07 NOTE — Telephone Encounter (Signed)
eRx's sent in for pravastatin 40mg  qd and metformin 500 mg bid.

## 2014-09-07 NOTE — Telephone Encounter (Signed)
Pt advised and voiced understanding.   

## 2014-09-21 DIAGNOSIS — N390 Urinary tract infection, site not specified: Secondary | ICD-10-CM | POA: Diagnosis not present

## 2014-09-21 DIAGNOSIS — N3010 Interstitial cystitis (chronic) without hematuria: Secondary | ICD-10-CM | POA: Diagnosis not present

## 2014-09-29 ENCOUNTER — Encounter: Payer: Self-pay | Admitting: Family Medicine

## 2014-09-29 ENCOUNTER — Ambulatory Visit (INDEPENDENT_AMBULATORY_CARE_PROVIDER_SITE_OTHER): Payer: Medicare Other | Admitting: Family Medicine

## 2014-09-29 ENCOUNTER — Other Ambulatory Visit: Payer: Self-pay | Admitting: *Deleted

## 2014-09-29 VITALS — BP 132/64 | HR 64 | Temp 97.4°F | Resp 16 | Ht 61.25 in | Wt 183.0 lb

## 2014-09-29 DIAGNOSIS — L84 Corns and callosities: Secondary | ICD-10-CM

## 2014-09-29 DIAGNOSIS — E11628 Type 2 diabetes mellitus with other skin complications: Secondary | ICD-10-CM | POA: Diagnosis not present

## 2014-09-29 NOTE — Progress Notes (Signed)
OFFICE NOTE  09/29/2014  CC:  Chief Complaint  Patient presents with  . Follow-up    3 week f/u. Pt is not fasting.    HPI: Patient is a 81 y.o. Caucasian female who is here for 3 wks f/u L foot 2nd toe ulcer. Says the ulcer looks dry, improved. She has no sensation from ankles down into feet: chronic (combo of tarsal tunnel syndrome + surgery bilat + DPN).    Pertinent PMH:  Past medical, surgical, social, and family history reviewed and no changes are noted since last office visit.  MEDS:  Outpatient Prescriptions Prior to Visit  Medication Sig Dispense Refill  . ALPRAZolam (XANAX) 0.25 MG tablet Take 1 tablet (0.25 mg total) by mouth at bedtime as needed for sleep or anxiety. 30 tablet 2  . aspirin 81 MG tablet Take 81 mg by mouth daily.      . calcium carbonate (TUMS) 500 MG chewable tablet Chew 1 tablet by mouth 2 (two) times daily.      . cetirizine (ZYRTEC) 10 MG tablet Take 10 mg (1 tablet) by mouth 2 times a day (Patient taking differently: Take 10 mg by mouth daily. Take 10 mg (1 tablet) by mouth 2 times a day) 60 tablet 11  . DULoxetine (CYMBALTA) 60 MG capsule Take 60 mg by mouth daily.    Marland Kitchen HYDROcodone-acetaminophen (NORCO) 5-325 MG per tablet Take 1 tablet by mouth as needed. For back pain     . hyoscyamine (LEVBID) 0.375 MG 12 hr tablet Take 0.375 mg by mouth every 12 (twelve) hours as needed.      Marland Kitchen levothyroxine (LEVOXYL) 25 MCG tablet Take 1 tablet (25 mcg total) by mouth daily. 90 tablet 3  . loperamide (IMODIUM A-D) 2 MG tablet Take 2 mg by mouth as needed.      . meloxicam (MOBIC) 15 MG tablet Take 15 mg by mouth daily.    . metFORMIN (GLUCOPHAGE) 500 MG tablet 1 tab po bid 180 tablet 3  . metoprolol (LOPRESSOR) 50 MG tablet Take 1 tablet (50 mg total) by mouth 2 (two) times daily. 180 tablet 3  . mirtazapine (REMERON) 15 MG tablet Take 1 tablet (15 mg total) by mouth as directed. :1 or 2 at bedtime for pruritis 180 tablet 1  . MULTIPLE VITAMIN PO Take by mouth  daily.      Marland Kitchen oxybutynin (DITROPAN-XL) 10 MG 24 hr tablet Take 10 mg by mouth daily.    . potassium chloride SA (K-DUR,KLOR-CON) 20 MEQ tablet Take 1 tablet (20 mEq total) by mouth daily. 90 tablet 3  . pravastatin (PRAVACHOL) 40 MG tablet Take 1 tablet (40 mg total) by mouth daily. 90 tablet 3  . pregabalin (LYRICA) 50 MG capsule Take 1 capsule (50 mg total) by mouth 3 (three) times daily. 270 capsule 1  . losartan-hydrochlorothiazide (HYZAAR) 50-12.5 MG per tablet Take 1 tablet by mouth daily. 90 tablet 3  . omeprazole (PRILOSEC) 20 MG capsule Take 1 capsule (20 mg total) by mouth daily. 90 capsule 3  . amoxicillin-clavulanate (AUGMENTIN) 875-125 MG per tablet Take 1 tablet by mouth 2 (two) times daily. (Patient not taking: Reported on 09/29/2014) 20 tablet 0  . benzonatate (TESSALON) 100 MG capsule Take 100 mg by mouth 3 (three) times daily as needed.      . ciprofloxacin (CIPRO) 250 MG tablet Take 125 mg by mouth daily with breakfast. Dr. Karsten Ro for UTI prevention    . Witch Hazel (TUCKS EX) Apply topically as  directed.     No facility-administered medications prior to visit.    PE: Blood pressure 132/64, pulse 64, temperature 97.4 F (36.3 C), temperature source Oral, resp. rate 16, height 5' 1.25" (1.556 m), weight 183 lb (83.008 kg), SpO2 95 %. Gen: Alert, well appearing.  Patient is oriented to person, place, time, and situation. EXT: trace bilat pitting edema in both LL's. DP and PT pulses intact bilat. L foot 2nd toe, medial aspect with dry callus/hyperkeratotic oval-shaped area w/out erythema or tenderness.   LABS: Lab Results  Component Value Date   HGBA1C 6.6* 09/04/2014    Lab Results  Component Value Date   TSH 2.77 09/03/2014   Lab Results  Component Value Date   WBC 8.6 09/03/2014   HGB 12.8 09/03/2014   HCT 39.3 09/03/2014   MCV 95.1 09/03/2014   PLT 346.0 09/03/2014   Lab Results  Component Value Date   CREATININE 0.61 09/03/2014   BUN 16 09/03/2014    NA 137 09/03/2014   K 4.4 09/03/2014   CL 100 09/03/2014   CO2 30 09/03/2014   Lab Results  Component Value Date   ALT 32 09/03/2014   AST 33 09/03/2014   ALKPHOS 89 09/03/2014   BILITOT 0.4 09/03/2014   Lab Results  Component Value Date   CHOL 177 09/03/2014   Lab Results  Component Value Date   HDL 39.00* 09/03/2014   Lab Results  Component Value Date   LDLCALC DEL 05/02/2007   Lab Results  Component Value Date   TRIG 291.0* 09/03/2014   Lab Results  Component Value Date   CHOLHDL 5 09/03/2014   IMPRESSION AND PLAN:  DM 2 with DPN, left foot 2nd toe pressure callus.  It used to look ulcerated/wet but now dried up and has a callus. I used dermablade today to shave the callused area and this went well w/out immediate complication, pt tolerated this well.  I did cause a tiny amount of bleeding b/c of shaving a bit too deep.  Band-aid applied, wound care discussed. Signs/symptoms to call or return for were reviewed and pt expressed understanding.  An After Visit Summary was printed and given to the patient.  FOLLOW UP: 3 mo

## 2014-09-29 NOTE — Progress Notes (Signed)
Pre visit review using our clinic review tool, if applicable. No additional management support is needed unless otherwise documented below in the visit note. 

## 2014-09-29 NOTE — Telephone Encounter (Signed)
RF request for duloxetine.  LOV: 09/29/14 Next ov:  12/30/14 Last written: unknown Requesting #90 supply Please advise. Thanks.

## 2014-09-30 MED ORDER — DULOXETINE HCL 60 MG PO CPEP: 60.0000 mg | ORAL_CAPSULE | Freq: Every day | ORAL | Status: DC

## 2014-09-30 NOTE — Telephone Encounter (Signed)
Pt LMOM asking about this refill, I approved Rx for #90 w/ 1RF.

## 2014-10-02 ENCOUNTER — Other Ambulatory Visit: Payer: Self-pay

## 2014-10-02 DIAGNOSIS — Z1231 Encounter for screening mammogram for malignant neoplasm of breast: Secondary | ICD-10-CM

## 2014-10-05 ENCOUNTER — Encounter: Payer: Self-pay | Admitting: Family Medicine

## 2014-10-06 ENCOUNTER — Encounter: Payer: Self-pay | Admitting: Family Medicine

## 2014-10-08 ENCOUNTER — Ambulatory Visit
Admission: RE | Admit: 2014-10-08 | Discharge: 2014-10-08 | Disposition: A | Discharge status: Home / Self Care | Payer: Medicare Other | Source: Ambulatory Visit

## 2014-10-08 DIAGNOSIS — Z1231 Encounter for screening mammogram for malignant neoplasm of breast: Secondary | ICD-10-CM

## 2014-10-08 DIAGNOSIS — L602 Onychogryphosis: Secondary | ICD-10-CM | POA: Diagnosis not present

## 2014-10-08 DIAGNOSIS — E1151 Type 2 diabetes mellitus with diabetic peripheral angiopathy without gangrene: Secondary | ICD-10-CM | POA: Diagnosis not present

## 2014-10-08 DIAGNOSIS — L84 Corns and callosities: Secondary | ICD-10-CM | POA: Diagnosis not present

## 2014-10-08 DIAGNOSIS — M205X2 Other deformities of toe(s) (acquired), left foot: Secondary | ICD-10-CM | POA: Diagnosis not present

## 2014-10-11 ENCOUNTER — Encounter: Payer: Self-pay | Admitting: Family Medicine

## 2014-10-12 ENCOUNTER — Other Ambulatory Visit: Payer: Self-pay | Admitting: *Deleted

## 2014-10-12 MED ORDER — MELOXICAM 15 MG PO TABS: ORAL_TABLET | ORAL | Status: DC

## 2014-10-12 MED ORDER — LEVOTHYROXINE SODIUM 25 MCG PO TABS: 25.0000 ug | ORAL_TABLET | Freq: Every day | ORAL | Status: DC

## 2014-10-12 NOTE — Telephone Encounter (Signed)
Pt called requesting refill for Meloxicam and levothyroxine. LOV: 09/29/14 NOV: 12/30/14  TSH: 2.77 on 09/03/14  Meloxicam last written: unknown Levothyroxine last written: 07/10/12 #90 w/ 3RF  She would like RFs for 90 day supply. Pharm: [facility]   Please advise. Thanks.

## 2014-11-03 ENCOUNTER — Telehealth: Payer: Self-pay | Admitting: *Deleted

## 2014-11-03 NOTE — Telephone Encounter (Signed)
May try Herbal med called black cohosh, take 1-2 of the 40 mg caplets twice per day.

## 2014-11-03 NOTE — Telephone Encounter (Addendum)
Pt Katherine Best on 11/03/14 at 10:11am stating that Dr. [name] took her off estradiol and now she is having hot flashes again. She stated that she wants to know if there is something else she can take or do to help with the hot flashes. Please advise. Thanks.

## 2014-11-03 NOTE — Telephone Encounter (Signed)
Pt advised and voiced understanding.   

## 2014-11-05 DIAGNOSIS — N3020 Other chronic cystitis without hematuria: Secondary | ICD-10-CM | POA: Diagnosis not present

## 2014-11-05 DIAGNOSIS — N390 Urinary tract infection, site not specified: Secondary | ICD-10-CM | POA: Diagnosis not present

## 2014-11-08 ENCOUNTER — Encounter: Payer: Self-pay | Admitting: Family Medicine

## 2014-11-09 ENCOUNTER — Other Ambulatory Visit: Payer: Self-pay | Admitting: Geriatric Medicine

## 2014-11-09 DIAGNOSIS — H53483 Generalized contraction of visual field, bilateral: Secondary | ICD-10-CM | POA: Diagnosis not present

## 2014-11-09 DIAGNOSIS — H02834 Dermatochalasis of left upper eyelid: Secondary | ICD-10-CM | POA: Diagnosis not present

## 2014-11-09 DIAGNOSIS — H02831 Dermatochalasis of right upper eyelid: Secondary | ICD-10-CM | POA: Diagnosis not present

## 2014-11-09 DIAGNOSIS — H1132 Conjunctival hemorrhage, left eye: Secondary | ICD-10-CM | POA: Diagnosis not present

## 2014-11-09 DIAGNOSIS — L918 Other hypertrophic disorders of the skin: Secondary | ICD-10-CM | POA: Diagnosis not present

## 2014-11-09 DIAGNOSIS — H02413 Mechanical ptosis of bilateral eyelids: Secondary | ICD-10-CM | POA: Diagnosis not present

## 2014-11-09 MED ORDER — OXYBUTYNIN CHLORIDE ER 10 MG PO TB24: 10.0000 mg | ORAL_TABLET | Freq: Every day | ORAL | Status: DC

## 2014-11-09 NOTE — Telephone Encounter (Signed)
Rx sent in

## 2014-11-10 DIAGNOSIS — M2042 Other hammer toe(s) (acquired), left foot: Secondary | ICD-10-CM | POA: Diagnosis not present

## 2014-11-12 DIAGNOSIS — H1132 Conjunctival hemorrhage, left eye: Secondary | ICD-10-CM | POA: Diagnosis not present

## 2014-11-12 DIAGNOSIS — H1130 Conjunctival hemorrhage, unspecified eye: Secondary | ICD-10-CM | POA: Diagnosis not present

## 2014-11-19 DIAGNOSIS — Z87440 Personal history of urinary (tract) infections: Secondary | ICD-10-CM | POA: Diagnosis not present

## 2014-11-23 ENCOUNTER — Encounter: Payer: Self-pay | Admitting: Internal Medicine

## 2014-11-23 ENCOUNTER — Ambulatory Visit (INDEPENDENT_AMBULATORY_CARE_PROVIDER_SITE_OTHER): Payer: Medicare Other | Admitting: Internal Medicine

## 2014-11-23 VITALS — BP 110/62 | HR 66 | Ht 62.0 in | Wt 183.5 lb

## 2014-11-23 DIAGNOSIS — E785 Hyperlipidemia, unspecified: Secondary | ICD-10-CM | POA: Diagnosis not present

## 2014-11-23 MED ORDER — ATORVASTATIN CALCIUM 20 MG PO TABS: 20.0000 mg | ORAL_TABLET | Freq: Every day | ORAL | Status: DC

## 2014-11-23 NOTE — Progress Notes (Addendum)
Cardiology Office Note   Date:  11/23/2014   ID:  Katherine Best, Katherine Best May 08, 1933, MRN [id number]  PCP:  Tammi Sou, MD  Cardiologist:   Dorris Carnes, MD   No chief complaint on file.     History of Present Illness: Katherine Best is a 81 y.o. female with a history of Mild CAD by cath wint normal R heart pressures  Pt with mild diastolic dysfunction and DM    I saw her in APril 2014.   Lipds in June 2014 LDL was  Pt moved to [place] for 2 years  Did not like it  Moved back    Says she denies CP  Breathing pretty good  Doesn't have energy  Says she is lazy  Thinking of joining gym        Current Outpatient Prescriptions  Medication Sig Dispense Refill  . DULoxetine (CYMBALTA) 60 MG capsule Take 60 mg by mouth daily.  1  . levothyroxine (SYNTHROID, LEVOTHROID) 25 MCG tablet Take 25 mcg by mouth daily.  3  . LYRICA 50 MG capsule Take 50 mg by mouth 2 (two) times daily.  5  . meloxicam (MOBIC) 15 MG tablet TAKE ONE TABLET BY MOUTH DAILY AS NEEDED FOR PAIN  3  . metFORMIN (GLUCOPHAGE) 500 MG tablet Take 500 mg by mouth 2 (two) times daily.  3  . metoprolol (LOPRESSOR) 50 MG tablet Take 50 mg by mouth 2 (two) times daily.  0  . oxybutynin (DITROPAN-XL) 10 MG 24 hr tablet Take 10 mg by mouth daily.  3  . potassium chloride SA (K-DUR,KLOR-CON) 20 MEQ tablet Take 20 mEq by mouth daily.  0  . pravastatin (PRAVACHOL) 40 MG tablet Take 40 mg by mouth daily.  3   No current facility-administered medications for this visit.    Allergies:   Gabapentin and Sulfonamide derivatives   Past Medical History  Diagnosis Date  . Unspecified venous (peripheral) insufficiency   . Tarsal tunnel syndrome   . Abdominal pain, right lower quadrant   . Other specified gastritis without mention of hemorrhage     hx of antral gastritis on EGD 05/2009  . Obesity, unspecified   . Liver function study, abnormal   . Menopausal syndrome   . External hemorrhoid   . Anxiety   . Interstitial  cystitis   . Bursitis   . Hypothyroidism   . IBS (irritable bowel syndrome)   . Morton's neuroma   . Hypertension   . Hyperlipidemia   . History of adenomatous polyp of colon   . Dyspnea     a. 09/2011 CTA Chest: No PE, Ca2+ cors;  b. 09/2011 R&L heart Cath: Relatively nl R heart pressures, nl co/ci, nonobs cad, nl EF.  . Diverticulosis of colon     noted on colonoscopy 2008  . Lumbar degenerative disc disease     Has gotten repeated LB injections, most recently April 2016 in [place].  . Depression   . Diabetes mellitus with skin complications   . Peripheral neuropathy     per EMG 11/2012.  Intolerant of gabapentin, failed pamelor,    Past Surgical History  Procedure Laterality Date  . Abdominal hysterectomy      with BSO  . Tonsillectomy    . Foot surgery      R foot bunionectomy and arthroplasty of digits R foot.  Arthroplasty digits 3 and 4 L foot.  . Oophorectomy    . Left tarsal tunnel release  sept '11 (Dr Beola Cord)  . Colonoscopy  2002/2005, and 11/14/06    polyps 2002 but none 2005 or 2008.  Recall recommended 7-10 yrs  . Hammer toe surgery      bilateral  . Ankle surgery      bilateral  . Esophagogastroduodenoscopy  05/2009    Moderate antral gastritis     Social History:  The patient  reports that she quit smoking about 12 years ago. Her smoking use included Cigarettes. She has a 35 pack-year smoking history. She has never used smokeless tobacco. She reports that she does not drink alcohol or use illicit drugs.   Family History:  The patient's family history includes Cancer in her mother; Colon cancer in an other family member; Diabetes in her maternal grandfather; Hyperlipidemia in her father; Hypertension in her father.    ROS:  Please see the history of present illness. All other systems are reviewed and  Negative to the above problem except as noted.    PHYSICAL EXAM: VS:  BP 110/62 mmHg  Pulse 66  Ht 5\' 2"  (1.575 m)  Wt 183 lb 8 oz (83.235 kg)  BMI  33.55 kg/m2  SpO2 95%  GEN: Well nourished, well developed, in no acute distress HEENT: normal Neck: no JVD, carotid bruits, or masses Cardiac: RRR; no murmurs, rubs, or gallops,no edema  Respiratory:  clear to auscultation bilaterally, normal work of breathing GI: soft, nontender, nondistended, + BS  No hepatomegaly  MS: no deformity Moving all extremities   Skin: warm and dry, no rash Neuro:  Strength and sensation are intact Psych: euthymic mood, full affect   EKG:  EKG is not ordered today.  On 6/8:  SR 72  bpm    Lipid Panel    Component Value Date/Time   CHOL 177 09/03/2014 0826   TRIG 291.0* 09/03/2014 0826   HDL 39.00* 09/03/2014 0826   CHOLHDL 5 09/03/2014 0826   VLDL 58.2* 09/03/2014 0826   LDLCALC DEL 05/02/2007 1225   LDLDIRECT 99.0 09/03/2014 0826      Wt Readings from Last 3 Encounters:  11/23/14 183 lb 8 oz (83.235 kg)  09/29/14 183 lb (83.008 kg)  09/02/14 185 lb (83.915 kg)      ASSESSMENT AND PLAN:  1.  CAD  Mild disease  No symptoms  2.  Lipids  Switch to Lipitor  Tighter control  3.  DM  Watch carbs  F/U July     Signed, Saanya Zieske, MD  11/23/2014 9:05 AM    [address], [place], [place]  [address] Phone: [phone]; Fax: [phone]

## 2014-11-23 NOTE — Patient Instructions (Addendum)
Your physician wants you to follow-up in: 1 year with Dr Theressa Stamps will receive a reminder letter in the mail two months in advance. If you don't receive a letter, please call our office to schedule the follow-up appointment.   STOP Pravastatin   START Lipitor 20 mg daily    Please get FASTING lab work in October     Thank you for choosing [facility] !

## 2014-11-24 DIAGNOSIS — M47816 Spondylosis without myelopathy or radiculopathy, lumbar region: Secondary | ICD-10-CM | POA: Diagnosis not present

## 2014-11-24 DIAGNOSIS — M5136 Other intervertebral disc degeneration, lumbar region: Secondary | ICD-10-CM | POA: Diagnosis not present

## 2014-12-03 DIAGNOSIS — H02834 Dermatochalasis of left upper eyelid: Secondary | ICD-10-CM | POA: Diagnosis not present

## 2014-12-03 DIAGNOSIS — H02831 Dermatochalasis of right upper eyelid: Secondary | ICD-10-CM | POA: Diagnosis not present

## 2014-12-06 ENCOUNTER — Encounter: Payer: Self-pay | Admitting: Family Medicine

## 2014-12-07 ENCOUNTER — Telehealth: Payer: Self-pay | Admitting: *Deleted

## 2014-12-07 MED ORDER — POTASSIUM CHLORIDE CRYS ER 20 MEQ PO TBCR: 20.0000 meq | EXTENDED_RELEASE_TABLET | Freq: Every day | ORAL | Status: DC

## 2014-12-07 MED ORDER — OMEPRAZOLE 20 MG PO CPDR: 20.0000 mg | DELAYED_RELEASE_CAPSULE | Freq: Every day | ORAL | Status: DC

## 2014-12-07 MED ORDER — LOSARTAN POTASSIUM-HCTZ 50-12.5 MG PO TABS: 1.0000 | ORAL_TABLET | Freq: Every day | ORAL | Status: DC

## 2014-12-07 NOTE — Telephone Encounter (Signed)
Pt send email via mychart requesting refill.  LOV: 09/29/14 NOV: 12/30/14  RF request for Losartan/hctz Last written: unknown  RF request for omeprazole Last written: unknown  RF request for potassium Last written: unknown  Pt also requested metoprolol, on her message she stated that she takes this once a day but per our records she should be taking it twice a day. I will send back email to confirm with pt the correct sig for this medication.

## 2014-12-08 ENCOUNTER — Encounter: Payer: Self-pay | Admitting: *Deleted

## 2014-12-09 MED ORDER — METOPROLOL TARTRATE 50 MG PO TABS: 50.0000 mg | ORAL_TABLET | Freq: Two times a day (BID) | ORAL | Status: DC

## 2014-12-09 NOTE — Telephone Encounter (Signed)
Left message on home vm for pt to call back.

## 2014-12-09 NOTE — Telephone Encounter (Signed)
Pt called back and advised that she is taking two daily of the metoprolol.

## 2014-12-10 ENCOUNTER — Telehealth: Payer: Self-pay | Admitting: Family Medicine

## 2014-12-10 DIAGNOSIS — L602 Onychogryphosis: Secondary | ICD-10-CM | POA: Diagnosis not present

## 2014-12-10 DIAGNOSIS — L84 Corns and callosities: Secondary | ICD-10-CM | POA: Diagnosis not present

## 2014-12-10 DIAGNOSIS — E1151 Type 2 diabetes mellitus with diabetic peripheral angiopathy without gangrene: Secondary | ICD-10-CM | POA: Diagnosis not present

## 2014-12-10 NOTE — Telephone Encounter (Signed)
No need for her to be checking sugars at home at this time.-thx

## 2014-12-10 NOTE — Telephone Encounter (Signed)
Please advise. Thanks.  

## 2014-12-10 NOTE — Telephone Encounter (Signed)
Patient wants to know if she is supposed to be checking her blood sugars. If yes she needs an Rx for the resting strips. Please call her back.

## 2014-12-10 NOTE — Telephone Encounter (Signed)
Pt advised and voiced understanding.   

## 2014-12-11 DIAGNOSIS — M47816 Spondylosis without myelopathy or radiculopathy, lumbar region: Secondary | ICD-10-CM | POA: Diagnosis not present

## 2014-12-15 DIAGNOSIS — M25561 Pain in right knee: Secondary | ICD-10-CM | POA: Diagnosis not present

## 2014-12-16 DIAGNOSIS — R300 Dysuria: Secondary | ICD-10-CM | POA: Diagnosis not present

## 2014-12-16 DIAGNOSIS — R3989 Other symptoms and signs involving the genitourinary system: Secondary | ICD-10-CM | POA: Diagnosis not present

## 2014-12-16 DIAGNOSIS — N390 Urinary tract infection, site not specified: Secondary | ICD-10-CM | POA: Diagnosis not present

## 2014-12-25 DIAGNOSIS — M1711 Unilateral primary osteoarthritis, right knee: Secondary | ICD-10-CM | POA: Diagnosis not present

## 2014-12-28 ENCOUNTER — Other Ambulatory Visit: Payer: Self-pay | Admitting: Family Medicine

## 2014-12-28 NOTE — Telephone Encounter (Signed)
RF request for duloxetine LOV: 09/29/14 Next ov: 12/30/14 Last written: unknown

## 2014-12-30 ENCOUNTER — Ambulatory Visit (INDEPENDENT_AMBULATORY_CARE_PROVIDER_SITE_OTHER): Payer: Medicare Other | Admitting: Family Medicine

## 2014-12-30 ENCOUNTER — Encounter: Payer: Self-pay | Admitting: Family Medicine

## 2014-12-30 ENCOUNTER — Ambulatory Visit: Payer: Medicare Other | Admitting: Family Medicine

## 2014-12-30 VITALS — BP 124/75 | HR 59 | Temp 98.0°F | Resp 16 | Ht 62.0 in | Wt 180.0 lb

## 2014-12-30 DIAGNOSIS — E118 Type 2 diabetes mellitus with unspecified complications: Secondary | ICD-10-CM

## 2014-12-30 DIAGNOSIS — Z23 Encounter for immunization: Secondary | ICD-10-CM

## 2014-12-30 LAB — BASIC METABOLIC PANEL
BUN: 21 mg/dL (ref 6–23)
CALCIUM: 9.6 mg/dL (ref 8.4–10.5)
CHLORIDE: 102 meq/L (ref 96–112)
CO2: 32 meq/L (ref 19–32)
CREATININE: 0.68 mg/dL (ref 0.40–1.20)
GFR: 88.75 mL/min (ref >60.00)
GLUCOSE: 117 mg/dL — AB (ref 70–99)
Potassium: 4.4 mEq/L (ref 3.5–5.1)
Sodium: 140 mEq/L (ref 135–145)

## 2014-12-30 LAB — MICROALBUMIN / CREATININE URINE RATIO
CREATININE, U: 195.2 mg/dL
Microalb Creat Ratio: 0.8 mg/g (ref 0.0–30.0)
Microalb, Ur: 1.6 mg/dL (ref 0.0–1.9)

## 2014-12-30 LAB — HEMOGLOBIN A1C: HEMOGLOBIN A1C: 7.2 % — AB (ref 4.6–6.5)

## 2014-12-30 NOTE — Progress Notes (Signed)
OFFICE VISIT  12/30/2014   CC:  Chief Complaint  Patient presents with  . Follow-up    Pt is not fasting.     HPI:    Patient is a 81 y.o. Caucasian female who presents for 3 mo f/u DM 2, HTN, HLD. Goes to podiatrist q2-3 mo for toenail care.   Currently feels nothing but numbness in both feet. Not checking home glucoses with regularity.  Occ random check is around 140. She tries to eat a diabetic diet.  Home bp monitoring occ: normal.    Her statin was changed to lipitor since I last saw her (by Dr. Harrington Challenger). Also had blepharoplasty by a plastic surgeon since I last saw her. She is set up for diabetic eye exam 02/2015.  ROS: has had two UTI's in the last 1 mo, is currently seeing a urologist, is on cephalexin daily for 1 mo, has return visit with urol tomorrow, may have to get on vaginal estrogen cream. Hot flashes occurring lately.   Past Medical History  Diagnosis Date  . Unspecified venous (peripheral) insufficiency   . Tarsal tunnel syndrome   . Abdominal pain, right lower quadrant   . Other specified gastritis without mention of hemorrhage     hx of antral gastritis on EGD 05/2009  . Obesity, unspecified   . Liver function study, abnormal   . Menopausal syndrome   . External hemorrhoid   . Anxiety   . Interstitial cystitis   . Bursitis   . Hypothyroidism   . IBS (irritable bowel syndrome)   . Morton's neuroma   . Hypertension   . Hyperlipidemia   . History of adenomatous polyp of colon   . Dyspnea     a. 09/2011 CTA Chest: No PE, Ca2+ cors;  b. 09/2011 R&L heart Cath: Relatively nl R heart pressures, nl co/ci, nonobs cad, nl EF.  . Diverticulosis of colon     noted on colonoscopy 2008  . Lumbar degenerative disc disease     Has gotten repeated LB injections, most recently April 2016 in [place].  . Depression   . Diabetes mellitus with skin complications ([place])   . Peripheral neuropathy ([place])     per EMG 11/2012.  Intolerant of gabapentin, failed pamelor,     Past Surgical History  Procedure Laterality Date  . Abdominal hysterectomy      with BSO  . Tonsillectomy    . Foot surgery      R foot bunionectomy and arthroplasty of digits R foot.  Arthroplasty digits 3 and 4 L foot.  . Oophorectomy    . Left tarsal tunnel release      sept '11 (Dr Beola Cord)  . Colonoscopy  2002/2005, and 11/14/06    polyps 2002 but none 2005 or 2008.  Recall recommended 7-10 yrs  . Hammer toe surgery      bilateral  . Ankle surgery      bilateral  . Esophagogastroduodenoscopy  05/2009    Moderate antral gastritis    Outpatient Prescriptions Prior to Visit  Medication Sig Dispense Refill  . ALPRAZolam (XANAX) 0.25 MG tablet Take 0.25 mg by mouth at bedtime as needed for anxiety.    Marland Kitchen aspirin 81 MG tablet Take 81 mg by mouth daily.    Marland Kitchen atorvastatin (LIPITOR) 20 MG tablet Take 1 tablet (20 mg total) by mouth daily. 90 tablet 3  . calcium carbonate (TUMS - DOSED IN MG ELEMENTAL CALCIUM) 500 MG chewable tablet Chew 2 tablets by mouth daily.    Marland Kitchen  cetirizine (ZYRTEC) 10 MG tablet Take 10 mg by mouth as needed for allergies.    . DULoxetine (CYMBALTA) 60 MG capsule Take 60 mg by mouth daily.  1  . HYDROcodone-acetaminophen (NORCO/VICODIN) 5-325 MG per tablet Take 1 tablet by mouth every 6 (six) hours as needed for moderate pain.    Marland Kitchen levothyroxine (SYNTHROID, LEVOTHROID) 25 MCG tablet Take 25 mcg by mouth daily.  3  . loperamide (IMODIUM) 2 MG capsule Take 2 mg by mouth as needed for diarrhea or loose stools.    Marland Kitchen losartan-hydrochlorothiazide (HYZAAR) 50-12.5 MG per tablet Take 1 tablet by mouth daily. 90 tablet 1  . LYRICA 50 MG capsule Take 50 mg by mouth 2 (two) times daily.  5  . meloxicam (MOBIC) 15 MG tablet TAKE ONE TABLET BY MOUTH DAILY AS NEEDED FOR PAIN  3  . metFORMIN (GLUCOPHAGE) 500 MG tablet Take 500 mg by mouth 2 (two) times daily.  3  . metoprolol (LOPRESSOR) 50 MG tablet Take 1 tablet (50 mg total) by mouth 2 (two) times daily. 180 tablet 1  .  mirtazapine (REMERON) 15 MG tablet Take 15 mg by mouth at bedtime. 1 or 2 at night for pruitis    . Multiple Vitamins-Minerals (MULTIVITAMIN ADULT PO) Take 1 tablet by mouth daily.    Marland Kitchen omeprazole (PRILOSEC) 20 MG capsule Take 1 capsule (20 mg total) by mouth daily. 90 capsule 1  . oxybutynin (DITROPAN-XL) 10 MG 24 hr tablet Take 10 mg by mouth daily.  3  . potassium chloride SA (K-DUR,KLOR-CON) 20 MEQ tablet Take 1 tablet (20 mEq total) by mouth daily. 90 tablet 1  . DULoxetine (CYMBALTA) 60 MG capsule TAKE ONE CAPSULE BY MOUTH DAILY 90 capsule 3   No facility-administered medications prior to visit.    Allergies  Allergen Reactions  . Gabapentin Itching  . Sulfonamide Derivatives     REACTION: Nausea    ROS As per HPI  PE: Blood pressure 124/75, pulse 59, temperature 98 F (36.7 C), temperature source Oral, resp. rate 16, height 5\' 2"  (1.575 m), weight 180 lb (81.647 kg), SpO2 97 %. Gen: Alert, well appearing.  Patient is oriented to person, place, time, and situation. Foot exam - bilateral normal; no swelling, tenderness or skin or vascular lesions. Color and temperature is normal.  Monofilament testing shows markedly diminished sensation on both entire feet surfaces. Peripheral pulses are palpable. Toenails are normal.   LABS:  Lab Results  Component Value Date   HGBA1C 6.6* 09/04/2014     Chemistry      Component Value Date/Time   NA 137 09/03/2014 0826   K 4.4 09/03/2014 0826   CL 100 09/03/2014 0826   CO2 30 09/03/2014 0826   BUN 16 09/03/2014 0826   CREATININE 0.61 09/03/2014 0826      Component Value Date/Time   CALCIUM 9.6 09/03/2014 0826   ALKPHOS 89 09/03/2014 0826   AST 33 09/03/2014 0826   ALT 32 09/03/2014 0826   BILITOT 0.4 09/03/2014 0826     Lab Results  Component Value Date   CHOL 177 09/03/2014   HDL 39.00* 09/03/2014   LDLCALC DEL 05/02/2007   LDLDIRECT 99.0 09/03/2014   TRIG 291.0* 09/03/2014   CHOLHDL 5 09/03/2014   Lab Results   Component Value Date   TSH 2.77 09/03/2014   IMPRESSION AND PLAN:  1) Dm 2, with DPN. No sign of foot ulcer now.  Full feet exam done today. HbA1c and urine microalb/cr today.  BMET today. Continue current med, diet.  2) HTN; The current medical regimen is effective;  continue present plan and medications.  3) Hyperlipidemia: statin changed to more potent statin recently by cardiologist.  She has plans to recheck lipid panel fairly soon per pt.  4) Prev health care: flu vaccine today.  An After Visit Summary was printed and given to the patient.   FOLLOW UP: Return in about 3 months (around 04/01/2015) for routine chronic illness f/u.

## 2014-12-30 NOTE — Progress Notes (Signed)
Pre visit review using our clinic review tool, if applicable. No additional management support is needed unless otherwise documented below in the visit note. 

## 2014-12-31 ENCOUNTER — Other Ambulatory Visit: Payer: Self-pay | Admitting: *Deleted

## 2014-12-31 DIAGNOSIS — R300 Dysuria: Secondary | ICD-10-CM | POA: Diagnosis not present

## 2014-12-31 DIAGNOSIS — M47816 Spondylosis without myelopathy or radiculopathy, lumbar region: Secondary | ICD-10-CM | POA: Diagnosis not present

## 2014-12-31 DIAGNOSIS — N390 Urinary tract infection, site not specified: Secondary | ICD-10-CM | POA: Diagnosis not present

## 2014-12-31 DIAGNOSIS — M5136 Other intervertebral disc degeneration, lumbar region: Secondary | ICD-10-CM | POA: Diagnosis not present

## 2014-12-31 MED ORDER — METFORMIN HCL 1000 MG PO TABS: 1000.0000 mg | ORAL_TABLET | Freq: Two times a day (BID) | ORAL | Status: DC

## 2015-01-04 ENCOUNTER — Encounter: Payer: Self-pay | Admitting: Family Medicine

## 2015-01-18 ENCOUNTER — Other Ambulatory Visit (INDEPENDENT_AMBULATORY_CARE_PROVIDER_SITE_OTHER): Payer: Medicare Other

## 2015-01-18 ENCOUNTER — Other Ambulatory Visit: Payer: Medicare Other

## 2015-01-18 DIAGNOSIS — E785 Hyperlipidemia, unspecified: Secondary | ICD-10-CM | POA: Diagnosis not present

## 2015-01-18 LAB — LDL CHOLESTEROL, DIRECT: Direct LDL: 76 mg/dL

## 2015-01-18 LAB — LIPID PANEL
Cholesterol: 136 mg/dL (ref 0–200)
HDL: 36 mg/dL — AB (ref >39.00)
NONHDL: 100.23
TRIGLYCERIDES: 201 mg/dL — AB (ref 0.0–149.0)
Total CHOL/HDL Ratio: 4
VLDL: 40.2 mg/dL — AB (ref 0.0–40.0)

## 2015-01-18 LAB — AST: AST: 27 U/L (ref 0–37)

## 2015-01-29 ENCOUNTER — Telehealth: Payer: Self-pay | Admitting: Family Medicine

## 2015-01-29 NOTE — Telephone Encounter (Signed)
Please advise. Thanks.  

## 2015-01-29 NOTE — Telephone Encounter (Signed)
Patient is having awful hot flashes. She has taken 2 bottles of black cohosh. It hasn't helped. Can she start taking estradol again? It worked well in the past.

## 2015-02-01 ENCOUNTER — Encounter: Payer: Self-pay | Admitting: Family Medicine

## 2015-02-01 MED ORDER — ESTRADIOL 1 MG PO TABS: 1.0000 mg | ORAL_TABLET | Freq: Every day | ORAL | Status: DC

## 2015-02-01 NOTE — Telephone Encounter (Signed)
Estradiol 1mg  tabs eRx'd.

## 2015-02-02 NOTE — Telephone Encounter (Signed)
Pt advised and voiced understanding.  She stated that she has Estradiol 1mg  at home and will finish those then p/u Rx sent in.

## 2015-02-11 DIAGNOSIS — M79675 Pain in left toe(s): Secondary | ICD-10-CM | POA: Diagnosis not present

## 2015-02-11 DIAGNOSIS — L03032 Cellulitis of left toe: Secondary | ICD-10-CM | POA: Diagnosis not present

## 2015-02-26 DIAGNOSIS — L602 Onychogryphosis: Secondary | ICD-10-CM | POA: Diagnosis not present

## 2015-02-26 DIAGNOSIS — L03031 Cellulitis of right toe: Secondary | ICD-10-CM | POA: Diagnosis not present

## 2015-02-26 DIAGNOSIS — E1351 Other specified diabetes mellitus with diabetic peripheral angiopathy without gangrene: Secondary | ICD-10-CM | POA: Diagnosis not present

## 2015-02-26 DIAGNOSIS — L03032 Cellulitis of left toe: Secondary | ICD-10-CM | POA: Diagnosis not present

## 2015-03-03 ENCOUNTER — Other Ambulatory Visit: Payer: Self-pay | Admitting: *Deleted

## 2015-03-03 MED ORDER — METFORMIN HCL 1000 MG PO TABS: 1000.0000 mg | ORAL_TABLET | Freq: Two times a day (BID) | ORAL | Status: DC

## 2015-03-03 NOTE — Telephone Encounter (Signed)
Fax from [employer] requesting 90 days supply.   RF request for metformin LOV: 12/30/14 Next ov: 04/01/15 Last written: 12/31/14 #60 w/ 6RF

## 2015-03-15 ENCOUNTER — Other Ambulatory Visit: Payer: Self-pay | Admitting: Family Medicine

## 2015-03-15 NOTE — Telephone Encounter (Signed)
LOV: 12/30/14 NOV: 04/01/15  RF request for omeprazole Last written: 12/07/14 #90 w/ 1RF  RF request for losartan/hctz Last written: 12/07/14 #90 w/ 1RF  RF request for metoprolol Last written: 12/07/14 #180 w 1RF  RF request for potassium Last written: 12/07/14 #90 w/ 1RF

## 2015-03-19 DIAGNOSIS — H2513 Age-related nuclear cataract, bilateral: Secondary | ICD-10-CM | POA: Diagnosis not present

## 2015-03-19 DIAGNOSIS — H04123 Dry eye syndrome of bilateral lacrimal glands: Secondary | ICD-10-CM | POA: Diagnosis not present

## 2015-03-31 DIAGNOSIS — N3010 Interstitial cystitis (chronic) without hematuria: Secondary | ICD-10-CM | POA: Diagnosis not present

## 2015-03-31 DIAGNOSIS — Z0000 Encounter for general adult medical examination without abnormal findings: Secondary | ICD-10-CM | POA: Diagnosis not present

## 2015-03-31 DIAGNOSIS — N390 Urinary tract infection, site not specified: Secondary | ICD-10-CM | POA: Diagnosis not present

## 2015-04-01 ENCOUNTER — Ambulatory Visit (INDEPENDENT_AMBULATORY_CARE_PROVIDER_SITE_OTHER): Payer: Medicare Other | Admitting: Family Medicine

## 2015-04-01 ENCOUNTER — Encounter: Payer: Self-pay | Admitting: Family Medicine

## 2015-04-01 VITALS — BP 119/64 | HR 65 | Temp 97.5°F | Resp 16 | Ht 62.0 in | Wt 171.5 lb

## 2015-04-01 DIAGNOSIS — I10 Essential (primary) hypertension: Secondary | ICD-10-CM | POA: Diagnosis not present

## 2015-04-01 DIAGNOSIS — E118 Type 2 diabetes mellitus with unspecified complications: Secondary | ICD-10-CM

## 2015-04-01 DIAGNOSIS — E785 Hyperlipidemia, unspecified: Secondary | ICD-10-CM

## 2015-04-01 NOTE — Progress Notes (Signed)
OFFICE VISIT  04/01/2015   CC:  Chief Complaint  Patient presents with  . Follow-up    Pt is fasting.    HPI:    Patient is a 82 y.o. Caucasian female who presents for 3 mo f/u DM 2, HTN, HLD. No acute complaints.  Home glucoses: 106 fasting yesterday.  No checks later in the day.  Blood pressure check at home: occasionally "when it feels high".  Sometimes 0000000 systolic.   She relaxes and rechecks bp and it is always 120s/80s on recheck.  She feels like the atorva is causing a bad taste in mouth and decreased appetite.  She tried stopping it for 3 d and these sx's went away.  She restarted the med and sx's are returning.  She will ask her heart MD about switching back to previous statin.  ROS: no fevers, no HA's, no dizziness, no CP, no SOB.  Past Medical History  Diagnosis Date  . Unspecified venous (peripheral) insufficiency   . Tarsal tunnel syndrome   . Abdominal pain, right lower quadrant   . Other specified gastritis without mention of hemorrhage     hx of antral gastritis on EGD 05/2009  . Obesity, unspecified   . Liver function study, abnormal   . Menopausal syndrome   . External hemorrhoid   . Anxiety   . Interstitial cystitis   . Bursitis   . Hypothyroidism   . IBS (irritable bowel syndrome)   . Morton's neuroma   . Hypertension   . Hyperlipidemia   . History of adenomatous polyp of colon   . Dyspnea     a. 09/2011 CTA Chest: No PE, Ca2+ cors;  b. 09/2011 R&L heart Cath: Relatively nl R heart pressures, nl co/ci, nonobs cad, nl EF.  . Diverticulosis of colon     noted on colonoscopy 2008  . Lumbar degenerative disc disease     Facet-mediated pain per Dr. Nelva Bush.  Has gotten repeated LB injections, most recently April 2016 in [place].  . Depression   . Diabetes mellitus with skin complications ([place])   . Peripheral neuropathy ([place])     per EMG 11/2012.  Intolerant of gabapentin, failed pamelor.  Radiofrequency neurotomy 12/11/14 helped LB pain (Dr. Nelva Bush)  .  Former smoker     30 pack-yrs, quit 2005  . Hot flashes     restarted estradiol 01/2015  . RLS (restless legs syndrome)   . Cataracts, both eyes     soon to have cataract surg as of 03/2015  . Recurrent UTI     on daily antibiotic prophylaxis by her urologist.    Past Surgical History  Procedure Laterality Date  . Total abdominal hysterectomy w/ bilateral salpingoophorectomy    . Tonsillectomy    . Foot surgery      R foot bunionectomy and arthroplasty of digits R foot.  Arthroplasty digits 3 and 4 L foot.  . Left tarsal tunnel release      sept '11 (Dr Beola Cord)  . Colonoscopy  2002/2005, 11/14/06, and 05/2009    polyps 2002 but none 2005 or 2008 or 2011.  Recall recommended 7-10 yrs  . Hammer toe surgery      bilateral  . Ankle surgery      bilateral  . Esophagogastroduodenoscopy  05/2009    Moderate antral gastritis  . Cardiac catheterization  09/2011    Nonobstructive CAD  . Transthoracic echocardiogram  08/2011    Grade I DD    Outpatient Prescriptions Prior to Visit  Medication Sig Dispense Refill  . ALPRAZolam (XANAX) 0.25 MG tablet Take 0.25 mg by mouth at bedtime as needed for anxiety.    Marland Kitchen aspirin 81 MG tablet Take 81 mg by mouth daily.    Marland Kitchen atorvastatin (LIPITOR) 20 MG tablet Take 1 tablet (20 mg total) by mouth daily. 90 tablet 3  . calcium carbonate (TUMS - DOSED IN MG ELEMENTAL CALCIUM) 500 MG chewable tablet Chew 2 tablets by mouth daily.    . cetirizine (ZYRTEC) 10 MG tablet Take 10 mg by mouth as needed for allergies.    . DULoxetine (CYMBALTA) 60 MG capsule Take 60 mg by mouth daily.  1  . estradiol (ESTRACE) 1 MG tablet Take 1 tablet (1 mg total) by mouth daily. 30 tablet 6  . HYDROcodone-acetaminophen (NORCO/VICODIN) 5-325 MG per tablet Take 1 tablet by mouth every 6 (six) hours as needed for moderate pain.    Marland Kitchen levothyroxine (SYNTHROID, LEVOTHROID) 25 MCG tablet Take 25 mcg by mouth daily.  3  . loperamide (IMODIUM) 2 MG capsule Take 2 mg by mouth as needed  for diarrhea or loose stools.    Marland Kitchen losartan-hydrochlorothiazide (HYZAAR) 50-12.5 MG tablet TAKE ONE TABLET BY MOUTH DAILY 90 tablet 3  . LYRICA 50 MG capsule Take 50 mg by mouth 2 (two) times daily.  5  . meloxicam (MOBIC) 15 MG tablet TAKE ONE TABLET BY MOUTH DAILY AS NEEDED FOR PAIN  3  . metFORMIN (GLUCOPHAGE) 1000 MG tablet Take 1 tablet (1,000 mg total) by mouth 2 (two) times daily. 180 tablet 1  . metoprolol (LOPRESSOR) 50 MG tablet Take 1 tablet (50 mg total) by mouth 2 (two) times daily. 180 tablet 3  . mirtazapine (REMERON) 15 MG tablet Take 15 mg by mouth at bedtime. 1 or 2 at night for pruitis    . omeprazole (PRILOSEC) 20 MG capsule TAKE ONE CAPSULE BY MOUTH DAILY 90 capsule 3  . oxybutynin (DITROPAN-XL) 10 MG 24 hr tablet Take 10 mg by mouth daily.  3  . potassium chloride SA (K-DUR,KLOR-CON) 20 MEQ tablet TAKE ONE TABLET BY MOUTH DAILY 90 tablet 3  . cephALEXin (KEFLEX) 250 MG capsule Take 250 mg by mouth at bedtime. Reported on 04/01/2015  6  . Multiple Vitamins-Minerals (MULTIVITAMIN ADULT PO) Take 1 tablet by mouth daily. Reported on 04/01/2015     No facility-administered medications prior to visit.    Allergies  Allergen Reactions  . Gabapentin Itching  . Sulfonamide Derivatives     REACTION: Nausea    ROS As per HPI  PE: Blood pressure 119/64, pulse 65, temperature 97.5 F (36.4 C), temperature source Oral, resp. rate 16, height 5\' 2"  (1.575 m), weight 171 lb 8 oz (77.792 kg), SpO2 97 %. Gen: Alert, well appearing.  Patient is oriented to person, place, time, and situation. AFFECT: pleasant, lucid thought and speech. No further exam today.  LABS:  Lab Results  Component Value Date   HGBA1C 7.2* 12/30/2014     Chemistry      Component Value Date/Time   NA 140 12/30/2014 1011   K 4.4 12/30/2014 1011   CL 102 12/30/2014 1011   CO2 32 12/30/2014 1011   BUN 21 12/30/2014 1011   CREATININE 0.68 12/30/2014 1011      Component Value Date/Time   CALCIUM 9.6  12/30/2014 1011   ALKPHOS 89 09/03/2014 0826   AST 27 01/18/2015 0826   ALT 32 09/03/2014 0826   BILITOT 0.4 09/03/2014 0826  Lab Results  Component Value Date   CHOL 136 01/18/2015   HDL 36.00* 01/18/2015   LDLCALC DEL 05/02/2007   LDLDIRECT 76.0 01/18/2015   TRIG 201.0* 01/18/2015   CHOLHDL 4 01/18/2015   Lab Results  Component Value Date   TSH 2.77 09/03/2014   POCT HbA1c today was 5.9%  IMPRESSION AND PLAN:  1) DM 2, well controlled. The current medical regimen is effective;  continue present plan and medications.  2) HTN; The current medical regimen is effective;  continue present plan and medications.  3) Hyperlipidemia; pt to ask her cardiologist about possible switch back to previous statin b/c of GI side effects from atorvastatin.  For now she'll continue atorv.  An After Visit Summary was printed and given to the patient.  FOLLOW UP: Return in about 3 months (around 06/30/2015) for medicare AWV.

## 2015-04-01 NOTE — Progress Notes (Signed)
Pre visit review using our clinic review tool, if applicable. No additional management support is needed unless otherwise documented below in the visit note. 

## 2015-04-05 ENCOUNTER — Other Ambulatory Visit: Payer: Self-pay | Admitting: Family Medicine

## 2015-04-05 NOTE — Telephone Encounter (Signed)
RF request for Lyrica LOV: 04/01/15 Next ov: 07/02/15 Last written: unknown  Please advise. Thanks.

## 2015-04-05 NOTE — Telephone Encounter (Signed)
Rx faxed

## 2015-04-12 DIAGNOSIS — H0279 Other degenerative disorders of eyelid and periocular area: Secondary | ICD-10-CM | POA: Diagnosis not present

## 2015-04-12 DIAGNOSIS — H04123 Dry eye syndrome of bilateral lacrimal glands: Secondary | ICD-10-CM | POA: Diagnosis not present

## 2015-04-12 DIAGNOSIS — H02135 Senile ectropion of left lower eyelid: Secondary | ICD-10-CM | POA: Diagnosis not present

## 2015-04-14 DIAGNOSIS — S90112A Contusion of left great toe without damage to nail, initial encounter: Secondary | ICD-10-CM | POA: Diagnosis not present

## 2015-04-14 DIAGNOSIS — L97529 Non-pressure chronic ulcer of other part of left foot with unspecified severity: Secondary | ICD-10-CM | POA: Diagnosis not present

## 2015-04-14 DIAGNOSIS — E1151 Type 2 diabetes mellitus with diabetic peripheral angiopathy without gangrene: Secondary | ICD-10-CM | POA: Diagnosis not present

## 2015-04-20 DIAGNOSIS — H18413 Arcus senilis, bilateral: Secondary | ICD-10-CM | POA: Diagnosis not present

## 2015-04-20 DIAGNOSIS — H2511 Age-related nuclear cataract, right eye: Secondary | ICD-10-CM | POA: Diagnosis not present

## 2015-04-20 DIAGNOSIS — H2513 Age-related nuclear cataract, bilateral: Secondary | ICD-10-CM | POA: Diagnosis not present

## 2015-04-20 DIAGNOSIS — H353130 Nonexudative age-related macular degeneration, bilateral, stage unspecified: Secondary | ICD-10-CM | POA: Diagnosis not present

## 2015-04-25 ENCOUNTER — Encounter: Payer: Self-pay | Admitting: Family Medicine

## 2015-04-26 MED ORDER — MIRTAZAPINE 15 MG PO TABS: ORAL_TABLET | ORAL | Status: DC

## 2015-04-26 NOTE — Telephone Encounter (Signed)
RF request for mirtazepine LOV: 04/01/15 Next ov: 07/02/15 Last written: 05/24/12 #180 w/ 1RF

## 2015-04-30 DIAGNOSIS — E1151 Type 2 diabetes mellitus with diabetic peripheral angiopathy without gangrene: Secondary | ICD-10-CM | POA: Diagnosis not present

## 2015-04-30 DIAGNOSIS — L97529 Non-pressure chronic ulcer of other part of left foot with unspecified severity: Secondary | ICD-10-CM | POA: Diagnosis not present

## 2015-05-10 ENCOUNTER — Ambulatory Visit (INDEPENDENT_AMBULATORY_CARE_PROVIDER_SITE_OTHER): Payer: Medicare Other | Admitting: Family Medicine

## 2015-05-10 ENCOUNTER — Encounter: Payer: Self-pay | Admitting: Family Medicine

## 2015-05-10 DIAGNOSIS — R197 Diarrhea, unspecified: Secondary | ICD-10-CM

## 2015-05-10 MED ORDER — ALIGN 4 MG PO CAPS: 4.0000 mg | ORAL_CAPSULE | Freq: Every day | ORAL | Status: DC

## 2015-05-10 NOTE — Patient Instructions (Addendum)
Probiotics WHAT ARE PROBIOTICS? Probiotics are the good bacteria and yeasts that live in your body and keep you and your digestive system healthy. Probiotics also help your body's defense (immune) system and protect your body against bad bacterial growth.  Certain foods contain probiotics, such as yogurt. Probiotics can also be purchased as a supplement. As with any supplement or drug, it is important to discuss its use with your health care provider.  WHAT AFFECTS THE BALANCE OF BACTERIA IN MY BODY? The balance of bacteria in your body can be affected by:   Antibiotic medicines. Antibiotics are sometimes necessary to treat infection. Unfortunately, they may kill good or friendly bacteria in your body as well as the bad bacteria. This may lead to stomach problems like diarrhea, gas, and cramping.  Disease. Some conditions are the result of an overgrowth of bad bacteria, yeasts, parasites, or fungi. These conditions include:   Infectious diarrhea.  Stomach and respiratory infections.  Skin infections.  Irritable bowel syndrome (IBS).  Inflammatory bowel diseases.  Ulcer due to Helicobacter pylori (H. pylori) infection.  Tooth decay and periodontal disease.  Vaginal infections. Stress and poor diet may also lower the good bacteria in your body.  WHAT TYPE OF PROBIOTIC IS RIGHT FOR ME? Probiotics are available over the counter at your local pharmacy, health food, or grocery store. They come in many different forms, combinations of strains, and dosing strengths. Some may need to be refrigerated. Always read the label for storage and usage instructions. Specific strains have been shown to be more effective for certain conditions. Ask your health care provider what option is best for you.  WHY WOULD I NEED PROBIOTICS? There are many reasons your health care provider might recommend a probiotic supplement, including:   Diarrhea.  Constipation.  IBS.  Respiratory infections.  Yeast  infections.  Acne, eczema, and other skin conditions.  Frequent urinary tract infections (UTIs). ARE THERE SIDE EFFECTS OF PROBIOTICS? Some people experience mild side effects when taking probiotics. Side effects are usually temporary and may include:   Gas.  Bloating.  Cramping. Rarely, serious side effects, such as infection or immune system changes, may occur. WHAT ELSE DO I NEED TO KNOW ABOUT PROBIOTICS?   There are many different strains of probiotics. Certain strains may be more effective depending on your condition. Probiotics are available in varying doses. Ask your health care provider which probiotic you should use and how often.   If you are taking probiotics along with antibiotics, it is generally recommended to wait at least 2 hours between taking the antibiotic and taking the probiotic.  FOR MORE INFORMATION:  [facility] [email or web address]   This information is not intended to replace advice given to you by your health care provider. Make sure you discuss any questions you have with your health care provider.   Document Released: 10/08/2013 Document Reviewed: 10/08/2013 Elsevier Interactive Patient Education [employer].   I have called in a probiotic to help to encourage the good bacteria to grow in the intestine. Eat plenty of cottage/yougurt to help as well.  Take imodium as directed as well.  If urine becomes darker, you develop a fever, or you can tolerate food or liquid the you need to be seen immediately.

## 2015-05-10 NOTE — Progress Notes (Signed)
Patient ID: YOVONDA SOJA, female   DOB: Apr 01, 1933, 82 y.o.   MRN: [id number]    Twain , 1934-03-19, 82 y.o., female MRN: [id number]  CC: Diarrhea Subjective: Pt presents for an acute OV with complaints of diarrhea since Thursday morning  (5 days ago). Pt states she was on abx for dental implants, which was completed last Monday. She was placed on Augmentin and experienced diarrhea on Thursday. Patient has IBS, but feels this is different. She endorses abdominal pain in her lower abdomen. Pt denies fevers, occasional chills, no nausea or vomit. . She is eating and drinking well. Her last stool was 1:30 pm, and is "mushy" not watery. No more than normal foul smell. Her last dose of abx was Thursday. She is also on metformin, however dose has been unchanged.  Pt tried imodium x1 on Thursday, but it made her felt like she could not produce a stool with it. Patient states she is urinating her normal and it is light yellow.   Allergies  Allergen Reactions  . Gabapentin Itching  . Sulfonamide Derivatives     REACTION: Nausea   Social History  Substance Use Topics  . Smoking status: Former Smoker -- 1.00 packs/day for 35 years    Types: Cigarettes    Quit date: 03/27/2002  . Smokeless tobacco: Never Used  . Alcohol Use: No   Past Medical History  Diagnosis Date  . Unspecified venous (peripheral) insufficiency   . Tarsal tunnel syndrome   . Abdominal pain, right lower quadrant   . Other specified gastritis without mention of hemorrhage     hx of antral gastritis on EGD 05/2009  . Obesity, unspecified   . Liver function study, abnormal   . Menopausal syndrome   . External hemorrhoid   . Anxiety   . Interstitial cystitis   . Bursitis   . Hypothyroidism   . IBS (irritable bowel syndrome)   . Morton's neuroma   . Hypertension   . Hyperlipidemia   . History of adenomatous polyp of colon   . Dyspnea     a. 09/2011 CTA Chest: No PE, Ca2+ cors;  b. 09/2011 R&L heart Cath:  Relatively nl R heart pressures, nl co/ci, nonobs cad, nl EF.  . Diverticulosis of colon     noted on colonoscopy 2008  . Lumbar degenerative disc disease     Facet-mediated pain per Dr. Nelva Bush.  Has gotten repeated LB injections, most recently April 2016 in [place].  . Depression   . 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 Peripheral neuropathy ([place])     per EMG 11/2012.  Intolerant of gabapentin, failed pamelor.  Radiofrequency neurotomy 12/11/14 helped LB pain (Dr. Nelva Bush)  . Former smoker     30 pack-yrs, quit 2005  . Hot flashes     restarted estradiol 01/2015  . RLS (restless legs syndrome)   . Cataracts, both eyes     soon to have cataract surg as of 03/2015  . Recurrent UTI     on daily antibiotic prophylaxis by her urologist.   Past Surgical History  Procedure Laterality Date  . Total abdominal hysterectomy w/ bilateral salpingoophorectomy    . Tonsillectomy    . Foot surgery      R foot bunionectomy and arthroplasty of digits R foot.  Arthroplasty digits 3 and 4 L foot.  . Left tarsal tunnel release      sept '11 (Dr Beola Cord)  . Colonoscopy  2002/2005, 11/14/06, and 05/2009  polyps 2002 but none 2005 or 2008 or 2011.  Recall recommended 7-10 yrs  . Hammer toe surgery      bilateral  . Ankle surgery      bilateral  . Esophagogastroduodenoscopy  05/2009    Moderate antral gastritis  . Cardiac catheterization  09/2011    Nonobstructive CAD  . Transthoracic echocardiogram  08/2011    Grade I DD   Family History  Problem Relation Age of Onset  . Cancer Mother     Breast  . Hypertension Father   . Hyperlipidemia Father   . Colon cancer    . Diabetes Maternal Grandfather      Medication List       This list is accurate as of: 05/10/15  3:35 PM.  Always use your most recent med list.               ALPRAZolam 0.25 MG tablet  Commonly known as:  XANAX  Take 0.25 mg by mouth at bedtime as needed for anxiety.     aspirin 81 MG tablet  Take 81 mg by mouth  daily.     atorvastatin 20 MG tablet  Commonly known as:  LIPITOR  Take 1 tablet (20 mg total) by mouth daily.     BESIVANCE 0.6 % Susp  Generic drug:  Besifloxacin HCl  Reported on 05/10/2015     calcium carbonate 500 MG chewable tablet  Commonly known as:  TUMS - dosed in mg elemental calcium  Chew 2 tablets by mouth daily.     cetirizine 10 MG tablet  Commonly known as:  ZYRTEC  Take 10 mg by mouth as needed for allergies.     DULoxetine 60 MG capsule  Commonly known as:  CYMBALTA  Take 60 mg by mouth daily.     DUREZOL 0.05 % Emul  Generic drug:  Difluprednate  Reported on 05/10/2015     estradiol 1 MG tablet  Commonly known as:  ESTRACE  Take 1 tablet (1 mg total) by mouth daily.     HYDROcodone-acetaminophen 5-325 MG tablet  Commonly known as:  NORCO/VICODIN  Take 1 tablet by mouth every 6 (six) hours as needed for moderate pain.     ILEVRO 0.3 % ophthalmic suspension  Generic drug:  nepafenac  Reported on 05/10/2015     levothyroxine 25 MCG tablet  Commonly known as:  SYNTHROID, LEVOTHROID  Take 25 mcg by mouth daily.     loperamide 2 MG capsule  Commonly known as:  IMODIUM  Take 2 mg by mouth as needed for diarrhea or loose stools. Reported on 05/10/2015     losartan-hydrochlorothiazide 50-12.5 MG tablet  Commonly known as:  HYZAAR  TAKE ONE TABLET BY MOUTH DAILY     LYRICA 50 MG capsule  Generic drug:  pregabalin  TAKE ONE CAPSULE BY MOUTH TWICE DAILY     meloxicam 15 MG tablet  Commonly known as:  MOBIC  TAKE ONE TABLET BY MOUTH DAILY AS NEEDED FOR PAIN     metFORMIN 1000 MG tablet  Commonly known as:  GLUCOPHAGE  Take 1 tablet (1,000 mg total) by mouth 2 (two) times daily.     metoprolol 50 MG tablet  Commonly known as:  LOPRESSOR  Take 1 tablet (50 mg total) by mouth 2 (two) times daily.     mirtazapine 15 MG tablet  Commonly known as:  REMERON  1 or 2 tablets at night for pruitis     omeprazole 20 MG capsule  Commonly known as:  PRILOSEC   TAKE ONE CAPSULE BY MOUTH DAILY     oxybutynin 10 MG 24 hr tablet  Commonly known as:  DITROPAN-XL  Take 10 mg by mouth daily.     potassium chloride SA 20 MEQ tablet  Commonly known as:  K-DUR,KLOR-CON  TAKE ONE TABLET BY MOUTH DAILY       ROS: Negative, with the exception of above mentioned in HPI  Objective:  BP 124/76 mmHg  Pulse 97  Temp(Src) 97.9 F (36.6 C)  Resp 20  Wt 169 lb (76.658 kg)  SpO2 95% Body mass index is 30.9 kg/(m^2). Gen: Afebrile. No acute distress. Nontoxic in appearance. Well developed well nourished female.  HENT: AT. [place]. MMM, no oral lesions.  Eyes:Pupils Equal Round Reactive to light, Extraocular movements intact,  Conjunctiva without redness, discharge or icterus. CV: RRR Chest: CTAB, no wheeze or crackles. Good air movement, normal resp effort.  Abd: Soft. obese. NTND. BS present. No Masses palpated. No rebound or guarding.   Neuro: Normal gait. PERLA. EOMi. Alert. Oriented x3   Assessment/Plan: ORALEE KAN is a 82 y.o. female present for acute OV for  1. Diarrhea, unspecified type - likely from antibiotic use, no alarm symptoms on exam.  - rest, hydrate, align use, immodium as directed.  - Discussed caution signs and when to return to OV (fever, worsening pain, dehydration symptoms, diarrhea not resolving) - Probiotic Product (ALIGN) 4 MG CAPS; Take 1 capsule (4 mg total) by mouth daily.  Dispense: 30 capsule; Refill: 2 - Yogurt, cottage chesse - F/U PRN  Howard Pouch, DO  - OR

## 2015-05-13 DIAGNOSIS — E1151 Type 2 diabetes mellitus with diabetic peripheral angiopathy without gangrene: Secondary | ICD-10-CM | POA: Diagnosis not present

## 2015-05-13 DIAGNOSIS — L97529 Non-pressure chronic ulcer of other part of left foot with unspecified severity: Secondary | ICD-10-CM | POA: Diagnosis not present

## 2015-05-27 DIAGNOSIS — E1151 Type 2 diabetes mellitus with diabetic peripheral angiopathy without gangrene: Secondary | ICD-10-CM | POA: Diagnosis not present

## 2015-05-27 DIAGNOSIS — L97529 Non-pressure chronic ulcer of other part of left foot with unspecified severity: Secondary | ICD-10-CM | POA: Diagnosis not present

## 2015-05-31 DIAGNOSIS — H2511 Age-related nuclear cataract, right eye: Secondary | ICD-10-CM | POA: Diagnosis not present

## 2015-05-31 DIAGNOSIS — H25811 Combined forms of age-related cataract, right eye: Secondary | ICD-10-CM | POA: Diagnosis not present

## 2015-06-01 ENCOUNTER — Other Ambulatory Visit: Payer: Self-pay | Admitting: Family Medicine

## 2015-06-01 DIAGNOSIS — H2512 Age-related nuclear cataract, left eye: Secondary | ICD-10-CM | POA: Diagnosis not present

## 2015-06-14 DIAGNOSIS — H11433 Conjunctival hyperemia, bilateral: Secondary | ICD-10-CM | POA: Diagnosis not present

## 2015-06-14 DIAGNOSIS — L910 Hypertrophic scar: Secondary | ICD-10-CM | POA: Diagnosis not present

## 2015-06-14 DIAGNOSIS — H02132 Senile ectropion of right lower eyelid: Secondary | ICD-10-CM | POA: Diagnosis not present

## 2015-06-14 DIAGNOSIS — H02135 Senile ectropion of left lower eyelid: Secondary | ICD-10-CM | POA: Diagnosis not present

## 2015-06-18 DIAGNOSIS — M47816 Spondylosis without myelopathy or radiculopathy, lumbar region: Secondary | ICD-10-CM | POA: Diagnosis not present

## 2015-06-18 HISTORY — PX: OTHER SURGICAL HISTORY: SHX169

## 2015-06-21 DIAGNOSIS — H25812 Combined forms of age-related cataract, left eye: Secondary | ICD-10-CM | POA: Diagnosis not present

## 2015-06-21 DIAGNOSIS — H2512 Age-related nuclear cataract, left eye: Secondary | ICD-10-CM | POA: Diagnosis not present

## 2015-07-02 ENCOUNTER — Ambulatory Visit (INDEPENDENT_AMBULATORY_CARE_PROVIDER_SITE_OTHER): Payer: Medicare Other | Admitting: Family Medicine

## 2015-07-02 ENCOUNTER — Encounter: Payer: Self-pay | Admitting: Family Medicine

## 2015-07-02 VITALS — BP 137/92 | HR 70 | Temp 97.8°F | Resp 16 | Ht 61.5 in | Wt 168.2 lb

## 2015-07-02 DIAGNOSIS — F17210 Nicotine dependence, cigarettes, uncomplicated: Secondary | ICD-10-CM | POA: Diagnosis not present

## 2015-07-02 DIAGNOSIS — I10 Essential (primary) hypertension: Secondary | ICD-10-CM

## 2015-07-02 DIAGNOSIS — L988 Other specified disorders of the skin and subcutaneous tissue: Secondary | ICD-10-CM

## 2015-07-02 DIAGNOSIS — E039 Hypothyroidism, unspecified: Secondary | ICD-10-CM

## 2015-07-02 DIAGNOSIS — E11628 Type 2 diabetes mellitus with other skin complications: Secondary | ICD-10-CM

## 2015-07-02 LAB — COMPREHENSIVE METABOLIC PANEL
ALK PHOS: 81 U/L (ref 39–117)
ALT: 18 U/L (ref 0–35)
AST: 18 U/L (ref 0–37)
Albumin: 4.1 g/dL (ref 3.5–5.2)
BUN: 18 mg/dL (ref 6–23)
CHLORIDE: 101 meq/L (ref 96–112)
CO2: 28 mEq/L (ref 19–32)
Calcium: 9.5 mg/dL (ref 8.4–10.5)
Creatinine, Ser: 0.68 mg/dL (ref 0.40–1.20)
GFR: 88.63 mL/min (ref >60.00)
GLUCOSE: 108 mg/dL — AB (ref 70–99)
POTASSIUM: 4.1 meq/L (ref 3.5–5.1)
SODIUM: 139 meq/L (ref 135–145)
TOTAL PROTEIN: 6.4 g/dL (ref 6.0–8.3)
Total Bilirubin: 0.4 mg/dL (ref 0.2–1.2)

## 2015-07-02 LAB — TSH: TSH: 1.69 u[IU]/mL (ref 0.35–4.50)

## 2015-07-02 LAB — HEMOGLOBIN A1C: Hgb A1c MFr Bld: 6.3 % (ref 4.6–6.5)

## 2015-07-02 NOTE — Progress Notes (Signed)
Pre visit review using our clinic review tool, if applicable. No additional management support is needed unless otherwise documented below in the visit note. 

## 2015-07-02 NOTE — Progress Notes (Signed)
The patient is here for annual Medicare wellness examination and management of other chronic and acute problems. Other problems discussed today: DM 2: due for A1c. Hypothyroidism: due for TSH.   HTN/HLD: due for CMET. She is fasting.   AWV DATA The risk factors are reflected in the social history.  The roster of all physicians providing medical care to patient is listed in the Snapshot section of the chart.  Activities of daily living:  The patient is 100% independent in all ADLs: dressing, toileting, feeding as well as independent mobility.  Home safety : The patient has smoke detectors in the home. They wear seatbelts. No firearms at home ( firearms are present in the home, kept in a safe fashion). There is no violence in the home.   There is no risks for hepatitis, STDs or HIV. There is no history of blood transfusion. They have no travel history to infectious disease endemic areas of the world.  The patient has seen their dentist in the last six months: getting implants put in. They have seen their eye doctor in the last year. They deny any hearing difficulty and have not had audiologic testing in the last year.  They do not  have excessive sun exposure. Discussed the need for sun protection: hats, long sleeves and use of sunscreen if there is significant sun exposure.   Diet: the importance of a healthy diet is discussed. They do have a healthy fairly healthy diet.  The patient does not have a regular exercise program.  The benefits of regular aerobic exercise were discussed.  Depression screen: there are no signs or vegative symptoms of depression- irritability, change in appetite, anhedonia, sadness/tearfullness.  Cognitive assessment: the patient manages all their financial and personal affairs and is actively engaged. They could relate day,date,year and events; recalled 3/3 objects at 3 minutes; performed clock-face test normally.  Reviewed advance directives with pt today.  The  following portions of the patient's history were reviewed and updated as appropriate: allergies, current medications, past family history, past medical history,  past surgical history, past social history  and problem list.  Vision, hearing, body mass index were assessed and reviewed. Body mass index is 31.28 kg/(m^2).   During the course of the visit the patient was educated and counseled about appropriate screening and preventive services including :  Annual wellness visit --doing this today. diabetes screening--N/A. colorectal cancer screening: she declines any further colon cancer screening. recommended immunizations (influenza, pneumococcal, Hep B): Pt has had all appropriate. Bone mass measurement: pt declines any further bone density measurements.   Counseling to prevent tobacco use--N/A Depression screening: done today. Glaucoma screening: done via her eye MD. Hepatitis C virus screening: pt declines HIV virus screening: pt declines. Lung cancer screening: pt has 30 pack-yr hx smoking, quit approx 10 yrs ago.  She desires low dose CT for screening.  Unfortunately the age cufoff for this screening test is now 64 yrs, so pt is too old to get this screening test. Medical nutrition therapy: pt declines. Prostate cancer screening: N/A Screening mammography: due July 2017 Screening pap tests, pelvic exam, and clinical breast exam: pt has had hysterectomy for non-malignant reason. Ultrasound screening for AAA: N/A  A written plan of action regarding the above screening and preventative services was given to the patient today.  Drew labs today for her chronic illness f/u but we did not discuss these problems: TSH, HbA1c, and CMET.  An After Visit Summary was printed and given to the patient.  Signed:  Crissie Sickles, MD           07/02/2015

## 2015-07-06 DIAGNOSIS — I70293 Other atherosclerosis of native arteries of extremities, bilateral legs: Secondary | ICD-10-CM | POA: Diagnosis not present

## 2015-07-06 DIAGNOSIS — L602 Onychogryphosis: Secondary | ICD-10-CM | POA: Diagnosis not present

## 2015-07-06 DIAGNOSIS — M2042 Other hammer toe(s) (acquired), left foot: Secondary | ICD-10-CM | POA: Diagnosis not present

## 2015-07-06 DIAGNOSIS — L84 Corns and callosities: Secondary | ICD-10-CM | POA: Diagnosis not present

## 2015-07-06 DIAGNOSIS — E1351 Other specified diabetes mellitus with diabetic peripheral angiopathy without gangrene: Secondary | ICD-10-CM | POA: Diagnosis not present

## 2015-07-27 DIAGNOSIS — L570 Actinic keratosis: Secondary | ICD-10-CM | POA: Diagnosis not present

## 2015-07-27 DIAGNOSIS — D239 Other benign neoplasm of skin, unspecified: Secondary | ICD-10-CM | POA: Diagnosis not present

## 2015-07-27 DIAGNOSIS — L821 Other seborrheic keratosis: Secondary | ICD-10-CM | POA: Diagnosis not present

## 2015-08-04 DIAGNOSIS — M546 Pain in thoracic spine: Secondary | ICD-10-CM | POA: Diagnosis not present

## 2015-08-04 DIAGNOSIS — M5136 Other intervertebral disc degeneration, lumbar region: Secondary | ICD-10-CM | POA: Diagnosis not present

## 2015-08-12 ENCOUNTER — Encounter: Payer: Self-pay | Admitting: Family Medicine

## 2015-08-24 DIAGNOSIS — L570 Actinic keratosis: Secondary | ICD-10-CM | POA: Diagnosis not present

## 2015-08-25 DIAGNOSIS — M2042 Other hammer toe(s) (acquired), left foot: Secondary | ICD-10-CM | POA: Diagnosis not present

## 2015-08-25 DIAGNOSIS — Z01818 Encounter for other preprocedural examination: Secondary | ICD-10-CM | POA: Diagnosis not present

## 2015-08-25 DIAGNOSIS — E1351 Other specified diabetes mellitus with diabetic peripheral angiopathy without gangrene: Secondary | ICD-10-CM | POA: Diagnosis not present

## 2015-08-27 DIAGNOSIS — M1711 Unilateral primary osteoarthritis, right knee: Secondary | ICD-10-CM | POA: Diagnosis not present

## 2015-08-30 ENCOUNTER — Other Ambulatory Visit: Payer: Self-pay | Admitting: Internal Medicine

## 2015-08-30 ENCOUNTER — Other Ambulatory Visit: Payer: Self-pay | Admitting: Family Medicine

## 2015-09-02 ENCOUNTER — Encounter: Payer: Self-pay | Admitting: Internal Medicine

## 2015-09-02 DIAGNOSIS — M2042 Other hammer toe(s) (acquired), left foot: Secondary | ICD-10-CM | POA: Diagnosis not present

## 2015-09-06 DIAGNOSIS — M2042 Other hammer toe(s) (acquired), left foot: Secondary | ICD-10-CM | POA: Diagnosis not present

## 2015-09-07 ENCOUNTER — Telehealth: Payer: Self-pay | Admitting: Internal Medicine

## 2015-09-07 NOTE — Telephone Encounter (Signed)
Last Thursday she went to [facility] to have foot surgery. [phone]  They did EKG, came back, did another EKG, told her she had irregular heart beat.  Had her foot surgery. She asked for this information to be forwarded to Dr. Harrington Challenger and is calling to see if we received. Advised we have not seen this yet, but may be able to request it. Will send a message to medical records.  Advised patient after receiving if Dr. Harrington Challenger would like to see her sooner than her September appointment we will call her to move it up.  She is very Patent attorney.

## 2015-09-07 NOTE — Telephone Encounter (Signed)
New Message  Pt c/o irregular HR- recorded @ [facility]- was told to discuss this w/ RN. Please call back and discuss.

## 2015-09-08 ENCOUNTER — Telehealth: Payer: Self-pay | Admitting: Internal Medicine

## 2015-09-08 ENCOUNTER — Telehealth: Payer: Self-pay | Admitting: *Deleted

## 2015-09-08 NOTE — Telephone Encounter (Signed)
Spoke with Elmyra Ricks at [facility] she will be faxing over the last to [facility] over for Dr.Ross.

## 2015-09-08 NOTE — Telephone Encounter (Signed)
Rodman Key, RN at 09/07/2015 6:13 PM     Status: Signed       Expand All Collapse All   Last Thursday she went to [facility] to have foot surgery. [phone]  They did EKG, came back, did another EKG, told her she had irregular heart beat. Had her foot surgery. She asked for this information to be forwarded to Dr. Harrington Challenger and is calling to see if we received. Advised we have not seen this yet, but may be able to request it. Will send a message to medical records.  Advised patient after receiving if Dr. Harrington Challenger would like to see her sooner than her September appointment we will call her to move it up. She is very Patent attorney.         Received EKG from [facility]---Dr. Harrington Challenger reviewed.   SR w/ isolated PACs.  She is not concerned.  Called patient to inform.  She is appreciative for the call and said "see you in September."

## 2015-09-15 DIAGNOSIS — M47816 Spondylosis without myelopathy or radiculopathy, lumbar region: Secondary | ICD-10-CM | POA: Diagnosis not present

## 2015-09-27 ENCOUNTER — Other Ambulatory Visit: Payer: Self-pay | Admitting: Family Medicine

## 2015-09-27 NOTE — Telephone Encounter (Signed)
Sigs do not match. Please advise. Thanks.   RF request for mirtazapine LOV: 07/02/15 Next ov: 11/01/15 Last written: 04/26/15 #180 w/ 1RF

## 2015-09-29 DIAGNOSIS — L03032 Cellulitis of left toe: Secondary | ICD-10-CM | POA: Diagnosis not present

## 2015-10-04 DIAGNOSIS — L03032 Cellulitis of left toe: Secondary | ICD-10-CM | POA: Diagnosis not present

## 2015-10-04 DIAGNOSIS — L97529 Non-pressure chronic ulcer of other part of left foot with unspecified severity: Secondary | ICD-10-CM | POA: Diagnosis not present

## 2015-10-11 ENCOUNTER — Other Ambulatory Visit: Payer: Self-pay | Admitting: Family Medicine

## 2015-10-11 ENCOUNTER — Telehealth: Payer: Self-pay | Admitting: *Deleted

## 2015-10-11 DIAGNOSIS — H02132 Senile ectropion of right lower eyelid: Secondary | ICD-10-CM | POA: Diagnosis not present

## 2015-10-11 DIAGNOSIS — L97529 Non-pressure chronic ulcer of other part of left foot with unspecified severity: Secondary | ICD-10-CM | POA: Diagnosis not present

## 2015-10-11 DIAGNOSIS — H04563 Stenosis of bilateral lacrimal punctum: Secondary | ICD-10-CM | POA: Diagnosis not present

## 2015-10-11 DIAGNOSIS — H02135 Senile ectropion of left lower eyelid: Secondary | ICD-10-CM | POA: Diagnosis not present

## 2015-10-11 DIAGNOSIS — E1140 Type 2 diabetes mellitus with diabetic neuropathy, unspecified: Secondary | ICD-10-CM | POA: Diagnosis not present

## 2015-10-11 DIAGNOSIS — H11433 Conjunctival hyperemia, bilateral: Secondary | ICD-10-CM | POA: Diagnosis not present

## 2015-10-11 NOTE — Telephone Encounter (Signed)
RF request for lyrica LOV: 07/02/15 Next ov: 11/01/15 Last written: 04/05/15 #60 w/ 5RF  Please advise. Thanks.

## 2015-10-11 NOTE — Telephone Encounter (Signed)
Rx faxed

## 2015-10-11 NOTE — Telephone Encounter (Signed)
Opened in error

## 2015-10-15 ENCOUNTER — Other Ambulatory Visit: Payer: Self-pay | Admitting: Family Medicine

## 2015-10-15 DIAGNOSIS — Z1231 Encounter for screening mammogram for malignant neoplasm of breast: Secondary | ICD-10-CM

## 2015-10-18 DIAGNOSIS — L97529 Non-pressure chronic ulcer of other part of left foot with unspecified severity: Secondary | ICD-10-CM | POA: Diagnosis not present

## 2015-10-18 DIAGNOSIS — E1140 Type 2 diabetes mellitus with diabetic neuropathy, unspecified: Secondary | ICD-10-CM | POA: Diagnosis not present

## 2015-10-21 DIAGNOSIS — E119 Type 2 diabetes mellitus without complications: Secondary | ICD-10-CM | POA: Diagnosis not present

## 2015-10-21 DIAGNOSIS — H353113 Nonexudative age-related macular degeneration, right eye, advanced atrophic without subfoveal involvement: Secondary | ICD-10-CM | POA: Diagnosis not present

## 2015-10-21 DIAGNOSIS — H353133 Nonexudative age-related macular degeneration, bilateral, advanced atrophic without subfoveal involvement: Secondary | ICD-10-CM | POA: Diagnosis not present

## 2015-10-21 DIAGNOSIS — H35372 Puckering of macula, left eye: Secondary | ICD-10-CM | POA: Diagnosis not present

## 2015-10-21 DIAGNOSIS — H35371 Puckering of macula, right eye: Secondary | ICD-10-CM | POA: Diagnosis not present

## 2015-10-21 DIAGNOSIS — H35373 Puckering of macula, bilateral: Secondary | ICD-10-CM | POA: Diagnosis not present

## 2015-10-21 DIAGNOSIS — H353123 Nonexudative age-related macular degeneration, left eye, advanced atrophic without subfoveal involvement: Secondary | ICD-10-CM | POA: Diagnosis not present

## 2015-10-22 DIAGNOSIS — L97529 Non-pressure chronic ulcer of other part of left foot with unspecified severity: Secondary | ICD-10-CM | POA: Diagnosis not present

## 2015-10-22 DIAGNOSIS — E11621 Type 2 diabetes mellitus with foot ulcer: Secondary | ICD-10-CM | POA: Diagnosis not present

## 2015-10-29 ENCOUNTER — Ambulatory Visit
Admission: RE | Admit: 2015-10-29 | Discharge: 2015-10-29 | Disposition: A | Discharge status: Home / Self Care | Payer: Medicare Other | Source: Ambulatory Visit | Attending: Family Medicine | Admitting: Family Medicine

## 2015-10-29 DIAGNOSIS — L602 Onychogryphosis: Secondary | ICD-10-CM | POA: Diagnosis not present

## 2015-10-29 DIAGNOSIS — I70293 Other atherosclerosis of native arteries of extremities, bilateral legs: Secondary | ICD-10-CM | POA: Diagnosis not present

## 2015-10-29 DIAGNOSIS — Z1231 Encounter for screening mammogram for malignant neoplasm of breast: Secondary | ICD-10-CM

## 2015-10-29 DIAGNOSIS — L84 Corns and callosities: Secondary | ICD-10-CM | POA: Diagnosis not present

## 2015-10-29 DIAGNOSIS — E1351 Other specified diabetes mellitus with diabetic peripheral angiopathy without gangrene: Secondary | ICD-10-CM | POA: Diagnosis not present

## 2015-11-01 ENCOUNTER — Ambulatory Visit: Payer: Medicare Other | Admitting: Family Medicine

## 2015-11-01 ENCOUNTER — Other Ambulatory Visit: Payer: Self-pay | Admitting: Family Medicine

## 2015-11-04 DIAGNOSIS — L97529 Non-pressure chronic ulcer of other part of left foot with unspecified severity: Secondary | ICD-10-CM | POA: Diagnosis not present

## 2015-11-04 DIAGNOSIS — E1140 Type 2 diabetes mellitus with diabetic neuropathy, unspecified: Secondary | ICD-10-CM | POA: Diagnosis not present

## 2015-11-08 ENCOUNTER — Ambulatory Visit (INDEPENDENT_AMBULATORY_CARE_PROVIDER_SITE_OTHER): Payer: Medicare Other | Admitting: Family Medicine

## 2015-11-08 ENCOUNTER — Encounter: Payer: Self-pay | Admitting: Family Medicine

## 2015-11-08 VITALS — BP 124/69 | HR 90 | Temp 98.0°F | Resp 16 | Ht 61.5 in | Wt 166.8 lb

## 2015-11-08 DIAGNOSIS — I10 Essential (primary) hypertension: Secondary | ICD-10-CM

## 2015-11-08 DIAGNOSIS — E11628 Type 2 diabetes mellitus with other skin complications: Secondary | ICD-10-CM

## 2015-11-08 DIAGNOSIS — L988 Other specified disorders of the skin and subcutaneous tissue: Secondary | ICD-10-CM | POA: Diagnosis not present

## 2015-11-08 MED ORDER — BLOOD GLUCOSE METER KIT: PACK | 0 refills | Status: DC

## 2015-11-08 NOTE — Progress Notes (Signed)
OFFICE VISIT  11/08/2015   CC:  Chief Complaint  Patient presents with  . Follow-up     HPI:    Patient is a 82 y.o. Caucasian female who presents for 4 mo f/u DM 2, HTN, HLD. Labs 4 mo ago all excellent: A1c was 6.3%.  Fasting glucose 100-120 consistently.  Doesn't really eat a diabetic type diet but is sounds pretty healthy by her description. Blood pressure: occ home monitoring show normal result. Hyperlipidemia: tolerating lipitor well.  Recently got left 2nd toe operated on: "cut a piece of bone out of it that was causing toe-rubbing/ulceration). Has appt to get R knee injection b/c of knee pain due to walking "out of wack" from her recent toe problem.  Past Medical History:  Diagnosis Date  . Abdominal pain, right lower quadrant   . Anxiety   . Bursitis   . Cataracts, both eyes    soon to have cataract surg as of 03/2015  . Depression   . Diabetes mellitus with skin complications ([place])   . Diverticulosis of colon    noted on colonoscopy 2008  . Dyspnea    a. 09/2011 CTA Chest: No PE, Ca2+ cors;  b. 09/2011 R&L heart Cath: Relatively nl R heart pressures, nl co/ci, nonobs cad, nl EF.  Marland Kitchen External hemorrhoid   . Former smoker    30 pack-yrs, quit 2005  . History of adenomatous polyp of colon   . Hot flashes    restarted estradiol 01/2015  . Hyperlipidemia   . Hypertension   . Hypothyroidism   . IBS (irritable bowel syndrome)   . Interstitial cystitis   . Liver function study, abnormal   . Lumbar degenerative disc disease    Facet-mediated pain per Dr. Nelva Bush.  Has gotten repeated LB injections, most recently April 2016 in [place].  . Menopausal syndrome   . Morton's neuroma   . Obesity, unspecified   . Other specified gastritis without mention of hemorrhage    hx of antral gastritis on EGD 05/2009  . Peripheral neuropathy ([place])    per EMG 11/2012.  Intolerant of gabapentin, failed pamelor.  Radiofrequency neurotomy 12/11/14 helped LB pain (Dr. Nelva Bush)  . Recurrent UTI     on daily antibiotic prophylaxis by her urologist.  . RLS (restless legs syndrome)   . Tarsal tunnel syndrome   . Unspecified venous (peripheral) insufficiency     Past Surgical History:  Procedure Laterality Date  . ANKLE SURGERY     bilateral  . CARDIAC CATHETERIZATION  09/2011   Nonobstructive CAD  . COLONOSCOPY  2002/2005, 11/14/06, and 05/2009   polyps 2002 but none 2005 or 2008 or 2011.  Recall recommended 7-10 yrs  . ESOPHAGOGASTRODUODENOSCOPY  05/2009   Moderate antral gastritis  . FOOT SURGERY     R foot bunionectomy and arthroplasty of digits R foot.  Arthroplasty digits 3 and 4 L foot.  Marland Kitchen HAMMER TOE SURGERY     bilateral  . left tarsal tunnel release     sept '11 (Dr Beola Cord)  . radiofrequency neurotomy  06/18/2015   bilat L3-4 medial branch nerve and bilat L5 dorsal ramus nerve.(Dr. Nelva Bush)  . TONSILLECTOMY    . TOTAL ABDOMINAL HYSTERECTOMY W/ BILATERAL SALPINGOOPHORECTOMY    . TRANSTHORACIC ECHOCARDIOGRAM  08/2011   Grade I DD    Outpatient Medications Prior to Visit  Medication Sig Dispense Refill  . ALPRAZolam (XANAX) 0.25 MG tablet Take 0.25 mg by mouth at bedtime as needed for anxiety.    Marland Kitchen  aspirin 81 MG tablet Take 81 mg by mouth daily.    Marland Kitchen atorvastatin (LIPITOR) 20 MG tablet TAKE ONE TABLET BY MOUTH DAILY 90 tablet 0  . BESIVANCE 0.6 % SUSP Reported on 05/10/2015  1  . calcium carbonate (TUMS - DOSED IN MG ELEMENTAL CALCIUM) 500 MG chewable tablet Chew 2 tablets by mouth daily.    . cetirizine (ZYRTEC) 10 MG tablet Take 10 mg by mouth as needed for allergies.    . DULoxetine (CYMBALTA) 60 MG capsule Take 60 mg by mouth daily.  1  . DUREZOL 0.05 % EMUL Reported on 05/10/2015  1  . estradiol (ESTRACE) 1 MG tablet TAKE ONE TABLET BY MOUTH DAILY 30 tablet 1  . HYDROcodone-acetaminophen (NORCO/VICODIN) 5-325 MG per tablet Take 1 tablet by mouth every 6 (six) hours as needed for moderate pain.    Marland Kitchen ILEVRO 0.3 % ophthalmic suspension Reported on 05/10/2015  1  .  levothyroxine (SYNTHROID, LEVOTHROID) 25 MCG tablet TAKE ONE TABLET BY MOUTH DAILY 90 tablet 3  . loperamide (IMODIUM) 2 MG capsule Take 2 mg by mouth as needed for diarrhea or loose stools. Reported on 05/10/2015    . losartan-hydrochlorothiazide (HYZAAR) 50-12.5 MG tablet TAKE ONE TABLET BY MOUTH DAILY 90 tablet 3  . LYRICA 50 MG capsule TAKE ONE CAPSULE BY MOUTH TWICE DAILY 60 capsule 5  . meloxicam (MOBIC) 15 MG tablet TAKE ONE TABLET BY MOUTH DAILY AS NEEDED FOR PAIN 90 tablet 3  . metFORMIN (GLUCOPHAGE) 1000 MG tablet TAKE ONE TABLET BY MOUTH TWICE DAILY 180 tablet 1  . metoprolol (LOPRESSOR) 50 MG tablet Take 1 tablet (50 mg total) by mouth 2 (two) times daily. 180 tablet 3  . mirtazapine (REMERON) 30 MG tablet TAKE 1/2 TO 1 TABLET BY MOUTH AT BEDTIME FOR pruitis 90 tablet 3  . omeprazole (PRILOSEC) 20 MG capsule TAKE ONE CAPSULE BY MOUTH DAILY 90 capsule 3  . oxybutynin (DITROPAN-XL) 10 MG 24 hr tablet TAKE ONE TABLET BY MOUTH DAILY 90 tablet 3  . potassium chloride SA (K-DUR,KLOR-CON) 20 MEQ tablet TAKE ONE TABLET BY MOUTH DAILY 90 tablet 3  . Probiotic Product (ALIGN) 4 MG CAPS Take 1 capsule (4 mg total) by mouth daily. 30 capsule 2  . levothyroxine (SYNTHROID, LEVOTHROID) 25 MCG tablet Take 25 mcg by mouth daily.  3  . meloxicam (MOBIC) 15 MG tablet TAKE ONE TABLET BY MOUTH DAILY AS NEEDED FOR PAIN  3  . oxybutynin (DITROPAN-XL) 10 MG 24 hr tablet Take 10 mg by mouth daily.  3   No facility-administered medications prior to visit.     Allergies  Allergen Reactions  . Gabapentin Itching  . Sulfonamide Derivatives     REACTION: Nausea    ROS As per HPI  PE: Blood pressure 124/69, pulse 90, temperature 98 F (36.7 C), temperature source Oral, resp. rate 16, height 5' 1.5" (1.562 m), weight 166 lb 12 oz (75.6 kg), SpO2 96 %. Gen: Alert, well appearing.  Patient is oriented to person, place, time, and situation. [id number]: no injection, icteris, swelling, or exudate.  EOMI,  PERRLA. Mouth: lips without lesion/swelling.  Oral mucosa pink and moist. Oropharynx without erythema, exudate, or swelling.  CV: RRR, no m/r/g.   LUNGS: CTA bilat, nonlabored resps, good aeration in all lung fields. EXT: no clubbing, cyanosis.  She has 1+ pitting edema bilat.  LABS:  Lab Results  Component Value Date   TSH 1.69 07/02/2015   Lab Results  Component Value Date  WBC 8.6 09/03/2014   HGB 12.8 09/03/2014   HCT 39.3 09/03/2014   MCV 95.1 09/03/2014   PLT 346.0 09/03/2014   Lab Results  Component Value Date   CREATININE 0.68 07/02/2015   BUN 18 07/02/2015   NA 139 07/02/2015   K 4.1 07/02/2015   CL 101 07/02/2015   CO2 28 07/02/2015   Lab Results  Component Value Date   ALT 18 07/02/2015   AST 18 07/02/2015   ALKPHOS 81 07/02/2015   BILITOT 0.4 07/02/2015   Lab Results  Component Value Date   CHOL 136 01/18/2015   Lab Results  Component Value Date   HDL 36.00 (L) 01/18/2015   Lab Results  Component Value Date   LDLCALC DEL 05/02/2007   Lab Results  Component Value Date   TRIG 201.0 (H) 01/18/2015   Lab Results  Component Value Date   CHOLHDL 4 01/18/2015   Lab Results  Component Value Date   HGBA1C 6.3 07/02/2015    IMPRESSION AND PLAN:  1) DM 2, well controlled. The current medical regimen is effective;  continue present plan and medications. Check HbA1c today.  2) Essential HTN: The current medical regimen is effective;  continue present plan and medications. Check lytes/cr today.  3) Hyperlipidemia: tolerating lipitor.  Lipids 12/2014 ok, transaminases normal 4 mo ago.  An After Visit Summary was printed and given to the patient.  FOLLOW UP: No Follow-up on file.  Signed:  Crissie Sickles, MD           11/08/2015

## 2015-11-08 NOTE — Progress Notes (Signed)
Pre visit review using our clinic review tool, if applicable. No additional management support is needed unless otherwise documented below in the visit note. 

## 2015-11-09 ENCOUNTER — Encounter: Payer: Self-pay | Admitting: Family Medicine

## 2015-11-09 LAB — BASIC METABOLIC PANEL
BUN: 16 mg/dL (ref 6–23)
CALCIUM: 9.6 mg/dL (ref 8.4–10.5)
CO2: 26 mEq/L (ref 19–32)
Chloride: 101 mEq/L (ref 96–112)
Creatinine, Ser: 0.69 mg/dL (ref 0.40–1.20)
GFR: 87.07 mL/min (ref >60.00)
GLUCOSE: 126 mg/dL — AB (ref 70–99)
Potassium: 4.3 mEq/L (ref 3.5–5.1)
Sodium: 139 mEq/L (ref 135–145)

## 2015-11-09 LAB — HEMOGLOBIN A1C: Hgb A1c MFr Bld: 6 % (ref 4.6–6.5)

## 2015-11-11 DIAGNOSIS — M25561 Pain in right knee: Secondary | ICD-10-CM | POA: Diagnosis not present

## 2015-11-11 DIAGNOSIS — M1711 Unilateral primary osteoarthritis, right knee: Secondary | ICD-10-CM | POA: Diagnosis not present

## 2015-11-15 DIAGNOSIS — H02122 Mechanical ectropion of right lower eyelid: Secondary | ICD-10-CM | POA: Diagnosis not present

## 2015-11-15 DIAGNOSIS — H04563 Stenosis of bilateral lacrimal punctum: Secondary | ICD-10-CM | POA: Diagnosis not present

## 2015-11-15 DIAGNOSIS — H02125 Mechanical ectropion of left lower eyelid: Secondary | ICD-10-CM | POA: Diagnosis not present

## 2015-11-15 DIAGNOSIS — H02132 Senile ectropion of right lower eyelid: Secondary | ICD-10-CM | POA: Diagnosis not present

## 2015-11-15 DIAGNOSIS — H02135 Senile ectropion of left lower eyelid: Secondary | ICD-10-CM | POA: Diagnosis not present

## 2015-11-15 DIAGNOSIS — H11823 Conjunctivochalasis, bilateral: Secondary | ICD-10-CM | POA: Diagnosis not present

## 2015-11-30 ENCOUNTER — Other Ambulatory Visit: Payer: Self-pay | Admitting: Family Medicine

## 2015-12-14 ENCOUNTER — Encounter: Payer: Self-pay | Admitting: Internal Medicine

## 2015-12-14 ENCOUNTER — Encounter: Payer: Self-pay | Admitting: Family Medicine

## 2015-12-17 ENCOUNTER — Ambulatory Visit (INDEPENDENT_AMBULATORY_CARE_PROVIDER_SITE_OTHER): Payer: Medicare Other

## 2015-12-17 DIAGNOSIS — M1711 Unilateral primary osteoarthritis, right knee: Secondary | ICD-10-CM | POA: Diagnosis not present

## 2015-12-17 DIAGNOSIS — M25561 Pain in right knee: Secondary | ICD-10-CM | POA: Diagnosis not present

## 2015-12-17 DIAGNOSIS — Z23 Encounter for immunization: Secondary | ICD-10-CM | POA: Diagnosis not present

## 2015-12-24 ENCOUNTER — Ambulatory Visit: Payer: BLUE CROSS/BLUE SHIELD | Admitting: Internal Medicine

## 2015-12-24 DIAGNOSIS — M1711 Unilateral primary osteoarthritis, right knee: Secondary | ICD-10-CM | POA: Diagnosis not present

## 2015-12-27 ENCOUNTER — Ambulatory Visit (INDEPENDENT_AMBULATORY_CARE_PROVIDER_SITE_OTHER): Payer: Medicare Other | Admitting: Internal Medicine

## 2015-12-27 ENCOUNTER — Encounter: Payer: Self-pay | Admitting: Internal Medicine

## 2015-12-27 VITALS — BP 134/66 | HR 62 | Ht 61.5 in | Wt 169.0 lb

## 2015-12-27 DIAGNOSIS — R5383 Other fatigue: Secondary | ICD-10-CM

## 2015-12-27 DIAGNOSIS — R0602 Shortness of breath: Secondary | ICD-10-CM

## 2015-12-27 DIAGNOSIS — I519 Heart disease, unspecified: Secondary | ICD-10-CM

## 2015-12-27 DIAGNOSIS — I10 Essential (primary) hypertension: Secondary | ICD-10-CM

## 2015-12-27 LAB — LIPID PANEL
Cholesterol: 147 mg/dL (ref 125–200)
HDL: 45 mg/dL — ABNORMAL LOW (ref >=46)
LDL CALC: 46 mg/dL (ref <130)
Total CHOL/HDL Ratio: 3.3 Ratio (ref <=5.0)
Triglycerides: 282 mg/dL — ABNORMAL HIGH (ref <150)
VLDL: 56 mg/dL — AB (ref <30)

## 2015-12-27 LAB — TSH: TSH: 1.99 mIU/L

## 2015-12-27 LAB — CBC
HCT: 33.8 % — ABNORMAL LOW (ref 35.0–45.0)
Hemoglobin: 11.5 g/dL — ABNORMAL LOW (ref 11.7–15.5)
MCH: 30.7 pg (ref 27.0–33.0)
MCHC: 34 g/dL (ref 32.0–36.0)
MCV: 90.4 fL (ref 80.0–100.0)
MPV: 9.3 fL (ref 7.5–12.5)
PLATELETS: 350 10*3/uL (ref 140–400)
RBC: 3.74 MIL/uL — AB (ref 3.80–5.10)
RDW: 13.2 % (ref 11.0–15.0)
WBC: 8.1 10*3/uL (ref 3.8–10.8)

## 2015-12-27 NOTE — Progress Notes (Signed)
Cardiology Office Note   Date:  12/27/2015   ID:  Katherine Best, DOB 11/29/1933, MRN [id number]  PCP:  Tammi Sou, MD  Cardiologist:   Dorris Carnes, MD   No chief complaint on file.  Pt presetns for f/u of mild CAD     History of Present Illness: Katherine Best is a 82 y.o. female with a history of Mild CAD by cath  Normal R heart pressures  Also mild diastolic dysfunction and DM  I saw her back in Aug 2016    LDL on check in 2016 was 43    Patient says she gets SOB with activity    Started about 6 months ago   Was going to water aerobics  Tiring   Tried silver sneakers   1/2 through has to  Sit down   Last night cooking dinner  Exhausted  Back spasm    No dizziness  No CP     Outpatient Medications Prior to Visit  Medication Sig Dispense Refill  . ALPRAZolam (XANAX) 0.25 MG tablet Take 0.25 mg by mouth at bedtime as needed for anxiety.    Marland Kitchen aspirin 81 MG tablet Take 81 mg by mouth daily.    Marland Kitchen atorvastatin (LIPITOR) 20 MG tablet TAKE ONE TABLET BY MOUTH DAILY 90 tablet 0  . blood glucose meter kit and supplies Check blood sugar once every morning. 1 each 0  . calcium carbonate (TUMS - DOSED IN MG ELEMENTAL CALCIUM) 500 MG chewable tablet Chew 2 tablets by mouth daily.    . cetirizine (ZYRTEC) 10 MG tablet Take 10 mg by mouth as needed for allergies.    . DULoxetine (CYMBALTA) 60 MG capsule Take 60 mg by mouth daily.  1  . estradiol (ESTRACE) 1 MG tablet TAKE ONE TABLET BY MOUTH DAILY 30 tablet 1  . HYDROcodone-acetaminophen (NORCO/VICODIN) 5-325 MG per tablet Take 1 tablet by mouth every 6 (six) hours as needed for moderate pain.    Marland Kitchen levothyroxine (SYNTHROID, LEVOTHROID) 25 MCG tablet TAKE ONE TABLET BY MOUTH DAILY 90 tablet 3  . loperamide (IMODIUM) 2 MG capsule Take 2 mg by mouth as needed for diarrhea or loose stools. Reported on 05/10/2015    . losartan-hydrochlorothiazide (HYZAAR) 50-12.5 MG tablet TAKE ONE TABLET BY MOUTH DAILY 90 tablet 3  . LYRICA 50 MG capsule TAKE  ONE CAPSULE BY MOUTH TWICE DAILY 60 capsule 5  . meloxicam (MOBIC) 15 MG tablet TAKE ONE TABLET BY MOUTH DAILY AS NEEDED FOR PAIN 90 tablet 3  . metFORMIN (GLUCOPHAGE) 1000 MG tablet TAKE ONE TABLET BY MOUTH TWICE DAILY 180 tablet 1  . metoprolol (LOPRESSOR) 50 MG tablet Take 1 tablet (50 mg total) by mouth 2 (two) times daily. 180 tablet 3  . mirtazapine (REMERON) 30 MG tablet TAKE 1/2 TO 1 TABLET BY MOUTH AT BEDTIME FOR pruitis 90 tablet 3  . omeprazole (PRILOSEC) 20 MG capsule TAKE ONE CAPSULE BY MOUTH DAILY 90 capsule 3  . OVER THE COUNTER MEDICATION Eye Vitamin - Areds2    . oxybutynin (DITROPAN-XL) 10 MG 24 hr tablet TAKE ONE TABLET BY MOUTH DAILY 90 tablet 3  . potassium chloride SA (K-DUR,KLOR-CON) 20 MEQ tablet TAKE ONE TABLET BY MOUTH DAILY 90 tablet 3  . Probiotic Product (ALIGN) 4 MG CAPS Take 1 capsule (4 mg total) by mouth daily. 30 capsule 2  . BESIVANCE 0.6 % SUSP Reported on 05/10/2015  1  . DUREZOL 0.05 % EMUL Reported on 05/10/2015  1  .  ILEVRO 0.3 % ophthalmic suspension Reported on 05/10/2015  1   No facility-administered medications prior to visit.      Allergies:   Gabapentin and Sulfonamide derivatives   Past Medical History:  Diagnosis Date  . Abdominal pain, right lower quadrant   . Anxiety   . Bursitis   . Cataracts, both eyes    soon to have cataract surg as of 03/2015  . Depression   . 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 Diverticulosis of colon    noted on colonoscopy 2008  . Dyspnea    a. 09/2011 CTA Chest: No PE, Ca2+ cors;  b. 09/2011 R&L heart Cath: Relatively nl R heart pressures, nl co/ci, nonobs cad, nl EF.  Marland Kitchen External hemorrhoid   . Former smoker    30 pack-yrs, quit 2005  . History of adenomatous polyp of colon   . Hot flashes    restarted estradiol 01/2015  . Hyperlipidemia   . Hypertension   . Hypothyroidism   . IBS (irritable bowel syndrome)   . Interstitial cystitis   . Liver function study, abnormal   . Lumbar  degenerative disc disease    Facet-mediated pain per Dr. Nelva Bush.  Has gotten repeated LB injections, most recently April 2016 in [place].  . Menopausal syndrome   . Morton's neuroma   . Obesity, unspecified   . Other specified gastritis without mention of hemorrhage    hx of antral gastritis on EGD 05/2009  . Peripheral neuropathy ([place])    per EMG 11/2012.  Intolerant of gabapentin, failed pamelor.  Radiofrequency neurotomy 12/11/14 helped LB pain (Dr. Nelva Bush)  . Recurrent UTI    on daily antibiotic prophylaxis by her urologist.  . RLS (restless legs syndrome)   . Tarsal tunnel syndrome   . Unspecified venous (peripheral) insufficiency     Past Surgical History:  Procedure Laterality Date  . ANKLE SURGERY     bilateral  . CARDIAC CATHETERIZATION  09/2011   Nonobstructive CAD  . COLONOSCOPY  2002/2005, 11/14/06, and 05/2009   polyps 2002 but none 2005 or 2008 or 2011.  Recall recommended 7-10 yrs  . ESOPHAGOGASTRODUODENOSCOPY  05/2009   Moderate antral gastritis  . FOOT SURGERY     R foot bunionectomy and arthroplasty of digits R foot.  Arthroplasty digits 3 and 4 L foot.  Marland Kitchen HAMMER TOE SURGERY     bilateral  . left tarsal tunnel release     sept '11 (Dr Beola Cord)  . radiofrequency neurotomy  06/18/2015   bilat L3-4 medial branch nerve and bilat L5 dorsal ramus nerve.(Dr. Nelva Bush)  . TONSILLECTOMY    . TOTAL ABDOMINAL HYSTERECTOMY W/ BILATERAL SALPINGOOPHORECTOMY    . TRANSTHORACIC ECHOCARDIOGRAM  08/2011   Best I DD     Social History:  The patient  reports that she quit smoking about 13 years ago. Her smoking use included Cigarettes. She has a 35.00 pack-year smoking history. She quit smokeless tobacco use about 10 years ago. She reports that she does not drink alcohol or use drugs.   Family History:  The patient's family history includes Cancer in her mother; Diabetes in her maternal grandfather; Hyperlipidemia in her father; Hypertension in her father.    ROS:  Please see the history  of present illness. All other systems are reviewed and  Negative to the above problem except as noted.    PHYSICAL EXAM: VS:  BP 134/66   Pulse 62   Ht 5' 1.5" (1.562 m)   Wt  169 lb (76.7 kg)   SpO2 99%   BMI 31.42 kg/m   GEN: Obese 82 yo  in no acute distress HEENT: normal Neck: no JVD, carotid bruits, or masses Cardiac: RRR; no murmurs, rubs, or gallops,no edema  Respiratory:  clear to auscultation bilaterally, normal work of breathing GI: soft, nontender, nondistended, + BS  No hepatomegaly  MS: no deformity Moving all extremities   Skin: warm and dry, no rash Neuro:  Strength and sensation are intact Psych: euthymic mood, full affect   EKG:  EKG is not ordered today.   Lipid Panel    Component Value Date/Time   CHOL 136 01/18/2015 0826   TRIG 201.0 (H) 01/18/2015 0826   HDL 36.00 (L) 01/18/2015 0826   CHOLHDL 4 01/18/2015 0826   VLDL 40.2 (H) 01/18/2015 0826   LDLCALC DEL 05/02/2007 1225   LDLDIRECT 76.0 01/18/2015 0826      Wt Readings from Last 3 Encounters:  12/27/15 169 lb (76.7 kg)  11/08/15 166 lb 12 oz (75.6 kg)  07/02/15 168 lb 4 oz (76.3 kg)      ASSESSMENT AND PLAN:  1 Dyspnea  Concerning  WIll set up for labs  If normal plan for echo to eval diastolic funciton  I would also recomm lexiscan    2  CAD  WIll get Lexiscan [id number]  3  HL  Check liopids       Current medicines are reviewed at length with the patient today.  The patient does not have concerns regarding medicines.Signed, Dorris Carnes, MD  12/27/2015 10:47 AM    [address], [place], [place]  [address] Phone: [phone]; Fax: [phone]

## 2015-12-27 NOTE — Patient Instructions (Signed)
Your physician recommends that you continue on your current medications as directed. Please refer to the Current Medication list given to you today.  Your physician recommends that you return for lab work today (CBC, TSH, LIPID) Your physician has requested that you have an echocardiogram. Echocardiography is a painless test that uses sound waves to create images of your heart. It provides your doctor with information about the size and shape of your heart and how well your heart's chambers and valves are working. This procedure takes approximately one hour. There are no restrictions for this procedure.  Your physician has requested that you have a lexiscan myoview. For further information please visit [email or web address]. Please follow instruction sheet, as given.  Pharmacologic Stress Electrocardiogram A pharmacologic stress electrocardiogram is a heart (cardiac) test that uses nuclear imaging to evaluate the blood supply to your heart. This test may also be called a pharmacologic stress electrocardiography. Pharmacologic means that a medicine is used to increase your heart rate and blood pressure.  This stress test is done to find areas of poor blood flow to the heart by determining the extent of coronary artery disease (CAD). Some people exercise on a treadmill, which naturally increases the blood flow to the heart. For those people unable to exercise on a treadmill, a medicine is used. This medicine stimulates your heart and will cause your heart to beat harder and more quickly, as if you were exercising.  Pharmacologic stress tests can help determine:  The adequacy of blood flow to your heart during increased levels of activity in order to clear you for discharge home.  The extent of coronary artery blockage caused by CAD.  Your prognosis if you have suffered a heart attack.  The effectiveness of cardiac procedures done, such as an angioplasty, which can increase the circulation in your  coronary arteries.  Causes of chest pain or pressure. LET [facility] CARE PROVIDER KNOW ABOUT:  Any allergies you have.  All medicines you are taking, including vitamins, herbs, eye drops, creams, and over-the-counter medicines.  Previous problems you or members of your family have had with the use of anesthetics.  Any blood disorders you have.  Previous surgeries you have had.  Medical conditions you have.  Possibility of pregnancy, if this applies.  If you are currently breastfeeding. RISKS AND COMPLICATIONS Generally, this is a safe procedure. However, as with any procedure, complications can occur. Possible complications include:  You develop pain or pressure in the following areas:  Chest.  Jaw or neck.  Between your shoulder blades.  Radiating down your left arm.  Headache.  Dizziness or light-headedness.  Shortness of breath.  Increased or irregular heartbeat.  Low blood pressure.  Nausea or vomiting.  Flushing.  Redness going up the arm and slight pain during injection of medicine.  Heart attack (rare). BEFORE THE PROCEDURE   Avoid all forms of caffeine for 24 hours before your test or as directed by your health care provider. This includes coffee, tea (even decaffeinated tea), caffeinated sodas, chocolate, cocoa, and certain pain medicines.  Follow your health care provider's instructions regarding eating and drinking before the test.  Take your medicines as directed at regular times with water unless instructed otherwise. Exceptions may include:  If you have diabetes, ask how you are to take your insulin or pills. It is common to adjust insulin dosing the morning of the test.  If you are taking beta-blocker medicines, it is important to talk to your health care provider about these  medicines well before the date of your test. Taking beta-blocker medicines may interfere with the test. In some cases, these medicines need to be changed or stopped 24  hours or more before the test.  If you wear a nitroglycerin patch, it may need to be removed prior to the test. Ask your health care provider if the patch should be removed before the test.  If you use an inhaler for any breathing condition, bring it with you to the test.  If you are an outpatient, bring a snack so you can eat right after the stress phase of the test.  Do not smoke for 4 hours prior to the test or as directed by your health care provider.  Do not apply lotions, powders, creams, or oils on your chest prior to the test.  Wear comfortable shoes and clothing. Let your health care provider know if you were unable to complete or follow the preparations for your test. PROCEDURE   Multiple patches (electrodes) will be put on your chest. If needed, small areas of your chest may be shaved to get better contact with the electrodes. Once the electrodes are attached to your body, multiple wires will be attached to the electrodes, and your heart rate will be monitored.  An IV access will be started. A nuclear trace (isotope) is given. The isotope may be given intravenously, or it may be swallowed. Nuclear refers to several types of radioactive isotopes, and the nuclear isotope lights up the arteries so that the nuclear images are clear. The isotope is absorbed by your body. This results in low radiation exposure.  A resting nuclear image is taken to show how your heart functions at rest.  A medicine is given through the IV access.  A second scan is done about 1 hour after the medicine injection and determines how your heart functions under stress.  During this stress phase, you will be connected to an electrocardiogram machine. Your blood pressure and oxygen levels will be monitored. AFTER THE PROCEDURE   Your heart rate and blood pressure will be monitored after the test.  You may return to your normal schedule, including diet,activities, and medicines, unless your health care  provider tells you otherwise.   This information is not intended to replace advice given to you by your health care provider. Make sure you discuss any questions you have with your health care provider.   Document Released: 07/30/2008 Document Revised: 03/18/2013 Document Reviewed: 11/18/2012 Elsevier Interactive Patient Education [employer].

## 2015-12-30 ENCOUNTER — Telehealth (HOSPITAL_COMMUNITY): Payer: Self-pay | Admitting: *Deleted

## 2015-12-30 NOTE — Telephone Encounter (Signed)
Left message on voicemail per DPR in reference to upcoming appointment scheduled on 01/04/16 with detailed instructions given per Myocardial Perfusion Study Information Sheet for the test. LM to arrive 15 minutes early, and that it is imperative to arrive on time for appointment to keep from having the test rescheduled. If you need to cancel or reschedule your appointment, please call the office within 24 hours of your appointment. Failure to do so may result in a cancellation of your appointment, and a $50 no show fee. Phone number given for call back for any questions. Remingtyn Depaola Jacqueline    

## 2015-12-31 DIAGNOSIS — M1711 Unilateral primary osteoarthritis, right knee: Secondary | ICD-10-CM | POA: Diagnosis not present

## 2016-01-03 ENCOUNTER — Encounter: Payer: Self-pay | Admitting: Family Medicine

## 2016-01-03 ENCOUNTER — Other Ambulatory Visit: Payer: Self-pay | Admitting: Family Medicine

## 2016-01-04 ENCOUNTER — Ambulatory Visit (HOSPITAL_COMMUNITY): Payer: Medicare Other | Attending: Cardiovascular Disease

## 2016-01-04 ENCOUNTER — Other Ambulatory Visit: Payer: Self-pay

## 2016-01-04 ENCOUNTER — Ambulatory Visit (HOSPITAL_BASED_OUTPATIENT_CLINIC_OR_DEPARTMENT_OTHER): Payer: Medicare Other

## 2016-01-04 DIAGNOSIS — I519 Heart disease, unspecified: Secondary | ICD-10-CM

## 2016-01-04 DIAGNOSIS — E119 Type 2 diabetes mellitus without complications: Secondary | ICD-10-CM | POA: Insufficient documentation

## 2016-01-04 DIAGNOSIS — I2510 Atherosclerotic heart disease of native coronary artery without angina pectoris: Secondary | ICD-10-CM | POA: Insufficient documentation

## 2016-01-04 DIAGNOSIS — R5383 Other fatigue: Secondary | ICD-10-CM

## 2016-01-04 DIAGNOSIS — R079 Chest pain, unspecified: Secondary | ICD-10-CM | POA: Insufficient documentation

## 2016-01-04 DIAGNOSIS — E785 Hyperlipidemia, unspecified: Secondary | ICD-10-CM | POA: Diagnosis not present

## 2016-01-04 DIAGNOSIS — R0602 Shortness of breath: Secondary | ICD-10-CM | POA: Diagnosis not present

## 2016-01-04 DIAGNOSIS — I10 Essential (primary) hypertension: Secondary | ICD-10-CM

## 2016-01-04 HISTORY — PX: CARDIOVASCULAR STRESS TEST: SHX262

## 2016-01-04 LAB — ECHOCARDIOGRAM COMPLETE
Height: 62 in
Weight: 2704 oz

## 2016-01-04 LAB — MYOCARDIAL PERFUSION IMAGING
CHL CUP NUCLEAR SDS: 4
CHL CUP NUCLEAR SRS: 8
CSEPPHR: 83 {beats}/min
LV dias vol: 56 mL (ref 46–106)
LV sys vol: 10 mL
RATE: 0.33
Rest HR: 68 {beats}/min
SSS: 8
TID: 0.82

## 2016-01-04 MED ORDER — REGADENOSON 0.4 MG/5ML IV SOLN
0.4000 mg | Freq: Once | INTRAVENOUS | Status: AC
  Administered 2016-01-04: 0.4 mg via INTRAVENOUS

## 2016-01-04 MED ORDER — TECHNETIUM TC 99M TETROFOSMIN IV KIT
10.3000 | PACK | Freq: Once | INTRAVENOUS | Status: AC | PRN
  Administered 2016-01-04: 10.3 via INTRAVENOUS
  Filled 2016-01-04: qty 11

## 2016-01-04 MED ORDER — TECHNETIUM TC 99M TETROFOSMIN IV KIT
32.4000 | PACK | Freq: Once | INTRAVENOUS | Status: AC | PRN
  Administered 2016-01-04: 32.4 via INTRAVENOUS
  Filled 2016-01-04: qty 33

## 2016-01-05 ENCOUNTER — Telehealth: Payer: Self-pay | Admitting: Internal Medicine

## 2016-01-05 NOTE — Telephone Encounter (Signed)
Informed patient. Today she went to exercise class and did all of it while seated. Gets tired walking. Encouraged her to start with walking to mailbox and back 3 times a day. She will do this and check blood pressure after the activity. F/u appt made.

## 2016-01-05 NOTE — Telephone Encounter (Signed)
Follow Up:     Returning call,says she was not sure it was you that called.

## 2016-01-06 ENCOUNTER — Other Ambulatory Visit (HOSPITAL_COMMUNITY): Payer: Self-pay

## 2016-01-07 DIAGNOSIS — T23002A Burn of unspecified degree of left hand, unspecified site, initial encounter: Secondary | ICD-10-CM | POA: Diagnosis not present

## 2016-01-12 DIAGNOSIS — M47816 Spondylosis without myelopathy or radiculopathy, lumbar region: Secondary | ICD-10-CM | POA: Diagnosis not present

## 2016-01-12 DIAGNOSIS — L84 Corns and callosities: Secondary | ICD-10-CM | POA: Diagnosis not present

## 2016-01-12 DIAGNOSIS — L602 Onychogryphosis: Secondary | ICD-10-CM | POA: Diagnosis not present

## 2016-01-24 ENCOUNTER — Other Ambulatory Visit: Payer: Self-pay | Admitting: Family Medicine

## 2016-02-10 DIAGNOSIS — M160 Bilateral primary osteoarthritis of hip: Secondary | ICD-10-CM | POA: Diagnosis not present

## 2016-02-11 DIAGNOSIS — M1711 Unilateral primary osteoarthritis, right knee: Secondary | ICD-10-CM | POA: Diagnosis not present

## 2016-02-11 DIAGNOSIS — G8929 Other chronic pain: Secondary | ICD-10-CM | POA: Diagnosis not present

## 2016-02-11 DIAGNOSIS — M25561 Pain in right knee: Secondary | ICD-10-CM | POA: Diagnosis not present

## 2016-02-24 DIAGNOSIS — M1711 Unilateral primary osteoarthritis, right knee: Secondary | ICD-10-CM | POA: Diagnosis not present

## 2016-02-24 DIAGNOSIS — G8929 Other chronic pain: Secondary | ICD-10-CM | POA: Diagnosis not present

## 2016-02-24 DIAGNOSIS — M25561 Pain in right knee: Secondary | ICD-10-CM | POA: Diagnosis not present

## 2016-02-24 DIAGNOSIS — M7050 Other bursitis of knee, unspecified knee: Secondary | ICD-10-CM | POA: Diagnosis not present

## 2016-02-28 ENCOUNTER — Other Ambulatory Visit: Payer: Self-pay | Admitting: Internal Medicine

## 2016-02-28 DIAGNOSIS — M25561 Pain in right knee: Secondary | ICD-10-CM | POA: Diagnosis not present

## 2016-02-28 DIAGNOSIS — M7051 Other bursitis of knee, right knee: Secondary | ICD-10-CM | POA: Diagnosis not present

## 2016-03-02 DIAGNOSIS — M25561 Pain in right knee: Secondary | ICD-10-CM | POA: Diagnosis not present

## 2016-03-02 DIAGNOSIS — M7051 Other bursitis of knee, right knee: Secondary | ICD-10-CM | POA: Diagnosis not present

## 2016-03-06 DIAGNOSIS — M25561 Pain in right knee: Secondary | ICD-10-CM | POA: Diagnosis not present

## 2016-03-06 DIAGNOSIS — M7051 Other bursitis of knee, right knee: Secondary | ICD-10-CM | POA: Diagnosis not present

## 2016-03-08 DIAGNOSIS — M25561 Pain in right knee: Secondary | ICD-10-CM | POA: Diagnosis not present

## 2016-03-08 DIAGNOSIS — M7051 Other bursitis of knee, right knee: Secondary | ICD-10-CM | POA: Diagnosis not present

## 2016-03-09 ENCOUNTER — Encounter: Payer: Self-pay | Admitting: Family Medicine

## 2016-03-09 ENCOUNTER — Ambulatory Visit (INDEPENDENT_AMBULATORY_CARE_PROVIDER_SITE_OTHER): Payer: Medicare Other | Admitting: Family Medicine

## 2016-03-09 ENCOUNTER — Encounter: Payer: Self-pay | Admitting: *Deleted

## 2016-03-09 VITALS — BP 131/72 | HR 73 | Temp 98.1°F | Resp 16 | Ht 61.5 in | Wt 169.2 lb

## 2016-03-09 DIAGNOSIS — I10 Essential (primary) hypertension: Secondary | ICD-10-CM | POA: Diagnosis not present

## 2016-03-09 DIAGNOSIS — E7800 Pure hypercholesterolemia, unspecified: Secondary | ICD-10-CM

## 2016-03-09 DIAGNOSIS — E118 Type 2 diabetes mellitus with unspecified complications: Secondary | ICD-10-CM

## 2016-03-09 LAB — COMPREHENSIVE METABOLIC PANEL
ALK PHOS: 81 U/L (ref 39–117)
ALT: 22 U/L (ref 0–35)
AST: 16 U/L (ref 0–37)
Albumin: 4.3 g/dL (ref 3.5–5.2)
BILIRUBIN TOTAL: 0.4 mg/dL (ref 0.2–1.2)
BUN: 19 mg/dL (ref 6–23)
CO2: 31 meq/L (ref 19–32)
CREATININE: 0.65 mg/dL (ref 0.40–1.20)
Calcium: 9.3 mg/dL (ref 8.4–10.5)
Chloride: 101 mEq/L (ref 96–112)
GFR: 93.2 mL/min (ref >60.00)
GLUCOSE: 105 mg/dL — AB (ref 70–99)
Potassium: 4.3 mEq/L (ref 3.5–5.1)
SODIUM: 140 meq/L (ref 135–145)
TOTAL PROTEIN: 6.4 g/dL (ref 6.0–8.3)

## 2016-03-09 LAB — HEMOGLOBIN A1C: Hgb A1c MFr Bld: 6.4 % (ref 4.6–6.5)

## 2016-03-09 MED ORDER — ESTRADIOL 1 MG PO TABS: 1.0000 mg | ORAL_TABLET | Freq: Every day | ORAL | 3 refills | Status: DC

## 2016-03-09 MED ORDER — METOPROLOL TARTRATE 50 MG PO TABS: ORAL_TABLET | ORAL | 3 refills | Status: DC

## 2016-03-09 NOTE — Progress Notes (Signed)
OFFICE VISIT  03/09/2016   CC:  Chief Complaint  Patient presents with  . Follow-up    RCI, pt is fasting.      HPI:    Patient is a 82 y.o. Caucasian female who presents for 4 mo f/u DM 2, HTN, HLD.  DM: fasting 115-125  HTN: home monitoring "doing pretty good".   Says stamina not good with walking.  Getting PT for R knee but says it is hurting her LB.  Chol: takes lipitor daily w/out side effect.  Hypoth: takes levothyroxine daily, no probs.  FEET: no burning or tingling.  Has numbness on tops chronically, but not bottoms.  Past Medical History:  Diagnosis Date  . Abdominal pain, right lower quadrant   . Anxiety   . Bursitis   . Cataracts, both eyes    soon to have cataract surg as of 03/2015  . Depression   . 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 Diverticulosis of colon    noted on colonoscopy 2008  . Dyspnea    a. 09/2011 CTA Chest: No PE, Ca2+ cors;  b. 09/2011 R&L heart Cath: Relatively nl R heart pressures, nl co/ci, nonobs cad, nl EF.  As of 12/2015 f/u with Dr. Harrington Challenger, plans for labs, echo, and lexiscan myoview.  . External hemorrhoid   . Former smoker    30 pack-yrs, quit 2005  . History of adenomatous polyp of colon   . Hot flashes    restarted estradiol 01/2015  . Hyperlipidemia   . Hypertension   . Hypothyroidism   . IBS (irritable bowel syndrome)   . Interstitial cystitis   . Liver function study, abnormal   . Lumbar degenerative disc disease    Facet-mediated pain per Dr. Nelva Bush.  Has gotten repeated LB injections, most recently April 2016 in [place].  . Menopausal syndrome   . Morton's neuroma   . Obesity, unspecified   . Other specified gastritis without mention of hemorrhage    hx of antral gastritis on EGD 05/2009  . Peripheral neuropathy ([place])    per EMG 11/2012.  Intolerant of gabapentin, failed pamelor.  Radiofrequency neurotomy 12/11/14 helped LB pain (Dr. Nelva Bush)  . Recurrent UTI    on daily antibiotic prophylaxis by her  urologist.  . RLS (restless legs syndrome)   . Tarsal tunnel syndrome   . Unspecified venous (peripheral) insufficiency     Past Surgical History:  Procedure Laterality Date  . ANKLE SURGERY     bilateral  . CARDIAC CATHETERIZATION  09/2011   Nonobstructive CAD  . COLONOSCOPY  2002/2005, 11/14/06, and 05/2009   polyps 2002 but none 2005 or 2008 or 2011.  Recall recommended 7-10 yrs  . ESOPHAGOGASTRODUODENOSCOPY  05/2009   Moderate antral gastritis  . FOOT SURGERY     R foot bunionectomy and arthroplasty of digits R foot.  Arthroplasty digits 3 and 4 L foot.  Marland Kitchen HAMMER TOE SURGERY     bilateral  . left tarsal tunnel release     sept '11 (Dr Beola Cord)  . radiofrequency neurotomy  06/18/2015   bilat L3-4 medial branch nerve and bilat L5 dorsal ramus nerve.(Dr. Nelva Bush)  . TONSILLECTOMY    . TOTAL ABDOMINAL HYSTERECTOMY W/ BILATERAL SALPINGOOPHORECTOMY    . TRANSTHORACIC ECHOCARDIOGRAM  08/2011; 2017   Grade I DD; same 2017    Outpatient Medications Prior to Visit  Medication Sig Dispense Refill  . ALPRAZolam (XANAX) 0.25 MG tablet Take 0.25 mg by mouth at bedtime as needed for  anxiety.    Marland Kitchen aspirin 81 MG tablet Take 81 mg by mouth daily.    Marland Kitchen atorvastatin (LIPITOR) 20 MG tablet TAKE ONE TABLET BY MOUTH DAILY 90 tablet 3  . blood glucose meter kit and supplies Check blood sugar once every morning. 1 each 0  . calcium carbonate (TUMS - DOSED IN MG ELEMENTAL CALCIUM) 500 MG chewable tablet Chew 2 tablets by mouth daily.    . cetirizine (ZYRTEC) 10 MG tablet Take 10 mg by mouth as needed for allergies.    . DULoxetine (CYMBALTA) 60 MG capsule TAKE ONE CAPSULE BY MOUTH DAILY 90 capsule 1  . HYDROcodone-acetaminophen (NORCO/VICODIN) 5-325 MG per tablet Take 1 tablet by mouth every 6 (six) hours as needed for moderate pain.    Marland Kitchen levothyroxine (SYNTHROID, LEVOTHROID) 25 MCG tablet TAKE ONE TABLET BY MOUTH DAILY 90 tablet 3  . loperamide (IMODIUM) 2 MG capsule Take 2 mg by mouth as needed for  diarrhea or loose stools. Reported on 05/10/2015    . losartan-hydrochlorothiazide (HYZAAR) 50-12.5 MG tablet TAKE ONE TABLET BY MOUTH DAILY 90 tablet 3  . LYRICA 50 MG capsule TAKE ONE CAPSULE BY MOUTH TWICE DAILY 60 capsule 5  . meloxicam (MOBIC) 15 MG tablet TAKE ONE TABLET BY MOUTH DAILY AS NEEDED FOR PAIN 90 tablet 3  . metFORMIN (GLUCOPHAGE) 1000 MG tablet TAKE ONE TABLET BY MOUTH TWICE DAILY 180 tablet 1  . mirtazapine (REMERON) 30 MG tablet TAKE 1/2 TO 1 TABLET BY MOUTH AT BEDTIME FOR pruitis 90 tablet 3  . omeprazole (PRILOSEC) 20 MG capsule TAKE ONE CAPSULE BY MOUTH DAILY 90 capsule 3  . OVER THE COUNTER MEDICATION Eye Vitamin - Areds2    . oxybutynin (DITROPAN-XL) 10 MG 24 hr tablet TAKE ONE TABLET BY MOUTH DAILY 90 tablet 3  . potassium chloride SA (K-DUR,KLOR-CON) 20 MEQ tablet TAKE ONE TABLET BY MOUTH DAILY 90 tablet 3  . Probiotic Product (ALIGN) 4 MG CAPS Take 1 capsule (4 mg total) by mouth daily. 30 capsule 2  . estradiol (ESTRACE) 1 MG tablet TAKE ONE TABLET BY MOUTH DAILY 30 tablet 3  . metoprolol (LOPRESSOR) 50 MG tablet Take 1 tablet (50 mg total) by mouth 2 (two) times daily. 180 tablet 3  . DULoxetine (CYMBALTA) 60 MG capsule Take 60 mg by mouth daily.  1   No facility-administered medications prior to visit.     Allergies  Allergen Reactions  . Gabapentin Itching  . Sulfonamide Derivatives     REACTION: Nausea    ROS As per HPI  PE: Blood pressure 131/72, pulse 73, temperature 98.1 F (36.7 C), temperature source Oral, resp. rate 16, height 5' 1.5" (1.562 m), weight 169 lb 4 oz (76.8 kg), SpO2 96 %. Gen: Alert, well appearing.  Patient is oriented to person, place, time, and situation. AFFECT: pleasant, lucid thought and speech. Foot exam -  no swelling, tenderness or skin or vascular lesions. Color and temperature is normal. Sensation is diminished to monofilament testing bilat, L worse than R. Peripheral pulses are palpable. Toenails are normal.   LABS:   Lab Results  Component Value Date   TSH 1.99 12/27/2015   Lab Results  Component Value Date   WBC 8.1 12/27/2015   HGB 11.5 (L) 12/27/2015   HCT 33.8 (L) 12/27/2015   MCV 90.4 12/27/2015   PLT 350 12/27/2015   Lab Results  Component Value Date   CREATININE 0.69 11/08/2015   BUN 16 11/08/2015   NA 139 11/08/2015  K 4.3 11/08/2015   CL 101 11/08/2015   CO2 26 11/08/2015   Lab Results  Component Value Date   ALT 18 07/02/2015   AST 18 07/02/2015   ALKPHOS 81 07/02/2015   BILITOT 0.4 07/02/2015   Lab Results  Component Value Date   CHOL 147 12/27/2015   Lab Results  Component Value Date   HDL 45 (L) 12/27/2015   Lab Results  Component Value Date   LDLCALC 46 12/27/2015   Lab Results  Component Value Date   TRIG 282 (H) 12/27/2015   Lab Results  Component Value Date   CHOLHDL 3.3 12/27/2015   Lab Results  Component Value Date   HGBA1C 6.0 11/08/2015    IMPRESSION AND PLAN:  1) DM 2; good control. She has evidence of DPN on feet exam today. She sees a podiatrist regularly. HbA1c today.  2) HTN; The current medical regimen is effective;  continue present plan and medications. Lytes/cr today.  3) HLD: tolerating statin.  Recent lipids good: done by her cardiologist, Dr. Harrington Challenger.  4) Hypothyroidism; recent TSH monitoring normal.  An After Visit Summary was printed and given to the patient.  FOLLOW UP: Return in about 4 months (around 07/08/2016) for routine chronic illness f/u.  Signed:  Crissie Sickles, MD           03/09/2016

## 2016-03-09 NOTE — Progress Notes (Signed)
Pre visit review using our clinic review tool, if applicable. No additional management support is needed unless otherwise documented below in the visit note. 

## 2016-03-10 DIAGNOSIS — M7051 Other bursitis of knee, right knee: Secondary | ICD-10-CM | POA: Diagnosis not present

## 2016-03-10 DIAGNOSIS — M25561 Pain in right knee: Secondary | ICD-10-CM | POA: Diagnosis not present

## 2016-03-28 ENCOUNTER — Other Ambulatory Visit: Payer: Self-pay | Admitting: Family Medicine

## 2016-03-28 ENCOUNTER — Other Ambulatory Visit: Payer: Self-pay

## 2016-03-28 MED ORDER — LOSARTAN POTASSIUM-HCTZ 50-12.5 MG PO TABS: 1.0000 | ORAL_TABLET | Freq: Every day | ORAL | 3 refills | Status: DC

## 2016-03-28 NOTE — Telephone Encounter (Signed)
Refill sent.

## 2016-03-30 ENCOUNTER — Ambulatory Visit (INDEPENDENT_AMBULATORY_CARE_PROVIDER_SITE_OTHER): Payer: Medicare Other | Admitting: Physician Assistant

## 2016-03-30 ENCOUNTER — Encounter: Payer: Self-pay | Admitting: Physician Assistant

## 2016-03-30 VITALS — BP 118/62 | HR 79 | Temp 97.9°F | Resp 16 | Ht 61.5 in | Wt 172.0 lb

## 2016-03-30 DIAGNOSIS — J209 Acute bronchitis, unspecified: Secondary | ICD-10-CM

## 2016-03-30 MED ORDER — BENZONATATE 100 MG PO CAPS: 100.0000 mg | ORAL_CAPSULE | Freq: Three times a day (TID) | ORAL | 0 refills | Status: DC | PRN

## 2016-03-30 MED ORDER — DOXYCYCLINE HYCLATE 100 MG PO CAPS: 100.0000 mg | ORAL_CAPSULE | Freq: Two times a day (BID) | ORAL | 0 refills | Status: DC

## 2016-03-30 MED ORDER — ALBUTEROL SULFATE HFA 108 (90 BASE) MCG/ACT IN AERS: 1.0000 | INHALATION_SPRAY | Freq: Four times a day (QID) | RESPIRATORY_TRACT | 0 refills | Status: DC | PRN

## 2016-03-30 MED ORDER — ALBUTEROL SULFATE (2.5 MG/3ML) 0.083% IN NEBU
2.5000 mg | INHALATION_SOLUTION | Freq: Once | RESPIRATORY_TRACT | Status: AC
  Administered 2016-03-30: 2.5 mg via RESPIRATORY_TRACT

## 2016-03-30 NOTE — Addendum Note (Signed)
Addended by: Leonidas Romberg on: 03/30/2016 11:01 AM   Modules accepted: Orders

## 2016-03-30 NOTE — Patient Instructions (Signed)
Take antibiotic (Doxycycline) as directed.  Increase fluids.  Get plenty of rest. Use Tessalon as directed. Albuterol as directed. Take a daily probiotic (I recommend Align or Culturelle, but even Activia Yogurt may be beneficial).  A humidifier placed in the bedroom may offer some relief for a dry, scratchy throat of nasal irritation.  Read information below on acute bronchitis. Please call or return to clinic if symptoms are not improving over the next few days.  Acute Bronchitis Bronchitis is when the airways that extend from the windpipe into the lungs get red, puffy, and painful (inflamed). Bronchitis often causes thick spit (mucus) to develop. This leads to a cough. A cough is the most common symptom of bronchitis. In acute bronchitis, the condition usually begins suddenly and goes away over time (usually in 2 weeks). Smoking, allergies, and asthma can make bronchitis worse. Repeated episodes of bronchitis may cause more lung problems.  HOME CARE  Rest.  Drink enough fluids to keep your pee (urine) clear or pale yellow (unless you need to limit fluids as told by your doctor).  Only take over-the-counter or prescription medicines as told by your doctor.  Avoid smoking and secondhand smoke. These can make bronchitis worse. If you are a smoker, think about using nicotine gum or skin patches. Quitting smoking will help your lungs heal faster.  Reduce the chance of getting bronchitis again by:  Washing your hands often.  Avoiding people with cold symptoms.  Trying not to touch your hands to your mouth, nose, or eyes.  Follow up with your doctor as told.  GET HELP IF: Your symptoms do not improve after 1 week of treatment. Symptoms include:  Cough.  Fever.  Coughing up thick spit.  Body aches.  Chest congestion.  Chills.  Shortness of breath.  Sore throat.  GET HELP RIGHT AWAY IF:   You have an increased fever.  You have chills.  You have severe shortness of  breath.  You have bloody thick spit (sputum).  You throw up (vomit) often.  You lose too much body fluid (dehydration).  You have a severe headache.  You faint.  MAKE SURE YOU:   Understand these instructions.  Will watch your condition.  Will get help right away if you are not doing well or get worse. Document Released: 08/30/2007 Document Revised: 11/13/2012 Document Reviewed: 09/03/2012 [facility] Patient Information 2015 [place], [place]. This information is not intended to replace advice given to you by your health care provider. Make sure you discuss any questions you have with your health care provider.

## 2016-03-30 NOTE — Progress Notes (Signed)
Pre visit review using our clinic review tool, if applicable. No additional management support is needed unless otherwise documented below in the visit note. 

## 2016-03-30 NOTE — Progress Notes (Addendum)
Patient presents to clinic today c/o 5 days of head congestion with some PND and cough that was mostly dry. Endorses acute worsening of symptoms over the past 2 days with cough that is now productive and increased chest congestion. Denies fever, chest pain. Notes sore throat and R ear pain. Notes some chest tightness and wheezing. Denies sinus pain or facial pain. Has taken Robitussin and Coricidin for symptoms relief. Has DM II but well controlled with last A1C at 6.4.   Past Medical History:  Diagnosis Date  . Abdominal pain, right lower quadrant   . Anxiety   . Bursitis   . Cataracts, both eyes    soon to have cataract surg as of 03/2015  . Depression   . 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 Diverticulosis of colon    noted on colonoscopy 2008  . Dyspnea    a. 09/2011 CTA Chest: No PE, Ca2+ cors;  b. 09/2011 R&L heart Cath: Relatively nl R heart pressures, nl co/ci, nonobs cad, nl EF.  As of 12/2015 f/u with Dr. Harrington Challenger, plans for labs, echo, and lexiscan myoview.  . External hemorrhoid   . Former smoker    30 pack-yrs, quit 2005  . History of adenomatous polyp of colon   . Hot flashes    restarted estradiol 01/2015  . Hyperlipidemia   . Hypertension   . Hypothyroidism   . IBS (irritable bowel syndrome)   . Interstitial cystitis   . Liver function study, abnormal   . Lumbar degenerative disc disease    Facet-mediated pain per Dr. Nelva Bush.  Has gotten repeated LB injections, most recently April 2016 in [place].  . Menopausal syndrome   . Morton's neuroma   . Obesity, unspecified   . Other specified gastritis without mention of hemorrhage    hx of antral gastritis on EGD 05/2009  . Peripheral neuropathy ([place])    per EMG 11/2012.  Intolerant of gabapentin, failed pamelor.  Radiofrequency neurotomy 12/11/14 helped LB pain (Dr. Nelva Bush)  . Recurrent UTI    on daily antibiotic prophylaxis by her urologist.  . RLS (restless legs syndrome)   . Tarsal tunnel syndrome   .  Unspecified venous (peripheral) insufficiency     Current Outpatient Prescriptions on File Prior to Visit  Medication Sig Dispense Refill  . ALPRAZolam (XANAX) 0.25 MG tablet Take 0.25 mg by mouth at bedtime as needed for anxiety.    Marland Kitchen aspirin 81 MG tablet Take 81 mg by mouth daily.    Marland Kitchen atorvastatin (LIPITOR) 20 MG tablet TAKE ONE TABLET BY MOUTH DAILY 90 tablet 3  . blood glucose meter kit and supplies Check blood sugar once every morning. 1 each 0  . calcium carbonate (TUMS - DOSED IN MG ELEMENTAL CALCIUM) 500 MG chewable tablet Chew 2 tablets by mouth daily.    . cetirizine (ZYRTEC) 10 MG tablet Take 10 mg by mouth as needed for allergies.    . ciprofloxacin (CIPRO) 250 MG tablet Take 125 mg by mouth at bedtime.  0  . DULoxetine (CYMBALTA) 60 MG capsule TAKE ONE CAPSULE BY MOUTH DAILY 90 capsule 1  . estradiol (ESTRACE) 1 MG tablet Take 1 tablet (1 mg total) by mouth daily. 90 tablet 3  . HYDROcodone-acetaminophen (NORCO/VICODIN) 5-325 MG per tablet Take 1 tablet by mouth every 6 (six) hours as needed for moderate pain.    Marland Kitchen levothyroxine (SYNTHROID, LEVOTHROID) 25 MCG tablet TAKE ONE TABLET BY MOUTH DAILY 90 tablet 3  . loperamide (  IMODIUM) 2 MG capsule Take 2 mg by mouth as needed for diarrhea or loose stools. Reported on 05/10/2015    . losartan-hydrochlorothiazide (HYZAAR) 50-12.5 MG tablet Take 1 tablet by mouth daily. 90 tablet 3  . LYRICA 50 MG capsule TAKE ONE CAPSULE BY MOUTH TWICE DAILY 60 capsule 5  . meloxicam (MOBIC) 15 MG tablet TAKE ONE TABLET BY MOUTH DAILY AS NEEDED FOR PAIN 90 tablet 3  . metFORMIN (GLUCOPHAGE) 1000 MG tablet TAKE ONE TABLET BY MOUTH TWICE DAILY 180 tablet 1  . metoprolol (LOPRESSOR) 50 MG tablet Take 1 tablet (50 mg total) by mouth 2 (two) times daily. 180 tablet 3  . mirtazapine (REMERON) 30 MG tablet TAKE 1/2 TO 1 TABLET BY MOUTH AT BEDTIME FOR pruitis 90 tablet 3  . omeprazole (PRILOSEC) 20 MG capsule TAKE ONE CAPSULE BY MOUTH DAILY 90 capsule 3  .  OVER THE COUNTER MEDICATION Eye Vitamin - Areds2    . oxybutynin (DITROPAN-XL) 10 MG 24 hr tablet TAKE ONE TABLET BY MOUTH DAILY 90 tablet 3  . potassium chloride SA (K-DUR,KLOR-CON) 20 MEQ tablet TAKE ONE TABLET BY MOUTH DAILY 90 tablet 3  . Probiotic Product (ALIGN) 4 MG CAPS Take 1 capsule (4 mg total) by mouth daily. 30 capsule 2   No current facility-administered medications on file prior to visit.     Allergies  Allergen Reactions  . Gabapentin Itching  . Sulfonamide Derivatives     REACTION: Nausea    Family History  Problem Relation Age of Onset  . Cancer Mother     Breast  . Hypertension Father   . Hyperlipidemia Father   . Diabetes Maternal Grandfather   . Colon cancer      Social History   Social History  . Marital status: Married    Spouse name: N/A  . Number of children: N/A  . Years of education: N/A   Social History Main Topics  . Smoking status: Former Smoker    Packs/day: 1.00    Years: 35.00    Types: Cigarettes    Quit date: 03/27/2002  . Smokeless tobacco: Former Systems developer    Quit date: 07/01/2005  . Alcohol use No  . Drug use: No  . Sexual activity: Not Asked   Other Topics Concern  . None   Social History Narrative   Married '57   3 sons- '60, '61, '64, grandchildren 48 (7 girls, 1 boy)   Lives independently with husband   Patient is a former smoker: quit 2004.   Alcohol use- yes socially not in years.   Illicit Drug use- no   Occupation: Biomedical scientist    Review of Systems - See HPI.  All other ROS are negative.  BP 118/62   Pulse 79   Temp 97.9 F (36.6 C) (Oral)   Resp 16   Ht 5' 1.5" (1.562 m)   Wt 172 lb (78 kg)   SpO2 97%   BMI 31.97 kg/m   Physical Exam  Constitutional: She is oriented to person, place, and time and well-developed, well-nourished, and in no distress.  HENT:  Head: Normocephalic and atraumatic.  Right Ear: External ear normal.  Left Ear: External ear normal.  Nose: Nose normal.  Mouth/Throat:  Oropharynx is clear and moist. No oropharyngeal exudate.  Eyes: Conjunctivae are normal.  Neck: Neck supple.  Cardiovascular: Normal rate, regular rhythm, normal heart sounds and intact distal pulses.   Pulmonary/Chest: Effort normal. No respiratory distress. She exhibits no tenderness.  Faint wheeze  of lung bases bilaterally.   Neurological: She is alert and oriented to person, place, and time.  Skin: Skin is warm and dry. No rash noted.  Psychiatric: Affect normal.  Vitals reviewed.  Recent Results (from the past 2160 hour(s))  ECHOCARDIOGRAM COMPLETE     Status: None   Collection Time: 01/04/16 11:17 AM  Result Value Ref Range   Weight 2,704 oz   Height 62.000 in  Myocardial Perfusion Imaging     Status: None   Collection Time: 01/04/16  1:17 PM  Result Value Ref Range   Rest HR 68 bpm   Rest BP 121/62 mmHg   Peak HR 83 bpm   Peak BP 122/61 mmHg   SSS 8    SRS 8    SDS 4    LHR 0.33    TID 0.82    LV sys vol 10 mL   LV dias vol 56 46 - 106 mL  Comprehensive metabolic panel     Status: Abnormal   Collection Time: 03/09/16 10:02 AM  Result Value Ref Range   Sodium 140 135 - 145 mEq/L   Potassium 4.3 3.5 - 5.1 mEq/L   Chloride 101 96 - 112 mEq/L   CO2 31 19 - 32 mEq/L   Glucose, Bld 105 (H) 70 - 99 mg/dL   BUN 19 6 - 23 mg/dL   Creatinine, Ser 0.65 0.40 - 1.20 mg/dL   Total Bilirubin 0.4 0.2 - 1.2 mg/dL   Alkaline Phosphatase 81 39 - 117 U/L   AST 16 0 - 37 U/L   ALT 22 0 - 35 U/L   Total Protein 6.4 6.0 - 8.3 g/dL   Albumin 4.3 3.5 - 5.2 g/dL   Calcium 9.3 8.4 - 10.5 mg/dL   GFR 93.20 >60.00 mL/min  Hemoglobin A1c     Status: None   Collection Time: 03/09/16 10:02 AM  Result Value Ref Range   Hgb A1c MFr Bld 6.4 4.6 - 6.5 %    Comment: Glycemic Control Guidelines for People with Diabetes:Non Diabetic:  <6%Goal of Therapy: <7%Additional Action Suggested:  >8%    Assessment/Plan: 1. Acute bronchitis, unspecified organism Change in sputum. O2 at 93% on RA.  Albuterol neb given with increase in 02 to 97% RA. HR stable. Afebrile. Lungs with mild wheeze resolving with neb. Otherwise CTAB. Will start Doxycycline. Tessalon for cough. Rx Proair. Supportive measures reviewed. FU if not improving.   Patient on Cipro 125 daily for UTI prevention by Urology. Will stop while on treatment for bronchitis.   Leeanne Rio, PA-C

## 2016-04-03 ENCOUNTER — Other Ambulatory Visit: Payer: Self-pay | Admitting: Family Medicine

## 2016-04-05 DIAGNOSIS — G8929 Other chronic pain: Secondary | ICD-10-CM | POA: Diagnosis not present

## 2016-04-05 DIAGNOSIS — L602 Onychogryphosis: Secondary | ICD-10-CM | POA: Diagnosis not present

## 2016-04-05 DIAGNOSIS — M25561 Pain in right knee: Secondary | ICD-10-CM | POA: Diagnosis not present

## 2016-04-05 DIAGNOSIS — E1351 Other specified diabetes mellitus with diabetic peripheral angiopathy without gangrene: Secondary | ICD-10-CM | POA: Diagnosis not present

## 2016-04-05 DIAGNOSIS — M1711 Unilateral primary osteoarthritis, right knee: Secondary | ICD-10-CM | POA: Diagnosis not present

## 2016-04-06 ENCOUNTER — Ambulatory Visit (INDEPENDENT_AMBULATORY_CARE_PROVIDER_SITE_OTHER): Payer: Medicare Other | Admitting: Internal Medicine

## 2016-04-06 ENCOUNTER — Encounter: Payer: Self-pay | Admitting: Internal Medicine

## 2016-04-06 VITALS — BP 138/70 | HR 89 | Ht 61.5 in | Wt 170.1 lb

## 2016-04-06 DIAGNOSIS — I25810 Atherosclerosis of coronary artery bypass graft(s) without angina pectoris: Secondary | ICD-10-CM

## 2016-04-06 DIAGNOSIS — R0600 Dyspnea, unspecified: Secondary | ICD-10-CM

## 2016-04-06 DIAGNOSIS — I519 Heart disease, unspecified: Secondary | ICD-10-CM | POA: Diagnosis not present

## 2016-04-06 DIAGNOSIS — I5189 Other ill-defined heart diseases: Secondary | ICD-10-CM

## 2016-04-06 NOTE — Progress Notes (Signed)
Cardiology Office Note   Date:  04/06/2016   ID:  Katherine Best, DOB 03-Nov-1933, MRN [id number]  PCP:  Tammi Sou, MD  Cardiologist:   Dorris Carnes, MD    F/U of SOB     History of Present Illness: Katherine Best is a 83 y.o. female with a history of mld CAD by cath  Normal R heart prssures  Mild diastolic dysfuncton  Also a history of DM  I saw her back in October   When I saw her she complained of increased DOE  I recomm an echo and lexiscan myovue  Myovue ws normal  Echo with normal systolic func  Gr II diastolic dysfunction   Recomm close f/u of BP    Lipdis in the fall LDL was 46  HDL was 45  Since seen she has had bronchitis  REcovering  Prior she says her breating is OK    Current Meds  Medication Sig  . albuterol (PROVENTIL HFA;VENTOLIN HFA) 108 (90 Base) MCG/ACT inhaler Inhale 1 puff into the lungs every 6 (six) hours as needed for wheezing or shortness of breath.  . ALPRAZolam (XANAX) 0.25 MG tablet Take 0.25 mg by mouth at bedtime as needed for anxiety.  Marland Kitchen aspirin 81 MG tablet Take 81 mg by mouth daily.  Marland Kitchen atorvastatin (LIPITOR) 20 MG tablet TAKE ONE TABLET BY MOUTH DAILY  . benzonatate (TESSALON) 100 MG capsule Take 1 capsule (100 mg total) by mouth 3 (three) times daily as needed for cough.  . blood glucose meter kit and supplies Check blood sugar once every morning.  Marland Kitchen CALCIUM PO Take 1 tablet by mouth daily.  . cetirizine (ZYRTEC) 10 MG tablet Take 10 mg by mouth as needed for allergies.  . DULoxetine (CYMBALTA) 60 MG capsule TAKE ONE CAPSULE BY MOUTH DAILY  . estradiol (ESTRACE) 1 MG tablet Take 1 tablet (1 mg total) by mouth daily.  Marland Kitchen HYDROcodone-acetaminophen (NORCO/VICODIN) 5-325 MG per tablet Take 1 tablet by mouth every 6 (six) hours as needed for moderate pain.  Marland Kitchen levothyroxine (SYNTHROID, LEVOTHROID) 25 MCG tablet TAKE ONE TABLET BY MOUTH DAILY  . loperamide (IMODIUM) 2 MG capsule Take 2 mg by mouth as needed for diarrhea or loose stools. Reported on  05/10/2015  . losartan-hydrochlorothiazide (HYZAAR) 50-12.5 MG tablet Take 1 tablet by mouth daily.  Marland Kitchen LYRICA 50 MG capsule TAKE ONE CAPSULE BY MOUTH TWICE DAILY  . meloxicam (MOBIC) 15 MG tablet TAKE ONE TABLET BY MOUTH DAILY AS NEEDED FOR PAIN  . metFORMIN (GLUCOPHAGE) 1000 MG tablet TAKE ONE TABLET BY MOUTH TWICE DAILY  . metoprolol (LOPRESSOR) 50 MG tablet Take 1 tablet (50 mg total) by mouth 2 (two) times daily.  . mirtazapine (REMERON) 30 MG tablet TAKE 1/2 TO 1 TABLET BY MOUTH AT BEDTIME FOR pruitis  . omeprazole (PRILOSEC) 20 MG capsule TAKE ONE CAPSULE BY MOUTH DAILY  . OVER THE COUNTER MEDICATION Eye Vitamin - Areds2  . oxybutynin (DITROPAN-XL) 10 MG 24 hr tablet TAKE ONE TABLET BY MOUTH DAILY  . potassium chloride SA (K-DUR,KLOR-CON) 20 MEQ tablet TAKE ONE TABLET BY MOUTH DAILY  . [DISCONTINUED] ciprofloxacin (CIPRO) 250 MG tablet Take 125 mg by mouth at bedtime.     Allergies:   Gabapentin and Sulfonamide derivatives   Past Medical History:  Diagnosis Date  . Abdominal pain, right lower quadrant   . Anxiety   . Bursitis   . Cataracts, both eyes    soon to have cataract surg as of  03/2015  . Depression   . 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 Diverticulosis of colon    noted on colonoscopy 2008  . Dyspnea    a. 09/2011 CTA Chest: No PE, Ca2+ cors;  b. 09/2011 R&L heart Cath: Relatively nl R heart pressures, nl co/ci, nonobs cad, nl EF.  As of 12/2015 f/u with Dr. Harrington Challenger, plans for labs, echo, and lexiscan myoview.  . External hemorrhoid   . Former smoker    30 pack-yrs, quit 2005  . History of adenomatous polyp of colon   . Hot flashes    restarted estradiol 01/2015  . Hyperlipidemia   . Hypertension   . Hypothyroidism   . IBS (irritable bowel syndrome)   . Interstitial cystitis   . Liver function study, abnormal   . Lumbar degenerative disc disease    Facet-mediated pain per Dr. Nelva Bush.  Has gotten repeated LB injections, most recently April 2016 in  [place].  . Menopausal syndrome   . Morton's neuroma   . Obesity, unspecified   . Other specified gastritis without mention of hemorrhage    hx of antral gastritis on EGD 05/2009  . Peripheral neuropathy ([place])    per EMG 11/2012.  Intolerant of gabapentin, failed pamelor.  Radiofrequency neurotomy 12/11/14 helped LB pain (Dr. Nelva Bush)  . Recurrent UTI    on daily antibiotic prophylaxis by her urologist.  . RLS (restless legs syndrome)   . Tarsal tunnel syndrome   . Unspecified venous (peripheral) insufficiency     Past Surgical History:  Procedure Laterality Date  . ANKLE SURGERY     bilateral  . CARDIAC CATHETERIZATION  09/2011   Nonobstructive CAD  . COLONOSCOPY  2002/2005, 11/14/06, and 05/2009   polyps 2002 but none 2005 or 2008 or 2011.  Recall recommended 7-10 yrs  . ESOPHAGOGASTRODUODENOSCOPY  05/2009   Moderate antral gastritis  . FOOT SURGERY     R foot bunionectomy and arthroplasty of digits R foot.  Arthroplasty digits 3 and 4 L foot.  Marland Kitchen HAMMER TOE SURGERY     bilateral  . left tarsal tunnel release     sept '11 (Dr Beola Cord)  . radiofrequency neurotomy  06/18/2015   bilat L3-4 medial branch nerve and bilat L5 dorsal ramus nerve.(Dr. Nelva Bush)  . TONSILLECTOMY    . TOTAL ABDOMINAL HYSTERECTOMY W/ BILATERAL SALPINGOOPHORECTOMY    . TRANSTHORACIC ECHOCARDIOGRAM  08/2011; 2017   Grade I DD; same 2017     Social History:  The patient  reports that she quit smoking about 14 years ago. Her smoking use included Cigarettes. She has a 35.00 pack-year smoking history. She quit smokeless tobacco use about 10 years ago. She reports that she does not drink alcohol or use drugs.   Family History:  The patient's family history includes Cancer in her mother; Diabetes in her maternal grandfather; Hyperlipidemia in her father; Hypertension in her father.    ROS:  Please see the history of present illness. All other systems are reviewed and  Negative to the above problem except as noted.     PHYSICAL EXAM: VS:  BP 138/70   Pulse 89   Ht 5' 1.5" (1.562 m)   Wt 170 lb 1.9 oz (77.2 kg)   SpO2 97%   BMI 31.62 kg/m   GEN: Well nourished, well developed, in no acute distress  HEENT: normal  Neck: no JVD, carotid bruits, or masses Cardiac: RRR; no murmurs, rubs, or gallops,no edema  Respiratory:  clear to auscultation  bilaterally, normal work of breathing GI: soft, nontender, nondistended, + BS  No hepatomegaly  MS: no deformity Moving all extremities   Skin: warm and dry, no rash Neuro:  Strength and sensation are intact Psych: euthymic mood, full affect   EKG:  EKG is not  ordered today.   Lipid Panel    Component Value Date/Time   CHOL 147 12/27/2015 1123   TRIG 282 (H) 12/27/2015 1123   HDL 45 (L) 12/27/2015 1123   CHOLHDL 3.3 12/27/2015 1123   VLDL 56 (H) 12/27/2015 1123   LDLCALC 46 12/27/2015 1123   LDLDIRECT 76.0 01/18/2015 0826      Wt Readings from Last 3 Encounters:  04/06/16 170 lb 1.9 oz (77.2 kg)  03/30/16 172 lb (78 kg)  03/09/16 169 lb 4 oz (76.8 kg)      ASSESSMENT AND PLAN:  1  Dyspnea/diastolic dysfunction   Volume status appears OK  I would keep on same regimen  2  CAD MIld at cath  Aysmptpmatic  3  HL  Excellent control  F/U laterd this year   Current medicines are reviewed at length with the patient today.  The patient does not have concerns regarding medicines.  Signed, Dorris Carnes, MD  04/06/2016 8:52 AM    [address], [place], [place]  [address] Phone: [phone]; Fax: [phone]

## 2016-04-06 NOTE — Patient Instructions (Signed)
Your physician recommends that you continue on your current medications as directed. Please refer to the Current Medication list given to you today.  Your physician wants you to follow-up NOV/DEC 2018 WITH DR. Harrington Challenger.    You will receive a reminder letter in the mail two months in advance. If you don't receive a letter, please call our office to schedule the follow-up appointment.

## 2016-04-09 ENCOUNTER — Encounter: Payer: Self-pay | Admitting: Family Medicine

## 2016-04-17 ENCOUNTER — Other Ambulatory Visit: Payer: Self-pay | Admitting: Family Medicine

## 2016-04-19 DIAGNOSIS — H353123 Nonexudative age-related macular degeneration, left eye, advanced atrophic without subfoveal involvement: Secondary | ICD-10-CM | POA: Diagnosis not present

## 2016-04-19 DIAGNOSIS — H35373 Puckering of macula, bilateral: Secondary | ICD-10-CM | POA: Diagnosis not present

## 2016-04-19 DIAGNOSIS — H353113 Nonexudative age-related macular degeneration, right eye, advanced atrophic without subfoveal involvement: Secondary | ICD-10-CM | POA: Diagnosis not present

## 2016-04-19 DIAGNOSIS — H35372 Puckering of macula, left eye: Secondary | ICD-10-CM | POA: Diagnosis not present

## 2016-04-19 DIAGNOSIS — E119 Type 2 diabetes mellitus without complications: Secondary | ICD-10-CM | POA: Diagnosis not present

## 2016-04-19 DIAGNOSIS — H35371 Puckering of macula, right eye: Secondary | ICD-10-CM | POA: Diagnosis not present

## 2016-04-19 DIAGNOSIS — H353133 Nonexudative age-related macular degeneration, bilateral, advanced atrophic without subfoveal involvement: Secondary | ICD-10-CM | POA: Diagnosis not present

## 2016-04-19 DIAGNOSIS — H43812 Vitreous degeneration, left eye: Secondary | ICD-10-CM | POA: Diagnosis not present

## 2016-04-19 LAB — HM DIABETES EYE EXAM

## 2016-05-12 ENCOUNTER — Encounter: Payer: Self-pay | Admitting: Family Medicine

## 2016-05-17 DIAGNOSIS — H16212 Exposure keratoconjunctivitis, left eye: Secondary | ICD-10-CM | POA: Diagnosis not present

## 2016-05-18 DIAGNOSIS — M160 Bilateral primary osteoarthritis of hip: Secondary | ICD-10-CM | POA: Diagnosis not present

## 2016-06-07 ENCOUNTER — Other Ambulatory Visit: Payer: Self-pay | Admitting: Family Medicine

## 2016-06-12 ENCOUNTER — Other Ambulatory Visit: Payer: Self-pay | Admitting: Family Medicine

## 2016-06-16 ENCOUNTER — Telehealth: Payer: Self-pay

## 2016-06-16 NOTE — Telephone Encounter (Signed)
LM requesting CB regarding AWV. Requesting patient to have AWV with [facility] on 07/10/16 @ 1130 after appointment with PCP.

## 2016-06-20 ENCOUNTER — Other Ambulatory Visit: Payer: Self-pay | Admitting: Dermatology

## 2016-06-20 DIAGNOSIS — D492 Neoplasm of unspecified behavior of bone, soft tissue, and skin: Secondary | ICD-10-CM | POA: Diagnosis not present

## 2016-06-20 DIAGNOSIS — L570 Actinic keratosis: Secondary | ICD-10-CM | POA: Diagnosis not present

## 2016-06-20 DIAGNOSIS — L820 Inflamed seborrheic keratosis: Secondary | ICD-10-CM | POA: Diagnosis not present

## 2016-06-20 DIAGNOSIS — I872 Venous insufficiency (chronic) (peripheral): Secondary | ICD-10-CM | POA: Diagnosis not present

## 2016-06-21 DIAGNOSIS — M47816 Spondylosis without myelopathy or radiculopathy, lumbar region: Secondary | ICD-10-CM | POA: Diagnosis not present

## 2016-06-28 DIAGNOSIS — L602 Onychogryphosis: Secondary | ICD-10-CM | POA: Diagnosis not present

## 2016-06-28 DIAGNOSIS — E1351 Other specified diabetes mellitus with diabetic peripheral angiopathy without gangrene: Secondary | ICD-10-CM | POA: Diagnosis not present

## 2016-07-07 DIAGNOSIS — M1711 Unilateral primary osteoarthritis, right knee: Secondary | ICD-10-CM | POA: Diagnosis not present

## 2016-07-07 DIAGNOSIS — M1712 Unilateral primary osteoarthritis, left knee: Secondary | ICD-10-CM | POA: Diagnosis not present

## 2016-07-07 NOTE — Progress Notes (Signed)
Subjective:   Katherine Best is a 83 y.o. female who presents for Medicare Annual (Subsequent) preventive examination.  Review of Systems:  No ROS.  Medicare Wellness Visit.  Cardiac Risk Factors include: advanced age (>10mn, >>31women);diabetes mellitus;dyslipidemia;family history of premature cardiovascular disease;hypertension;obesity (BMI >30kg/m2);sedentary lifestyle   Sleep patterns: Sleeps about 8-9 hours, feels rested. Up to void x 1.  Home Safety/Smoke Alarms: Smoke detectors and security in place.   Living environment; residence and Firearm Safety: Lives with husband in 1 story, feels safe.  Seat Belt Safety/Bike Helmet: Wears seat belt.   Counseling:   Eye Exam-Last exam 04/2016, [facility]-Last exam 03/2016, routinely for implants.   Female:   Pap-N/A       Mammo-10/29/2015, negative.        Dexa scan-Patient reports 2016. Order placed. Will schedule with mammogram.         CCS-Colonoscopy 07/30/2003, polyp. No recall.        Objective:     Vitals: BP 124/70 (BP Location: Left Arm, Patient Position: Sitting, Cuff Size: Normal)   Pulse 74   Temp 97.4 F (36.3 C)   Resp 18   Ht _0  (1.575 m)   Wt 176 lb 6.4 oz (80 kg)   SpO2 96%   BMI 32.26 kg/m   Body mass index is 32.26 kg/m.   Tobacco History  Smoking Status  . Former Smoker  . Packs/day: 1.00  . Years: 35.00  . Types: Cigarettes  . Quit date: 03/27/2002  Smokeless Tobacco  . 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 Abdominal pain, right lower quadrant   . Anxiety   . Bursitis   . Cataracts, both eyes    soon to have cataract surg as of 03/2015  . Depression   . [place]   . Diverticulosis of colon    noted on colonoscopy 2008  . Dyspnea    a. 09/2011 CTA Chest: No PE, Ca2+ cors;  b. 09/2011 R&L heart Cath: Relatively nl R heart pressures, nl co/ci, nonobs cad, nl  EF.  12/2015 stress test normal, echo with grd II DD--likely explains DOE.  .Marland KitchenExternal hemorrhoid   . Former smoker    30 pack-yrs, quit 2005  . History of adenomatous polyp of colon   . Hot flashes    restarted estradiol 01/2015  . Hyperlipidemia   . Hypertension   . Hypothyroidism   . IBS (irritable bowel syndrome)   . Interstitial cystitis   . Liver function study, abnormal   . Lumbar degenerative disc disease    Facet-mediated pain per Dr. RNelva Bush  Has gotten [place] LB injections[place]  . Menopausal syndrome   . Morton's neuroma   . Obesity, unspecified   . Other specified gastritis without mention of hemorrhage    hx of antral gastritis on EGD 05/2009  . Peripheral neuropathy    per EMG 11/2012.  Intolerant of gabapentin, failed pamelor.  Radiofrequency neurotomy 12/11/14 helped LB pain (Dr. RNelva Bush  . Recurrent UTI    on daily antibiotic prophylaxis by her urologist.  . RLS (restless legs syndrome)   . Tarsal tunnel syndrome   . Unspecified venous (peripheral) insufficiency    Past Surgical History:  Procedure Laterality Date  . ANKLE SURGERY     bilateral  . CARDIAC CATHETERIZATION  09/2011  Nonobstructive CAD  . CARDIOVASCULAR STRESS TEST  01/04/2016   Nuclear medicine stress test: NORMAL  . CARDIOVASCULAR STRESS TEST  01/04/2016   Normal stress nuclear study with no ischemia or infarction; EF 82 with normal wall motion  . COLONOSCOPY  2002/2005, 11/14/06, and 05/2009   polyps 2002 but none 2005 or 2008 or 2011.  Recall recommended 7-10 yrs  . ESOPHAGOGASTRODUODENOSCOPY  05/2009   Moderate antral gastritis  . FOOT SURGERY     R foot bunionectomy and arthroplasty of digits R foot.  Arthroplasty digits 3 and 4 L foot.  Marland Kitchen HAMMER TOE SURGERY     bilateral  . left tarsal tunnel release     sept '11 (Dr Beola Cord)  . radiofrequency neurotomy  06/18/2015   bilat L3-4 medial branch nerve and bilat L5 dorsal ramus nerve.(Dr. Nelva Bush)  . TONSILLECTOMY     . TOTAL ABDOMINAL HYSTERECTOMY W/ BILATERAL SALPINGOOPHORECTOMY    . TRANSTHORACIC ECHOCARDIOGRAM  08/2011; 2017   2013 Grade I DD; 2017 EF 60-65%, normal wall motion, grd II DD.   Family History  Problem Relation Age of Onset  . Cancer Mother     Breast  . Hypertension Father   . Hyperlipidemia Father   . Diabetes Maternal Grandfather   . Colon cancer     History  Sexual Activity  . Sexual activity: Not on file    Outpatient Encounter Prescriptions as of 07/10/2016  Medication Sig  . albuterol (PROVENTIL HFA;VENTOLIN HFA) 108 (90 Base) MCG/ACT inhaler Inhale 1 puff into the lungs every 6 (six) hours as needed for wheezing or shortness of breath.  . ALPRAZolam (XANAX) 0.25 MG tablet Take 0.25 mg by mouth at bedtime as needed for anxiety.  Marland Kitchen aspirin 81 MG tablet Take 81 mg by mouth daily.  Marland Kitchen atorvastatin (LIPITOR) 20 MG tablet TAKE ONE TABLET BY MOUTH DAILY  . blood glucose meter kit and supplies Check blood sugar once every morning.  Marland Kitchen CALCIUM PO Take 1 tablet by mouth daily.  . cetirizine (ZYRTEC) 10 MG tablet Take 10 mg by mouth as needed for allergies.  . Cyanocobalamin (VITAMIN B-12 PO) Take by mouth.  . DULoxetine (CYMBALTA) 60 MG capsule TAKE ONE CAPSULE BY MOUTH DAILY  . estradiol (ESTRACE) 1 MG tablet Take 1 tablet (1 mg total) by mouth daily.  Marland Kitchen HYDROcodone-acetaminophen (NORCO/VICODIN) 5-325 MG per tablet Take 1 tablet by mouth every 6 (six) hours as needed for moderate pain.  Marland Kitchen levothyroxine (SYNTHROID, LEVOTHROID) 25 MCG tablet TAKE ONE TABLET BY MOUTH DAILY  . loperamide (IMODIUM) 2 MG capsule Take 2 mg by mouth as needed for diarrhea or loose stools. Reported on 05/10/2015  . losartan-hydrochlorothiazide (HYZAAR) 50-12.5 MG tablet Take 1 tablet by mouth daily.  Marland Kitchen LYRICA 50 MG capsule TAKE ONE CAPSULE BY MOUTH TWICE DAILY  . meloxicam (MOBIC) 15 MG tablet TAKE ONE TABLET BY MOUTH DAILY AS NEEDED FOR PAIN  . metFORMIN (GLUCOPHAGE) 1000 MG tablet TAKE ONE TABLET BY  MOUTH TWICE DAILY  . metoprolol (LOPRESSOR) 50 MG tablet Take 1 tablet (50 mg total) by mouth 2 (two) times daily.  . mirtazapine (REMERON) 30 MG tablet TAKE 1/2 TO 1 TABLET BY MOUTH AT BEDTIME FOR pruitis  . Multiple Vitamins-Minerals (MULTIVITAMIN ADULT PO) Take by mouth.  Marland Kitchen omeprazole (PRILOSEC) 20 MG capsule TAKE ONE CAPSULE BY MOUTH DAILY  . OVER THE COUNTER MEDICATION Eye Vitamin - Areds2  . potassium chloride SA (K-DUR,KLOR-CON) 20 MEQ tablet TAKE ONE TABLET BY MOUTH DAILY  .  benzonatate (TESSALON) 100 MG capsule Take 1 capsule (100 mg total) by mouth 3 (three) times daily as needed for cough. (Patient not taking: Reported on 07/10/2016)  . oxybutynin (DITROPAN-XL) 10 MG 24 hr tablet TAKE ONE TABLET BY MOUTH DAILY (Patient not taking: Reported on 07/10/2016)   No facility-administered encounter medications on file as of 07/10/2016.     Activities of Daily Living In your present state of health, do you have any difficulty performing the following activities: 07/10/2016 03/30/2016  Hearing? N N  Vision? N N  Difficulty concentrating or making decisions? N N  Walking or climbing stairs? Y N  Dressing or bathing? N N  Doing errands, shopping? N N  Preparing Food and eating ? N -  Using the Toilet? N -  In the past six months, have you accidently leaked urine? Y -  Do you have problems with loss of bowel control? N -  Managing your Medications? N -  Managing your Finances? N -  Housekeeping or managing your Housekeeping? N -  Some recent data might be hidden    Patient Care Team: Tammi Sou, MD as PCP - General (Family Medicine) Collene Gobble, MD (Pulmonary Disease) Fay Records, MD as Consulting Physician (Cardiology) Suella Broad, MD as Consulting Physician (Physical Medicine and Rehabilitation) Paralee Cancel, MD as Consulting Physician (Orthopedic Surgery) Kathie Rhodes, MD as Consulting Physician (Urology) Darleen Crocker, MD as Consulting Physician (Ophthalmology)      Assessment:    Physical assessment deferred to PCP.  Exercise Activities and Dietary recommendations Current Exercise Habits: The patient does not participate in regular exercise at present, Exercise limited by: orthopedic condition(s)   Uses electric wheelchair in stores while shopping, gets tired easily.   Diet (meal preparation, eat out, water intake, caffeinated beverages, dairy products, fruits and vegetables): Drinks soda and water.   Breakfast: cereal, muffin Lunch: sandwich Dinner: vegetable, chicken, bread  Discussed diabetic diet and increasing activity as tolerated.   Goals    . walk          Walk more by increasing water aerobic to increase stamina/strength.       Fall Risk Fall Risk  07/10/2016 07/02/2015 09/02/2014  Falls in the past year? Yes Yes No  Number falls in past yr: 1 2 or more -  Injury with Fall? No No -  Risk Factor Category  - High Fall Risk -  Risk for fall due to : - History of fall(s) -  Risk for fall due to (comments): - Her falls occur mainly when she gets up in night to use the bathroom.  Now uses a walker and this helps. -  Follow up Falls prevention discussed Falls prevention discussed -   Depression Screen PHQ 2/9 Scores 07/10/2016 07/02/2015 09/02/2014  PHQ - 2 Score 0 0 1     Cognitive Function       Ad8 score reviewed for issues:  Issues making decisions: no  Less interest in hobbies / activities: no  Repeats questions, stories (family complaining): no  Trouble using ordinary gadgets (microwave, computer, phone): no  Forgets the month or year: no  Mismanaging finances: no  Remembering appts: no  Daily problems with thinking and/or memory: no Ad8 score is=0     Immunization History  Administered Date(s) Administered  . Influenza Split 12/06/2011  . Influenza Whole 01/25/2001, 01/08/2009, 01/25/2009, 12/01/2009, 11/26/2010  . Influenza, High Dose Seasonal PF 12/30/2014, 12/17/2015  . Pneumococcal Conjugate-13  09/02/2014  . Pneumococcal Polysaccharide-23  01/25/2001, 01/10/2012  . Tdap 06/14/2010  . Zoster 06/26/2007   Screening Tests Health Maintenance  Topic Date Due  . DEXA SCAN  02/21/2001  . HEMOGLOBIN A1C  09/07/2016  . INFLUENZA VACCINE  10/25/2016  . FOOT EXAM  03/09/2017  . OPHTHALMOLOGY EXAM  04/19/2017  . TETANUS/TDAP  06/13/2020  . PNA vac Low Risk Adult  Completed      Plan:     Continue doing brain stimulating activities (puzzles, reading, adult coloring books, staying active) to keep memory sharp.   Bring a copy of your advance directives to your next office visit.  Schedule bone scan with mammogram after August 4th.  During the course of the visit the patient was educated and counseled about the following appropriate screening and preventive services:   Vaccines to include Pneumoccal, Influenza, Hepatitis B, Td, Zostavax, HCV  Cardiovascular Disease  Colorectal cancer screening  Bone density screening  Diabetes screening  Glaucoma screening  Mammography/PAP  Nutrition counseling   Patient Instructions (the written plan) was given to the patient.   Gerilyn Nestle, RN  07/10/2016

## 2016-07-07 NOTE — Progress Notes (Signed)
Pre visit review using our clinic review tool, if applicable. No additional management support is needed unless otherwise documented below in the visit note. 

## 2016-07-10 ENCOUNTER — Ambulatory Visit (INDEPENDENT_AMBULATORY_CARE_PROVIDER_SITE_OTHER): Payer: Medicare Other

## 2016-07-10 ENCOUNTER — Encounter: Payer: Self-pay | Admitting: Family Medicine

## 2016-07-10 ENCOUNTER — Ambulatory Visit (INDEPENDENT_AMBULATORY_CARE_PROVIDER_SITE_OTHER): Payer: Medicare Other | Admitting: Family Medicine

## 2016-07-10 VITALS — BP 124/70 | HR 74 | Temp 97.4°F | Resp 18 | Ht 62.0 in | Wt 176.4 lb

## 2016-07-10 DIAGNOSIS — E7800 Pure hypercholesterolemia, unspecified: Secondary | ICD-10-CM | POA: Diagnosis not present

## 2016-07-10 DIAGNOSIS — R918 Other nonspecific abnormal finding of lung field: Secondary | ICD-10-CM | POA: Diagnosis not present

## 2016-07-10 DIAGNOSIS — E2839 Other primary ovarian failure: Secondary | ICD-10-CM

## 2016-07-10 DIAGNOSIS — E119 Type 2 diabetes mellitus without complications: Secondary | ICD-10-CM

## 2016-07-10 DIAGNOSIS — R197 Diarrhea, unspecified: Secondary | ICD-10-CM | POA: Diagnosis not present

## 2016-07-10 DIAGNOSIS — R5382 Chronic fatigue, unspecified: Secondary | ICD-10-CM | POA: Diagnosis not present

## 2016-07-10 DIAGNOSIS — J9811 Atelectasis: Secondary | ICD-10-CM | POA: Diagnosis not present

## 2016-07-10 DIAGNOSIS — E039 Hypothyroidism, unspecified: Secondary | ICD-10-CM | POA: Diagnosis not present

## 2016-07-10 DIAGNOSIS — D649 Anemia, unspecified: Secondary | ICD-10-CM

## 2016-07-10 DIAGNOSIS — R05 Cough: Secondary | ICD-10-CM | POA: Diagnosis not present

## 2016-07-10 DIAGNOSIS — I25810 Atherosclerosis of coronary artery bypass graft(s) without angina pectoris: Secondary | ICD-10-CM

## 2016-07-10 DIAGNOSIS — R400 Somnolence: Secondary | ICD-10-CM | POA: Diagnosis not present

## 2016-07-10 DIAGNOSIS — J189 Pneumonia, unspecified organism: Secondary | ICD-10-CM

## 2016-07-10 DIAGNOSIS — I10 Essential (primary) hypertension: Secondary | ICD-10-CM | POA: Diagnosis not present

## 2016-07-10 DIAGNOSIS — Z0000 Encounter for general adult medical examination without abnormal findings: Secondary | ICD-10-CM

## 2016-07-10 LAB — BASIC METABOLIC PANEL
BUN: 18 mg/dL (ref 6–23)
CALCIUM: 9.7 mg/dL (ref 8.4–10.5)
CHLORIDE: 101 meq/L (ref 96–112)
CO2: 28 meq/L (ref 19–32)
CREATININE: 0.69 mg/dL (ref 0.40–1.20)
GFR: 86.92 mL/min (ref >60.00)
Glucose, Bld: 102 mg/dL — ABNORMAL HIGH (ref 70–99)
POTASSIUM: 4.6 meq/L (ref 3.5–5.1)
SODIUM: 139 meq/L (ref 135–145)

## 2016-07-10 LAB — HEMOGLOBIN A1C: HEMOGLOBIN A1C: 6.7 % — AB (ref 4.6–6.5)

## 2016-07-10 LAB — CBC WITH DIFFERENTIAL/PLATELET
BASOS ABS: 0.1 10*3/uL (ref 0.0–0.1)
Basophils Relative: 1.3 % (ref 0.0–3.0)
EOS ABS: 0.4 10*3/uL (ref 0.0–0.7)
Eosinophils Relative: 4.6 % (ref 0.0–5.0)
HEMATOCRIT: 36.9 % (ref 36.0–46.0)
Hemoglobin: 12.3 g/dL (ref 12.0–15.0)
LYMPHS PCT: 17.5 % (ref 12.0–46.0)
Lymphs Abs: 1.5 10*3/uL (ref 0.7–4.0)
MCHC: 33.3 g/dL (ref 30.0–36.0)
MCV: 93.2 fl (ref 78.0–100.0)
MONO ABS: 0.9 10*3/uL (ref 0.1–1.0)
Monocytes Relative: 10.6 % (ref 3.0–12.0)
NEUTROS ABS: 5.8 10*3/uL (ref 1.4–7.7)
NEUTROS PCT: 66 % (ref 43.0–77.0)
PLATELETS: 347 10*3/uL (ref 150.0–400.0)
RBC: 3.96 Mil/uL (ref 3.87–5.11)
RDW: 14.1 % (ref 11.5–15.5)
WBC: 8.8 10*3/uL (ref 4.0–10.5)

## 2016-07-10 LAB — VITAMIN B12: VITAMIN B 12: 1316 pg/mL — AB (ref 211–911)

## 2016-07-10 LAB — FERRITIN: FERRITIN: 14.5 ng/mL (ref 10.0–291.0)

## 2016-07-10 LAB — IRON: IRON: 70 ug/dL (ref 42–145)

## 2016-07-10 LAB — TSH: TSH: 1.74 u[IU]/mL (ref 0.35–4.50)

## 2016-07-10 MED ORDER — AZITHROMYCIN 250 MG PO TABS: ORAL_TABLET | ORAL | 0 refills | Status: DC

## 2016-07-10 NOTE — Progress Notes (Signed)
OFFICE VISIT  07/10/2016   CC:  Chief Complaint  Patient presents with  . Medicare Wellness  * F/u chronic illness  HPI:    Patient is a 83 y.o.  female who presents for 4 mo f/u DM 2, HTN, hyperlipidemia. However, pt presents with different agenda today, has several complaints to talk about.  "I'm tired all the time", chronically but worse last 3 weeks.  Started taking oral vit B12 1000 mcg once daily about 1 mo ago--no help. Sleepy tired most of the time, not always secondary to physical activity.  Describes excessive daytime sleepiness. She is definitely deconditioned due to lack of activity related to chronic back and hip and R knee pain. Not known if she snores or has apneic spells in sleep--husband never comments. Has never had eval for OSA.  Home bp monitoring: "normal all the time" Glucoses: 120s mostly. HLD: taking atorva daily, denies side effects.  Notes no melena or BRBPR.   Appetite good, drinks a fair amount of clear liquids during daytime. No unexplained fevers.  Denies dysuria, hematuria, urinary frequency or urgency. Has had watery BMs lately, sounds like about 1 mo duration but pt not able to be clear on this point.  Also she is tough to pin down regarding how many stools per day are watery/loose.  No relation to type of food.  Has fecal urgency sometimes.  Has been on cipro for UTI proph but stopped this lately and diarrhea continues.  Minimal abd discomfort.  No n/v.   Past Medical History:  Diagnosis Date  . Abdominal pain, right lower quadrant   . Anxiety   . Bursitis   . Cataracts, both eyes    soon to have cataract surg as of 03/2015  . Depression   . 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 Diverticulosis of colon    noted on colonoscopy 2008  . Dyspnea    a. 09/2011 CTA Chest: No PE, Ca2+ cors;  b. 09/2011 R&L heart Cath: Relatively nl R heart pressures, nl co/ci, nonobs cad, nl EF.  12/2015 stress test normal, echo with grd II DD--likely  explains DOE.  Marland Kitchen External hemorrhoid   . Former smoker    30 pack-yrs, quit 2005  . History of adenomatous polyp of colon   . Hot flashes    restarted estradiol 01/2015  . Hyperlipidemia   . Hypertension   . Hypothyroidism   . IBS (irritable bowel syndrome)   . Interstitial cystitis   . Liver function study, abnormal   . Lumbar degenerative disc disease    Facet-mediated pain per Dr. Nelva Bush.  Has gotten repeated LB injections, most recently April 2016 in [place].  . Menopausal syndrome   . Morton's neuroma   . Obesity, unspecified   . Other specified gastritis without mention of hemorrhage    hx of antral gastritis on EGD 05/2009  . Peripheral neuropathy    per EMG 11/2012.  Intolerant of gabapentin, failed pamelor.  Radiofrequency neurotomy 12/11/14 helped LB pain (Dr. Nelva Bush)  . Recurrent UTI    on daily antibiotic prophylaxis by her urologist.  . RLS (restless legs syndrome)   . Tarsal tunnel syndrome   . Unspecified venous (peripheral) insufficiency     Past Surgical History:  Procedure Laterality Date  . ANKLE SURGERY     bilateral  . CARDIAC CATHETERIZATION  09/2011   Nonobstructive CAD  . CARDIOVASCULAR STRESS TEST  01/04/2016   Nuclear medicine stress test: NORMAL  . CARDIOVASCULAR STRESS  TEST  01/04/2016   Normal stress nuclear study with no ischemia or infarction; EF 82 with normal wall motion  . COLONOSCOPY  2002/2005, 11/14/06, and 05/2009   polyps 2002 but none 2005 or 2008 or 2011.  Recall recommended 7-10 yrs  . ESOPHAGOGASTRODUODENOSCOPY  05/2009   Moderate antral gastritis  . FOOT SURGERY     R foot bunionectomy and arthroplasty of digits R foot.  Arthroplasty digits 3 and 4 L foot.  Marland Kitchen HAMMER TOE SURGERY     bilateral  . left tarsal tunnel release     sept '11 (Dr Beola Cord)  . radiofrequency neurotomy  06/18/2015   bilat L3-4 medial branch nerve and bilat L5 dorsal ramus nerve.(Dr. Nelva Bush)  . TONSILLECTOMY    . TOTAL ABDOMINAL HYSTERECTOMY W/ BILATERAL  SALPINGOOPHORECTOMY    . TRANSTHORACIC ECHOCARDIOGRAM  08/2011; 2017   2013 Grade I DD; 2017 EF 60-65%, normal wall motion, grd II DD.    Outpatient Medications Prior to Visit  Medication Sig Dispense Refill  . albuterol (PROVENTIL HFA;VENTOLIN HFA) 108 (90 Base) MCG/ACT inhaler Inhale 1 puff into the lungs every 6 (six) hours as needed for wheezing or shortness of breath. 6.7 g 0  . ALPRAZolam (XANAX) 0.25 MG tablet Take 0.25 mg by mouth at bedtime as needed for anxiety.    Marland Kitchen aspirin 81 MG tablet Take 81 mg by mouth daily.    Marland Kitchen atorvastatin (LIPITOR) 20 MG tablet TAKE ONE TABLET BY MOUTH DAILY 90 tablet 3  . blood glucose meter kit and supplies Check blood sugar once every morning. 1 each 0  . CALCIUM PO Take 1 tablet by mouth daily.    . cetirizine (ZYRTEC) 10 MG tablet Take 10 mg by mouth as needed for allergies.    . DULoxetine (CYMBALTA) 60 MG capsule TAKE ONE CAPSULE BY MOUTH DAILY 90 capsule 3  . estradiol (ESTRACE) 1 MG tablet Take 1 tablet (1 mg total) by mouth daily. 90 tablet 3  . HYDROcodone-acetaminophen (NORCO/VICODIN) 5-325 MG per tablet Take 1 tablet by mouth every 6 (six) hours as needed for moderate pain.    Marland Kitchen levothyroxine (SYNTHROID, LEVOTHROID) 25 MCG tablet TAKE ONE TABLET BY MOUTH DAILY 90 tablet 3  . loperamide (IMODIUM) 2 MG capsule Take 2 mg by mouth as needed for diarrhea or loose stools. Reported on 05/10/2015    . losartan-hydrochlorothiazide (HYZAAR) 50-12.5 MG tablet Take 1 tablet by mouth daily. 90 tablet 3  . LYRICA 50 MG capsule TAKE ONE CAPSULE BY MOUTH TWICE DAILY 60 capsule 3  . meloxicam (MOBIC) 15 MG tablet TAKE ONE TABLET BY MOUTH DAILY AS NEEDED FOR PAIN 90 tablet 3  . metFORMIN (GLUCOPHAGE) 1000 MG tablet TAKE ONE TABLET BY MOUTH TWICE DAILY 180 tablet 0  . metoprolol (LOPRESSOR) 50 MG tablet Take 1 tablet (50 mg total) by mouth 2 (two) times daily. 180 tablet 3  . mirtazapine (REMERON) 30 MG tablet TAKE 1/2 TO 1 TABLET BY MOUTH AT BEDTIME FOR pruitis  90 tablet 3  . omeprazole (PRILOSEC) 20 MG capsule TAKE ONE CAPSULE BY MOUTH DAILY 90 capsule 1  . OVER THE COUNTER MEDICATION Eye Vitamin - Areds2    . potassium chloride SA (K-DUR,KLOR-CON) 20 MEQ tablet TAKE ONE TABLET BY MOUTH DAILY 90 tablet 3  . benzonatate (TESSALON) 100 MG capsule Take 1 capsule (100 mg total) by mouth 3 (three) times daily as needed for cough. (Patient not taking: Reported on 07/10/2016) 20 capsule 0  . oxybutynin (DITROPAN-XL) 10 MG  24 hr tablet TAKE ONE TABLET BY MOUTH DAILY (Patient not taking: Reported on 07/10/2016) 90 tablet 3   No facility-administered medications prior to visit.     Allergies  Allergen Reactions  . Gabapentin Itching  . Sulfonamide Derivatives     REACTION: Nausea    ROS As per HPI  PE: Blood pressure 124/70, pulse 74, temperature 97.4 F (36.3 C), resp. rate 18, height 5' 2" (1.575 m), weight 176 lb 6.4 oz (80 kg), SpO2 96 %. Gen: Alert, well appearing.  Patient is oriented to person, place, time, and situation. AFFECT: pleasant, lucid thought and speech. RJJ:OACZ: no injection, icteris, swelling, or exudate.  EOMI, PERRLA. Mouth: lips without lesion/swelling.  Oral mucosa pink but with slight pallor.  Oral mucosa moist. Oropharynx without erythema, exudate, or swelling.   CV: RRR, no m/r/g.   LUNGS: CTA bilat except I do hear inspiratory crackles in L base.  Nonlabored resps, good aeration in all lung fields. ABD: soft, mild tenderness to palpation diffusely.  No guarding or rebound.  ND, BS normal.  No hepatospenomegaly or mass.  No bruits. EXT: no clubbing, cyanosis, or edema.   LABS:  Lab Results  Component Value Date   TSH 1.74 07/10/2016   Lab Results  Component Value Date   WBC 8.8 07/10/2016   HGB 12.3 07/10/2016   HCT 36.9 07/10/2016   MCV 93.2 07/10/2016   PLT 347.0 07/10/2016   Lab Results  Component Value Date   CREATININE 0.69 07/10/2016   BUN 18 07/10/2016   NA 139 07/10/2016   K 4.6 07/10/2016   CL 101  07/10/2016   CO2 28 07/10/2016   Lab Results  Component Value Date   ALT 22 03/09/2016   AST 16 03/09/2016   ALKPHOS 81 03/09/2016   BILITOT 0.4 03/09/2016   Lab Results  Component Value Date   CHOL 147 12/27/2015   Lab Results  Component Value Date   HDL 45 (L) 12/27/2015   Lab Results  Component Value Date   LDLCALC 46 12/27/2015   Lab Results  Component Value Date   TRIG 282 (H) 12/27/2015   Lab Results  Component Value Date   CHOLHDL 3.3 12/27/2015   Lab Results  Component Value Date   HGBA1C 6.7 (H) 07/10/2016   Lab Results  Component Value Date   VITAMINB12 1,316 (H) 07/10/2016   IMPRESSION AND PLAN:  1) Acute-on-chronic fatigue: suspect multifactorial.  Deconditioning, polypharmacy, possible pneumonia (abnormal lung sounds), and possibly OSA. Check CXR today. Obtain lab panel: Vit B12, TSH, iron panel w/ ferritin, CBC w/diff, BMET.   Will refer to pulmonology for further evaluation for possible OSA.  2) DM 2: glucoses at home pretty good, her DM has historically been well controlled. Hba1c today.  3) Diarrhea, subacute: will check stool studies: stool culture, fecal lactoferrin, giardia/crypto, C diff pcr. Consider metformin as potential cause if all stool studies normal.  4) Hypertension: The current medical regimen is effective;  continue present plan and medications. Lytes/cr today.  5) Hyperlipidemia: tolerating statin.  FLP great 12/2015.  Plan on repeat FLP in 6 mo.  Spent 45 min with pt today, with >50% of this time spent in counseling and care coordination regarding the above problems.  FOLLOW UP: Return in about 3 months (around 10/09/2016) for routine chronic illness f/u.  Signed:  Crissie Sickles, MD           07/10/2016   ADDENDUM 4:30 PM 07/10/16: CXR showed infiltrates vs atelectasis in  lingula and RLL. Will treat with Z-pack and plan on recheck of CXR in 6 weeks.--PM

## 2016-07-10 NOTE — Patient Instructions (Addendum)
Continue doing brain stimulating activities (puzzles, reading, adult coloring books, staying active) to keep memory sharp.   Bring a copy of your advance directives to your next office visit.  Schedule bone scan with mammogram after August 4th.   Health Maintenance, Female Adopting a healthy lifestyle and getting preventive care can go a long way to promote health and wellness. Talk with your health care provider about what schedule of regular examinations is right for you. This is a good chance for you to check in with your provider about disease prevention and staying healthy. In between checkups, there are plenty of things you can do on your own. Experts have done a lot of research about which lifestyle changes and preventive measures are most likely to keep you healthy. Ask your health care provider for more information. Weight and diet Eat a healthy diet  Be sure to include plenty of vegetables, fruits, low-fat dairy products, and lean protein.  Do not eat a lot of foods high in solid fats, added sugars, or salt.  Get regular exercise. This is one of the most important things you can do for your health.  Most adults should exercise for at least 150 minutes each week. The exercise should increase your heart rate and make you sweat (moderate-intensity exercise).  Most adults should also do strengthening exercises at least twice a week. This is in addition to the moderate-intensity exercise. Maintain a healthy weight  Body mass index (BMI) is a measurement that can be used to identify possible weight problems. It estimates body fat based on height and weight. Your health care provider can help determine your BMI and help you achieve or maintain a healthy weight.  For females 18 years of age and older:  A BMI below 18.5 is considered underweight.  A BMI of 18.5 to 24.9 is normal.  A BMI of 25 to 29.9 is considered overweight.  A BMI of 30 and above is considered obese. Watch  levels of cholesterol and blood lipids  You should start having your blood tested for lipids and cholesterol at 81 years of age, then have this test every 5 years.  You may need to have your cholesterol levels checked more often if:  Your lipid or cholesterol levels are high.  You are older than 81 years of age.  You are at high risk for heart disease. Cancer screening Lung Cancer  Lung cancer screening is recommended for adults 73-64 years old who are at high risk for lung cancer because of a history of smoking.  A yearly low-dose CT scan of the lungs is recommended for people who:  Currently smoke.  Have quit within the past 15 years.  Have at least a 30-pack-year history of smoking. A pack year is smoking an average of one pack of cigarettes a day for 1 year.  Yearly screening should continue until it has been 15 years since you quit.  Yearly screening should stop if you develop a health problem that would prevent you from having lung cancer treatment. Breast Cancer  Practice breast self-awareness. This means understanding how your breasts normally appear and feel.  It also means doing regular breast self-exams. Let your health care provider know about any changes, no matter how small.  If you are in your 20s or 30s, you should have a clinical breast exam (CBE) by a health care provider every 1-3 years as part of a regular health exam.  If you are 35 or older, have a CBE  every year. Also consider having a breast X-ray (mammogram) every year.  If you have a family history of breast cancer, talk to your health care provider about genetic screening.  If you are at high risk for breast cancer, talk to your health care provider about having an MRI and a mammogram every year.  Breast cancer gene (BRCA) assessment is recommended for women who have family members with BRCA-related cancers. BRCA-related cancers include:  Breast.  Ovarian.  Tubal.  Peritoneal  cancers.  Results of the assessment will determine the need for genetic counseling and BRCA1 and BRCA2 testing. Cervical Cancer  Your health care provider may recommend that you be screened regularly for cancer of the pelvic organs (ovaries, uterus, and vagina). This screening involves a pelvic examination, including checking for microscopic changes to the surface of your cervix (Pap test). You may be encouraged to have this screening done every 3 years, beginning at age 25.  For women ages 30-65, health care providers may recommend pelvic exams and Pap testing every 3 years, or they may recommend the Pap and pelvic exam, combined with testing for human papilloma virus (HPV), every 5 years. Some types of HPV increase your risk of cervical cancer. Testing for HPV may also be done on women of any age with unclear Pap test results.  Other health care providers may not recommend any screening for nonpregnant women who are considered low risk for pelvic cancer and who do not have symptoms. Ask your health care provider if a screening pelvic exam is right for you.  If you have had past treatment for cervical cancer or a condition that could lead to cancer, you need Pap tests and screening for cancer for at least 20 years after your treatment. If Pap tests have been discontinued, your risk factors (such as having a new sexual partner) need to be reassessed to determine if screening should resume. Some women have medical problems that increase the chance of getting cervical cancer. In these cases, your health care provider may recommend more frequent screening and Pap tests. Colorectal Cancer  This type of cancer can be detected and often prevented.  Routine colorectal cancer screening usually begins at 81 years of age and continues through 81 years of age.  Your health care provider may recommend screening at an earlier age if you have risk factors for colon cancer.  Your health care provider may also  recommend using home test kits to check for hidden blood in the stool.  A small camera at the end of a tube can be used to examine your colon directly (sigmoidoscopy or colonoscopy). This is done to check for the earliest forms of colorectal cancer.  Routine screening usually begins at age 62.  Direct examination of the colon should be repeated every 5-10 years through 81 years of age. However, you may need to be screened more often if early forms of precancerous polyps or small growths are found. Skin Cancer  Check your skin from head to toe regularly.  Tell your health care provider about any new moles or changes in moles, especially if there is a change in a mole's shape or color.  Also tell your health care provider if you have a mole that is larger than the size of a pencil eraser.  Always use sunscreen. Apply sunscreen liberally and repeatedly throughout the day.  Protect yourself by wearing long sleeves, pants, a wide-brimmed hat, and sunglasses whenever you are outside. Heart disease, diabetes, and high blood  pressure  High blood pressure causes heart disease and increases the risk of stroke. High blood pressure is more likely to develop in:  People who have blood pressure in the high end of the normal range (130-139/85-89 mm Hg).  People who are overweight or obese.  People who are African American.  If you are 68-79 years of age, have your blood pressure checked every 3-5 years. If you are 81 years of age or older, have your blood pressure checked every year. You should have your blood pressure measured twice-once when you are at a hospital or clinic, and once when you are not at a hospital or clinic. Record the average of the two measurements. To check your blood pressure when you are not at a hospital or clinic, you can use:  An automated blood pressure machine at a pharmacy.  A home blood pressure monitor.  If you are between 4 years and 65 years old, ask your health  care provider if you should take aspirin to prevent strokes.  Have regular diabetes screenings. This involves taking a blood sample to check your fasting blood sugar level.  If you are at a normal weight and have a low risk for diabetes, have this test once every three years after 81 years of age.  If you are overweight and have a high risk for diabetes, consider being tested at a younger age or more often. Preventing infection Hepatitis B  If you have a higher risk for hepatitis B, you should be screened for this virus. You are considered at high risk for hepatitis B if:  You were born in a country where hepatitis B is common. Ask your health care provider which countries are considered high risk.  Your parents were born in a high-risk country, and you have not been immunized against hepatitis B (hepatitis B vaccine).  You have HIV or AIDS.  You use needles to inject street drugs.  You live with someone who has hepatitis B.  You have had sex with someone who has hepatitis B.  You get hemodialysis treatment.  You take certain medicines for conditions, including cancer, organ transplantation, and autoimmune conditions. Hepatitis C  Blood testing is recommended for:  Everyone born from [birth year] through 1963.  Anyone with known risk factors for hepatitis C. Sexually transmitted infections (STIs)  You should be screened for sexually transmitted infections (STIs) including gonorrhea and chlamydia if:  You are sexually active and are younger than 81 years of age.  You are older than 81 years of age and your health care provider tells you that you are at risk for this type of infection.  Your sexual activity has changed since you were last screened and you are at an increased risk for chlamydia or gonorrhea. Ask your health care provider if you are at risk.  If you do not have HIV, but are at risk, it may be recommended that you take a prescription medicine daily to prevent HIV  infection. This is called pre-exposure prophylaxis (PrEP). You are considered at risk if:  You are sexually active and do not regularly use condoms or know the HIV status of your partner(s).  You take drugs by injection.  You are sexually active with a partner who has HIV. Talk with your health care provider about whether you are at high risk of being infected with HIV. If you choose to begin PrEP, you should first be tested for HIV. You should then be tested every 3 months for  as long as you are taking PrEP. Pregnancy  If you are premenopausal and you may become pregnant, ask your health care provider about preconception counseling.  If you may become pregnant, take 400 to 800 micrograms (mcg) of folic acid every day.  If you want to prevent pregnancy, talk to your health care provider about birth control (contraception). Osteoporosis and menopause  Osteoporosis is a disease in which the bones lose minerals and strength with aging. This can result in serious bone fractures. Your risk for osteoporosis can be identified using a bone density scan.  If you are 67 years of age or older, or if you are at risk for osteoporosis and fractures, ask your health care provider if you should be screened.  Ask your health care provider whether you should take a calcium or vitamin D supplement to lower your risk for osteoporosis.  Menopause may have certain physical symptoms and risks.  Hormone replacement therapy may reduce some of these symptoms and risks. Talk to your health care provider about whether hormone replacement therapy is right for you. Follow these instructions at home:  Schedule regular health, dental, and eye exams.  Stay current with your immunizations.  Do not use any tobacco products including cigarettes, chewing tobacco, or electronic cigarettes.  If you are pregnant, do not drink alcohol.  If you are breastfeeding, limit how much and how often you drink alcohol.  Limit  alcohol intake to no more than 1 drink per day for nonpregnant women. One drink equals 12 ounces of beer, 5 ounces of wine, or 1 ounces of hard liquor.  Do not use street drugs.  Do not share needles.  Ask your health care provider for help if you need support or information about quitting drugs.  Tell your health care provider if you often feel depressed.  Tell your health care provider if you have ever been abused or do not feel safe at home. This information is not intended to replace advice given to you by your health care provider. Make sure you discuss any questions you have with your health care provider. Document Released: 09/26/2010 Document Revised: 08/19/2015 Document Reviewed: 12/15/2014 Elsevier Interactive Patient Education  2017 [employer].

## 2016-07-11 ENCOUNTER — Other Ambulatory Visit: Payer: Self-pay | Admitting: Family Medicine

## 2016-07-11 DIAGNOSIS — E611 Iron deficiency: Secondary | ICD-10-CM

## 2016-07-11 DIAGNOSIS — N3281 Overactive bladder: Secondary | ICD-10-CM | POA: Diagnosis not present

## 2016-07-11 DIAGNOSIS — N3020 Other chronic cystitis without hematuria: Secondary | ICD-10-CM | POA: Diagnosis not present

## 2016-07-11 LAB — FECAL LACTOFERRIN, QUANT: Lactoferrin: POSITIVE

## 2016-07-11 LAB — CLOSTRIDIUM DIFFICILE BY PCR: Toxigenic C. Difficile by PCR: NOT DETECTED

## 2016-07-11 NOTE — Progress Notes (Signed)
Reviewed AWV and agree.  Signed:  Crissie Sickles, MD           07/11/2016

## 2016-07-14 LAB — GIARDIA/CRYPTOSPORIDIUM (EIA)

## 2016-07-14 LAB — STOOL CULTURE

## 2016-07-17 ENCOUNTER — Encounter: Payer: Self-pay | Admitting: Pulmonary Disease

## 2016-07-17 ENCOUNTER — Encounter: Payer: Self-pay | Admitting: Internal Medicine

## 2016-07-17 ENCOUNTER — Ambulatory Visit (INDEPENDENT_AMBULATORY_CARE_PROVIDER_SITE_OTHER): Payer: Medicare Other | Admitting: Pulmonary Disease

## 2016-07-17 VITALS — BP 134/82 | HR 81 | Ht 62.0 in | Wt 176.0 lb

## 2016-07-17 DIAGNOSIS — I25810 Atherosclerosis of coronary artery bypass graft(s) without angina pectoris: Secondary | ICD-10-CM

## 2016-07-17 DIAGNOSIS — R0683 Snoring: Secondary | ICD-10-CM | POA: Diagnosis not present

## 2016-07-17 NOTE — Patient Instructions (Signed)
Will arrange for home sleep study Will call to arrange for follow up after sleep study reviewed  

## 2016-07-17 NOTE — Progress Notes (Signed)
Past Surgical History She  has a past surgical history that includes Total abdominal hysterectomy w/ bilateral salpingoophorectomy; Tonsillectomy; Foot surgery; left tarsal tunnel release; Colonoscopy (2002/2005, 11/14/06, and 05/2009); Hammer toe surgery; Ankle surgery; Esophagogastroduodenoscopy (05/2009); Cardiac catheterization (09/2011); transthoracic echocardiogram (08/2011; 2017); radiofrequency neurotomy (06/18/2015); Cardiovascular stress test (01/04/2016); and Cardiovascular stress test (01/04/2016).  Allergies  Allergen Reactions  . Gabapentin Itching  . Sulfonamide Derivatives     REACTION: Nausea    Family History Her family history includes Cancer in her mother; Diabetes in her maternal grandfather; Hyperlipidemia in her father; Hypertension in her father.  Social History She  reports that she quit smoking about 14 years ago. Her smoking use included Cigarettes. She has a 35.00 pack-year smoking history. She quit smokeless tobacco use about 11 years ago. She reports that she does not drink alcohol or use drugs.  Review of systems  Constitutional: Negative for fever and unexpected weight change.  HENT: Positive for rhinorrhea. Negative for congestion, dental problem, ear pain, nosebleeds, postnasal drip, sinus pressure, sneezing, sore throat and trouble swallowing.   Eyes: Negative for redness and itching.  Respiratory: Negative for cough, chest tightness, shortness of breath and wheezing.   Cardiovascular: Positive for leg swelling. Negative for palpitations.  Gastrointestinal: Negative for nausea and vomiting.  Genitourinary: Negative for dysuria.  Musculoskeletal: Negative for joint swelling.  Skin: Negative for rash.  Neurological: Negative for headaches.  Hematological: Does not bruise/bleed easily.  Psychiatric/Behavioral: Negative for dysphoric mood. The patient is not nervous/anxious.     Current Outpatient Prescriptions on File Prior to Visit  Medication Sig  .  albuterol (PROVENTIL HFA;VENTOLIN HFA) 108 (90 Base) MCG/ACT inhaler Inhale 1 puff into the lungs every 6 (six) hours as needed for wheezing or shortness of breath.  . ALPRAZolam (XANAX) 0.25 MG tablet Take 0.25 mg by mouth at bedtime as needed for anxiety.  Marland Kitchen aspirin 81 MG tablet Take 81 mg by mouth daily.  Marland Kitchen atorvastatin (LIPITOR) 20 MG tablet TAKE ONE TABLET BY MOUTH DAILY  . blood glucose meter kit and supplies Check blood sugar once every morning.  Marland Kitchen CALCIUM PO Take 1 tablet by mouth daily.  . cetirizine (ZYRTEC) 10 MG tablet Take 10 mg by mouth as needed for allergies.  . Cyanocobalamin (VITAMIN B-12 PO) Take by mouth.  . DULoxetine (CYMBALTA) 60 MG capsule TAKE ONE CAPSULE BY MOUTH DAILY  . estradiol (ESTRACE) 1 MG tablet Take 1 tablet (1 mg total) by mouth daily.  Marland Kitchen HYDROcodone-acetaminophen (NORCO/VICODIN) 5-325 MG per tablet Take 1 tablet by mouth every 6 (six) hours as needed for moderate pain.  Marland Kitchen levothyroxine (SYNTHROID, LEVOTHROID) 25 MCG tablet TAKE ONE TABLET BY MOUTH DAILY  . loperamide (IMODIUM) 2 MG capsule Take 2 mg by mouth as needed for diarrhea or loose stools. Reported on 05/10/2015  . losartan-hydrochlorothiazide (HYZAAR) 50-12.5 MG tablet Take 1 tablet by mouth daily.  Marland Kitchen LYRICA 50 MG capsule TAKE ONE CAPSULE BY MOUTH TWICE DAILY  . meloxicam (MOBIC) 15 MG tablet TAKE ONE TABLET BY MOUTH DAILY AS NEEDED FOR PAIN  . metFORMIN (GLUCOPHAGE) 1000 MG tablet TAKE ONE TABLET BY MOUTH TWICE DAILY  . metoprolol (LOPRESSOR) 50 MG tablet Take 1 tablet (50 mg total) by mouth 2 (two) times daily.  . mirtazapine (REMERON) 30 MG tablet TAKE 1/2 TO 1 TABLET BY MOUTH AT BEDTIME FOR pruitis  . Multiple Vitamins-Minerals (MULTIVITAMIN ADULT PO) Take by mouth.  Marland Kitchen omeprazole (PRILOSEC) 20 MG capsule TAKE ONE CAPSULE BY MOUTH DAILY  .  OVER THE COUNTER MEDICATION Eye Vitamin - Areds2  . potassium chloride SA (K-DUR,KLOR-CON) 20 MEQ tablet TAKE ONE TABLET BY MOUTH DAILY   No current  facility-administered medications on file prior to visit.     Chief Complaint  Patient presents with  . Sleep Consult    Referred by Dr [name] for chronic fatigue. 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); Diverticulosis of colon; Dyspnea; External hemorrhoid; Former smoker; History of adenomatous polyp of colon; Hot flashes; Hyperlipidemia; Hypertension; Hypothyroidism; IBS (irritable bowel syndrome); Interstitial cystitis; Liver function study, abnormal; Lumbar degenerative disc disease; Menopausal syndrome; Morton's neuroma; Obesity, unspecified; Other specified gastritis without mention of hemorrhage; Peripheral neuropathy; Recurrent UTI; RLS (restless legs syndrome); Tarsal tunnel syndrome; and Unspecified venous (peripheral) insufficiency.  Vital signs BP 134/82 (BP Location: Left Arm, Cuff Size: Normal)   Pulse 81   Ht '5\' 2"'$  (1.575 m)   Wt 176 lb (79.8 kg)   SpO2 97%   BMI 32.19 kg/m   History of Present Illness Katherine Best is a 83 y.o. female for evaluation of sleep problems.  She was seen recently by PCP.  She has been c/o feeling fatigued.  She snores, and has trouble with her sleep.  There is concern she could have sleep apnea.  She has trouble sleeping on her back, and has to sip water during the night.  She gets vivid dreams, and occasional leg cramps.  She will nap sometimes during the day.  Her husband has told her she snores and her breathing can get shallow at times.  She goes to sleep at 10 pm.  She falls asleep quickly.  She wakes up several times to use the bathroom.  She gets out of bed at 8 am.  She feels tired in the morning.  She denies morning headache.  She does not use anything to help her fall sleep or stay awake.  She  denies sleep walking, sleep talking, bruxism, or nightmares.  There is no history of restless legs.  She denies sleep hallucinations, sleep paralysis, or cataplexy.  The Epworth score is 2 out of 24.   Physical Exam:  General - No distress ENT - No sinus tenderness, no oral exudate, no LAN, no thyromegaly, TM clear, pupils equal/reactive, MP 4 Cardiac - s1s2 regular, no murmur, pulses symmetric Chest - No wheeze/rales/dullness, good air entry, normal respiratory excursion Back - No focal tenderness Abd - Soft, non-tender, no organomegaly, + bowel sounds Ext - No edema Neuro - Normal strength, cranial nerves intact Skin - No rashes Psych - Normal mood, and behavior  Discussion: She has snoring, sleep disruption, witnessed apnea and daytime sleepiness.  She has hx of HTN, Depression, Depression, and hypothyroidism.  I am concerned she could have sleep apnea.  We discussed how sleep apnea can affect various health problems, including risks for hypertension, cardiovascular disease, and diabetes.  We also discussed how sleep disruption can increase risks for accidents, such as while driving.  Weight loss as a means of improving sleep apnea was also reviewed.  Additional treatment options discussed were CPAP therapy, oral appliance, and surgical intervention.   Assessment/plan:  Snoring with concern for sleep apnea. - will arrange for home sleep study   Patient Instructions  Will arrange  for home sleep study Will call to arrange for follow up after sleep study reviewed     Chesley Mires, M.D. Pager [phone] 07/17/2016, 9:51 AM

## 2016-07-17 NOTE — Progress Notes (Signed)
   Subjective:    Patient ID: Stacie Glaze, female    DOB: 04-Apr-1933, 83 y.o.   MRN: [id number]  HPI    Review of Systems  Constitutional: Negative for fever and unexpected weight change.  HENT: Positive for rhinorrhea. Negative for congestion, dental problem, ear pain, nosebleeds, postnasal drip, sinus pressure, sneezing, sore throat and trouble swallowing.   Eyes: Negative for redness and itching.  Respiratory: Negative for cough, chest tightness, shortness of breath and wheezing.   Cardiovascular: Positive for leg swelling. Negative for palpitations.  Gastrointestinal: Negative for nausea and vomiting.  Genitourinary: Negative for dysuria.  Musculoskeletal: Negative for joint swelling.  Skin: Negative for rash.  Neurological: Negative for headaches.  Hematological: Does not bruise/bleed easily.  Psychiatric/Behavioral: Negative for dysphoric mood. The patient is not nervous/anxious.        Objective:   Physical Exam        Assessment & Plan:

## 2016-07-18 ENCOUNTER — Other Ambulatory Visit: Payer: Medicare Other

## 2016-07-18 ENCOUNTER — Encounter: Payer: Self-pay | Admitting: Family Medicine

## 2016-07-18 DIAGNOSIS — E611 Iron deficiency: Secondary | ICD-10-CM

## 2016-07-18 LAB — HEMOCCULT SLIDES (X 3 CARDS)
Fecal Occult Blood: NEGATIVE
OCCULT 1: NEGATIVE
OCCULT 2: NEGATIVE
OCCULT 3: NEGATIVE
OCCULT 4: NEGATIVE
OCCULT 5: NEGATIVE

## 2016-07-19 NOTE — Telephone Encounter (Signed)
Please see my chart message.   Thanks

## 2016-07-21 ENCOUNTER — Other Ambulatory Visit: Payer: Self-pay | Admitting: Family Medicine

## 2016-07-21 DIAGNOSIS — Z1231 Encounter for screening mammogram for malignant neoplasm of breast: Secondary | ICD-10-CM

## 2016-07-25 ENCOUNTER — Encounter: Payer: Self-pay | Admitting: Gastroenterology

## 2016-07-25 ENCOUNTER — Ambulatory Visit (INDEPENDENT_AMBULATORY_CARE_PROVIDER_SITE_OTHER): Payer: Medicare Other | Admitting: Gastroenterology

## 2016-07-25 ENCOUNTER — Telehealth: Payer: Self-pay | Admitting: Gastroenterology

## 2016-07-25 VITALS — BP 126/82 | HR 74 | Ht 62.0 in | Wt 176.5 lb

## 2016-07-25 DIAGNOSIS — I25810 Atherosclerosis of coronary artery bypass graft(s) without angina pectoris: Secondary | ICD-10-CM

## 2016-07-25 DIAGNOSIS — G4733 Obstructive sleep apnea (adult) (pediatric): Secondary | ICD-10-CM

## 2016-07-25 DIAGNOSIS — K580 Irritable bowel syndrome with diarrhea: Secondary | ICD-10-CM

## 2016-07-25 DIAGNOSIS — Z1211 Encounter for screening for malignant neoplasm of colon: Secondary | ICD-10-CM | POA: Insufficient documentation

## 2016-07-25 DIAGNOSIS — K529 Noninfective gastroenteritis and colitis, unspecified: Secondary | ICD-10-CM | POA: Diagnosis not present

## 2016-07-25 HISTORY — DX: Obstructive sleep apnea (adult) (pediatric): G47.33

## 2016-07-25 MED ORDER — DIPHENOXYLATE-ATROPINE 2.5-0.025 MG PO TABS: 2.0000 | ORAL_TABLET | Freq: Four times a day (QID) | ORAL | 1 refills | Status: DC | PRN

## 2016-07-25 MED ORDER — RIFAXIMIN 550 MG PO TABS: 550.0000 mg | ORAL_TABLET | Freq: Three times a day (TID) | ORAL | 0 refills | Status: DC

## 2016-07-25 NOTE — Patient Instructions (Signed)
We have sent your demographic information and a prescription for Xifaxan to [facility]. This pharmacy is able to get medication approved through insurance and get you the lowest copay possible. If you have not heard from them within 1 week, please call our office at [phone] to let us know.  We have sent the following medications to your pharmacy for you to pick up at your convenience: Lomotil every 6 hours as needed for pain  You have been scheduled for a colonoscopy. Please follow written instructions given to you at your visit today.  Please pick up your prep supplies at the pharmacy within the next 1-3 days. If you use inhalers (even only as needed), please bring them with you on the day of your procedure. Your physician has requested that you go to www.startemmi.com and enter the access code given to you at your visit today. This web site gives a general overview about your procedure. However, you should still follow specific instructions given to you by our office regarding your preparation for the procedure.

## 2016-07-25 NOTE — Telephone Encounter (Signed)
Patient states she is going out of town and is afraid that her mail order Rx will not arrive in time. Rx called into [facility] and cancelled RX at [facility]. I did warm patient that it may be more expensive at her local pharmacy. She expressed understanding.

## 2016-07-25 NOTE — Progress Notes (Addendum)
07/25/2016 Katherine Best [id number] 12/27/1933   HISTORY OF PRESENT ILLNESS:  This is a pleasant 83 year old female who is known to Dr. Carlean Purl for IBS-D. She's had chronic diarrhea, at least dating back to 2008 which is when her last colonoscopy was performed. At that time she is found have sigmoid diverticulosis and small external hemorrhoids.  Random biopsies were obtained and showed fragments of colonic mucosa with melanosis coli and several small aggregates of eosinophils.  She presents to our office today with complaints of worsening diarrhea. She tells me that Imodium is no longer working. Has been worsening over the last few months but more significantly over the past couple of weeks. Increased urgency and has had incontinence at times. Says that her abdomen feels bloated and tight.  Just of note, she is now on metformin 1000 mg twice daily for about the past year or so. She denies any rectal bleeding.   Past Medical History:  Diagnosis Date  . Abdominal pain, right lower quadrant   . Anxiety   . Bursitis   . Cataracts, both eyes    soon to have cataract surg as of 03/2015  . Depression   . 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 Diverticulosis of colon    noted on colonoscopy 2008  . Dyspnea    a. 09/2011 CTA Chest: No PE, Ca2+ cors;  b. 09/2011 R&L heart Cath: Relatively nl R heart pressures, nl co/ci, nonobs cad, nl EF.  12/2015 stress test normal, echo with grd II DD--likely explains DOE.  Marland Kitchen External hemorrhoid   . Fatigue    w/ ? excessive daytime somnolence; ? OSA--eval by Dr. Halford Chessman 06/2016, home sleep study ordered.  . Former smoker    30 pack-yrs, quit 2005  . History of adenomatous polyp of colon   . Hot flashes    restarted estradiol 01/2015  . Hyperlipidemia   . Hypertension   . Hypothyroidism   . IBS (irritable bowel syndrome)   . Interstitial cystitis   . Liver function study, abnormal   . Lumbar degenerative disc disease    Facet-mediated pain per  Dr. Nelva Bush.  Has gotten repeated LB injections, most recently April 2016 in [place].  . Menopausal syndrome   . Morton's neuroma   . Obesity, unspecified   . Other specified gastritis without mention of hemorrhage    hx of antral gastritis on EGD 05/2009  . Peripheral neuropathy    per EMG 11/2012.  Intolerant of gabapentin, failed pamelor.  Radiofrequency neurotomy 12/11/14 helped LB pain (Dr. Nelva Bush)  . Recurrent UTI    on daily antibiotic prophylaxis by her urologist.  . RLS (restless legs syndrome)   . Tarsal tunnel syndrome   . Unspecified venous (peripheral) insufficiency    Past Surgical History:  Procedure Laterality Date  . ANKLE SURGERY     bilateral  . CARDIAC CATHETERIZATION  09/2011   Nonobstructive CAD  . CARDIOVASCULAR STRESS TEST  01/04/2016   Nuclear medicine stress test: NORMAL  . CARDIOVASCULAR STRESS TEST  01/04/2016   Normal stress nuclear study with no ischemia or infarction; EF 82 with normal wall motion  . COLONOSCOPY  2002/2005, 11/14/06, and 05/2009   polyps 2002 but none 2005 or 2008 or 2011.  Recall recommended 7-10 yrs  . ESOPHAGOGASTRODUODENOSCOPY  05/2009   Moderate antral gastritis  . FOOT SURGERY     R foot bunionectomy and arthroplasty of digits R foot.  Arthroplasty digits 3 and 4 L foot.  Marland Kitchen  HAMMER TOE SURGERY     bilateral  . left tarsal tunnel release     sept '11 (Dr Beola Cord)  . radiofrequency neurotomy  06/18/2015   bilat L3-4 medial branch nerve and bilat L5 dorsal ramus nerve.(Dr. Nelva Bush)  . TONSILLECTOMY    . TOTAL ABDOMINAL HYSTERECTOMY W/ BILATERAL SALPINGOOPHORECTOMY    . TRANSTHORACIC ECHOCARDIOGRAM  08/2011; 2017   2013 Grade I DD; 2017 EF 60-65%, normal wall motion, grd II DD.    reports that she quit smoking about 14 years ago. Her smoking use included Cigarettes. She has a 35.00 pack-year smoking history. She quit smokeless tobacco use about 11 years ago. She reports that she does not drink alcohol or use drugs. family history includes  Cancer in her mother; Diabetes in her maternal grandfather; Hyperlipidemia in her father; Hypertension in her father. Allergies  Allergen Reactions  . Gabapentin Itching  . Sulfonamide Derivatives     REACTION: Nausea      Outpatient Encounter Prescriptions as of 07/25/2016  Medication Sig  . albuterol (PROVENTIL HFA;VENTOLIN HFA) 108 (90 Base) MCG/ACT inhaler Inhale 1 puff into the lungs every 6 (six) hours as needed for wheezing or shortness of breath.  . ALPRAZolam (XANAX) 0.25 MG tablet Take 0.25 mg by mouth at bedtime as needed for anxiety.  Marland Kitchen aspirin 81 MG tablet Take 81 mg by mouth daily.  Marland Kitchen atorvastatin (LIPITOR) 20 MG tablet TAKE ONE TABLET BY MOUTH DAILY  . blood glucose meter kit and supplies Check blood sugar once every morning.  Marland Kitchen CALCIUM PO Take 1 tablet by mouth daily.  . cetirizine (ZYRTEC) 10 MG tablet Take 10 mg by mouth as needed for allergies.  . Cyanocobalamin (VITAMIN B-12 PO) Take by mouth.  . DULoxetine (CYMBALTA) 60 MG capsule TAKE ONE CAPSULE BY MOUTH DAILY  . estradiol (ESTRACE) 1 MG tablet Take 1 tablet (1 mg total) by mouth daily.  Marland Kitchen HYDROcodone-acetaminophen (NORCO/VICODIN) 5-325 MG per tablet Take 1 tablet by mouth every 6 (six) hours as needed for moderate pain.  Marland Kitchen levothyroxine (SYNTHROID, LEVOTHROID) 25 MCG tablet TAKE ONE TABLET BY MOUTH DAILY  . loperamide (IMODIUM) 2 MG capsule Take 2 mg by mouth as needed for diarrhea or loose stools. Reported on 05/10/2015  . losartan-hydrochlorothiazide (HYZAAR) 50-12.5 MG tablet Take 1 tablet by mouth daily.  Marland Kitchen LYRICA 50 MG capsule TAKE ONE CAPSULE BY MOUTH TWICE DAILY  . meloxicam (MOBIC) 15 MG tablet TAKE ONE TABLET BY MOUTH DAILY AS NEEDED FOR PAIN  . metFORMIN (GLUCOPHAGE) 1000 MG tablet TAKE ONE TABLET BY MOUTH TWICE DAILY  . metoprolol (LOPRESSOR) 50 MG tablet Take 1 tablet (50 mg total) by mouth 2 (two) times daily.  . mirtazapine (REMERON) 30 MG tablet TAKE 1/2 TO 1 TABLET BY MOUTH AT BEDTIME FOR pruitis  .  Multiple Vitamins-Minerals (MULTIVITAMIN ADULT PO) Take by mouth.  Marland Kitchen omeprazole (PRILOSEC) 20 MG capsule TAKE ONE CAPSULE BY MOUTH DAILY  . OVER THE COUNTER MEDICATION Eye Vitamin - Areds2  . potassium chloride SA (K-DUR,KLOR-CON) 20 MEQ tablet TAKE ONE TABLET BY MOUTH DAILY   No facility-administered encounter medications on file as of 07/25/2016.      REVIEW OF SYSTEMS  : All other systems reviewed and negative except where noted in the History of Present Illness.   PHYSICAL EXAM: BP 126/82   Pulse 74   Ht 5' 2"  (1.575 m)   Wt 176 lb 8 oz (80.1 kg)   BMI 32.28 kg/m  General: Well developed white female  in no acute distress Head: Normocephalic and atraumatic Eyes:  Sclerae anicteric, conjunctiva pink. Ears: Normal auditory acuity Lungs: Clear throughout to auscultation Heart: Regular rate and rhythm Abdomen: Soft, non-distended. Normal bowel sounds.  Non-tender. Rectal: Will be done at the time of colonoscopy. Musculoskeletal: Symmetrical with no gross deformities  Skin: No lesions on visible extremities Extremities: No edema  Neurological: Alert oriented x 4, grossly non-focal Psychological:  Alert and cooperative. Normal mood and affect  ASSESSMENT AND PLAN: -Chronic diarrhea:  Diagnosed with IBS-D several years ago.  Symptoms significantly worsening recently.  Advised that the high dose of metformin she is now taking may account for some of her worsening symptoms.  Overall, IBS is her running diagnosis.  I am going to have her try Xifaxan 550 mg TID for 14 days if we can get this approved.  Also, will give Lomotil to use in place of Imodium. -Screening colonoscopy:  Last one 10 years ago.  Patient willing to undergo one more procedure.  Will schedule with Dr. Carlean Purl.  *The risks, benefits, and alternatives to colonoscopy were discussed with the patient and she consents to proceed.   CC:  [name], [name], MD Agree with Ms. Jacquelyn Antony's management.  Gatha Mayer, MD,  [facility]

## 2016-07-26 ENCOUNTER — Emergency Department (HOSPITAL_BASED_OUTPATIENT_CLINIC_OR_DEPARTMENT_OTHER)
Admission: EM | Admit: 2016-07-26 | Discharge: 2016-07-26 | Disposition: A | Discharge status: Home / Self Care | Payer: Medicare Other | Attending: Emergency Medicine | Admitting: Emergency Medicine

## 2016-07-26 ENCOUNTER — Telehealth: Payer: Self-pay | Admitting: Family Medicine

## 2016-07-26 ENCOUNTER — Emergency Department (HOSPITAL_BASED_OUTPATIENT_CLINIC_OR_DEPARTMENT_OTHER): Payer: Medicare Other

## 2016-07-26 ENCOUNTER — Encounter (HOSPITAL_BASED_OUTPATIENT_CLINIC_OR_DEPARTMENT_OTHER): Payer: Self-pay

## 2016-07-26 DIAGNOSIS — E039 Hypothyroidism, unspecified: Secondary | ICD-10-CM | POA: Insufficient documentation

## 2016-07-26 DIAGNOSIS — Z79899 Other long term (current) drug therapy: Secondary | ICD-10-CM | POA: Diagnosis not present

## 2016-07-26 DIAGNOSIS — R079 Chest pain, unspecified: Secondary | ICD-10-CM | POA: Diagnosis not present

## 2016-07-26 DIAGNOSIS — Z7984 Long term (current) use of oral hypoglycemic drugs: Secondary | ICD-10-CM | POA: Diagnosis not present

## 2016-07-26 DIAGNOSIS — I10 Essential (primary) hypertension: Secondary | ICD-10-CM | POA: Insufficient documentation

## 2016-07-26 DIAGNOSIS — I2510 Atherosclerotic heart disease of native coronary artery without angina pectoris: Secondary | ICD-10-CM | POA: Diagnosis not present

## 2016-07-26 DIAGNOSIS — Z7982 Long term (current) use of aspirin: Secondary | ICD-10-CM | POA: Insufficient documentation

## 2016-07-26 DIAGNOSIS — Z87891 Personal history of nicotine dependence: Secondary | ICD-10-CM | POA: Insufficient documentation

## 2016-07-26 DIAGNOSIS — E119 Type 2 diabetes mellitus without complications: Secondary | ICD-10-CM | POA: Insufficient documentation

## 2016-07-26 DIAGNOSIS — R0789 Other chest pain: Secondary | ICD-10-CM | POA: Insufficient documentation

## 2016-07-26 LAB — I-STAT CHEM 8, ED
BUN: 16 mg/dL (ref 6–20)
CHLORIDE: 99 mmol/L — AB (ref 101–111)
Calcium, Ion: 1.15 mmol/L (ref 1.15–1.40)
Creatinine, Ser: 0.6 mg/dL (ref 0.44–1.00)
GLUCOSE: 107 mg/dL — AB (ref 65–99)
HCT: 34 % — ABNORMAL LOW (ref 36.0–46.0)
Hemoglobin: 11.6 g/dL — ABNORMAL LOW (ref 12.0–15.0)
POTASSIUM: 4 mmol/L (ref 3.5–5.1)
Sodium: 139 mmol/L (ref 135–145)
TCO2: 31 mmol/L (ref 0–100)

## 2016-07-26 LAB — CBC
HEMATOCRIT: 35.1 % — AB (ref 36.0–46.0)
Hemoglobin: 11.8 g/dL — ABNORMAL LOW (ref 12.0–15.0)
MCH: 31.6 pg (ref 26.0–34.0)
MCHC: 33.6 g/dL (ref 30.0–36.0)
MCV: 93.9 fL (ref 78.0–100.0)
Platelets: 361 10*3/uL (ref 150–400)
RBC: 3.74 MIL/uL — ABNORMAL LOW (ref 3.87–5.11)
RDW: 13.4 % (ref 11.5–15.5)
WBC: 7.9 10*3/uL (ref 4.0–10.5)

## 2016-07-26 LAB — TROPONIN I: Troponin I: <0.03 ng/mL (ref <0.03)

## 2016-07-26 LAB — D-DIMER, QUANTITATIVE: D-Dimer, Quant: <0.27 ug/mL-FEU (ref 0.00–0.50)

## 2016-07-26 MED ORDER — ASPIRIN 81 MG PO CHEW
324.0000 mg | CHEWABLE_TABLET | Freq: Once | ORAL | Status: AC
  Administered 2016-07-26: 243 mg via ORAL
  Filled 2016-07-26: qty 4

## 2016-07-26 MED ORDER — RIFAXIMIN 550 MG PO TABS: 550.0000 mg | ORAL_TABLET | Freq: Three times a day (TID) | ORAL | 0 refills | Status: DC

## 2016-07-26 NOTE — Telephone Encounter (Signed)
Noted agree

## 2016-07-26 NOTE — Discharge Instructions (Signed)
You have been diagnosed by your caregiver as having chest wall pain. °SEEK IMMEDIATE MEDICAL ATTENTION IF: °You develop a fever.  °Your chest pains become severe or intolerable.  °You develop new, unexplained symptoms (problems).  °You develop shortness of breath, nausea, vomiting, sweating or feel light headed.  °You develop a new cough or you cough up blood. ° °

## 2016-07-26 NOTE — Addendum Note (Signed)
Addended by: Wyline Beady on: 07/26/2016 08:48 AM   Modules accepted: Orders

## 2016-07-26 NOTE — Telephone Encounter (Signed)
Almyra Free, RN with [facility] called concerned about pt. She explained the phone call and advised me that pt only wants to speak to Dr. [name]. I advised her that I would call pt. She voiced understanding.  I spoke with pt and advised her per the notes from the triage nurse she should go to the ER for evaluation. Pts husband spoke up in the back ground stating that she is not going to to ER, they are on there way to our office and they will see Dr. [name]. I put pt on hold to speak to [facility] our Glass blower/designer. She advised me to have pt fill out a triage form while we call 911. I spoke with the ladies Lattie Haw and Diane) at the front desk and Lynnell Grain them of these instructions. Estill Bamberg picked up call and advised pt that she should go to the ER and if she decided against this and came to our office we would call 911. Pt voiced understanding.  Pt and husband came to office, 911 was called while I was speaking to pt and her husband. I advised that they we were calling 911 and pts husband stated that we didn't need to call 911 that one of her medications was causing her chest pain and we needed to fix it. I advised him that due to her symptoms she may be having a heart attack. Pt then spoke up and stated that she will go to the ER.   Pt and her husband left our office to go to [facility]. I reviewed chart and pt did go to ER.

## 2016-07-26 NOTE — ED Provider Notes (Signed)
[facility] Provider Note   CSN: [id number] Arrival date & time: 07/26/16  1057     History   Chief Complaint Chief Complaint  Patient presents with  . Chest Pain    HPI Katherine Best is a 83 y.o. female with past medical history of hyperlipidemia, hypertension, diabetes, former smoker, irritable bowel who presents emergency Department with chief complaint of chest tightness. She states that the chest tightness has been ongoing for about the past 2-3 weeks. She states that over the past 3 days and has become extremely uncomfortable. She describes the sensation as "like my body is too tight and needs to come off." She has some associated pain on the lateral chest wall which is reproducible with palpation. The patient was also recently diagnosed with pneumonia on chest x-ray. This was 3 weeks ago by her PCP. She was treated with a course of azithromycin. She denies having any cough, fever or chills at the times and states "I had no idea to have pneumonia." Review of the x-ray shows an area of atelectasis versus pneumonia. Current x-ray shows resolution of these areas. The patient denies pleuritic chest pain, hemoptysis, history of DVT or unilateral leg swelling. She has chronic stasis edema. She denies chest pain, nausea, jaw pain, left arm or shoulder pain. Patient denies pain that radiates into the back. She denies diaphoresis. She has no known coronary artery disease.  HPI  Past Medical History:  Diagnosis Date  . Abdominal pain, right lower quadrant   . Anxiety   . Bursitis   . Cataracts, both eyes    soon to have cataract surg as of 03/2015  . Depression   . Diabetes mellitus with skin complications ([place])   . Diverticulosis of colon    noted on colonoscopy 2008  . Dyspnea    a. 09/2011 CTA Chest: No PE, Ca2+ cors;  b. 09/2011 R&L heart Cath: Relatively nl R heart pressures, nl co/ci, nonobs cad, nl EF.  12/2015 stress test normal, echo with grd II DD--likely explains DOE.    Marland Kitchen External hemorrhoid   . Fatigue    w/ ? excessive daytime somnolence; ? OSA--eval by Dr. Halford Chessman 06/2016, home sleep study ordered.  . Former smoker    30 pack-yrs, quit 2005  . History of adenomatous polyp of colon   . Hot flashes    restarted estradiol 01/2015  . Hyperlipidemia   . Hypertension   . Hypothyroidism   . IBS (irritable bowel syndrome)    IBS-D (Dr. Carlean Purl)  . Interstitial cystitis   . Liver function study, abnormal   . Lumbar degenerative disc disease    Facet-mediated pain per Dr. Nelva Bush.  Has gotten repeated LB injections, most recently April 2016 in [place].  . Menopausal syndrome   . Morton's neuroma   . Obesity, unspecified   . Other specified gastritis without mention of hemorrhage    hx of antral gastritis on EGD 05/2009  . Peripheral neuropathy    per EMG 11/2012.  Intolerant of gabapentin, failed pamelor.  Radiofrequency neurotomy 12/11/14 helped LB pain (Dr. Nelva Bush)  . Recurrent UTI    on daily antibiotic prophylaxis by her urologist.  . RLS (restless legs syndrome)   . Tarsal tunnel syndrome   . Unspecified venous (peripheral) insufficiency     Patient Active Problem List   Diagnosis Date Noted  . Special screening for malignant neoplasms, colon 07/25/2016  . Chronic diarrhea 07/25/2016  . Diabetes mellitus with skin complications ([place])   .  Dyspnea on exertion 04/12/2012  . Cervical neck pain with evidence of disc disease 04/09/2012  . Back pain with radiation 02/13/2012  . Fatigue 10/25/2011  . CAD (coronary artery disease) 10/19/2011  . Diastolic dysfunction, left ventricle 09/07/2011  . Chronic cough 02/03/2011  . Insomnia 12/04/2010  . TARSAL TUNNEL SYNDROME 01/12/2010  . VENOUS INSUFFICIENCY, LEGS 01/12/2010  . SWEATING 01/12/2010  . OBESITY 05/27/2009  . LIVER FUNCTION TESTS, ABNORMAL, HX OF 03/31/2009  . MENOPAUSAL SYNDROME 12/31/2008  . ANXIETY 04/26/2007  . INTERSTITIAL CYSTITIS 04/26/2007  . DIARRHEA, CHRONIC 04/26/2007  . BURSITIS,  HIP 02/22/2007  . EXTERNAL HEMORRHOIDS 11/14/2006  . DIVERTICULOSIS OF COLON 11/14/2006  . HYPOTHYROIDISM NOS 10/10/2006  . Hyperlipidemia 10/10/2006  . MORTON'S NEUROMA 10/10/2006  . Essential hypertension 10/10/2006  . IRRITABLE BOWEL SYNDROME 10/10/2006    Past Surgical History:  Procedure Laterality Date  . ANKLE SURGERY     bilateral  . CARDIAC CATHETERIZATION  09/2011   Nonobstructive CAD  . CARDIOVASCULAR STRESS TEST  01/04/2016   Nuclear medicine stress test: NORMAL  . CARDIOVASCULAR STRESS TEST  01/04/2016   Normal stress nuclear study with no ischemia or infarction; EF 82 with normal wall motion  . COLONOSCOPY  2002/2005, 11/14/06, and 05/2009   polyps 2002 but none 2005 or 2008 or 2011.  Recall recommended 7-10 yrs: plan for final repeat TCS 2018 w/ Dr. Carlean Purl.  . ESOPHAGOGASTRODUODENOSCOPY  05/2009   Moderate antral gastritis  . FOOT SURGERY     R foot bunionectomy and arthroplasty of digits R foot.  Arthroplasty digits 3 and 4 L foot.  Marland Kitchen HAMMER TOE SURGERY     bilateral  . left tarsal tunnel release     sept '11 (Dr Beola Cord)  . radiofrequency neurotomy  06/18/2015   bilat L3-4 medial branch nerve and bilat L5 dorsal ramus nerve.(Dr. Nelva Bush)  . TONSILLECTOMY    . TOTAL ABDOMINAL HYSTERECTOMY W/ BILATERAL SALPINGOOPHORECTOMY    . TRANSTHORACIC ECHOCARDIOGRAM  08/2011; 2017   2013 Grade I DD; 2017 EF 60-65%, normal wall motion, grd II DD.    OB History    No data available       Home Medications    Prior to Admission medications   Medication Sig Start Date End Date Taking? Authorizing Provider  albuterol (PROVENTIL HFA;VENTOLIN HFA) 108 (90 Base) MCG/ACT inhaler Inhale 1 puff into the lungs every 6 (six) hours as needed for wheezing or shortness of breath. 03/30/16   Brunetta Jeans, PA-C  ALPRAZolam Duanne Moron) 0.25 MG tablet Take 0.25 mg by mouth at bedtime as needed for anxiety.    Historical Provider, MD  aspirin 81 MG tablet Take 81 mg by mouth daily.     Historical Provider, MD  atorvastatin (LIPITOR) 20 MG tablet TAKE ONE TABLET BY MOUTH DAILY 02/29/16   Fay Records, MD  blood glucose meter kit and supplies Check blood sugar once every morning. 11/08/15   Tammi Sou, MD  CALCIUM PO Take 1 tablet by mouth daily.    Historical Provider, MD  cetirizine (ZYRTEC) 10 MG tablet Take 10 mg by mouth as needed for allergies.    Historical Provider, MD  Cyanocobalamin (VITAMIN B-12 PO) Take by mouth.    Historical Provider, MD  diphenoxylate-atropine (LOMOTIL) 2.5-0.025 MG tablet Take 2 tablets by mouth every 6 (six) hours as needed for diarrhea or loose stools. 07/25/16   Laban Emperor Zehr, PA-C  DULoxetine (CYMBALTA) 60 MG capsule TAKE ONE CAPSULE BY MOUTH DAILY 04/03/16  Tammi Sou, MD  estradiol (ESTRACE) 1 MG tablet Take 1 tablet (1 mg total) by mouth daily. 03/09/16   Tammi Sou, MD  HYDROcodone-acetaminophen (NORCO/VICODIN) 5-325 MG per tablet Take 1 tablet by mouth every 6 (six) hours as needed for moderate pain.    Historical Provider, MD  levothyroxine (SYNTHROID, LEVOTHROID) 25 MCG tablet TAKE ONE TABLET BY MOUTH DAILY 08/30/15   Tammi Sou, MD  loperamide (IMODIUM) 2 MG capsule Take 2 mg by mouth as needed for diarrhea or loose stools. Reported on 05/10/2015    Historical Provider, MD  losartan-hydrochlorothiazide (HYZAAR) 50-12.5 MG tablet Take 1 tablet by mouth daily. 03/28/16   Tammi Sou, MD  LYRICA 50 MG capsule TAKE ONE CAPSULE BY MOUTH TWICE DAILY 04/17/16   Tammi Sou, MD  meloxicam (MOBIC) 15 MG tablet TAKE ONE TABLET BY MOUTH DAILY AS NEEDED FOR PAIN 08/30/15   Tammi Sou, MD  metFORMIN (GLUCOPHAGE) 1000 MG tablet TAKE ONE TABLET BY MOUTH TWICE DAILY 06/12/16   Tammi Sou, MD  metoprolol (LOPRESSOR) 50 MG tablet Take 1 tablet (50 mg total) by mouth 2 (two) times daily. 03/09/16   Tammi Sou, MD  mirtazapine (REMERON) 30 MG tablet TAKE 1/2 TO 1 TABLET BY MOUTH AT BEDTIME FOR pruitis 09/27/15   Tammi Sou, MD  Multiple Vitamins-Minerals (MULTIVITAMIN ADULT PO) Take by mouth.    Historical Provider, MD  omeprazole (PRILOSEC) 20 MG capsule TAKE ONE CAPSULE BY MOUTH DAILY 06/07/16   Tammi Sou, MD  OVER THE COUNTER MEDICATION Eye Vitamin - Areds2    Historical Provider, MD  potassium chloride SA (K-DUR,KLOR-CON) 20 MEQ tablet TAKE ONE TABLET BY MOUTH DAILY 04/03/16   Tammi Sou, MD  rifaximin (XIFAXAN) 550 MG TABS tablet Take 1 tablet (550 mg total) by mouth 3 (three) times daily. 07/26/16   Loralie Champagne, PA-C    Family History Family History  Problem Relation Age of Onset  . Cancer Mother     Breast  . Hypertension Father   . Hyperlipidemia Father   . Diabetes Maternal Grandfather   . Colon cancer Neg Hx     Social History Social History  Substance Use Topics  . Smoking status: Former Smoker    Packs/day: 1.00    Years: 35.00    Types: Cigarettes    Quit date: 03/27/2002  . Smokeless tobacco: Former Systems developer    Quit date: 07/01/2005  . Alcohol use No     Allergies   Gabapentin and Sulfonamide derivatives   Review of Systems Review of Systems Ten systems reviewed and are negative for acute change, except as noted in the HPI.   Physical Exam Updated Vital Signs BP (!) 129/56   Pulse 65   Temp 98.1 F (36.7 C) (Oral)   Resp (!) 22   Ht _0  (1.575 m)   Wt 79.8 kg   SpO2 94%   BMI 32.19 kg/m   Physical Exam  Constitutional: She is oriented to person, place, and time. She appears well-developed and well-nourished. No distress.  HENT:  Head: Normocephalic and atraumatic.  Eyes: Conjunctivae are normal. No scleral icterus.  Neck: Normal range of motion.  Cardiovascular: Normal rate, regular rhythm and normal heart sounds.  Exam reveals no gallop and no friction rub.   No murmur heard. Pulmonary/Chest: Effort normal and breath sounds normal. No respiratory distress. She exhibits tenderness and bony tenderness. She exhibits no crepitus and no deformity.  Abdominal: Soft. Bowel sounds are normal. She exhibits no distension and no mass. There is no tenderness. There is no guarding.  Neurological: She is alert and oriented to person, place, and time.  Skin: Skin is warm and dry. She is not diaphoretic.  Psychiatric: Her behavior is normal.  Nursing note and vitals reviewed.    ED Treatments / Results  Labs (all labs ordered are listed, but only abnormal results are displayed) Labs Reviewed  CBC - Abnormal; Notable for the following:       Result Value   RBC 3.74 (*)    Hemoglobin 11.8 (*)    HCT 35.1 (*)    All other components within normal limits  BASIC METABOLIC PANEL  TROPONIN I    EKG  EKG Interpretation  Date/Time:  Wednesday Jul 26 2016 11:09:27 EDT Ventricular Rate:  64 PR Interval:  210 QRS Duration: 70 QT Interval:  380 QTC Calculation: 392 R Axis:   33 Text Interpretation:  Sinus rhythm with 1st degree A-V block with Premature atrial complexes Low voltage QRS Cannot rule out Anterior infarct , age undetermined Abnormal ECG No prior ECG for comparison.  No STEMI Confirmed by Sherry Ruffing MD, [place] [phone]) on 07/26/2016 11:49:17 AM       Radiology Dg Chest 2 View  Result Date: 07/26/2016 CLINICAL DATA:  Chest pain. EXAM: CHEST  2 VIEW COMPARISON:  07/10/2016 FINDINGS: Chronic cardiomegaly. Mild scarring or atelectasis along the left heart border. There is no edema, consolidation, effusion, or pneumothorax. Stable mediastinal contours with aortic tortuosity. Atherosclerosis. Rounded density projecting inferior to the liver in the lateral projection may be gastric antrum or transverse colon. IMPRESSION: No evidence of active disease. Electronically Signed   By: Monte Fantasia M.D.   On: 07/26/2016 11:22    Procedures Procedures (including critical care time)  Medications Ordered in ED Medications  aspirin chewable tablet 324 mg (243 mg Oral Given 07/26/16 1207)     Initial Impression / Assessment and Plan /  ED Course  I have reviewed the triage vital signs and the nursing notes.  Pertinent labs & imaging results that were available during my care of the patient were reviewed by me and considered in my medical decision making (see chart for details).  Clinical Course as of Jul 26 1298  Wed Jul 26, 2016  1202 DG Chest 2 View [AH]    Clinical Course User Index [AH] Margarita Mail, PA-C    Patient Chest x-ray is negative and improved from previous chest x-ray about 3 weeks ago. D-dimer is negative. Patient has had 2 negative troponins here in the emergency department. The labs available to me at this time are otherwise negative. I was unable to obtain hepatic function labs. Given the pain in her right lower thoracic chest wall, however, patient did not have any tenderness to palpation on abdominal exam. The patient is chest pain-free. Her EKG has some small Q waves however, there is no evidence of acute ischemia on the EKG. The patient has had stable vital signs without any evidence of oxygen saturation. She is well appearing. Patient feels safe for discharge at this time and would like to leave immediately, so she can go pick up her CPAP machine. She has close follow-up as an outpatient. I have advised the patient to follow-up with her PCP in the next 24 hours.Patient seen in shared visit with attending physician. Who agrees with assessment, work up , treatment, and plan for discharge.    Final Clinical  Impressions(s) / ED Diagnoses   Final diagnoses:  Chest tightness or pressure    New Prescriptions New Prescriptions   No medications on file     Margarita Mail, PA-C 07/26/16 [place], MD 07/28/16 1348

## 2016-07-26 NOTE — Telephone Encounter (Signed)
Patient Name: Katherine Best  DOB: 1933/04/21    Initial Comment Caller states her chest is tight.   Nurse Assessment  Nurse: Julien Girt, RN, Almyra Free Date/Time Eilene Ghazi Time): 07/26/2016 10:17:48 AM  Confirm and document reason for call. If symptomatic, describe symptoms. ---Caller states her chest is tight and she feels a lot of pressure. She has had these sx for the last 3-4 days.  Does the patient have any new or worsening symptoms? ---Yes  Will a triage be completed? ---Yes  Related visit to physician within the last 2 weeks? ---No  Does the PT have any chronic conditions? (i.e. diabetes, asthma, etc.) ---Yes  List chronic conditions. ---Hx pneumonia, Diabetic, Htn, Hypothyroid  Is this a behavioral health or substance abuse call? ---No     Guidelines    Guideline Title Affirmed Question Affirmed Notes  Chest Pain [1] Chest pain lasts > 5 minutes AND [2] age > 82    Final Disposition User   Call EMS 911 Now Julien Girt, RN, Almyra Free    Disagree/Comply: Disagree  Disagree/Comply Reason: Disagree with instructions   911 Outcome Documentation Julien Girt, RN, Almyra Free Reason: Caller and her husband refuse 911 or ED outcome, they want to talk to Dr. [name]

## 2016-07-26 NOTE — ED Notes (Signed)
ED Provider at bedside. 

## 2016-07-26 NOTE — ED Triage Notes (Signed)
c/o CP x 1 week-was treated for PNE approx 2 weeks ago-NAD-presents to triage in w/c

## 2016-07-26 NOTE — Telephone Encounter (Signed)
Spoke with [facility] Xifaxan will cost patient $500. Called patient to inform her and she would like Rx called back into the [facility] pharmacy. She will try to see about 2 day shipping when they call to confirm.

## 2016-07-27 DIAGNOSIS — G4733 Obstructive sleep apnea (adult) (pediatric): Secondary | ICD-10-CM

## 2016-08-01 ENCOUNTER — Other Ambulatory Visit: Payer: Self-pay | Admitting: *Deleted

## 2016-08-01 ENCOUNTER — Telehealth: Payer: Self-pay | Admitting: Pulmonary Disease

## 2016-08-01 DIAGNOSIS — R0683 Snoring: Secondary | ICD-10-CM

## 2016-08-01 DIAGNOSIS — G4733 Obstructive sleep apnea (adult) (pediatric): Secondary | ICD-10-CM | POA: Diagnosis not present

## 2016-08-01 NOTE — Telephone Encounter (Signed)
Spoke with pt, aware of recs.  Pt wishes to be seen in clinic before ordering CPAP.  Scheduled appt with TP on 5/21 (pt is currently traveling), will wait to order cpap until office visit.  Nothing further needed.

## 2016-08-01 NOTE — Telephone Encounter (Signed)
HST 07/27/16 >> AHI 6.2, SaO2 low 79%  Will have my nurse inform pt that sleep study shows mild sleep apnea.  Options are 1) CPAP now, 2) ROV first.  If She is agreeable to CPAP, then please send order for auto CPAP range 5 to 15 cm H2O with heated humidity and mask of choice.  Have download sent 1 month after starting CPAP and set up ROV 2 months after starting CPAP.  ROV can be with me or NP.

## 2016-08-01 NOTE — Telephone Encounter (Signed)
Patient calling for results [phone] (cell).

## 2016-08-08 ENCOUNTER — Encounter: Payer: Self-pay | Admitting: Family Medicine

## 2016-08-09 ENCOUNTER — Other Ambulatory Visit: Payer: Self-pay | Admitting: *Deleted

## 2016-08-09 ENCOUNTER — Encounter: Payer: Self-pay | Admitting: Family Medicine

## 2016-08-09 MED ORDER — LANCETS 30G MISC: 11 refills | Status: DC

## 2016-08-09 MED ORDER — GLUCOSE BLOOD VI STRP: ORAL_STRIP | 11 refills | Status: DC

## 2016-08-09 NOTE — Telephone Encounter (Signed)
Please advise. Thanks.  

## 2016-08-09 NOTE — Telephone Encounter (Signed)
[facility] requesting one touch ultra test strips and lancets.

## 2016-08-14 ENCOUNTER — Ambulatory Visit (INDEPENDENT_AMBULATORY_CARE_PROVIDER_SITE_OTHER): Payer: Medicare Other | Admitting: Adult Health

## 2016-08-14 ENCOUNTER — Encounter: Payer: Self-pay | Admitting: Adult Health

## 2016-08-14 VITALS — BP 116/64 | HR 84 | Ht 62.0 in | Wt 177.8 lb

## 2016-08-14 DIAGNOSIS — G4733 Obstructive sleep apnea (adult) (pediatric): Secondary | ICD-10-CM

## 2016-08-14 DIAGNOSIS — I25810 Atherosclerosis of coronary artery bypass graft(s) without angina pectoris: Secondary | ICD-10-CM

## 2016-08-14 DIAGNOSIS — E669 Obesity, unspecified: Secondary | ICD-10-CM

## 2016-08-14 NOTE — Patient Instructions (Addendum)
Begin CPAP At bedtime  .  Wear for at least 4-6 hr each night .  Work on weight loss.  Do not drive if sleepy .  Follow up with Dr. Halford Chessman  Or [place] in 2 months and As needed

## 2016-08-14 NOTE — Progress Notes (Signed)
@Patient  ID: Katherine Best, female    DOB: 09-17-1933, 83 y.o.   MRN: [id number]  Chief Complaint  Patient presents with  . Follow-up    OSA    Referring provider: Tammi Sou, MD  HPI: 83 year old female seen for sleep consult 08/16/2016  TEST  HST 07/27/16 >> AHI 6.2, SaO2 low 79%  08/14/2016 Follow up : OSA  Patient presents for a follow-up. Patient recently had a home sleep study done on 07/27/2016 that showed mild sleep apnea with AHI 6.2, SaO2 low 79%. We discussed test results.Martin Majestic over treatment options including oral appliance , C Pap and weight loss. She has daytime sleepiness, snoring and nighttime sleep interruptions.  Allergies  Allergen Reactions  . Gabapentin Itching  . Sulfonamide Derivatives     REACTION: Nausea    Immunization History  Administered Date(s) Administered  . Influenza Split 12/06/2011  . Influenza Whole 01/25/2001, 01/08/2009, 01/25/2009, 12/01/2009, 11/26/2010, 11/26/2011  . Influenza, High Dose Seasonal PF 01/30/2013, 01/14/2014, 12/30/2014, 12/17/2015  . Pneumococcal Conjugate-13 09/02/2014  . Pneumococcal Polysaccharide-23 01/25/2001, 06/25/2008, 01/10/2012  . Tdap 06/14/2010  . Zoster 06/26/2007    Past Medical History:  Diagnosis Date  . Abdominal pain, right lower quadrant   . Anxiety   . Bursitis   . Cataracts, both eyes    soon to have cataract surg as of 03/2015  . Depression   . Diabetes mellitus with skin complications ([place])   . Diverticulosis of colon    noted on colonoscopy 2008  . Dyspnea    a. 09/2011 CTA Chest: No PE, Ca2+ cors;  b. 09/2011 R&L heart Cath: Relatively nl R heart pressures, nl co/ci, nonobs cad, nl EF.  12/2015 stress test normal, echo with grd II DD--likely explains DOE.  Marland Kitchen External hemorrhoid   . Fatigue    w/ ? excessive daytime somnolence; ? OSA--eval by Dr. Halford Chessman 06/2016, home sleep study ordered.  . Former smoker    30 pack-yrs, quit 2005  . History of adenomatous polyp of colon   . Hot  flashes    restarted estradiol 01/2015  . Hyperlipidemia   . Hypertension   . Hypothyroidism   . IBS (irritable bowel syndrome)    IBS-D (Dr. Carlean Purl)  . Interstitial cystitis   . Liver function study, abnormal   . Lumbar degenerative disc disease    Facet-mediated pain per Dr. Nelva Bush.  Has gotten repeated LB injections, most recently April 2016 in [place].  . Menopausal syndrome   . Morton's neuroma   . Obesity, unspecified   . Other specified gastritis without mention of hemorrhage    hx of antral gastritis on EGD 05/2009  . Peripheral neuropathy    per EMG 11/2012.  Intolerant of gabapentin, failed pamelor.  Radiofrequency neurotomy 12/11/14 helped LB pain (Dr. Nelva Bush)  . Recurrent UTI    on daily antibiotic prophylaxis by her urologist.  . RLS (restless legs syndrome)   . Tarsal tunnel syndrome   . Unspecified venous (peripheral) insufficiency     Tobacco History: History  Smoking Status  . Former Smoker  . Packs/day: 1.00  . Years: 35.00  . Types: Cigarettes  . Quit date: 03/27/2002  Smokeless Tobacco  . Former Systems developer  . Quit date: 07/01/2005   Counseling given: Not Answered   Outpatient Encounter Prescriptions as of 08/14/2016  Medication Sig  . albuterol (PROVENTIL HFA;VENTOLIN HFA) 108 (90 Base) MCG/ACT inhaler Inhale 1 puff into the lungs every 6 (six) hours as needed for wheezing or  shortness of breath.  . ALPRAZolam (XANAX) 0.25 MG tablet Take 0.25 mg by mouth at bedtime as needed for anxiety.  Marland Kitchen aspirin 81 MG tablet Take 81 mg by mouth daily.  Marland Kitchen atorvastatin (LIPITOR) 20 MG tablet TAKE ONE TABLET BY MOUTH DAILY  . blood glucose meter kit and supplies Check blood sugar once every morning.  Marland Kitchen CALCIUM PO Take 1 tablet by mouth daily.  . cetirizine (ZYRTEC) 10 MG tablet Take 10 mg by mouth as needed for allergies.  . Cyanocobalamin (VITAMIN B-12 PO) Take by mouth.  . diphenoxylate-atropine (LOMOTIL) 2.5-0.025 MG tablet Take 2 tablets by mouth every 6 (six) hours as needed  for diarrhea or loose stools.  . DULoxetine (CYMBALTA) 60 MG capsule TAKE ONE CAPSULE BY MOUTH DAILY  . estradiol (ESTRACE) 1 MG tablet Take 1 tablet (1 mg total) by mouth daily.  Marland Kitchen glucose blood test strip Use to check blood sugar once daily  . HYDROcodone-acetaminophen (NORCO/VICODIN) 5-325 MG per tablet Take 1 tablet by mouth every 6 (six) hours as needed for moderate pain.  . Lancets 30G MISC Use to check blood sugar once daily  . levothyroxine (SYNTHROID, LEVOTHROID) 25 MCG tablet TAKE ONE TABLET BY MOUTH DAILY  . loperamide (IMODIUM) 2 MG capsule Take 2 mg by mouth as needed for diarrhea or loose stools. Reported on 05/10/2015  . losartan-hydrochlorothiazide (HYZAAR) 50-12.5 MG tablet Take 1 tablet by mouth daily.  Marland Kitchen LYRICA 50 MG capsule TAKE ONE CAPSULE BY MOUTH TWICE DAILY  . meloxicam (MOBIC) 15 MG tablet TAKE ONE TABLET BY MOUTH DAILY AS NEEDED FOR PAIN  . metFORMIN (GLUCOPHAGE) 1000 MG tablet TAKE ONE TABLET BY MOUTH TWICE DAILY  . metoprolol (LOPRESSOR) 50 MG tablet Take 1 tablet (50 mg total) by mouth 2 (two) times daily.  . mirtazapine (REMERON) 30 MG tablet TAKE 1/2 TO 1 TABLET BY MOUTH AT BEDTIME FOR pruitis  . Multiple Vitamins-Minerals (MULTIVITAMIN ADULT PO) Take by mouth.  Marland Kitchen omeprazole (PRILOSEC) 20 MG capsule TAKE ONE CAPSULE BY MOUTH DAILY  . OVER THE COUNTER MEDICATION Eye Vitamin - Areds2  . potassium chloride SA (K-DUR,KLOR-CON) 20 MEQ tablet TAKE ONE TABLET BY MOUTH DAILY  . rifaximin (XIFAXAN) 550 MG TABS tablet Take 1 tablet (550 mg total) by mouth 3 (three) times daily.   No facility-administered encounter medications on file as of 08/14/2016.      Review of Systems  Constitutional:   No  weight loss, night sweats,  Fevers, chills,  +fatigue, or  lassitude.  HEENT:   No headaches,  Difficulty swallowing,  Tooth/dental problems, or  Sore throat,                No sneezing, itching, ear ache, nasal congestion, post nasal drip,   CV:  No chest pain,  Orthopnea,  PND, swelling in lower extremities, anasarca, dizziness, palpitations, syncope.   GI  No heartburn, indigestion, abdominal pain, nausea, vomiting, diarrhea, change in bowel habits, loss of appetite, bloody stools.   Resp: No shortness of breath with exertion or at rest.  No excess mucus, no productive cough,  No non-productive cough,  No coughing up of blood.  No change in color of mucus.  No wheezing.  No chest wall deformity  Skin: no rash or lesions.  GU: no dysuria, change in color of urine, no urgency or frequency.  No flank pain, no hematuria   MS:  No joint pain or swelling.  No decreased range of motion.  No back pain.  Physical Exam  BP 116/64 (BP Location: Left Arm, Cuff Size: Normal)   Pulse 84   Ht 5' 2"  (1.575 m)   Wt 177 lb 12.8 oz (80.6 kg)   SpO2 94%   BMI 32.52 kg/m   GEN: A/Ox3; pleasant , NAD obese    HEENT:  [place]/AT,  EACs-clear, TMs-wnl, NOSE-clear, THROAT-clear, no lesions, no postnasal drip or exudate noted. Class 2-3 MP airway   NECK:  Supple w/ fair ROM; no JVD; normal carotid impulses w/o bruits; no thyromegaly or nodules palpated; no lymphadenopathy.    RESP  Clear  P & A; w/o, wheezes/ rales/ or rhonchi. no accessory muscle use, no dullness to percussion  CARD:  RRR, no m/r/g, no peripheral edema, pulses intact, no cyanosis or clubbing.  GI:   Soft & nt; nml bowel sounds; no organomegaly or masses detected.   Musco: Warm bil, no deformities or joint swelling noted.   Neuro: alert, no focal deficits noted.    Skin: Warm, no lesions or rashes    BNP No results found for: BNP  ProBNP  Imaging: Dg Chest 2 View  Result Date: 07/26/2016 CLINICAL DATA:  Chest pain. EXAM: CHEST  2 VIEW COMPARISON:  07/10/2016 FINDINGS: Chronic cardiomegaly. Mild scarring or atelectasis along the left heart border. There is no edema, consolidation, effusion, or pneumothorax. Stable mediastinal contours with aortic tortuosity. Atherosclerosis. Rounded density  projecting inferior to the liver in the lateral projection may be gastric antrum or transverse colon. IMPRESSION: No evidence of active disease. Electronically Signed   By: Monte Fantasia M.D.   On: 07/26/2016 11:22     Assessment & Plan:   No problem-specific Assessment & Plan notes found for this encounter.     Rexene Edison, NP 08/14/2016

## 2016-08-14 NOTE — Assessment & Plan Note (Signed)
Wt loss  

## 2016-08-14 NOTE — Assessment & Plan Note (Signed)
Mild OSA with significant associated hypoxia   Plan  Patient Instructions  Begin CPAP At bedtime  .  Wear for at least 4-6 hr each night .  Work on weight loss.  Do not drive if sleepy .  Follow up with Dr. Halford Chessman  Or [place] in 2 months and As needed

## 2016-08-15 ENCOUNTER — Encounter: Payer: Self-pay | Admitting: Family Medicine

## 2016-08-15 NOTE — Progress Notes (Signed)
I have reviewed and agree with assessment/plan.  Chesley Mires, MD [facility] Pulmonary/Critical Care 08/15/2016, 2:54 PM Pager:  [phone]

## 2016-08-17 ENCOUNTER — Encounter: Payer: Self-pay | Admitting: Internal Medicine

## 2016-08-17 ENCOUNTER — Ambulatory Visit (AMBULATORY_SURGERY_CENTER): Payer: Medicare Other | Admitting: Internal Medicine

## 2016-08-17 ENCOUNTER — Encounter: Payer: Medicare Other | Admitting: Internal Medicine

## 2016-08-17 VITALS — BP 145/62 | HR 73 | Temp 97.3°F | Resp 25 | Ht 62.0 in | Wt 176.0 lb

## 2016-08-17 DIAGNOSIS — R197 Diarrhea, unspecified: Secondary | ICD-10-CM | POA: Diagnosis not present

## 2016-08-17 DIAGNOSIS — K589 Irritable bowel syndrome without diarrhea: Secondary | ICD-10-CM | POA: Diagnosis not present

## 2016-08-17 DIAGNOSIS — D126 Benign neoplasm of colon, unspecified: Secondary | ICD-10-CM

## 2016-08-17 DIAGNOSIS — D124 Benign neoplasm of descending colon: Secondary | ICD-10-CM | POA: Diagnosis not present

## 2016-08-17 DIAGNOSIS — E669 Obesity, unspecified: Secondary | ICD-10-CM | POA: Diagnosis not present

## 2016-08-17 DIAGNOSIS — Z1212 Encounter for screening for malignant neoplasm of rectum: Secondary | ICD-10-CM

## 2016-08-17 DIAGNOSIS — Z1211 Encounter for screening for malignant neoplasm of colon: Secondary | ICD-10-CM

## 2016-08-17 DIAGNOSIS — I2510 Atherosclerotic heart disease of native coronary artery without angina pectoris: Secondary | ICD-10-CM | POA: Diagnosis not present

## 2016-08-17 DIAGNOSIS — I10 Essential (primary) hypertension: Secondary | ICD-10-CM | POA: Diagnosis not present

## 2016-08-17 DIAGNOSIS — E119 Type 2 diabetes mellitus without complications: Secondary | ICD-10-CM | POA: Diagnosis not present

## 2016-08-17 DIAGNOSIS — K635 Polyp of colon: Secondary | ICD-10-CM | POA: Diagnosis not present

## 2016-08-17 DIAGNOSIS — D123 Benign neoplasm of transverse colon: Secondary | ICD-10-CM

## 2016-08-17 DIAGNOSIS — G4733 Obstructive sleep apnea (adult) (pediatric): Secondary | ICD-10-CM | POA: Diagnosis not present

## 2016-08-17 MED ORDER — SODIUM CHLORIDE 0.9 % IV SOLN: 500.0000 mL | INTRAVENOUS | Status: DC

## 2016-08-17 NOTE — Progress Notes (Signed)
Called to room to assist during endoscopic procedure.  Patient ID and intended procedure confirmed with present staff. Received instructions for my participation in the procedure from the performing physician.  

## 2016-08-17 NOTE — Patient Instructions (Addendum)
I removed 23 tiny polyps and saw your diverticulosis.  Colon lining very healthy in appearance. No inflammation.  I think your symptoms are IBS with some possible component of the metformin aggravating things.  Options are: 1) Use loperamide 1-2 tabs every day (Imodium), and holding if constipated 2) change from metformin 3) Take colestipol daily  I suggest you try # 1 first  Let me know if any ? - I can communicate through My chart  You should not need another colonoscopy  I appreciate the opportunity to care for this patient. Gatha Mayer, MD, FACG        YOU HAD AN ENDOSCOPIC PROCEDURE TODAY AT [facility]:   Refer to the procedure report that was given to you for any specific questions about what was found during the examination.  If the procedure report does not answer your questions, please call your gastroenterologist to clarify.  If you requested that your care partner not be given the details of your procedure findings, then the procedure report has been included in a sealed envelope for you to review at your convenience later.  YOU SHOULD EXPECT: Some feelings of bloating in the abdomen. Passage of more gas than usual.  Walking can help get rid of the air that was put into your GI tract during the procedure and reduce the bloating. If you had a lower endoscopy (such as a colonoscopy or flexible sigmoidoscopy) you may notice spotting of blood in your stool or on the toilet paper. If you underwent a bowel prep for your procedure, you may not have a normal bowel movement for a few days.  Please Note:  You might notice some irritation and congestion in your nose or some drainage.  This is from the oxygen used during your procedure.  There is no need for concern and it should clear up in a day or so.  SYMPTOMS TO REPORT IMMEDIATELY:   Following lower endoscopy (colonoscopy or flexible sigmoidoscopy):  Excessive amounts of blood in the  stool  Significant tenderness or worsening of abdominal pains  Swelling of the abdomen that is new, acute  Fever of 100F or higher    For urgent or emergent issues, a gastroenterologist can be reached at any hour by calling [phone].   DIET:  We do recommend a small meal at first, but then you may proceed to your regular diet.  Drink plenty of fluids but you should avoid alcoholic beverages for 24 hours.  ACTIVITY:  You should plan to take it easy for the rest of today and you should NOT DRIVE or use heavy machinery until tomorrow (because of the sedation medicines used during the test).    FOLLOW UP: Our staff will call the number listed on your records the next business day following your procedure to check on you and address any questions or concerns that you may have regarding the information given to you following your procedure. If we do not reach you, we will leave a message.  However, if you are feeling well and you are not experiencing any problems, there is no need to return our call.  We will assume that you have returned to your regular daily activities without incident.  If any biopsies were taken you will be contacted by phone or by letter within the next 1-3 weeks.  Please call us at [phone] if you have not heard about the biopsies in 3 weeks.    SIGNATURES/CONFIDENTIALITY: You and/or  your care partner have signed paperwork which will be entered into your electronic medical record.  These signatures attest to the fact that that the information above on your After Visit Summary has been reviewed and is understood.  Full responsibility of the confidentiality of this discharge information lies with you and/or your care-partner.   Resume medications. Information given on polyps and diverticulosis.

## 2016-08-17 NOTE — Progress Notes (Signed)
To recovery, report to Brown, RN, VSS. 

## 2016-08-17 NOTE — Op Note (Signed)
[facility] Patient Name: Katherine Best Procedure Date: 08/17/2016 9:37 AM MRN: [id number] Endoscopist: Gatha Mayer , MD Age: 83 Referring MD:  Date of Birth: Jun 15, 1933 Gender: Female Account #: [id number] Procedure:                Colonoscopy Indications:              Screening for colorectal malignant neoplasm, Last                            colonoscopy: 2008 Medicines:                Propofol per Anesthesia, Monitored Anesthesia Care Procedure:                Pre-Anesthesia Assessment:                           - Prior to the procedure, a History and Physical                            was performed, and patient medications and                            allergies were reviewed. The patient's tolerance of                            previous anesthesia was also reviewed. The risks                            and benefits of the procedure and the sedation                            options and risks were discussed with the patient.                            All questions were answered, and informed consent                            was obtained. Prior Anticoagulants: The patient has                            taken no previous anticoagulant or antiplatelet                            agents. ASA Grade Assessment: III - A patient with                            severe systemic disease. After reviewing the risks                            and benefits, the patient was deemed in                            satisfactory condition to undergo the procedure.  After obtaining informed consent, the colonoscope                            was passed under direct vision. Throughout the                            procedure, the patient's blood pressure, pulse, and                            oxygen saturations were monitored continuously. The                            Model PCF-H190DL [id number]) scope was introduced                            through the  anus and advanced to the the terminal                            ileum, with identification of the appendiceal                            orifice and IC valve. The colonoscopy was performed                            without difficulty. The patient tolerated the                            procedure well. The quality of the bowel                            preparation was good. The bowel preparation used                            was Miralax. The terminal ileum, ileocecal valve,                            appendiceal orifice, and rectum were photographed. Scope In: 9:46:10 AM Scope Out: 9:59:28 AM Scope Withdrawal Time: 0 hours 10 minutes 54 seconds  Total Procedure Duration: 0 hours 13 minutes 18 seconds  Findings:                 The perianal and digital rectal examinations were                            normal.                           Three sessile polyps were found in the descending                            colon and transverse colon. The polyps were                            diminutive in size. These polyps were removed with  a cold snare. Resection and retrieval were                            complete. Verification of patient identification                            for the specimen was done. Estimated blood loss was                            minimal.                           Many small and large-mouthed diverticula were found                            in the sigmoid colon. There was no evidence of                            diverticular bleeding.                           The terminal ileum appeared normal.                           The exam was otherwise without abnormality on                            direct and retroflexion views. Complications:            No immediate complications. Estimated Blood Loss:     Estimated blood loss was minimal. Impression:               - Three diminutive polyps in the descending colon                             and in the transverse colon, removed with a cold                            snare. Resected and retrieved.                           - Moderate diverticulosis in the sigmoid colon.                            There was no evidence of diverticular bleeding.                           - The examined portion of the ileum was normal.                           - The examination was otherwise normal on direct                            and retroflexion views. Recommendation:           - Patient has a contact number available for  emergencies. The signs and symptoms of potential                            delayed complications were discussed with the                            patient. Return to normal activities tomorrow.                            Written discharge instructions were provided to the                            patient.                           - Resume previous diet.                           - Continue present medications.                           - No repeat colonoscopy due to age. Gatha Mayer, MD 08/17/2016 10:14:17 AM This report has been signed electronically.

## 2016-08-18 ENCOUNTER — Telehealth: Payer: Self-pay

## 2016-08-18 NOTE — Telephone Encounter (Signed)
  Follow up Call-  Call back number 08/17/2016  Post procedure Call Back phone  # [phone]  Permission to leave phone message Yes  Some recent data might be hidden     Patient questions:  Do you have a fever, pain , or abdominal swelling? No. Pain Score  0 *  Have you tolerated food without any problems? Yes.    Have you been able to return to your normal activities? Yes.    Do you have any questions about your discharge instructions: Diet   No. Medications  No. Follow up visit  No.  Do you have questions or concerns about your Care? No.  Actions: * If pain score is 4 or above: No action needed, pain <4.

## 2016-08-20 ENCOUNTER — Encounter: Payer: Self-pay | Admitting: Family Medicine

## 2016-08-20 DIAGNOSIS — R2689 Other abnormalities of gait and mobility: Secondary | ICD-10-CM

## 2016-08-22 DIAGNOSIS — R2689 Other abnormalities of gait and mobility: Secondary | ICD-10-CM | POA: Insufficient documentation

## 2016-08-22 NOTE — Telephone Encounter (Signed)
Rx written and put on your keyboard.

## 2016-08-22 NOTE — Telephone Encounter (Signed)
Please advise. Thanks.  

## 2016-08-25 ENCOUNTER — Encounter: Payer: Self-pay | Admitting: Internal Medicine

## 2016-08-25 DIAGNOSIS — Z860101 Personal history of adenomatous and serrated colon polyps: Secondary | ICD-10-CM

## 2016-08-25 DIAGNOSIS — Z86010 Personal history of colonic polyps: Secondary | ICD-10-CM

## 2016-08-25 HISTORY — DX: Personal history of adenomatous and serrated colon polyps: Z86.0101

## 2016-08-25 HISTORY — DX: Personal history of colonic polyps: Z86.010

## 2016-08-25 NOTE — Progress Notes (Signed)
1 of 3 diminutive polyps adenoma and mucosal and lymphoid polyps No recall - age  And low risk findings My Chart letter

## 2016-08-28 ENCOUNTER — Ambulatory Visit: Payer: Self-pay | Admitting: Internal Medicine

## 2016-09-01 ENCOUNTER — Encounter: Payer: Self-pay | Admitting: Family Medicine

## 2016-09-04 ENCOUNTER — Other Ambulatory Visit: Payer: Self-pay | Admitting: Family Medicine

## 2016-09-15 ENCOUNTER — Encounter: Payer: Self-pay | Admitting: Family Medicine

## 2016-09-15 MED ORDER — PIOGLITAZONE HCL 15 MG PO TABS: 15.0000 mg | ORAL_TABLET | Freq: Every day | ORAL | 1 refills | Status: DC

## 2016-09-15 MED ORDER — METFORMIN HCL 1000 MG PO TABS: ORAL_TABLET | ORAL | 0 refills | Status: DC

## 2016-09-15 NOTE — Telephone Encounter (Signed)
Patient notified and verbalized understanding. 

## 2016-09-15 NOTE — Telephone Encounter (Signed)
Continue taking the morning dose of metformin 1000 mg.  I have also sent in a rx for new med to help with her diabetes called pioglitazone (generic actos). She take one tab per day (without regard to meals). -thx

## 2016-09-20 DIAGNOSIS — M1711 Unilateral primary osteoarthritis, right knee: Secondary | ICD-10-CM | POA: Diagnosis not present

## 2016-09-20 DIAGNOSIS — G8929 Other chronic pain: Secondary | ICD-10-CM | POA: Diagnosis not present

## 2016-09-20 DIAGNOSIS — M79672 Pain in left foot: Secondary | ICD-10-CM | POA: Diagnosis not present

## 2016-09-20 DIAGNOSIS — M2042 Other hammer toe(s) (acquired), left foot: Secondary | ICD-10-CM | POA: Diagnosis not present

## 2016-09-20 DIAGNOSIS — L602 Onychogryphosis: Secondary | ICD-10-CM | POA: Diagnosis not present

## 2016-09-20 DIAGNOSIS — M25561 Pain in right knee: Secondary | ICD-10-CM | POA: Diagnosis not present

## 2016-09-20 DIAGNOSIS — E1351 Other specified diabetes mellitus with diabetic peripheral angiopathy without gangrene: Secondary | ICD-10-CM | POA: Diagnosis not present

## 2016-09-25 ENCOUNTER — Other Ambulatory Visit: Payer: Self-pay | Admitting: Family Medicine

## 2016-09-25 ENCOUNTER — Encounter: Payer: Self-pay | Admitting: Family Medicine

## 2016-09-26 ENCOUNTER — Telehealth: Payer: Self-pay

## 2016-09-26 NOTE — Telephone Encounter (Signed)
Returned call to patient informing her that Dr. [name] is out of the office until Monday and she stated that she was fine with medications and that her Glucose was running between 136 and 140.

## 2016-10-01 NOTE — Telephone Encounter (Signed)
I got a message after this MyChart note saying that pt was "fine with her medication" and no longer was needing a change to metformin XR.  I'm just routing this note back to you so it can be closed.-thx

## 2016-10-01 NOTE — Telephone Encounter (Signed)
Noted  

## 2016-10-02 ENCOUNTER — Telehealth: Payer: Self-pay | Admitting: Adult Health

## 2016-10-02 NOTE — Telephone Encounter (Signed)
Spoke with pt. States that she is having issues with her CPAP machine. Reports waking up at night choking, sinus congestion and dry mouth. Pt states that she is trying to wear the CPAP at least 6 hours per night. She would like TP's recommendations.  TP - please advise. Thanks.

## 2016-10-02 NOTE — Telephone Encounter (Signed)
Per TP: let's have a download sent and try a mask fitting.  If this doesn't work, she'll need a sooner ov.  Thanks.  Called spoke with patient who was very argumentative on the phone.  She reports she has already contacted her homecare company, and tried a couple of masks with no success.  TP offered nasal spray but pt refused this therapy stating that she does not like nasal sprays - they "stop up her sinuses and she can't breathe and they make her blood sugar run up."  Advised patient that at this point a sooner office visit would be beneficial.  Pt stated she would like to just stop wearing the CPAP machine because she has not been sleeping well since she started it - advised pt against this.  Pt stated she will not wear the CPAP tonight so she can "get at least 1 good night's sleep."  Pt requesting to see VS but there are no openings.  Dr Halford Chessman, are there any additional recommendations for patient?  She is already scheduled to see TP for new CPAP follow up on 7.23.18.

## 2016-10-03 ENCOUNTER — Encounter: Payer: Self-pay | Admitting: Family Medicine

## 2016-10-03 NOTE — Telephone Encounter (Signed)
She has very mild sleep apnea.  It is okay for her to hold off on using CPAP for now until she can be reassessed at next ROV.  Okay to work her in with me and double book visit.

## 2016-10-03 NOTE — Telephone Encounter (Signed)
Spoke with patient. She is aware of VS' recs. She stated that she did not use CPAP last night and it was best night of sleep she's had in a while. Nothing else was needed at time of call.

## 2016-10-04 ENCOUNTER — Telehealth: Payer: Self-pay | Admitting: Internal Medicine

## 2016-10-04 DIAGNOSIS — M47816 Spondylosis without myelopathy or radiculopathy, lumbar region: Secondary | ICD-10-CM | POA: Diagnosis not present

## 2016-10-04 MED ORDER — METFORMIN HCL ER (MOD) 1000 MG PO TB24: 1000.0000 mg | ORAL_TABLET | Freq: Every day | ORAL | 6 refills | Status: DC

## 2016-10-04 NOTE — Telephone Encounter (Signed)
Please advise. Thanks.  

## 2016-10-04 NOTE — Telephone Encounter (Signed)
Metformin ER generic eRx'd. Stop her current metformin. However, I recommend she KEEP taking her pioglitazone (actos) 15mg  once daily.--thx

## 2016-10-04 NOTE — Telephone Encounter (Signed)
Walk In pt Form-[facility] dropped off placed in [place].

## 2016-10-09 ENCOUNTER — Ambulatory Visit: Payer: Self-pay | Admitting: Family Medicine

## 2016-10-11 ENCOUNTER — Encounter: Payer: Self-pay | Admitting: Family Medicine

## 2016-10-11 ENCOUNTER — Other Ambulatory Visit: Payer: Self-pay | Admitting: Family Medicine

## 2016-10-11 ENCOUNTER — Ambulatory Visit (INDEPENDENT_AMBULATORY_CARE_PROVIDER_SITE_OTHER): Payer: Medicare Other | Admitting: Family Medicine

## 2016-10-11 VITALS — BP 111/62 | HR 61 | Temp 97.6°F | Resp 16 | Ht 62.0 in | Wt 180.8 lb

## 2016-10-11 DIAGNOSIS — K580 Irritable bowel syndrome with diarrhea: Secondary | ICD-10-CM | POA: Diagnosis not present

## 2016-10-11 DIAGNOSIS — Z0000 Encounter for general adult medical examination without abnormal findings: Secondary | ICD-10-CM

## 2016-10-11 DIAGNOSIS — I10 Essential (primary) hypertension: Secondary | ICD-10-CM | POA: Diagnosis not present

## 2016-10-11 DIAGNOSIS — D649 Anemia, unspecified: Secondary | ICD-10-CM

## 2016-10-11 DIAGNOSIS — M170 Bilateral primary osteoarthritis of knee: Secondary | ICD-10-CM | POA: Diagnosis not present

## 2016-10-11 DIAGNOSIS — I25810 Atherosclerosis of coronary artery bypass graft(s) without angina pectoris: Secondary | ICD-10-CM | POA: Diagnosis not present

## 2016-10-11 DIAGNOSIS — E118 Type 2 diabetes mellitus with unspecified complications: Secondary | ICD-10-CM | POA: Diagnosis not present

## 2016-10-11 LAB — BASIC METABOLIC PANEL
BUN: 24 mg/dL — AB (ref 6–23)
CALCIUM: 9.4 mg/dL (ref 8.4–10.5)
CHLORIDE: 100 meq/L (ref 96–112)
CO2: 29 meq/L (ref 19–32)
CREATININE: 0.79 mg/dL (ref 0.40–1.20)
GFR: 74.31 mL/min (ref >60.00)
Glucose, Bld: 170 mg/dL — ABNORMAL HIGH (ref 70–99)
Potassium: 4.3 mEq/L (ref 3.5–5.1)
Sodium: 137 mEq/L (ref 135–145)

## 2016-10-11 LAB — MICROALBUMIN / CREATININE URINE RATIO
Creatinine,U: 104.7 mg/dL
Microalb Creat Ratio: 0.3 mg/g (ref 0.0–30.0)
Microalb, Ur: 0.3 mg/dL (ref 0.0–1.9)

## 2016-10-11 LAB — HEMOGLOBIN A1C: Hgb A1c MFr Bld: 6.8 % — ABNORMAL HIGH (ref 4.6–6.5)

## 2016-10-11 MED ORDER — MELOXICAM 15 MG PO TABS: ORAL_TABLET | ORAL | 1 refills | Status: DC

## 2016-10-11 MED ORDER — ZOSTER VAC RECOMB ADJUVANTED 50 MCG/0.5ML IM SUSR: 0.5000 mL | Freq: Once | INTRAMUSCULAR | 1 refills | Status: AC

## 2016-10-11 MED ORDER — PIOGLITAZONE HCL 15 MG PO TABS: 15.0000 mg | ORAL_TABLET | Freq: Every day | ORAL | 1 refills | Status: DC

## 2016-10-11 NOTE — Progress Notes (Signed)
OFFICE VISIT  10/11/2016   CC:  Chief Complaint  Patient presents with  . Follow-up    RCI, pt is not fasting.    HPI:    Patient is a 83 y.o. Caucasian female who presents for 3 month f/u DM 2, HTN, and IBS. Recent blood work showed mild normocytic anemia with borderline low iron stores.  Hemoccults neg x 3. Colonoscopy 08/17/16 showed a few diminutive polyps, no recall was recommended due to findings and age. Not on iron at this time.  DM 2: glucoses 120s lately with new extended release metformin.   Her diarrhea is improved with taking imodium once daily.  Also much improvement in loose BMs since changing to extended release metformin.  HTN: home bp monitoring = normal.  She recently got a home sleep study and pt states Dr. Halford Chessman told her she only had mild OSA--no CPAP recommended.  She is having unicompartmental knee arthroplasty on R knee soon and needs medical clearance. She has sent a clearance sheet to her cardiologist as well.  Review of Systems  Constitutional: Negative for fatigue and fever.  HENT: Negative for congestion and sore throat.   Eyes: Negative for visual disturbance.  Respiratory: Negative for cough and shortness of breath.   Cardiovascular: Negative for chest pain and palpitations.  Gastrointestinal: Negative for abdominal pain and nausea.  Endocrine: Negative for polydipsia, polyphagia and polyuria.  Genitourinary: Negative for dysuria.  Musculoskeletal: Negative for back pain and joint swelling.  Skin: Negative for rash.  Neurological: Negative for weakness and headaches.  Hematological: Negative for adenopathy.    Past Medical History:  Diagnosis Date  . Abdominal pain, right lower quadrant   . Anxiety   . Bursitis   . Cataracts, both eyes    soon to have cataract surg as of 03/2015  . Depression   . Diabetes mellitus with skin complications ([place])   . Diverticulosis of colon    noted on colonoscopy 2008  . Dyspnea    a. 09/2011 CTA Chest:  No PE, Ca2+ cors;  b. 09/2011 R&L heart Cath: Relatively nl R heart pressures, nl co/ci, nonobs cad, nl EF.  12/2015 stress test normal, echo with grd II DD--likely explains DOE.  Marland Kitchen External hemorrhoid   . Fatigue    w/ ? excessive daytime somnolence; ? OSA--eval by Dr. Halford Chessman 06/2016, home sleep study ordered.  . Former smoker    30 pack-yrs, quit 2005  . Hot flashes    restarted estradiol 01/2015  . Hx of adenomatous polyp of colon 08/25/2016   07/2016 no recall  . Hyperlipidemia   . Hypertension   . Hypothyroidism   . IBS (irritable bowel syndrome)    IBS-D (Dr. Carlean Purl) + ? worsened by GI side effect of metformin  . Interstitial cystitis   . Liver function study, abnormal   . Lumbar degenerative disc disease    Facet-mediated pain per Dr. Nelva Bush.  Has gotten repeated LB injections, most recently April 2016 in [place].  . Macular degeneration    left eye  . Menopausal syndrome   . Morton's neuroma   . Obesity, unspecified   . OSA on CPAP 07/2016   HST 07/27/16 >> AHI 6.2, SaO2 low 79%.  CPAP recommended by pulm  . Other specified gastritis without mention of hemorrhage    hx of antral gastritis on EGD 05/2009  . Peripheral neuropathy    per EMG 11/2012.  Intolerant of gabapentin, failed pamelor.  Radiofrequency neurotomy 12/11/14 helped LB pain (Dr.  Ramos)  . Recurrent UTI    on daily antibiotic prophylaxis by her urologist.  . RLS (restless legs syndrome)   . Sleep apnea   . Tarsal tunnel syndrome   . Unspecified venous (peripheral) insufficiency     Past Surgical History:  Procedure Laterality Date  . ANKLE SURGERY     bilateral  . CARDIAC CATHETERIZATION  09/2011   Nonobstructive CAD  . CARDIOVASCULAR STRESS TEST  01/04/2016   Nuclear medicine stress test: NORMAL  . CARDIOVASCULAR STRESS TEST  01/04/2016   Normal stress nuclear study with no ischemia or infarction; EF 82 with normal wall motion  . CATARACT EXTRACTION    . COLONOSCOPY  2002/2005, 11/14/06, and 05/2009   polyps  2002 but none 2005 or 2008 or 2011.  Repeat 08/17/16 for diarrhea: one diminutive adenoma + small polyps removed, diverticulosis noted--no further colonoscopies needed (Dr. Carlean Purl).  . ESOPHAGOGASTRODUODENOSCOPY  05/2009   Moderate antral gastritis  . FOOT SURGERY     R foot bunionectomy and arthroplasty of digits R foot.  Arthroplasty digits 3 and 4 L foot.  Marland Kitchen HAMMER TOE SURGERY     bilateral  . left tarsal tunnel release     sept '11 (Dr Beola Cord)  . radiofrequency neurotomy  06/18/2015   bilat L3-4 medial branch nerve and bilat L5 dorsal ramus nerve.(Dr. Nelva Bush)  . TONSILLECTOMY    . TOTAL ABDOMINAL HYSTERECTOMY W/ BILATERAL SALPINGOOPHORECTOMY    . TRANSTHORACIC ECHOCARDIOGRAM  08/2011; 2017   2013 Grade I DD; 2017 EF 60-65%, normal wall motion, grd II DD.    Outpatient Medications Prior to Visit  Medication Sig Dispense Refill  . albuterol (PROVENTIL HFA;VENTOLIN HFA) 108 (90 Base) MCG/ACT inhaler Inhale 1 puff into the lungs every 6 (six) hours as needed for wheezing or shortness of breath. 6.7 g 0  . ALPRAZolam (XANAX) 0.25 MG tablet Take 0.25 mg by mouth at bedtime as needed for anxiety.    Marland Kitchen aspirin 81 MG tablet Take 81 mg by mouth daily.    Marland Kitchen atorvastatin (LIPITOR) 20 MG tablet TAKE ONE TABLET BY MOUTH DAILY 90 tablet 3  . CALCIUM PO Take 1 tablet by mouth daily.    . cetirizine (ZYRTEC) 10 MG tablet Take 10 mg by mouth as needed for allergies.    . Cyanocobalamin (VITAMIN B-12 PO) Take by mouth.    . DULoxetine (CYMBALTA) 60 MG capsule TAKE ONE CAPSULE BY MOUTH DAILY 90 capsule 3  . estradiol (ESTRACE) 1 MG tablet Take 1 tablet (1 mg total) by mouth daily. 90 tablet 3  . glucose blood test strip Use to check blood sugar once daily 100 each 11  . HYDROcodone-acetaminophen (NORCO/VICODIN) 5-325 MG per tablet Take 1 tablet by mouth every 6 (six) hours as needed for moderate pain.    . Lancets 30G MISC Use to check blood sugar once daily 100 each 11  . levothyroxine (SYNTHROID,  LEVOTHROID) 25 MCG tablet TAKE ONE TABLET BY MOUTH DAILY 90 tablet 0  . loperamide (IMODIUM) 2 MG capsule Take 2 mg by mouth as needed for diarrhea or loose stools. Reported on 05/10/2015    . losartan-hydrochlorothiazide (HYZAAR) 50-12.5 MG tablet Take 1 tablet by mouth daily. 90 tablet 3  . LYRICA 50 MG capsule TAKE ONE CAPSULE BY MOUTH TWICE DAILY 60 capsule 3  . meloxicam (MOBIC) 15 MG tablet TAKE ONE TABLET BY MOUTH DAILY AS NEEDED FOR PAIN 30 tablet 0  . metFORMIN (GLUMETZA) 1000 MG (MOD) 24 hr tablet Take 1  tablet (1,000 mg total) by mouth daily with breakfast. 30 tablet 6  . metoprolol (LOPRESSOR) 50 MG tablet Take 1 tablet (50 mg total) by mouth 2 (two) times daily. 180 tablet 3  . mirtazapine (REMERON) 30 MG tablet TAKE 1/2 TO 1 TABLET BY MOUTH AT BEDTIME FOR pruitis 90 tablet 3  . Multiple Vitamins-Minerals (MULTIVITAMIN ADULT PO) Take by mouth.    Marland Kitchen omeprazole (PRILOSEC) 20 MG capsule TAKE ONE CAPSULE BY MOUTH DAILY 90 capsule 0  . OVER THE COUNTER MEDICATION Eye Vitamin - Areds2    . oxybutynin (DITROPAN-XL) 10 MG 24 hr tablet Take 10 mg by mouth daily.  3  . pioglitazone (ACTOS) 15 MG tablet Take 1 tablet (15 mg total) by mouth daily. 30 tablet 1  . potassium chloride SA (K-DUR,KLOR-CON) 20 MEQ tablet TAKE ONE TABLET BY MOUTH DAILY 90 tablet 3  . blood glucose meter kit and supplies Check blood sugar once every morning. (Patient not taking: Reported on 10/11/2016) 1 each 0  . diphenoxylate-atropine (LOMOTIL) 2.5-0.025 MG tablet Take 2 tablets by mouth every 6 (six) hours as needed for diarrhea or loose stools. (Patient not taking: Reported on 10/11/2016) 30 tablet 1  . 0.9 %  sodium chloride infusion      No facility-administered medications prior to visit.     Allergies  Allergen Reactions  . Gabapentin Itching  . Sulfonamide Derivatives     REACTION: Nausea    ROS As per HPI  PE: Blood pressure 111/62, pulse 61, temperature 97.6 F (36.4 C), temperature source Oral,  resp. rate 16, height 5' 2" (1.575 m), weight 180 lb 12 oz (82 kg), SpO2 97 %. Gen: Alert, well appearing.  Patient is oriented to person, place, time, and situation. AFFECT: pleasant, lucid thought and speech. CV: RRR, no m/r/g.   LUNGS: CTA bilat, nonlabored resps, good aeration in all lung fields. EXT: no clubbing or cyanosis.  Trace bilat LL edema.  LABS:  Lab Results  Component Value Date   TSH 1.74 07/10/2016   Lab Results  Component Value Date   WBC 7.9 07/26/2016   HGB 11.6 (L) 07/26/2016   HCT 34.0 (L) 07/26/2016   MCV 93.9 07/26/2016   PLT 361 07/26/2016   Lab Results  Component Value Date   IRON 70 07/10/2016   FERRITIN 14.5 07/10/2016    Lab Results  Component Value Date   CREATININE 0.60 07/26/2016   BUN 16 07/26/2016   NA 139 07/26/2016   K 4.0 07/26/2016   CL 99 (L) 07/26/2016   CO2 28 07/10/2016   Lab Results  Component Value Date   ALT 22 03/09/2016   AST 16 03/09/2016   ALKPHOS 81 03/09/2016   BILITOT 0.4 03/09/2016   Lab Results  Component Value Date   CHOL 147 12/27/2015   Lab Results  Component Value Date   HDL 45 (L) 12/27/2015   Lab Results  Component Value Date   LDLCALC 46 12/27/2015   Lab Results  Component Value Date   TRIG 282 (H) 12/27/2015   Lab Results  Component Value Date   CHOLHDL 3.3 12/27/2015   Lab Results  Component Value Date   HGBA1C 6.7 (H) 07/10/2016   IMPRESSION AND PLAN:  1) DM 2: well controlled historically. HbA1c and urine microalbumin/cr today. The current medical regimen is effective;  continue present plan and medications.  2) HTN; The current medical regimen is effective;  continue present plan and medications. Lytes/cr today.  3) IBS: improved on  daily imodium (once qd) and change of metformin to the extended release formulation.  4) Normocytic anemia; decreased iron stores.  Hemoccults neg, colonoscopy unrevealing. Likely resulting from phlebotomy and possibly poor iron absorption. Will  start otc FeSO4 329m, 1 qd.  Recheck CBC and iron labs at next f/u 3 mo.  5) DJD, knee--right.  For unicompartmental knee arthroplasty soon.  She is cleared for surgery, needs to stop ASA and meloxicam 5 days prior to surgery.  6) preventative health care: shingrix vaccine rx given to pt to take to pharmacy.  An After Visit Summary was printed and given to the patient.  FOLLOW UP: Return in about 3 months (around 01/11/2017) for routine chronic illness f/u.  Signed:  PCrissie Sickles MD           10/11/2016

## 2016-10-11 NOTE — Telephone Encounter (Signed)
[facility].  RF request for Lyrica LOV: 07/10/16 Next ov: today Last written: 04/17/16 #60 w/ 3RF  Please advise. Thanks.

## 2016-10-11 NOTE — Telephone Encounter (Signed)
Rx faxed

## 2016-10-12 ENCOUNTER — Encounter: Payer: Self-pay | Admitting: *Deleted

## 2016-10-16 ENCOUNTER — Ambulatory Visit: Payer: Self-pay | Admitting: Adult Health

## 2016-10-19 ENCOUNTER — Encounter: Payer: Self-pay | Admitting: *Deleted

## 2016-10-19 ENCOUNTER — Telehealth: Payer: Self-pay | Admitting: Internal Medicine

## 2016-10-19 NOTE — Progress Notes (Signed)
Signed surgical clearance form given to HIM to scan/fax to [facility].

## 2016-10-19 NOTE — Telephone Encounter (Signed)
Surgical Clearance faxed. Scanned in [place].

## 2016-10-25 HISTORY — PX: OTHER SURGICAL HISTORY: SHX169

## 2016-10-26 DIAGNOSIS — H43812 Vitreous degeneration, left eye: Secondary | ICD-10-CM | POA: Diagnosis not present

## 2016-10-26 DIAGNOSIS — H353113 Nonexudative age-related macular degeneration, right eye, advanced atrophic without subfoveal involvement: Secondary | ICD-10-CM | POA: Diagnosis not present

## 2016-10-26 DIAGNOSIS — H35373 Puckering of macula, bilateral: Secondary | ICD-10-CM | POA: Diagnosis not present

## 2016-10-26 DIAGNOSIS — E119 Type 2 diabetes mellitus without complications: Secondary | ICD-10-CM | POA: Diagnosis not present

## 2016-10-26 DIAGNOSIS — H35371 Puckering of macula, right eye: Secondary | ICD-10-CM | POA: Diagnosis not present

## 2016-10-26 DIAGNOSIS — H353221 Exudative age-related macular degeneration, left eye, with active choroidal neovascularization: Secondary | ICD-10-CM | POA: Diagnosis not present

## 2016-10-27 NOTE — Patient Instructions (Addendum)
Katherine Best  10/27/2016   Your procedure is scheduled on: 11/06/16  Report to [facility] Main  Entrance   Report to admitting at    [place] AM   Call this number if you have problems the morning of surgery   [phone]   Remember: ONLY 1 PERSON MAY GO WITH YOU TO SHORT STAY TO GET  READY MORNING OF YOUR SURGERY.  Do not eat food or drink liquids :After Midnight.     Take these medicines the morning of surgery with A SIP OF WATER: omeprazole, metoprolol, Lyrica, levothyroxine,inhaler if needed and bring  DO NOT TAKE ANY DIABETIC MEDICATIONS DAY OF YOUR SURGERY                               You may not have any metal on your body including hair pins and              piercings  Do not wear jewelry, make-up, lotions, powders or perfumes, deodorant             Do not wear nail polish.  Do not shave  48 hours prior to surgery.                Do not bring valuables to the hospital. [facility].  Contacts, dentures or bridgework may not be worn into surgery.  Leave suitcase in the car. After surgery it may be brought to your room.                  Please read over the following fact sheets you were given: _____________________________________________________________________            [facility] - Preparing for Surgery Before surgery, you can play an important role.  Because skin is not sterile, your skin needs to be as free of germs as possible.  You can reduce the number of germs on your skin by washing with CHG (chlorahexidine gluconate) soap before surgery.  CHG is an antiseptic cleaner which kills germs and bonds with the skin to continue killing germs even after washing. Please DO NOT use if you have an allergy to CHG or antibacterial soaps.  If your skin becomes reddened/irritated stop using the CHG and inform your nurse when you arrive at Short Stay. Do not shave (including legs and underarms) for at least 48  hours prior to the first CHG shower.  You may shave your face/neck. Please follow these instructions carefully:  1.  Shower with CHG Soap the night before surgery and the  morning of Surgery.  2.  If you choose to wash your hair, wash your hair first as usual with your  normal  shampoo.  3.  After you shampoo, rinse your hair and body thoroughly to remove the  shampoo.                           4.  Use CHG as you would any other liquid soap.  You can apply chg directly  to the skin and wash                       Gently with a scrungie  or clean washcloth.  5.  Apply the CHG Soap to your body ONLY FROM THE NECK DOWN.   Do not use on face/ open                           Wound or open sores. Avoid contact with eyes, ears mouth and genitals (private parts).                       Wash face,  Genitals (private parts) with your normal soap.             6.  Wash thoroughly, paying special attention to the area where your surgery  will be performed.  7.  Thoroughly rinse your body with warm water from the neck down.  8.  DO NOT shower/wash with your normal soap after using and rinsing off  the CHG Soap.                9.  Pat yourself dry with a clean towel.            10.  Wear clean pajamas.            11.  Place clean sheets on your bed the night of your first shower and do not  sleep with pets. Day of Surgery : Do not apply any lotions/deodorants the morning of surgery.  Please wear clean clothes to the hospital/surgery center.  FAILURE TO FOLLOW THESE INSTRUCTIONS MAY RESULT IN THE CANCELLATION OF YOUR SURGERY PATIENT SIGNATURE_________________________________  NURSE SIGNATURE__________________________________  ________________________________________________________________________  WHAT IS A BLOOD TRANSFUSION? Blood Transfusion Information  A transfusion is the replacement of blood or some of its parts. Blood is made up of multiple cells which provide different functions.  Red blood cells  carry oxygen and are used for blood loss replacement.  White blood cells fight against infection.  Platelets control bleeding.  Plasma helps clot blood.  Other blood products are available for specialized needs, such as hemophilia or other clotting disorders. BEFORE THE TRANSFUSION  Who gives blood for transfusions?   Healthy volunteers who are fully evaluated to make sure their blood is safe. This is blood bank blood. Transfusion therapy is the safest it has ever been in the practice of medicine. Before blood is taken from a donor, a complete history is taken to make sure that person has no history of diseases nor engages in risky social behavior (examples are intravenous drug use or sexual activity with multiple partners). The donor's travel history is screened to minimize risk of transmitting infections, such as malaria. The donated blood is tested for signs of infectious diseases, such as HIV and hepatitis. The blood is then tested to be sure it is compatible with you in order to minimize the chance of a transfusion reaction. If you or a relative donates blood, this is often done in anticipation of surgery and is not appropriate for emergency situations. It takes many days to process the donated blood. RISKS AND COMPLICATIONS Although transfusion therapy is very safe and saves many lives, the main dangers of transfusion include:   Getting an infectious disease.  Developing a transfusion reaction. This is an allergic reaction to something in the blood you were given. Every precaution is taken to prevent this. The decision to have a blood transfusion has been considered carefully by your caregiver before blood is given. Blood is not given unless the benefits outweigh the risks. AFTER THE  TRANSFUSION  Right after receiving a blood transfusion, you will usually feel much better and more energetic. This is especially true if your red blood cells have gotten low (anemic). The transfusion raises  the level of the red blood cells which carry oxygen, and this usually causes an energy increase.  The nurse administering the transfusion will monitor you carefully for complications. HOME CARE INSTRUCTIONS  No special instructions are needed after a transfusion. You may find your energy is better. Speak with your caregiver about any limitations on activity for underlying diseases you may have. SEEK MEDICAL CARE IF:   Your condition is not improving after your transfusion.  You develop redness or irritation at the intravenous (IV) site. SEEK IMMEDIATE MEDICAL CARE IF:  Any of the following symptoms occur over the next 12 hours:  Shaking chills.  You have a temperature by mouth above 102 F (38.9 C), not controlled by medicine.  Chest, back, or muscle pain.  People around you feel you are not acting correctly or are confused.  Shortness of breath or difficulty breathing.  Dizziness and fainting.  You get a rash or develop hives.  You have a decrease in urine output.  Your urine turns a dark color or changes to pink, red, or brown. Any of the following symptoms occur over the next 10 days:  You have a temperature by mouth above 102 F (38.9 C), not controlled by medicine.  Shortness of breath.  Weakness after normal activity.  The white part of the eye turns yellow (jaundice).  You have a decrease in the amount of urine or are urinating less often.  Your urine turns a dark color or changes to pink, red, or brown. Document Released: 03/10/2000 Document Revised: 06/05/2011 Document Reviewed: 10/28/2007 ExitCare Patient Information 2014 [place].  _______________________________________________________________________  Incentive Spirometer  An incentive spirometer is a tool that can help keep your lungs clear and active. This tool measures how well you are filling your lungs with each breath. Taking long deep breaths may help reverse or decrease the chance of  developing breathing (pulmonary) problems (especially infection) following:  A long period of time when you are unable to move or be active. BEFORE THE PROCEDURE   If the spirometer includes an indicator to show your best effort, your nurse or respiratory therapist will set it to a desired goal.  If possible, sit up straight or lean slightly forward. Try not to slouch.  Hold the incentive spirometer in an upright position. INSTRUCTIONS FOR USE  1. Sit on the edge of your bed if possible, or sit up as far as you can in bed or on a chair. 2. Hold the incentive spirometer in an upright position. 3. Breathe out normally. 4. Place the mouthpiece in your mouth and seal your lips tightly around it. 5. Breathe in slowly and as deeply as possible, raising the piston or the ball toward the top of the column. 6. Hold your breath for 3-5 seconds or for as long as possible. Allow the piston or ball to fall to the bottom of the column. 7. Remove the mouthpiece from your mouth and breathe out normally. 8. Rest for a few seconds and repeat Steps 1 through 7 at least 10 times every 1-2 hours when you are awake. Take your time and take a few normal breaths between deep breaths. 9. The spirometer may include an indicator to show your best effort. Use the indicator as a goal to work toward during each repetition.  10. After each set of 10 deep breaths, practice coughing to be sure your lungs are clear. If you have an incision (the cut made at the time of surgery), support your incision when coughing by placing a pillow or rolled up towels firmly against it. Once you are able to get out of bed, walk around indoors and cough well. You may stop using the incentive spirometer when instructed by your caregiver.  RISKS AND COMPLICATIONS  Take your time so you do not get dizzy or light-headed.  If you are in pain, you may need to take or ask for pain medication before doing incentive spirometry. It is harder to take a  deep breath if you are having pain. AFTER USE  Rest and breathe slowly and easily.  It can be helpful to keep track of a log of your progress. Your caregiver can provide you with a simple table to help with this. If you are using the spirometer at home, follow these instructions: [place] IF:   You are having difficultly using the spirometer.  You have trouble using the spirometer as often as instructed.  Your pain medication is not giving enough relief while using the spirometer.  You develop fever of 100.5 F (38.1 C) or higher. SEEK IMMEDIATE MEDICAL CARE IF:   You cough up bloody sputum that had not been present before.  You develop fever of 102 F (38.9 C) or greater.  You develop worsening pain at or near the incision site. MAKE SURE YOU:   Understand these instructions.  Will watch your condition.  Will get help right away if you are not doing well or get worse. Document Released: 07/24/2006 Document Revised: 06/05/2011 Document Reviewed: 09/24/2006 [facility] Patient Information 2014 [place], [place].   ________________________________________________________________________

## 2016-10-27 NOTE — Progress Notes (Signed)
Dr. [name] 10/11/16 chart Card ov note 04/06/16 chart Stress 01/04/16 epic Echo 01/04/16 epic [id number] 10/01/16 epic ekg 07/27/16 epic

## 2016-10-28 NOTE — H&P (Signed)
UNICOMPARTMENTAL KNEE ADMISSION H&P  Patient is being admitted for right medial unicompartmental knee arthroplasty.  Subjective:  Chief Complaint:  Right knee medial compartmental primary OA /pain    HPI: Katherine Best, 83 y.o. female , has a history of pain and functional disability in the right and has failed non-surgical conservative treatments for greater than 12 weeks to include NSAID's and/or analgesics, corticosteriod injections, viscosupplementation injections, use of assistive devices and activity modification.  Onset of symptoms was gradual, starting 1+ years ago with gradually worsening course since that time. The patient noted no past surgery on the right knee(s).  Patient currently rates pain in the right knee(s) at 9 out of 10 with activity. Patient has worsening of pain with activity and weight bearing, pain that interferes with activities of daily living, pain with passive range of motion, crepitus and joint swelling.  Patient has evidence of periarticular osteophytes and joint space narrowing of the medial compartment by imaging studies.  There is no active infection.  Risks, benefits and expectations were discussed with the patient.  Risks including but not limited to the risk of anesthesia, blood clots, nerve damage, blood vessel damage, failure of the prosthesis, infection and up to and including death.  Patient understand the risks, benefits and expectations and wishes to proceed with surgery.   PCP: Tammi Sou, MD  D/C Plans:       Home   Post-op Meds:       No Rx given   Tranexamic Acid:      To be given - IV   Decadron:      is to be given  FYI:     ASA  Norco  DME:   Pt already has equipment   PT:   OPPT - Rx given   Past Medical History:  Diagnosis Date  . Abdominal pain, right lower quadrant   . Anxiety   . Bursitis   . Cataracts, both eyes    soon to have cataract surg as of 03/2015  . Depression   . Diabetes mellitus with skin complications  ([place])   . Diverticulosis of colon    noted on colonoscopy 2008  . Dyspnea    a. 09/2011 CTA Chest: No PE, Ca2+ cors;  b. 09/2011 R&L heart Cath: Relatively nl R heart pressures, nl co/ci, nonobs cad, nl EF.  12/2015 stress test normal, echo with grd II DD--likely explains DOE.  Marland Kitchen External hemorrhoid   . Fatigue    w/ ? excessive daytime somnolence; ? OSA--eval by Dr. Halford Chessman 06/2016, home sleep study ordered.  . Former smoker    30 pack-yrs, quit 2005  . Hot flashes    restarted estradiol 01/2015  . Hx of adenomatous polyp of colon 08/25/2016   07/2016 no recall  . Hyperlipidemia   . Hypertension   . Hypothyroidism   . IBS (irritable bowel syndrome)    IBS-D (Dr. Carlean Purl) + ? worsened by GI side effect of metformin  . Interstitial cystitis   . Liver function study, abnormal   . Lumbar degenerative disc disease    Facet-mediated pain per Dr. Nelva Bush.  Has gotten repeated LB injections, most recently April 2016 in [place].  . Macular degeneration    left eye  . Menopausal syndrome   . Morton's neuroma   . Obesity, unspecified   . OSA on CPAP 07/2016   HST 07/27/16 >> AHI 6.2, SaO2 low 79%.  CPAP recommended by pulm  . Other specified gastritis without mention  of hemorrhage    hx of antral gastritis on EGD 05/2009  . Peripheral neuropathy    per EMG 11/2012.  Intolerant of gabapentin, failed pamelor.  Radiofrequency neurotomy 12/11/14 helped LB pain (Dr. Nelva Bush)  . Recurrent UTI    on daily antibiotic prophylaxis by her urologist.  . RLS (restless legs syndrome)   . Sleep apnea   . Tarsal tunnel syndrome   . Unspecified venous (peripheral) insufficiency      Past Surgical History:  Procedure Laterality Date  . ANKLE SURGERY     bilateral  . CARDIAC CATHETERIZATION  09/2011   Nonobstructive CAD  . CARDIOVASCULAR STRESS TEST  01/04/2016   Nuclear medicine stress test: NORMAL  . CARDIOVASCULAR STRESS TEST  01/04/2016   Normal stress nuclear study with no ischemia or infarction; EF 82 with  normal wall motion  . CATARACT EXTRACTION    . COLONOSCOPY  2002/2005, 11/14/06, and 05/2009   polyps 2002 but none 2005 or 2008 or 2011.  Repeat 08/17/16 for diarrhea: one diminutive adenoma + small polyps removed, diverticulosis noted--no further colonoscopies needed (Dr. Carlean Purl).  . ESOPHAGOGASTRODUODENOSCOPY  05/2009   Moderate antral gastritis  . FOOT SURGERY     R foot bunionectomy and arthroplasty of digits R foot.  Arthroplasty digits 3 and 4 L foot.  Marland Kitchen HAMMER TOE SURGERY     bilateral  . left tarsal tunnel release     sept '11 (Dr Beola Cord)  . radiofrequency neurotomy  06/18/2015   bilat L3-4 medial branch nerve and bilat L5 dorsal ramus nerve.(Dr. Nelva Bush)  . TONSILLECTOMY    . TOTAL ABDOMINAL HYSTERECTOMY W/ BILATERAL SALPINGOOPHORECTOMY    . TRANSTHORACIC ECHOCARDIOGRAM  08/2011; 2017   2013 Grade I DD; 2017 EF 60-65%, normal wall motion, grd II DD.    Allergies  Allergen Reactions  . Gabapentin Itching  . Sulfonamide Derivatives     REACTION: Nausea    Social History  Substance Use Topics  . Smoking status: Former Smoker    Packs/day: 1.00    Years: 35.00    Types: Cigarettes    Quit date: 03/27/2002  . Smokeless tobacco: Former Systems developer    Quit date: 07/01/2005  . Alcohol use No    Family History  Problem Relation Age of Onset  . Cancer Mother        Breast  . Breast cancer Mother   . Hypertension Father   . Hyperlipidemia Father   . Diabetes Maternal Grandfather   . Colon cancer Neg Hx      Review of Systems  Constitutional: Negative.   HENT: Negative.   Eyes: Negative.   Respiratory: Negative.   Cardiovascular: Negative.   Gastrointestinal: Positive for heartburn.  Genitourinary: Negative.   Musculoskeletal: Positive for back pain and joint pain.  Skin: Negative.   Neurological: Negative.   Endo/Heme/Allergies: Negative.   Psychiatric/Behavioral: Positive for depression. The patient is nervous/anxious.      Objective:   Physical Exam   Constitutional: She is well-developed, well-nourished, and in no distress.  Eyes: Pupils are equal, round, and reactive to light.  Neck: Neck supple. No JVD present. No tracheal deviation present. No thyromegaly present.  Cardiovascular: Normal rate, regular rhythm and intact distal pulses.   Murmur heard. Pulmonary/Chest: Effort normal and breath sounds normal. No respiratory distress. She has no wheezes.  Abdominal: Soft. There is no tenderness. There is no guarding.  Musculoskeletal:       Right knee: She exhibits decreased range of motion, swelling and bony  tenderness. She exhibits no ecchymosis, no deformity, no laceration and no erythema. Tenderness found. Medial joint line tenderness noted. No lateral joint line tenderness noted.  Lymphadenopathy:    She has no cervical adenopathy.  Neurological: She is alert. A sensory deficit (neuropathy in bilateral feet) is present.  Skin: Skin is warm and dry.       Labs:  Estimated body mass index is 33.06 kg/m as calculated from the following:   Height as of 10/11/16: 5\' 2"  (1.575 m).   Weight as of 10/11/16: 82 kg (180 lb 12 oz).   Imaging Review Plain radiographs demonstrate severe degenerative joint disease of the right knee(s) medial compartment.  The bone quality appears to be good for age and reported activity level.  Assessment/Plan:  End stage arthritis, right knee medial compartment  The patient history, physical examination, clinical judgment of the provider and imaging studies are consistent with end stage degenerative joint disease of the right knee(s) and medial unicompartmental knee arthroplasty is deemed medically necessary. The treatment options including medical management, injection therapy arthroscopy and arthroplasty were discussed at length. The risks and benefits of total knee arthroplasty were presented and reviewed. The risks due to aseptic loosening, infection, stiffness, patella tracking problems,  thromboembolic complications and other imponderables were discussed. The patient acknowledged the explanation, agreed to proceed with the plan and consent was signed. Patient is being admitted for outpatient / observation treatment for surgery, pain control, PT, OT, prophylactic antibiotics, VTE prophylaxis, progressive ambulation and ADL's and discharge planning. The patient is planning to be discharged home.    West Pugh [place] Lozito   PA-C  10/28/2016, 9:10 PM

## 2016-10-29 ENCOUNTER — Encounter: Payer: Self-pay | Admitting: Family Medicine

## 2016-10-30 ENCOUNTER — Encounter: Payer: Self-pay | Admitting: *Deleted

## 2016-10-30 ENCOUNTER — Ambulatory Visit
Admission: RE | Admit: 2016-10-30 | Discharge: 2016-10-30 | Disposition: A | Discharge status: Home / Self Care | Payer: Medicare Other | Source: Ambulatory Visit | Attending: Family Medicine | Admitting: Family Medicine

## 2016-10-30 ENCOUNTER — Encounter: Payer: Self-pay | Admitting: Family Medicine

## 2016-10-30 ENCOUNTER — Encounter (HOSPITAL_COMMUNITY)
Admission: RE | Admit: 2016-10-30 | Discharge: 2016-10-30 | Disposition: A | Discharge status: Home / Self Care | Payer: Medicare Other | Source: Ambulatory Visit | Attending: Orthopedic Surgery | Admitting: Orthopedic Surgery

## 2016-10-30 ENCOUNTER — Other Ambulatory Visit: Payer: Self-pay | Admitting: Family Medicine

## 2016-10-30 DIAGNOSIS — M1711 Unilateral primary osteoarthritis, right knee: Secondary | ICD-10-CM | POA: Diagnosis not present

## 2016-10-30 DIAGNOSIS — E2839 Other primary ovarian failure: Secondary | ICD-10-CM

## 2016-10-30 DIAGNOSIS — Z01812 Encounter for preprocedural laboratory examination: Secondary | ICD-10-CM | POA: Diagnosis not present

## 2016-10-30 DIAGNOSIS — Z780 Asymptomatic menopausal state: Secondary | ICD-10-CM | POA: Diagnosis not present

## 2016-10-30 DIAGNOSIS — Z13820 Encounter for screening for osteoporosis: Secondary | ICD-10-CM | POA: Diagnosis not present

## 2016-10-30 DIAGNOSIS — Z1231 Encounter for screening mammogram for malignant neoplasm of breast: Secondary | ICD-10-CM | POA: Diagnosis not present

## 2016-10-30 HISTORY — DX: Pneumonia, unspecified organism: J18.9

## 2016-10-30 HISTORY — DX: Cardiac murmur, unspecified: R01.1

## 2016-10-30 LAB — CBC
HCT: 35 % — ABNORMAL LOW (ref 36.0–46.0)
Hemoglobin: 11.1 g/dL — ABNORMAL LOW (ref 12.0–15.0)
MCH: 30.2 pg (ref 26.0–34.0)
MCHC: 31.7 g/dL (ref 30.0–36.0)
MCV: 95.4 fL (ref 78.0–100.0)
Platelets: 304 10*3/uL (ref 150–400)
RBC: 3.67 MIL/uL — AB (ref 3.87–5.11)
RDW: 13.7 % (ref 11.5–15.5)
WBC: 7.5 10*3/uL (ref 4.0–10.5)

## 2016-10-30 LAB — BASIC METABOLIC PANEL
Anion gap: 11 (ref 5–15)
BUN: 17 mg/dL (ref 6–20)
CO2: 28 mmol/L (ref 22–32)
CREATININE: 0.76 mg/dL (ref 0.44–1.00)
Calcium: 9.2 mg/dL (ref 8.9–10.3)
Chloride: 101 mmol/L (ref 101–111)
Glucose, Bld: 123 mg/dL — ABNORMAL HIGH (ref 65–99)
Potassium: 4.2 mmol/L (ref 3.5–5.1)
SODIUM: 140 mmol/L (ref 135–145)

## 2016-10-30 LAB — GLUCOSE, CAPILLARY: Glucose-Capillary: 133 mg/dL — ABNORMAL HIGH (ref 65–99)

## 2016-10-30 LAB — SURGICAL PCR SCREEN
MRSA, PCR: NEGATIVE
Staphylococcus aureus: NEGATIVE

## 2016-10-30 LAB — ABO/RH: ABO/RH(D): B POS

## 2016-10-30 NOTE — Telephone Encounter (Signed)
[facility]

## 2016-10-30 NOTE — Progress Notes (Signed)
FYI pt. BP at preop was 110/42 Left arm and 116/41 right arm. Pt. Was asymptomatic. Thank you!

## 2016-11-06 ENCOUNTER — Observation Stay (HOSPITAL_COMMUNITY)
Admission: RE | Admit: 2016-11-06 | Discharge: 2016-11-07 | Disposition: A | Discharge status: Home / Self Care | Payer: Medicare Other | Source: Ambulatory Visit | Attending: Orthopedic Surgery | Admitting: Orthopedic Surgery

## 2016-11-06 ENCOUNTER — Encounter (HOSPITAL_COMMUNITY)
Admission: RE | Disposition: A | Discharge status: Home / Self Care | Payer: Self-pay | Source: Ambulatory Visit | Attending: Orthopedic Surgery

## 2016-11-06 ENCOUNTER — Encounter (HOSPITAL_COMMUNITY): Payer: Self-pay

## 2016-11-06 ENCOUNTER — Ambulatory Visit (HOSPITAL_COMMUNITY): Payer: Medicare Other | Admitting: Certified Registered Nurse Anesthetist

## 2016-11-06 DIAGNOSIS — M1711 Unilateral primary osteoarthritis, right knee: Secondary | ICD-10-CM | POA: Diagnosis not present

## 2016-11-06 DIAGNOSIS — K589 Irritable bowel syndrome without diarrhea: Secondary | ICD-10-CM | POA: Insufficient documentation

## 2016-11-06 DIAGNOSIS — E785 Hyperlipidemia, unspecified: Secondary | ICD-10-CM | POA: Insufficient documentation

## 2016-11-06 DIAGNOSIS — E11628 Type 2 diabetes mellitus with other skin complications: Secondary | ICD-10-CM | POA: Insufficient documentation

## 2016-11-06 DIAGNOSIS — E039 Hypothyroidism, unspecified: Secondary | ICD-10-CM | POA: Diagnosis not present

## 2016-11-06 DIAGNOSIS — Z79899 Other long term (current) drug therapy: Secondary | ICD-10-CM | POA: Diagnosis not present

## 2016-11-06 DIAGNOSIS — I10 Essential (primary) hypertension: Secondary | ICD-10-CM | POA: Diagnosis not present

## 2016-11-06 DIAGNOSIS — Z794 Long term (current) use of insulin: Secondary | ICD-10-CM | POA: Diagnosis not present

## 2016-11-06 DIAGNOSIS — Z780 Asymptomatic menopausal state: Secondary | ICD-10-CM | POA: Diagnosis not present

## 2016-11-06 DIAGNOSIS — Z79891 Long term (current) use of opiate analgesic: Secondary | ICD-10-CM | POA: Insufficient documentation

## 2016-11-06 DIAGNOSIS — Z882 Allergy status to sulfonamides status: Secondary | ICD-10-CM | POA: Diagnosis not present

## 2016-11-06 DIAGNOSIS — Z7982 Long term (current) use of aspirin: Secondary | ICD-10-CM | POA: Insufficient documentation

## 2016-11-06 DIAGNOSIS — Z87891 Personal history of nicotine dependence: Secondary | ICD-10-CM | POA: Insufficient documentation

## 2016-11-06 DIAGNOSIS — E669 Obesity, unspecified: Secondary | ICD-10-CM | POA: Diagnosis not present

## 2016-11-06 DIAGNOSIS — G8918 Other acute postprocedural pain: Secondary | ICD-10-CM | POA: Diagnosis not present

## 2016-11-06 DIAGNOSIS — Z791 Long term (current) use of non-steroidal anti-inflammatories (NSAID): Secondary | ICD-10-CM | POA: Insufficient documentation

## 2016-11-06 DIAGNOSIS — G4733 Obstructive sleep apnea (adult) (pediatric): Secondary | ICD-10-CM | POA: Insufficient documentation

## 2016-11-06 DIAGNOSIS — G2581 Restless legs syndrome: Secondary | ICD-10-CM | POA: Diagnosis not present

## 2016-11-06 DIAGNOSIS — E1142 Type 2 diabetes mellitus with diabetic polyneuropathy: Secondary | ICD-10-CM | POA: Diagnosis not present

## 2016-11-06 DIAGNOSIS — Z6832 Body mass index (BMI) 32.0-32.9, adult: Secondary | ICD-10-CM | POA: Diagnosis not present

## 2016-11-06 DIAGNOSIS — Z96651 Presence of right artificial knee joint: Secondary | ICD-10-CM

## 2016-11-06 HISTORY — PX: PARTIAL KNEE ARTHROPLASTY: SHX2174

## 2016-11-06 LAB — GLUCOSE, CAPILLARY
GLUCOSE-CAPILLARY: 119 mg/dL — AB (ref 65–99)
GLUCOSE-CAPILLARY: 142 mg/dL — AB (ref 65–99)
Glucose-Capillary: 100 mg/dL — ABNORMAL HIGH (ref 65–99)
Glucose-Capillary: 138 mg/dL — ABNORMAL HIGH (ref 65–99)

## 2016-11-06 LAB — TYPE AND SCREEN
ABO/RH(D): B POS
ANTIBODY SCREEN: NEGATIVE

## 2016-11-06 SURGERY — ARTHROPLASTY, KNEE, UNICOMPARTMENTAL
Anesthesia: Monitor Anesthesia Care | Site: Knee | Laterality: Right

## 2016-11-06 MED ORDER — PROMETHAZINE HCL 25 MG/ML IJ SOLN: 6.2500 mg | INTRAMUSCULAR | Status: DC | PRN

## 2016-11-06 MED ORDER — CEFAZOLIN SODIUM-DEXTROSE 1-4 GM/50ML-% IV SOLN
1.0000 g | Freq: Four times a day (QID) | INTRAVENOUS | Status: AC
  Administered 2016-11-06 (×2): 1 g via INTRAVENOUS
  Filled 2016-11-06 (×5): qty 50

## 2016-11-06 MED ORDER — CELECOXIB 200 MG PO CAPS
200.0000 mg | ORAL_CAPSULE | Freq: Two times a day (BID) | ORAL | Status: DC
  Administered 2016-11-06 – 2016-11-07 (×3): 200 mg via ORAL
  Filled 2016-11-06 (×3): qty 1

## 2016-11-06 MED ORDER — MIDAZOLAM HCL 2 MG/2ML IJ SOLN
INTRAMUSCULAR | Status: AC
  Filled 2016-11-06: qty 2

## 2016-11-06 MED ORDER — KETOROLAC TROMETHAMINE 30 MG/ML IJ SOLN
INTRAMUSCULAR | Status: AC
  Filled 2016-11-06: qty 1

## 2016-11-06 MED ORDER — METFORMIN HCL ER 500 MG PO TB24
1000.0000 mg | ORAL_TABLET | Freq: Every day | ORAL | Status: DC
  Administered 2016-11-07: 1000 mg via ORAL
  Filled 2016-11-06: qty 2

## 2016-11-06 MED ORDER — LEVOTHYROXINE SODIUM 25 MCG PO TABS
25.0000 ug | ORAL_TABLET | Freq: Every day | ORAL | Status: DC
  Administered 2016-11-07: 10:00:00 25 ug via ORAL
  Filled 2016-11-06: qty 1

## 2016-11-06 MED ORDER — PREGABALIN 50 MG PO CAPS
50.0000 mg | ORAL_CAPSULE | Freq: Two times a day (BID) | ORAL | Status: DC
  Administered 2016-11-06 – 2016-11-07 (×2): 50 mg via ORAL
  Filled 2016-11-06 (×2): qty 1

## 2016-11-06 MED ORDER — LOSARTAN POTASSIUM-HCTZ 50-12.5 MG PO TABS: 1.0000 | ORAL_TABLET | Freq: Every day | ORAL | Status: DC

## 2016-11-06 MED ORDER — LIDOCAINE 2% (20 MG/ML) 5 ML SYRINGE
INTRAMUSCULAR | Status: AC
  Filled 2016-11-06: qty 5

## 2016-11-06 MED ORDER — FENTANYL CITRATE (PF) 100 MCG/2ML IJ SOLN
INTRAMUSCULAR | Status: AC
  Filled 2016-11-06: qty 2

## 2016-11-06 MED ORDER — ATORVASTATIN CALCIUM 20 MG PO TABS
20.0000 mg | ORAL_TABLET | Freq: Every evening | ORAL | Status: DC
  Administered 2016-11-06: 18:00:00 20 mg via ORAL
  Filled 2016-11-06: qty 1

## 2016-11-06 MED ORDER — FERROUS SULFATE 325 (65 FE) MG PO TABS
325.0000 mg | ORAL_TABLET | Freq: Two times a day (BID) | ORAL | Status: DC
  Administered 2016-11-06 – 2016-11-07 (×2): 325 mg via ORAL
  Filled 2016-11-06 (×2): qty 1

## 2016-11-06 MED ORDER — HYDROMORPHONE HCL-NACL 0.5-0.9 MG/ML-% IV SOSY: 0.2500 mg | PREFILLED_SYRINGE | INTRAVENOUS | Status: DC | PRN

## 2016-11-06 MED ORDER — ACETAMINOPHEN 650 MG RE SUPP: 650.0000 mg | Freq: Four times a day (QID) | RECTAL | Status: DC | PRN

## 2016-11-06 MED ORDER — ALPRAZOLAM 0.25 MG PO TABS: 0.2500 mg | ORAL_TABLET | Freq: Every evening | ORAL | Status: DC | PRN

## 2016-11-06 MED ORDER — LACTATED RINGERS IV SOLN
INTRAVENOUS | Status: DC
  Administered 2016-11-06: 1000 mL via INTRAVENOUS

## 2016-11-06 MED ORDER — DULOXETINE HCL 60 MG PO CPEP
60.0000 mg | ORAL_CAPSULE | Freq: Every day | ORAL | Status: DC
  Administered 2016-11-06 – 2016-11-07 (×2): 60 mg via ORAL
  Filled 2016-11-06 (×2): qty 1

## 2016-11-06 MED ORDER — SODIUM CHLORIDE 0.9 % IJ SOLN
INTRAMUSCULAR | Status: DC | PRN
  Administered 2016-11-06: 19 mL

## 2016-11-06 MED ORDER — PHENOL 1.4 % MT LIQD: 1.0000 | OROMUCOSAL | Status: DC | PRN

## 2016-11-06 MED ORDER — SODIUM CHLORIDE 0.9 % IJ SOLN
INTRAMUSCULAR | Status: AC
  Filled 2016-11-06: qty 50

## 2016-11-06 MED ORDER — HYDROCHLOROTHIAZIDE 12.5 MG PO CAPS
12.5000 mg | ORAL_CAPSULE | Freq: Every day | ORAL | Status: DC
  Administered 2016-11-06: 15:00:00 12.5 mg via ORAL
  Filled 2016-11-06: qty 1

## 2016-11-06 MED ORDER — HYDROCODONE-ACETAMINOPHEN 7.5-325 MG PO TABS
1.0000 | ORAL_TABLET | Freq: Four times a day (QID) | ORAL | Status: DC
  Administered 2016-11-06 – 2016-11-07 (×3): 2 via ORAL
  Administered 2016-11-07: 02:00:00 1 via ORAL
  Administered 2016-11-07: 2 via ORAL
  Filled 2016-11-06: qty 1
  Filled 2016-11-06 (×4): qty 2

## 2016-11-06 MED ORDER — PROPOFOL 10 MG/ML IV BOLUS
INTRAVENOUS | Status: AC
  Filled 2016-11-06: qty 60

## 2016-11-06 MED ORDER — ACETAMINOPHEN 325 MG PO TABS: 650.0000 mg | ORAL_TABLET | Freq: Four times a day (QID) | ORAL | Status: DC | PRN

## 2016-11-06 MED ORDER — PROPOFOL 500 MG/50ML IV EMUL
INTRAVENOUS | Status: DC | PRN
  Administered 2016-11-06: 80 ug/kg/min via INTRAVENOUS

## 2016-11-06 MED ORDER — SODIUM CHLORIDE 0.9 % IJ SOLN
INTRAMUSCULAR | Status: AC
  Filled 2016-11-06: qty 10

## 2016-11-06 MED ORDER — BUPIVACAINE-EPINEPHRINE 0.25% -1:200000 IJ SOLN
INTRAMUSCULAR | Status: DC | PRN
  Administered 2016-11-06: 20 mL

## 2016-11-06 MED ORDER — ALBUTEROL SULFATE (2.5 MG/3ML) 0.083% IN NEBU
3.0000 mL | INHALATION_SOLUTION | Freq: Four times a day (QID) | RESPIRATORY_TRACT | Status: DC | PRN
Start: 2016-11-06 — End: 2016-11-07

## 2016-11-06 MED ORDER — ADULT MULTIVITAMIN W/MINERALS CH
ORAL_TABLET | Freq: Every day | ORAL | Status: DC
  Administered 2016-11-06 – 2016-11-07 (×2): 1 via ORAL
  Filled 2016-11-06 (×2): qty 1

## 2016-11-06 MED ORDER — DOCUSATE SODIUM 100 MG PO CAPS
100.0000 mg | ORAL_CAPSULE | Freq: Two times a day (BID) | ORAL | Status: DC
  Administered 2016-11-06 – 2016-11-07 (×3): 100 mg via ORAL
  Filled 2016-11-06 (×3): qty 1

## 2016-11-06 MED ORDER — OXYCODONE HCL 5 MG/5ML PO SOLN
5.0000 mg | Freq: Once | ORAL | Status: DC | PRN
  Filled 2016-11-06: qty 5

## 2016-11-06 MED ORDER — TRANEXAMIC ACID 1000 MG/10ML IV SOLN
1000.0000 mg | INTRAVENOUS | Status: AC
  Administered 2016-11-06: 1000 mg via INTRAVENOUS
  Filled 2016-11-06: qty 1100

## 2016-11-06 MED ORDER — LOSARTAN POTASSIUM 50 MG PO TABS
50.0000 mg | ORAL_TABLET | Freq: Every day | ORAL | Status: DC
  Administered 2016-11-06: 15:00:00 50 mg via ORAL
  Filled 2016-11-06: qty 1

## 2016-11-06 MED ORDER — POTASSIUM CHLORIDE CRYS ER 20 MEQ PO TBCR
20.0000 meq | EXTENDED_RELEASE_TABLET | Freq: Every day | ORAL | Status: DC
  Administered 2016-11-06: 18:00:00 20 meq via ORAL
  Filled 2016-11-06: qty 1

## 2016-11-06 MED ORDER — OXYCODONE HCL 5 MG PO TABS: 5.0000 mg | ORAL_TABLET | Freq: Once | ORAL | Status: DC | PRN

## 2016-11-06 MED ORDER — PROPOFOL 500 MG/50ML IV EMUL
INTRAVENOUS | Status: DC | PRN
  Administered 2016-11-06: 60 mg via INTRAVENOUS

## 2016-11-06 MED ORDER — PIOGLITAZONE HCL 15 MG PO TABS
15.0000 mg | ORAL_TABLET | Freq: Every day | ORAL | Status: DC
  Administered 2016-11-06 – 2016-11-07 (×2): 15 mg via ORAL
  Filled 2016-11-06 (×2): qty 1

## 2016-11-06 MED ORDER — KETOROLAC TROMETHAMINE 30 MG/ML IJ SOLN
INTRAMUSCULAR | Status: DC | PRN
  Administered 2016-11-06: 30 mg via INTRAVENOUS

## 2016-11-06 MED ORDER — MIRTAZAPINE 15 MG PO TABS
7.5000 mg | ORAL_TABLET | Freq: Every day | ORAL | Status: DC
  Administered 2016-11-06: 7.5 mg via ORAL
  Filled 2016-11-06: qty 1

## 2016-11-06 MED ORDER — METOCLOPRAMIDE HCL 5 MG/ML IJ SOLN: 5.0000 mg | Freq: Three times a day (TID) | INTRAMUSCULAR | Status: DC | PRN

## 2016-11-06 MED ORDER — METFORMIN HCL ER 500 MG PO TB24: 500.0000 mg | ORAL_TABLET | Freq: Every day | ORAL | Status: DC

## 2016-11-06 MED ORDER — CEFAZOLIN SODIUM-DEXTROSE 2-4 GM/100ML-% IV SOLN
2.0000 g | INTRAVENOUS | Status: AC
Start: 2016-11-06 — End: 2016-11-06
  Administered 2016-11-06: 2 g via INTRAVENOUS

## 2016-11-06 MED ORDER — DEXAMETHASONE SODIUM PHOSPHATE 10 MG/ML IJ SOLN
10.0000 mg | Freq: Once | INTRAMUSCULAR | Status: AC
  Administered 2016-11-07: 10 mg via INTRAVENOUS
  Filled 2016-11-06: qty 1

## 2016-11-06 MED ORDER — DEXAMETHASONE SODIUM PHOSPHATE 10 MG/ML IJ SOLN: 10.0000 mg | Freq: Once | INTRAMUSCULAR | Status: DC

## 2016-11-06 MED ORDER — CHLORHEXIDINE GLUCONATE 4 % EX LIQD: 60.0000 mL | Freq: Once | CUTANEOUS | Status: DC

## 2016-11-06 MED ORDER — BUPIVACAINE-EPINEPHRINE (PF) 0.25% -1:200000 IJ SOLN
INTRAMUSCULAR | Status: AC
  Filled 2016-11-06: qty 30

## 2016-11-06 MED ORDER — CALCIUM CARBONATE 1500 (600 CA) MG PO TABS
1500.0000 mg | ORAL_TABLET | Freq: Every day | ORAL | Status: DC
  Administered 2016-11-06 – 2016-11-07 (×2): 1500 mg via ORAL
  Filled 2016-11-06 (×2): qty 1

## 2016-11-06 MED ORDER — LACTATED RINGERS IV SOLN
INTRAVENOUS | Status: DC | PRN
  Administered 2016-11-06 (×3): via INTRAVENOUS

## 2016-11-06 MED ORDER — CEFAZOLIN SODIUM-DEXTROSE 2-4 GM/100ML-% IV SOLN
INTRAVENOUS | Status: AC
  Filled 2016-11-06: qty 100

## 2016-11-06 MED ORDER — SODIUM CHLORIDE 0.9 % IV SOLN
INTRAVENOUS | Status: DC
  Administered 2016-11-06: 19:00:00 via INTRAVENOUS

## 2016-11-06 MED ORDER — 0.9 % SODIUM CHLORIDE (POUR BTL) OPTIME
TOPICAL | Status: DC | PRN
  Administered 2016-11-06: 1000 mL

## 2016-11-06 MED ORDER — MIDAZOLAM HCL 5 MG/ML IJ SOLN
1.0000 mg | INTRAMUSCULAR | Status: DC | PRN
  Administered 2016-11-06: 1 mg via INTRAVENOUS

## 2016-11-06 MED ORDER — LORATADINE 10 MG PO TABS: 10.0000 mg | ORAL_TABLET | Freq: Every day | ORAL | Status: DC | PRN

## 2016-11-06 MED ORDER — PHENYLEPHRINE HCL 10 MG/ML IJ SOLN
INTRAMUSCULAR | Status: DC | PRN
  Administered 2016-11-06 (×2): 40 ug via INTRAVENOUS
  Administered 2016-11-06 (×3): 80 ug via INTRAVENOUS

## 2016-11-06 MED ORDER — PROSIGHT PO TABS
ORAL_TABLET | Freq: Two times a day (BID) | ORAL | Status: DC
  Administered 2016-11-06 – 2016-11-07 (×2): 1 via ORAL
  Filled 2016-11-06 (×2): qty 1

## 2016-11-06 MED ORDER — FENTANYL CITRATE (PF) 100 MCG/2ML IJ SOLN
50.0000 ug | INTRAMUSCULAR | Status: DC | PRN
  Administered 2016-11-06: 50 ug via INTRAVENOUS

## 2016-11-06 MED ORDER — MENTHOL 3 MG MT LOZG: 1.0000 | LOZENGE | OROMUCOSAL | Status: DC | PRN

## 2016-11-06 MED ORDER — EPHEDRINE 5 MG/ML INJ
INTRAVENOUS | Status: AC
  Filled 2016-11-06: qty 10

## 2016-11-06 MED ORDER — POLYETHYLENE GLYCOL 3350 17 G PO PACK: 17.0000 g | PACK | Freq: Every day | ORAL | Status: DC | PRN

## 2016-11-06 MED ORDER — PHENYLEPHRINE 40 MCG/ML (10ML) SYRINGE FOR IV PUSH (FOR BLOOD PRESSURE SUPPORT)
PREFILLED_SYRINGE | INTRAVENOUS | Status: AC
  Filled 2016-11-06: qty 10

## 2016-11-06 MED ORDER — ASPIRIN 81 MG PO CHEW
81.0000 mg | CHEWABLE_TABLET | Freq: Two times a day (BID) | ORAL | Status: DC
  Administered 2016-11-06 – 2016-11-07 (×2): 81 mg via ORAL
  Filled 2016-11-06 (×2): qty 1

## 2016-11-06 MED ORDER — ROPIVACAINE HCL 5 MG/ML IJ SOLN
INTRAMUSCULAR | Status: DC | PRN
  Administered 2016-11-06: 20 mL via PERINEURAL

## 2016-11-06 MED ORDER — METOPROLOL TARTRATE 50 MG PO TABS: 50.0000 mg | ORAL_TABLET | Freq: Two times a day (BID) | ORAL | Status: DC

## 2016-11-06 MED ORDER — HYDROMORPHONE HCL-NACL 0.5-0.9 MG/ML-% IV SOSY: 0.5000 mg | PREFILLED_SYRINGE | INTRAVENOUS | Status: DC | PRN

## 2016-11-06 MED ORDER — ALUM & MAG HYDROXIDE-SIMETH 200-200-20 MG/5ML PO SUSP: 30.0000 mL | ORAL | Status: DC | PRN

## 2016-11-06 MED ORDER — PANTOPRAZOLE SODIUM 40 MG PO TBEC
40.0000 mg | DELAYED_RELEASE_TABLET | Freq: Every day | ORAL | Status: DC
  Administered 2016-11-06 – 2016-11-07 (×2): 40 mg via ORAL
  Filled 2016-11-06: qty 1

## 2016-11-06 MED ORDER — FENTANYL CITRATE (PF) 100 MCG/2ML IJ SOLN
INTRAMUSCULAR | Status: DC | PRN
  Administered 2016-11-06 (×2): 50 ug via INTRAVENOUS

## 2016-11-06 MED ORDER — EPHEDRINE SULFATE 50 MG/ML IJ SOLN
INTRAMUSCULAR | Status: DC | PRN
  Administered 2016-11-06 (×2): 10 mg via INTRAVENOUS

## 2016-11-06 MED ORDER — FENTANYL CITRATE (PF) 100 MCG/2ML IJ SOLN
INTRAMUSCULAR | Status: AC
  Administered 2016-11-06: 50 ug via INTRAVENOUS
  Filled 2016-11-06: qty 2

## 2016-11-06 MED ORDER — ESTRADIOL 1 MG PO TABS
1.0000 mg | ORAL_TABLET | Freq: Every day | ORAL | Status: DC
  Administered 2016-11-06 – 2016-11-07 (×2): 1 mg via ORAL
  Filled 2016-11-06 (×2): qty 1

## 2016-11-06 MED ORDER — TRANEXAMIC ACID 1000 MG/10ML IV SOLN
1000.0000 mg | Freq: Once | INTRAVENOUS | Status: AC
  Administered 2016-11-06: 1000 mg via INTRAVENOUS
  Filled 2016-11-06: qty 1100

## 2016-11-06 MED ORDER — INSULIN ASPART 100 UNIT/ML ~~LOC~~ SOLN: 0.0000 [IU] | Freq: Three times a day (TID) | SUBCUTANEOUS | Status: DC

## 2016-11-06 MED ORDER — METOCLOPRAMIDE HCL 5 MG PO TABS: 5.0000 mg | ORAL_TABLET | Freq: Three times a day (TID) | ORAL | Status: DC | PRN

## 2016-11-06 SURGICAL SUPPLY — 45 items
ADH SKN CLS APL DERMABOND .7 (GAUZE/BANDAGES/DRESSINGS) ×1
BAG DECANTER FOR FLEXI CONT (MISCELLANEOUS) IMPLANT
BAG SPEC THK2 15X12 ZIP CLS (MISCELLANEOUS)
BAG ZIPLOCK 12X15 (MISCELLANEOUS) IMPLANT
BANDAGE ACE 6X5 VEL STRL LF (GAUZE/BANDAGES/DRESSINGS) ×3 IMPLANT
BLADE SAW RECIPROCATING 77.5 (BLADE) ×3 IMPLANT
BLADE SAW SGTL 13.0X1.19X90.0M (BLADE) ×3 IMPLANT
BONE CEMENT GENTAMICIN (Cement) ×3 IMPLANT
BOWL SMART MIX CTS (DISPOSABLE) ×3 IMPLANT
CAPT KNEE PARTIAL 2 ×2 IMPLANT
CEMENT BONE GENTAMICIN 40 (Cement) ×1 IMPLANT
CLOTH BEACON ORANGE TIMEOUT ST (SAFETY) ×3 IMPLANT
COVER SURGICAL LIGHT HANDLE (MISCELLANEOUS) ×3 IMPLANT
CUFF TOURN SGL QUICK 34 (TOURNIQUET CUFF) ×3
CUFF TRNQT CYL 34X4X40X1 (TOURNIQUET CUFF) ×1 IMPLANT
DERMABOND ADVANCED (GAUZE/BANDAGES/DRESSINGS) ×2
DERMABOND ADVANCED .7 DNX12 (GAUZE/BANDAGES/DRESSINGS) ×1 IMPLANT
DRAPE U-SHAPE 47X51 STRL (DRAPES) ×3 IMPLANT
DRESSING AQUACEL AG SP 3.5X10 (GAUZE/BANDAGES/DRESSINGS) ×1 IMPLANT
DRSG AQUACEL AG ADV 3.5X10 (GAUZE/BANDAGES/DRESSINGS) ×3 IMPLANT
DRSG AQUACEL AG SP 3.5X10 (GAUZE/BANDAGES/DRESSINGS) ×3
DURAPREP 26ML APPLICATOR (WOUND CARE) ×6 IMPLANT
ELECT REM PT RETURN 15FT ADLT (MISCELLANEOUS) ×3 IMPLANT
GLOVE BIOGEL M 7.0 STRL (GLOVE) IMPLANT
GLOVE BIOGEL PI IND STRL 7.5 (GLOVE) ×1 IMPLANT
GLOVE BIOGEL PI IND STRL 8.5 (GLOVE) ×1 IMPLANT
GLOVE BIOGEL PI INDICATOR 7.5 (GLOVE) ×2
GLOVE BIOGEL PI INDICATOR 8.5 (GLOVE) ×2
GLOVE ECLIPSE 8.0 STRL XLNG CF (GLOVE) ×3 IMPLANT
GLOVE ORTHO TXT STRL SZ7.5 (GLOVE) ×6 IMPLANT
GOWN STRL REUS W/TWL LRG LVL3 (GOWN DISPOSABLE) ×3 IMPLANT
GOWN STRL REUS W/TWL XL LVL3 (GOWN DISPOSABLE) ×3 IMPLANT
LEGGING LITHOTOMY PAIR STRL (DRAPES) ×3 IMPLANT
MANIFOLD NEPTUNE II (INSTRUMENTS) ×3 IMPLANT
PACK TOTAL KNEE CUSTOM (KITS) ×3 IMPLANT
SUT MNCRL AB 4-0 PS2 18 (SUTURE) ×3 IMPLANT
SUT STRATAFIX 0 PDS 27 VIOLET (SUTURE) ×3
SUT VIC AB 1 CT1 36 (SUTURE) ×3 IMPLANT
SUT VIC AB 2-0 CT1 27 (SUTURE) ×6
SUT VIC AB 2-0 CT1 TAPERPNT 27 (SUTURE) ×2 IMPLANT
SUTURE STRATFX 0 PDS 27 VIOLET (SUTURE) ×1 IMPLANT
SYR 50ML LL SCALE MARK (SYRINGE) ×3 IMPLANT
TRAY FOLEY W/METER SILVER 16FR (SET/KITS/TRAYS/PACK) IMPLANT
WATER STERILE IRR 1000ML POUR (IV SOLUTION) ×2 IMPLANT
WRAP KNEE MAXI GEL POST OP (GAUZE/BANDAGES/DRESSINGS) ×2 IMPLANT

## 2016-11-06 NOTE — Anesthesia Preprocedure Evaluation (Signed)
Anesthesia Evaluation  Patient identified by MRN, date of birth, ID band Patient awake    Reviewed: Allergy & Precautions, NPO status , Patient's Chart, lab work & pertinent test results, reviewed documented beta blocker date and time   Airway Mallampati: II  TM Distance: >3 FB Neck ROM: Full    Dental no notable dental hx.    Pulmonary neg pulmonary ROS, sleep apnea , former smoker,    Pulmonary exam normal breath sounds clear to auscultation       Cardiovascular hypertension, negative cardio ROS Normal cardiovascular exam Rhythm:Regular Rate:Normal     Neuro/Psych Anxiety negative neurological ROS  negative psych ROS   GI/Hepatic negative GI ROS, Neg liver ROS,   Endo/Other  negative endocrine ROSdiabetes, Type 2, Oral Hypoglycemic AgentsHypothyroidism   Renal/GU negative Renal ROS  negative genitourinary   Musculoskeletal negative musculoskeletal ROS (+) Arthritis ,   Abdominal   Peds negative pediatric ROS (+)  Hematology negative hematology ROS (+)   Anesthesia Other Findings   Reproductive/Obstetrics negative OB ROS                             Anesthesia Physical Anesthesia Plan  ASA: III  Anesthesia Plan: MAC and Spinal   Post-op Pain Management:  Regional for Post-op pain   Induction: Intravenous  PONV Risk Score and Plan: 2 and Ondansetron, Midazolam and Treatment may vary due to age or medical condition  Airway Management Planned: Simple Face Mask  Additional Equipment:   Intra-op Plan:   Post-operative Plan:   Informed Consent: I have reviewed the patients History and Physical, chart, labs and discussed the procedure including the risks, benefits and alternatives for the proposed anesthesia with the patient or authorized representative who has indicated his/her understanding and acceptance.   Dental advisory given  Plan Discussed with: CRNA  Anesthesia Plan  Comments:         Anesthesia Quick Evaluation

## 2016-11-06 NOTE — Interval H&P Note (Signed)
History and Physical Interval Note:  11/06/2016 7:23 AM  Katherine Best  has presented today for surgery, with the diagnosis of Right knee osteoarthritis  The various methods of treatment have been discussed with the patient and family. After consideration of risks, benefits and other options for treatment, the patient has consented to  Procedure(s) with comments: UNICOMPARTMENTAL RIGHT KNEE- Medially (Right) - 90 mins as a surgical intervention .  The patient's history has been reviewed, patient examined, no change in status, stable for surgery.  I have reviewed the patient's chart and labs.  Questions were answered to the patient's satisfaction.     [name]

## 2016-11-06 NOTE — Anesthesia Procedure Notes (Signed)
Anesthesia Regional Block: Adductor canal block   Pre-Anesthetic Checklist: ,, timeout performed, Correct Patient, Correct Site, Correct Laterality, Correct Procedure, Correct Position, site marked, Risks and benefits discussed,  Surgical consent,  Pre-op evaluation,  At surgeon's request and post-op pain management  Laterality: Right  Prep: chloraprep       Needles:  Injection technique: Single-shot  Needle Type: Stimiplex     Needle Length: 9cm  Needle Gauge: 21     Additional Needles:   Procedures: ultrasound guided,,,,,,,,  Narrative:  Start time: 11/06/2016 8:35 AM End time: 11/06/2016 8:40 AM Injection made incrementally with aspirations every 5 mL.  Performed by: Personally  Anesthesiologist: Candida Peeling RAY

## 2016-11-06 NOTE — Transfer of Care (Signed)
Immediate Anesthesia Transfer of Care Note  Patient: Katherine Best  Procedure(s) Performed: Procedure(s) with comments: UNICOMPARTMENTAL RIGHT KNEE- Medially (Right) - 90 mins  Patient Location: PACU  Anesthesia Type:Regional  Level of Consciousness: awake, alert  and oriented  Airway & Oxygen Therapy: Patient Spontanous Breathing and Patient connected to face mask oxygen  Post-op Assessment: Report given to RN and Post -op Vital signs reviewed and stable  Post vital signs: Reviewed and stable  Last Vitals:  Vitals:   11/06/16 0858 11/06/16 0900  BP:    Pulse: 67 67  Resp: 19 (!) 22  Temp:    SpO2: 99% 98%    Last Pain:  Vitals:   11/06/16 0630  TempSrc: Oral      Patients Stated Pain Goal: 4 (89/16/94 5038)  Complications: No apparent anesthesia complications

## 2016-11-06 NOTE — Progress Notes (Signed)
AssistedDr. Miller with right, ultrasound guided, adductor canal block. Side rails up, monitors on throughout procedure. See vital signs in flow sheet. Tolerated Procedure well.  

## 2016-11-06 NOTE — Op Note (Signed)
NAME: Katherine Best    MEDICAL RECORD NO.: [id number]   FACILITY: [facility]   DATE OF BIRTH: 1933/11/28  PHYSICIAN: Pietro Cassis. Alvan Dame, M.D.    DATE OF PROCEDURE: 11/06/2016    OPERATIVE REPORT   PREOPERATIVE DIAGNOSIS: Right knee medial compartment osteoarthritis.   POSTOPERATIVE DIAGNOSIS: Right knee medial compartment osteoarthritis.  PROCEDURE: Right partial knee replacement utilizing Biomet Oxford knee  component, size X-small femur, a right medial size AA tibial tray with a size 4 insert.   SURGEON: Pietro Cassis. Alvan Dame, M.D.   ASSISTANT: Molli Barrows, PAC.  Please note that Mr. Chabon was present for the entirety of the case,  utilized for preoperative positioning, perioperative retractor  management, general facilitation of the case and primary wound closure.   ANESTHESIA: Regional plus spinal.   SPECIMENS: None.   COMPLICATIONS: None.  DRAINS: None   TOURNIQUET TIME: 34 minutes at 250 mmHg.   INDICATIONS FOR PROCEDURE: The patient is a 83 y.o. patient of mine who presented for evaluation of right knee pain.  They presented with primary complaints of pain on the medial side of their knee. Radiographs revealed advanced medial compartment arthritis with specifically an antero-medial wear pattern.  There was bone on bone changes noted with subchondral sclerosis and osteophytes present. The patient has had progressive problems failing to respond to conservative measures of medications, injections and activity modification. Risks of infection, DVT, component failure, need for future revision surgery were all discussed and reviewed.  Consent was obtained for benefit of pain relief.   PROCEDURE IN DETAIL: The patient was brought to the operative theater.  Once adequate anesthesia, preoperative antibiotics, 2 gm of Ancef, 1 gm of Tranexamic Acid, and 10 mg of Decadron administered, the patient was positioned in supine position with a right thigh tourniquet  placed. The right lower  extremity was prepped and draped in sterile  fashion with the leg on the Oxford leg holder.  The leg was allowed to flex to 120 degrees. A time-out  was performed identifying the patient, planned procedure, and extremity.  The leg was exsanguinated, tourniquet elevated to 250 mmHg. A midline  incision was made from the proximal pole of the patella to the tibial tubercle. A  soft tissue plane was created and partial median arthrotomy was then  made to allow for subluxation of the patella. Following initial synovectomy and  debridement, the osteophytes were removed off the medial aspect of the  knee.   I sized the femur to be an X-small.  The X-small spoon was left in place to orient the tibial cut depth.  Attention was first directed to the tibia. The tibial  extramedullary guide was positioned over the anterior crest of the tibia  and pinned into position, and using a measured resection guide from the  [employer] system attached to the X-small spoon, a 4 mm resection was made off the proximal tibia. First  the reciprocating saw along the medial aspect of the tibial spines, then the oscillating saw.    At this point, I sized this cut surface seem to be best fit for a size AA tibial tray.  With the retractors out of the wound and the knee held at 90 degrees the 4 feeler gauge had appropriate tension on the medial ligament.   At this point, the femoral canal was opened with a drill and the  intramedullary rod passed. Then using the guide for a X-small posterior resection off  the posterior aspect of the femur was positioned over  the mid portion of the medial femoral condyle.  The orientation was set using the guide that mates the femoral guide to the intramedullary rod.  The 2 drill holes were made into the distal femur.  The posterior guide was then impacted into place and the posterior  femoral cut made.  At this point, I milled the distal femur with a size 4 spigot in place. At this point, we  did a trial reduction of the X-small femur, size AA tibial tray. At 90 degrees of flexion the size 4 feeler gauge has appropriate tension but at 20 degrees the the 2 feeler gauge was appropriate.  Given the difference in the tension between the knee in 90 degrees versus that in 20 degrees I had to place the 6 spigot into the femur and re-mill the distal femur.  Remaining bone was removed and debrided.  I repeated the trial reduction and found that now at both 90 degrees and 20 degrees the knee ligament were tension symmetrically with the 4 feeler gauge.  Given these findings, the trial femoral component was removed. Final preparation of tibia was carried out by pinning it in position. Then  using a reciprocating saw I removed bone for the keel. Further bone was  removed with an osteotome.  Trial reduction was now carried out with the X-small femur, the right medial keeled AA tibia, and a size 4 lollipop insert. The balance of the  ligaments appeared to be symmetric at 20 degrees and 90 degrees. Given  all these findings, the trial components were removed.   Cement was mixed. The final components were opened. The knee was irrigated with  normal saline solution. Then final debridements of the  soft tissue was carried out, I also drilled the sclerotic bone with a drill.  The final components were cemented with a single batch of cement in a  two-stage technique with the tibial component cemented first. The knee  was then brought  to 45 degrees of flexion with a size 4 feeler gauge, held with pressure for a minute and half.  After this the femoral component was cemented in place.  The knee was again held at 45 degrees of flexion while the cement fully cured.  Excess cement was removed throughout the knee. Tourniquet was let down  after 34 minutes. After the cement had fully cured and excessive cement  was removed throughout the knee there was no visualized cement present.   The final right medial  size 4 insert to match the X-small femur was chosen and snapped into position. We re-irrigated  the knee. The extensor mechanism  was then reapproximated using a #1 Vicryl and #0 Stratafix sutures with the knee in flexion. The  remaining wound was closed with 2-0 Vicryl and a running 4-0 Monocryl.  The knee was cleaned, dried, and dressed sterilely using Dermabond and  Aquacel dressing. The patient  was brought to the recovery room, Ace wrap in place, tolerating the  procedure well. She will be in the hospital for overnight observation.  We will initiate physical therapy and progress to ambulate.     Pietro Cassis Alvan Dame, M.D.

## 2016-11-06 NOTE — Evaluation (Signed)
Physical Therapy Evaluation Patient Details Name: Katherine Best MRN: [id number] DOB: 29-Apr-1933 Today's Date: 11/06/2016   History of Present Illness  pt s/p R uni knee atrhoplasty, h/o Bilateral neuropathy, Bilateral ankle/foot surgeries, and h/o back pain with injections recently.   Clinical Impression  Pt is s/p R uni knee arthoplasty resulting in the deficits listed below (see PT Problem List). Pt will benefit from acute PT to increase their independence and safety with mobility to allow discharge to the venue listed below. May need a little more time due to pt used RW pre surgery due to falls with neuropathy and husband not able to assist much as well. Will continue to update her progress and work with pt while here in acute care.       Follow Up Recommendations DC plan and follow up therapy as arranged by surgeon    Equipment Recommendations  None recommended by PT    Recommendations for Other Services       Precautions / Restrictions Precautions Precautions: Knee Restrictions Weight Bearing Restrictions: No      Mobility  Bed Mobility Overal bed mobility: Needs Assistance Bed Mobility: Supine to Sit;Sit to Supine     Supine to sit: Mod assist     General bed mobility comments: assist with her  upper body and scooting hips as well as assisting R LE .   Transfers Overall transfer level: Needs assistance Equipment used: Rolling walker (2 wheeled) Transfers: Sit to/from Stand Sit to Stand: Mod assist         General transfer comment: Sit to stand she states at home she has a hard time getting up sometimes. Stand to sit was very controlled with no issues or assist required except for vocal cueing .   Ambulation/Gait Ambulation/Gait assistance: Min guard Ambulation Distance (Feet): 40 Feet Assistive device: Rolling walker (2 wheeled) Gait Pattern/deviations: Step-to pattern     General Gait Details: occassionally she took small steps step through but not  complete stride length, very small. Cues for pattern of step to to avoid knee buckling , but had to continue to remind her.   Stairs            Wheelchair Mobility    Modified Rankin (Stroke Patients Only)       Balance                                             Pertinent Vitals/Pain Pain Assessment: 0-10 Pain Score: 1  Pain Location: just  a little in the lateral aspect of the R knee  Pain Descriptors / Indicators: Discomfort Pain Intervention(s): Ice applied    Home Living Family/patient expects to be discharged to:: Private residence Living Arrangements: Spouse/significant other Available Help at Discharge: Family Type of Home: House Home Access: Stairs to enter Entrance Stairs-Rails: Can reach both Entrance Stairs-Number of Steps: 3 (husband worried about steps. ) Home Layout: One level Home Equipment: Walker - 2 wheels;Walker - 4 wheels;Cane - single point      Prior Function Level of Independence: Independent with assistive device(s)         Comments: pt states she uses RW at night from bed to bathroom due to h/o falls due to neuropathy at night. Also uses cane at time, and 4 wheeled RW depending on how her knee is doing. she does and can walk without any AD  at times as well. Still drives even with neuropathy. "she feels how much she is pushing the gas pedal in her knee"      Hand Dominance        Extremity/Trunk Assessment        Lower Extremity Assessment Lower Extremity Assessment: RLE deficits/detail RLE Deficits / Details: general limited ROM 0-40 supine and SLR needing assistance so grossly 3/5 for quad at this time.  RLE Sensation: decreased light touch;history of peripheral neuropathy       Communication   Communication: No difficulties  Cognition Arousal/Alertness: Awake/alert Behavior During Therapy: WFL for tasks assessed/performed Overall Cognitive Status: Within Functional Limits for tasks assessed                                         General Comments      Exercises Total Joint Exercises Ankle Circles/Pumps: Supine;5 reps;AROM Quad Sets: AROM;Right;Supine;5 reps Heel Slides: AAROM;Supine;5 reps;Right Straight Leg Raises: AAROM;Right;5 reps;Supine Goniometric ROM: grossly supine 0-40   Assessment/Plan    PT Assessment Patient needs continued PT services  PT Problem List Decreased strength;Decreased range of motion;Decreased activity tolerance;Decreased mobility       PT Treatment Interventions DME instruction;Gait training;Stair training;Functional mobility training;Therapeutic activities;Therapeutic exercise;Patient/family education    PT Goals (Current goals can be found in the Care Plan section)  Acute Rehab PT Goals Patient Stated Goal: I want to be able to go home and not have to cook!!  PT Goal Formulation: With patient/family Time For Goal Achievement: 11/20/16 Potential to Achieve Goals: Good    Frequency 7X/week   Barriers to discharge        Co-evaluation               AM-PAC PT "6 Clicks" Daily Activity  Outcome Measure Difficulty turning over in bed (including adjusting bedclothes, sheets and blankets)?: Total Difficulty moving from lying on back to sitting on the side of the bed? : Total Difficulty sitting down on and standing up from a chair with arms (e.g., wheelchair, bedside commode, etc,.)?: Total Help needed moving to and from a bed to chair (including a wheelchair)?: A Little Help needed walking in hospital room?: A Little Help needed climbing 3-5 steps with a railing? : A Lot 6 Click Score: 11    End of Session Equipment Utilized During Treatment: Gait belt Activity Tolerance: Patient tolerated treatment well Patient left: in chair;with call bell/phone within reach;with family/visitor present;with bed alarm set Nurse Communication: Mobility status PT Visit Diagnosis: Other abnormalities of gait and mobility (R26.89)     Time: 1572-6203 PT Time Calculation (min) (ACUTE ONLY): 27 min   Charges:   PT Evaluation $PT Eval Low Complexity: 1 Low PT Treatments $Gait Training: 8-22 mins   PT G Codes:   PT G-Codes **NOT FOR INPATIENT CLASS** Functional Assessment Tool Used: AM-PAC 6 Clicks Basic Mobility;Clinical judgement Functional Limitation: Mobility: Walking and moving around Mobility: Walking and Moving Around Current Status (T5974): At least 20 percent but less than 40 percent impaired, limited or restricted Mobility: Walking and Moving Around Goal Status [phone]): At least 1 percent but less than 20 percent impaired, limited or restricted    Clide Dales, PT Pager: [phone] 11/06/2016   Mckenzy Salazar, Gatha Mayer 11/06/2016, 6:14 PM

## 2016-11-07 DIAGNOSIS — E11628 Type 2 diabetes mellitus with other skin complications: Secondary | ICD-10-CM | POA: Diagnosis not present

## 2016-11-07 DIAGNOSIS — M1711 Unilateral primary osteoarthritis, right knee: Secondary | ICD-10-CM | POA: Diagnosis not present

## 2016-11-07 DIAGNOSIS — E039 Hypothyroidism, unspecified: Secondary | ICD-10-CM | POA: Diagnosis not present

## 2016-11-07 DIAGNOSIS — E785 Hyperlipidemia, unspecified: Secondary | ICD-10-CM | POA: Diagnosis not present

## 2016-11-07 DIAGNOSIS — K589 Irritable bowel syndrome without diarrhea: Secondary | ICD-10-CM | POA: Diagnosis not present

## 2016-11-07 DIAGNOSIS — I10 Essential (primary) hypertension: Secondary | ICD-10-CM | POA: Diagnosis not present

## 2016-11-07 LAB — CBC
HCT: 26.3 % — ABNORMAL LOW (ref 36.0–46.0)
HEMOGLOBIN: 8.8 g/dL — AB (ref 12.0–15.0)
MCH: 30.8 pg (ref 26.0–34.0)
MCHC: 33.5 g/dL (ref 30.0–36.0)
MCV: 92 fL (ref 78.0–100.0)
PLATELETS: 260 10*3/uL (ref 150–400)
RBC: 2.86 MIL/uL — ABNORMAL LOW (ref 3.87–5.11)
RDW: 13.7 % (ref 11.5–15.5)
WBC: 5.9 10*3/uL (ref 4.0–10.5)

## 2016-11-07 LAB — GLUCOSE, CAPILLARY
GLUCOSE-CAPILLARY: 108 mg/dL — AB (ref 65–99)
Glucose-Capillary: 135 mg/dL — ABNORMAL HIGH (ref 65–99)

## 2016-11-07 LAB — BASIC METABOLIC PANEL
Anion gap: 9 (ref 5–15)
BUN: 12 mg/dL (ref 6–20)
CALCIUM: 8 mg/dL — AB (ref 8.9–10.3)
CO2: 28 mmol/L (ref 22–32)
CREATININE: 0.66 mg/dL (ref 0.44–1.00)
Chloride: 102 mmol/L (ref 101–111)
Glucose, Bld: 108 mg/dL — ABNORMAL HIGH (ref 65–99)
Potassium: 3.2 mmol/L — ABNORMAL LOW (ref 3.5–5.1)
SODIUM: 139 mmol/L (ref 135–145)

## 2016-11-07 MED ORDER — DOCUSATE SODIUM 100 MG PO CAPS: 100.0000 mg | ORAL_CAPSULE | Freq: Two times a day (BID) | ORAL | 0 refills | Status: DC

## 2016-11-07 MED ORDER — POLYETHYLENE GLYCOL 3350 17 G PO PACK: 17.0000 g | PACK | Freq: Every day | ORAL | 0 refills | Status: DC | PRN

## 2016-11-07 MED ORDER — FERROUS SULFATE 325 (65 FE) MG PO TABS: 325.0000 mg | ORAL_TABLET | Freq: Two times a day (BID) | ORAL | 0 refills | Status: AC

## 2016-11-07 MED ORDER — ASPIRIN 81 MG PO CHEW: 81.0000 mg | CHEWABLE_TABLET | Freq: Two times a day (BID) | ORAL | 0 refills | Status: DC

## 2016-11-07 MED ORDER — HYDROCODONE-ACETAMINOPHEN 7.5-325 MG PO TABS: 1.0000 | ORAL_TABLET | Freq: Four times a day (QID) | ORAL | 0 refills | Status: DC

## 2016-11-07 MED ORDER — POTASSIUM CHLORIDE CRYS ER 20 MEQ PO TBCR
40.0000 meq | EXTENDED_RELEASE_TABLET | Freq: Once | ORAL | Status: AC
  Administered 2016-11-07: 40 meq via ORAL
  Filled 2016-11-07: qty 2

## 2016-11-07 NOTE — Evaluation (Signed)
Occupational Therapy Evaluation Patient Details Name: DEBORRA PHEGLEY MRN: [id number] DOB: Dec 22, 1933 Today's Date: 11/07/2016    History of Present Illness pt s/p R uni knee atrhoplasty, h/o Bilateral neuropathy, Bilateral ankle/foot surgeries, and h/o back pain with injections recently.    Clinical Impression   This 83 year old female was admitted for the above sx. All education was completed. No further OT is needed at this time    Follow Up Recommendations  Supervision/Assistance - 24 hour    Equipment Recommendations  None recommended by OT    Recommendations for Other Services       Precautions / Restrictions Precautions Precautions: Knee Restrictions Weight Bearing Restrictions: No      Mobility Bed Mobility         Supine to sit: Min assist     General bed mobility comments: from flat bed, no rails.  cues for sequence  Transfers   Equipment used: Rolling walker (2 wheeled)   Sit to Stand: Min guard;Min assist         General transfer comment: Min A from bed; min guard from toilet with rail    Balance                                           ADL either performed or assessed with clinical judgement   ADL Overall ADL's : Needs assistance/impaired     Grooming: Supervision/safety;Standing   Upper Body Bathing: Set up;Sitting   Lower Body Bathing: Minimal assistance;Sit to/from stand   Upper Body Dressing : Set up;Sitting   Lower Body Dressing: Moderate assistance;Sit to/from stand   Toilet Transfer: Minimal assistance;Ambulation;BSC;RW   Toileting- Clothing Manipulation and Hygiene: Sit to/from stand;Min guard   Tub/ Shower Transfer: Walk-in shower;Min guard;Ambulation     General ADL Comments: handout given for shower sequence.  Husband will assist as needed. Encouraged her to wake him up at night to walk to bathroom wtih her     Vision         Perception     Praxis      Pertinent Vitals/Pain Pain  Assessment: Faces Faces Pain Scale: Hurts little more Pain Location: R KNEE Pain Descriptors / Indicators: Discomfort;Aching Pain Intervention(s): Limited activity within patient's tolerance;Monitored during session;Premedicated before session;Repositioned;Ice applied     Hand Dominance     Extremity/Trunk Assessment Upper Extremity Assessment Upper Extremity Assessment: Overall WFL for tasks assessed           Communication Communication Communication: No difficulties   Cognition Arousal/Alertness: Awake/alert Behavior During Therapy: WFL for tasks assessed/performed Overall Cognitive Status: Within Functional Limits for tasks assessed                                     General Comments       Exercises     Shoulder Instructions      Home Living Family/patient expects to be discharged to:: Private residence Living Arrangements: Spouse/significant other Available Help at Discharge: Family               Bathroom Shower/Tub: Walk-in Corporate treasurer Toilet: Handicapped height     Home Equipment: Shower seat;Grab bars - toilet;Grab bars - tub/shower          Prior Functioning/Environment Level of Independence: Independent with assistive device(s)  OT Problem List:        OT Treatment/Interventions:      OT Goals(Current goals can be found in the care plan section) Acute Rehab OT Goals Patient Stated Goal: I want to be able to go home and not have to cook!!  OT Goal Formulation: All assessment and education complete, DC therapy  OT Frequency:     Barriers to D/C:            Co-evaluation              AM-PAC PT "6 Clicks" Daily Activity     Outcome Measure Help from another person eating meals?: None Help from another person taking care of personal grooming?: A Little Help from another person toileting, which includes using toliet, bedpan, or urinal?: A Little Help from another person bathing  (including washing, rinsing, drying)?: A Lot Help from another person to put on and taking off regular upper body clothing?: A Little Help from another person to put on and taking off regular lower body clothing?: A Lot 6 Click Score: 17   End of Session    Activity Tolerance: Patient tolerated treatment well Patient left: in chair;with call bell/phone within reach;with bed alarm set;with family/visitor present  OT Visit Diagnosis: Pain Pain - Right/Left: Right Pain - part of body: Knee                Time: 7619-5093 OT Time Calculation (min): 20 min Charges:  OT General Charges $OT Visit: 1 Procedure OT Evaluation $OT Eval Low Complexity: 1 Procedure G-Codes: OT G-codes **NOT FOR INPATIENT CLASS** Functional Assessment Tool Used: AM-PAC 6 Clicks Daily Activity Functional Limitation: Self care Self Care Current Status (O6712): At least 40 percent but less than 60 percent impaired, limited or restricted Self Care Goal Status (W5809): At least 40 percent but less than 60 percent impaired, limited or restricted Self Care Discharge Status [phone]): At least 40 percent but less than 60 percent impaired, limited or restricted   Lesle Chris, OTR/L [phone] 11/07/2016  Jamesyn Moorefield 11/07/2016, 10:52 AM

## 2016-11-07 NOTE — Progress Notes (Addendum)
Subjective: 1 Day Post-Op Procedure(s) (LRB): UNICOMPARTMENTAL RIGHT KNEE- Medially (Right) Patient reports pain as mild.   Patient seen in rounds with Dr. Alvan Dame. Patient is well, and has had no acute complaints or problems other than soreness in the right knee. No issues overnight. No SOB or chest pain.    Objective: Vital signs in last 24 hours: Temp:  [96.9 F (36.1 C)-98.7 F (37.1 C)] 98.6 F (37 C) (08/14 0547) Pulse Rate:  [61-80] 78 (08/14 0650) Resp:  [8-45] 16 (08/14 0650) BP: (101-159)/(43-71) 103/46 (08/14 0547) SpO2:  [90 %-100 %] 96 % (08/14 0650) Weight:  [81.6 kg (180 lb)] 81.6 kg (180 lb) (08/13 0716)  Intake/Output from previous day:  Intake/Output Summary (Last 24 hours) at 11/07/16 0711 Last data filed at 11/07/16 0650  Gross per 24 hour  Intake          4475.84 ml  Output             3450 ml  Net          1025.84 ml     Labs:  Recent Labs  11/07/16 0543  HGB 8.8*    Recent Labs  11/07/16 0543  WBC 5.9  RBC 2.86*  HCT 26.3*  PLT 260    Recent Labs  11/07/16 0543  NA 139  K 3.2*  CL 102  CO2 28  BUN 12  CREATININE 0.66  GLUCOSE 108*  CALCIUM 8.0*   EXAM General - Patient is Alert and Oriented Extremity - Neurologically intact Intact pulses distally Dorsiflexion/Plantar flexion intact Compartment soft Dressing/Incision - clean, dry, no drainage Motor Function - intact, moving foot and toes well on exam.   Past Medical History:  Diagnosis Date  . Abdominal pain, right lower quadrant   . Bursitis   . Cataracts, both eyes    soon to have cataract surg as of 03/2015  . 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 Diverticulosis of colon    noted on colonoscopy 2008  . Dyspnea    a. 09/2011 CTA Chest: No PE, Ca2+ cors;  b. 09/2011 R&L heart Cath: Relatively nl R heart pressures, nl co/ci, nonobs cad, nl EF.  12/2015 stress test normal, echo with grd II DD--likely explains DOE.  Marland Kitchen External hemorrhoid   .  Fatigue    w/ ? excessive daytime somnolence; ? OSA--eval by Dr. Halford Chessman 06/2016, home sleep study ordered.  . Former smoker    30 pack-yrs, quit 2005  . Heart murmur    hx of  . Hot flashes    restarted estradiol 01/2015  . Hx of adenomatous polyp of colon 08/25/2016   07/2016 no recall  . Hyperlipidemia   . Hypertension   . Hypothyroidism   . IBS (irritable bowel syndrome)    IBS-D (Dr. Carlean Purl) + ? worsened by GI side effect of metformin  . Interstitial cystitis   . Liver function study, abnormal   . Lumbar degenerative disc disease    Facet-mediated pain per Dr. Nelva Bush.  Has gotten repeated LB injections, most recently April 2016 in [place].  . Macular degeneration    left eye  . Menopausal syndrome   . Morton's neuroma   . Obesity, unspecified   . OSA on CPAP 07/2016   HST 07/27/16 >> AHI 6.2, SaO2 low 79%.  CPAP recommended by pulm  . Other specified gastritis without mention of hemorrhage    hx of antral gastritis on EGD 05/2009  .  Peripheral neuropathy    per EMG 11/2012.  Intolerant of gabapentin, failed pamelor.  Radiofrequency neurotomy 12/11/14 helped LB pain (Dr. Nelva Bush)  . Pneumonia   . Recurrent UTI    on daily antibiotic prophylaxis by her urologist.  . RLS (restless legs syndrome)   . Sleep apnea   . Tarsal tunnel syndrome   . Unspecified venous (peripheral) insufficiency     Assessment/Plan: 1 Day Post-Op Procedure(s) (LRB): UNICOMPARTMENTAL RIGHT KNEE- Medially (Right) Active Problems:   Status post unilateral knee replacement, right  Estimated body mass index is 32.92 kg/m as calculated from the following:   Height as of this encounter: 5\' 2"  (1.575 m).   Weight as of this encounter: 81.6 kg (180 lb). Advance diet Up with therapy  DVT Prophylaxis - Aspirin Weight-Bearing as tolerated   She is doing fair this morning. Will continue to monitor BP. Continue fluids until after therapy in case she needs a bolus. Will supplement potassium this morning 15meq. Plan  for DC home this afternoon is she progresses well. Will hold BP meds this morning.   Ardeen Jourdain, PA-C Orthopaedic Surgery 11/07/2016, 7:11 AM

## 2016-11-07 NOTE — Discharge Instructions (Signed)

## 2016-11-07 NOTE — Progress Notes (Signed)
Discharge planning, no HH needs identified. Plan for OP PT, has DME. [phone] 

## 2016-11-07 NOTE — Progress Notes (Signed)
Physical Therapy Treatment Patient Details Name: Katherine Best MRN: [id number] DOB: [birth year] Today's Date: 11/07/2016    History of Present Illness pt s/p R uni knee atrhoplasty, h/o Bilateral neuropathy, Bilateral ankle/foot surgeries, and h/o back pain with injections recently.     PT Comments    POD # 1 pm session Practiced stairs with spouse present.  Assisted with amb a greater distance.  Instructed on HEP until OP starts.  Instructed on use of ICE.    Follow Up Recommendations  DC plan and follow up therapy as arranged by surgeon;Outpatient PT     Equipment Recommendations  None recommended by PT    Recommendations for Other Services       Precautions / Restrictions Precautions Precautions: Knee Restrictions Weight Bearing Restrictions: No    Mobility  Bed Mobility               General bed mobility comments: OOB in recliner   Transfers Overall transfer level: Needs assistance Equipment used: Rolling walker (2 wheeled) Transfers: Sit to/from Stand Sit to Stand: Supervision;Min guard         General transfer comment: 25% VC's on safety with turns  Ambulation/Gait Ambulation/Gait assistance: Supervision;Min guard Ambulation Distance (Feet): 67 Feet Assistive device: Rolling walker (2 wheeled) Gait Pattern/deviations: Step-to pattern Gait velocity: decreased   General Gait Details: 25% VC's on proper walker to self distance   Stairs Stairs: Yes   Stair Management: Two rails;Forwards Number of Stairs: 2 General stair comments: with spouse practiced twice 50% VC's on proper sequencing  Wheelchair Mobility    Modified Rankin (Stroke Patients Only)       Balance                                            Cognition Arousal/Alertness: Awake/alert Behavior During Therapy: WFL for tasks assessed/performed Overall Cognitive Status: Within Functional Limits for tasks assessed                                        Exercises      General Comments        Pertinent Vitals/Pain Pain Assessment: 0-10 Pain Score: 2  Pain Location: R KNEE Pain Descriptors / Indicators: Discomfort;Aching Pain Intervention(s): Monitored during session;Repositioned;Ice applied    Home Living                      Prior Function            PT Goals (current goals can now be found in the care plan section) Progress towards PT goals: Progressing toward goals    Frequency    7X/week      PT Plan      Co-evaluation              AM-PAC PT "6 Clicks" Daily Activity  Outcome Measure  Difficulty turning over in bed (including adjusting bedclothes, sheets and blankets)?: Total Difficulty moving from lying on back to sitting on the side of the bed? : Total Difficulty sitting down on and standing up from a chair with arms (e.g., wheelchair, bedside commode, etc,.)?: Total Help needed moving to and from a bed to chair (including a wheelchair)?: A Little Help needed walking in hospital room?: A Little Help needed climbing 3-5  steps with a railing? : A Little 6 Click Score: 12    End of Session Equipment Utilized During Treatment: Gait belt Activity Tolerance: Patient tolerated treatment well Patient left: in chair;with call bell/phone within reach;with family/visitor present;with bed alarm set Nurse Communication: Mobility status PT Visit Diagnosis: Other abnormalities of gait and mobility (R26.89)     Time: 1355-1420 PT Time Calculation (min) (ACUTE ONLY): 25 min  Charges:  $Gait Training: 8-22 mins $Therapeutic Activity: 8-22 mins                    G Codes:       Rica Koyanagi  PTA [facility]  Acute  Rehab Pager      [phone]

## 2016-11-07 NOTE — Progress Notes (Signed)
Physical Therapy Treatment Patient Details Name: Katherine Best MRN: [id number] DOB: Aug 23, 1933 Today's Date: 11/07/2016    History of Present Illness pt s/p R uni knee atrhoplasty, h/o Bilateral neuropathy, Bilateral ankle/foot surgeries, and h/o back pain with injections recently.     PT Comments    POD # 1 am session Assisted with amb a greater distance in hallway then performed some TKR TE's followed by ICE.   Follow Up Recommendations  DC plan and follow up therapy as arranged by surgeon;Outpatient PT     Equipment Recommendations  None recommended by PT    Recommendations for Other Services       Precautions / Restrictions Precautions Precautions: Knee Restrictions Weight Bearing Restrictions: No    Mobility  Bed Mobility               General bed mobility comments: OOB in recliner   Transfers Overall transfer level: Needs assistance Equipment used: Rolling walker (2 wheeled) Transfers: Sit to/from Stand Sit to Stand: Supervision;Min guard         General transfer comment: 25% VC's on safety with turns  Ambulation/Gait Ambulation/Gait assistance: Supervision;Min guard Ambulation Distance (Feet): 67 Feet Assistive device: Rolling walker (2 wheeled) Gait Pattern/deviations: Step-to pattern Gait velocity: decreased   General Gait Details: 25% VC's on proper walker to self distance   Stairs            Wheelchair Mobility    Modified Rankin (Stroke Patients Only)       Balance                                            Cognition Arousal/Alertness: Awake/alert Behavior During Therapy: WFL for tasks assessed/performed Overall Cognitive Status: Within Functional Limits for tasks assessed                                        Exercises   Total Knee Replacement TE's 10 reps B LE ankle pumps 10 reps towel squeezes 10 reps knee presses 10 reps heel slides  10 reps SAQ's 10 reps SLR's 10 reps  ABD Followed by ICE     General Comments        Pertinent Vitals/Pain Pain Assessment: 0-10 Pain Score: 2  Pain Location: R KNEE Pain Descriptors / Indicators: Discomfort;Aching Pain Intervention(s): Monitored during session;Repositioned;Ice applied    Home Living                      Prior Function            PT Goals (current goals can now be found in the care plan section) Progress towards PT goals: Progressing toward goals    Frequency    7X/week      PT Plan      Co-evaluation              AM-PAC PT "6 Clicks" Daily Activity  Outcome Measure  Difficulty turning over in bed (including adjusting bedclothes, sheets and blankets)?: Total Difficulty moving from lying on back to sitting on the side of the bed? : Total Difficulty sitting down on and standing up from a chair with arms (e.g., wheelchair, bedside commode, etc,.)?: Total Help needed moving to and from a bed to chair (including a wheelchair)?: A  Little Help needed walking in hospital room?: A Little Help needed climbing 3-5 steps with a railing? : A Little 6 Click Score: 12    End of Session Equipment Utilized During Treatment: Gait belt Activity Tolerance: Patient tolerated treatment well Patient left: in chair;with call bell/phone within reach;with family/visitor present;with bed alarm set Nurse Communication: Mobility status PT Visit Diagnosis: Other abnormalities of gait and mobility (R26.89)     Time: 9539-6728 PT Time Calculation (min) (ACUTE ONLY): 30 min  Charges:  $Gait Training: 8-22 mins $Therapeutic Exercise: 8-22 mins                    G Codes:       Rica Koyanagi  PTA [facility]  Acute  Rehab Pager      [phone]

## 2016-11-08 ENCOUNTER — Encounter (HOSPITAL_COMMUNITY): Payer: Self-pay | Admitting: Orthopedic Surgery

## 2016-11-08 MED ORDER — BUPIVACAINE IN DEXTROSE 0.75-8.25 % IT SOLN
INTRATHECAL | Status: DC | PRN
  Administered 2016-11-06: 2 mL via INTRATHECAL

## 2016-11-08 NOTE — Discharge Summary (Signed)
Physician Discharge Summary   Patient ID: Katherine Best MRN: [id number] DOB/AGE: 81-03-1933 81 y.o.  Admit date: 11/06/2016 Discharge date: 11/07/2016  Primary Diagnosis: Primary osteoarthritis medial compartment right knee  Admission Diagnoses:  Past Medical History:  Diagnosis Date  . Abdominal pain, right lower quadrant   . Bursitis   . Cataracts, both eyes    soon to have cataract surg as of 03/2015  . 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 Diverticulosis of colon    noted on colonoscopy 2008  . Dyspnea    a. 09/2011 CTA Chest: No PE, Ca2+ cors;  b. 09/2011 R&L heart Cath: Relatively nl R heart pressures, nl co/ci, nonobs cad, nl EF.  12/2015 stress test normal, echo with grd II DD--likely explains DOE.  Marland Kitchen External hemorrhoid   . Fatigue    w/ ? excessive daytime somnolence; ? OSA--eval by Dr. Halford Chessman 06/2016, home sleep study ordered.  . Former smoker    30 pack-yrs, quit 2005  . Heart murmur    hx of  . Hot flashes    restarted estradiol 01/2015  . Hx of adenomatous polyp of colon 08/25/2016   07/2016 no recall  . Hyperlipidemia   . Hypertension   . Hypothyroidism   . IBS (irritable bowel syndrome)    IBS-D (Dr. Carlean Purl) + ? worsened by GI side effect of metformin  . Interstitial cystitis   . Liver function study, abnormal   . Lumbar degenerative disc disease    Facet-mediated pain per Dr. Nelva Bush.  Has gotten repeated LB injections, most recently April 2016 in [place].  . Macular degeneration    left eye  . Menopausal syndrome   . Morton's neuroma   . Obesity, unspecified   . OSA on CPAP 07/2016   HST 07/27/16 >> AHI 6.2, SaO2 low 79%.  CPAP recommended by pulm  . Other specified gastritis without mention of hemorrhage    hx of antral gastritis on EGD 05/2009  . Peripheral neuropathy    per EMG 11/2012.  Intolerant of gabapentin, failed pamelor.  Radiofrequency neurotomy 12/11/14 helped LB pain (Dr. Nelva Bush)  . Pneumonia   . Recurrent UTI    on daily  antibiotic prophylaxis by her urologist.  . RLS (restless legs syndrome)   . Sleep apnea   . Tarsal tunnel syndrome   . Unspecified venous (peripheral) insufficiency    Discharge Diagnoses:   Active Problems:   Status post unilateral knee replacement, right  Estimated body mass index is 32.92 kg/m as calculated from the following:   Height as of this encounter: 5' 2"  (1.575 m).   Weight as of this encounter: 81.6 kg (180 lb).  Procedure:  Procedure(s) (LRB): UNICOMPARTMENTAL RIGHT KNEE- Medially (Right)   Consults: None  HPI: Katherine Best, 83 y.o. female , has a history of pain and functional disability in the right and has failed non-surgical conservative treatments for greater than 12 weeks to include NSAID's and/or analgesics, corticosteriod injections, viscosupplementation injections, use of assistive devices and activity modification.  Onset of symptoms was gradual, starting 1+ years ago with gradually worsening course since that time. The patient noted no past surgery on the right knee(s).  Patient currently rates pain in the right knee(s) at 9 out of 10 with activity. Patient has worsening of pain with activity and weight bearing, pain that interferes with activities of daily living, pain with passive range of motion, crepitus and joint swelling.  Patient has evidence of  periarticular osteophytes and joint space narrowing of the medial compartment by imaging studies.  There is no active infection.  Risks, benefits and expectations were discussed with the patient.  Risks including but not limited to the risk of anesthesia, blood clots, nerve damage, blood vessel damage, failure of the prosthesis, infection and up to and including death.  Patient understand the risks, benefits and expectations and wishes to proceed with surgery.   Laboratory Data: Admission on 11/06/2016, Discharged on 11/07/2016  Component Date Value Ref Range Status  . Glucose-Capillary 11/06/2016 119* 65 - 99 mg/dL  Final  . Comment 1 11/06/2016 Notify RN   Final  . Glucose-Capillary 11/06/2016 100* 65 - 99 mg/dL Final  . WBC 11/07/2016 5.9  4.0 - 10.5 K/uL Final  . RBC 11/07/2016 2.86* 3.87 - 5.11 MIL/uL Final  . Hemoglobin 11/07/2016 8.8* 12.0 - 15.0 g/dL Final  . HCT 11/07/2016 26.3* 36.0 - 46.0 % Final  . MCV 11/07/2016 92.0  78.0 - 100.0 fL Final  . MCH 11/07/2016 30.8  26.0 - 34.0 pg Final  . MCHC 11/07/2016 33.5  30.0 - 36.0 g/dL Final  . RDW 11/07/2016 13.7  11.5 - 15.5 % Final  . Platelets 11/07/2016 260  150 - 400 K/uL Final  . Sodium 11/07/2016 139  135 - 145 mmol/L Final  . Potassium 11/07/2016 3.2* 3.5 - 5.1 mmol/L Final  . Chloride 11/07/2016 102  101 - 111 mmol/L Final  . CO2 11/07/2016 28  22 - 32 mmol/L Final  . Glucose, Bld 11/07/2016 108* 65 - 99 mg/dL Final  . BUN 11/07/2016 12  6 - 20 mg/dL Final  . Creatinine, Ser 11/07/2016 0.66  0.44 - 1.00 mg/dL Final  . Calcium 11/07/2016 8.0* 8.9 - 10.3 mg/dL Final  . GFR calc non Af Amer 11/07/2016 >60  >60 mL/min Final  . GFR calc Af Amer 11/07/2016 >60  >60 mL/min Final   Comment: (NOTE) The eGFR has been calculated using the CKD EPI equation. This calculation has not been validated in all clinical situations. eGFR's persistently <60 mL/min signify possible Chronic Kidney Disease.   . Anion gap 11/07/2016 9  5 - 15 Final  . Glucose-Capillary 11/06/2016 138* 65 - 99 mg/dL Final  . Glucose-Capillary 11/06/2016 142* 65 - 99 mg/dL Final  . Glucose-Capillary 11/07/2016 108* 65 - 99 mg/dL Final  . Glucose-Capillary 11/07/2016 135* 65 - 99 mg/dL Final  Hospital Outpatient Visit on 10/30/2016  Component Date Value Ref Range Status  . ABO/RH(D) 10/30/2016 B POS   Final  . Antibody Screen 10/30/2016 NEG   Final  . Sample Expiration 10/30/2016 11/10/2016   Final  . Extend sample reason 10/30/2016 NO TRANSFUSIONS OR PREGNANCY IN THE PAST 3 MONTHS   Final  . Sodium 10/30/2016 140  135 - 145 mmol/L Final  . Potassium 10/30/2016 4.2  3.5 -  5.1 mmol/L Final  . Chloride 10/30/2016 101  101 - 111 mmol/L Final  . CO2 10/30/2016 28  22 - 32 mmol/L Final  . Glucose, Bld 10/30/2016 123* 65 - 99 mg/dL Final  . BUN 10/30/2016 17  6 - 20 mg/dL Final  . Creatinine, Ser 10/30/2016 0.76  0.44 - 1.00 mg/dL Final  . Calcium 10/30/2016 9.2  8.9 - 10.3 mg/dL Final  . GFR calc non Af Amer 10/30/2016 >60  >60 mL/min Final  . GFR calc Af Amer 10/30/2016 >60  >60 mL/min Final   Comment: (NOTE) The eGFR has been calculated using the CKD EPI equation.  This calculation has not been validated in all clinical situations. eGFR's persistently <60 mL/min signify possible Chronic Kidney Disease.   . Anion gap 10/30/2016 11  5 - 15 Final  . WBC 10/30/2016 7.5  4.0 - 10.5 K/uL Final  . RBC 10/30/2016 3.67* 3.87 - 5.11 MIL/uL Final  . Hemoglobin 10/30/2016 11.1* 12.0 - 15.0 g/dL Final  . HCT 10/30/2016 35.0* 36.0 - 46.0 % Final  . MCV 10/30/2016 95.4  78.0 - 100.0 fL Final  . MCH 10/30/2016 30.2  26.0 - 34.0 pg Final  . MCHC 10/30/2016 31.7  30.0 - 36.0 g/dL Final  . RDW 10/30/2016 13.7  11.5 - 15.5 % Final  . Platelets 10/30/2016 304  150 - 400 K/uL Final  . MRSA, PCR 10/30/2016 NEGATIVE  NEGATIVE Final  . Staphylococcus aureus 10/30/2016 NEGATIVE  NEGATIVE Final   Comment:        The Xpert SA Assay (FDA approved for NASAL specimens in patients over 78 years of age), is one component of a comprehensive surveillance program.  Test performance has been validated by [facility] for patients greater than or equal to 79 year old. It is not intended to diagnose infection nor to guide or monitor treatment.   . Glucose-Capillary 10/30/2016 133* 65 - 99 mg/dL Final  . ABO/RH(D) 10/30/2016 B POS   Final  Office Visit on 10/11/2016  Component Date Value Ref Range Status  . Hgb A1c MFr Bld 10/11/2016 6.8* 4.6 - 6.5 % Final   Glycemic Control Guidelines for People with Diabetes:Non Diabetic:  <6%Goal of Therapy: <7%Additional Action Suggested:  >8%     . Sodium 10/11/2016 137  135 - 145 mEq/L Final  . Potassium 10/11/2016 4.3  3.5 - 5.1 mEq/L Final  . Chloride 10/11/2016 100  96 - 112 mEq/L Final  . CO2 10/11/2016 29  19 - 32 mEq/L Final  . Glucose, Bld 10/11/2016 170* 70 - 99 mg/dL Final  . BUN 10/11/2016 24* 6 - 23 mg/dL Final  . Creatinine, Ser 10/11/2016 0.79  0.40 - 1.20 mg/dL Final  . Calcium 10/11/2016 9.4  8.4 - 10.5 mg/dL Final  . GFR 10/11/2016 74.31  >60.00 mL/min Final  . Microalb, Ur 10/11/2016 0.3  0.0 - 1.9 mg/dL Final  . Creatinine,U 10/11/2016 104.7  mg/dL Final  . Microalb Creat Ratio 10/11/2016 0.3  0.0 - 30.0 mg/g Final     X-Rays:Dg Bone Density  Result Date: 10/30/2016 EXAM: DUAL X-RAY ABSORPTIOMETRY (DXA) FOR BONE MINERAL DENSITY IMPRESSION: Referring Physician:  [name] [name] PATIENT: Name: Katherine Best, Katherine Best Patient ID: [id number] Birth Date: December 22, 1933 Height: 61.0 in. Sex: Female Measured: 10/30/2016 Weight: 178.3 lbs. Indications: Advanced Age, Bilateral Ovariectomy (65.51), Caucasian, Estrogen Deficient, Hypothyroid, Hysterectomy, Levothyroxine, Postmenopausal Fractures:  None Treatments: Calcium (E943.0), Estradiol ASSESSMENT: The BMD measured at Femur Neck Left is 1.001 g/cm2 with a T-score of -0.3. This patient is considered NORMAL according to [facility]) criteria. L-3 and L-4 were excluded due to degenerative changes. Per the official positions of the ISCD, it is not possible to quantitatively compare BMD or calculate an [facility] between exams done at different facilities. Site Region Measured Date Measured Age YA T-score BMD Significant CHANGE DualFemur Neck Left 10/30/2016    80.6         -0.3    1.001 g/cm2 AP Spine  L1-L2     10[age]30/2016    80.6         1.7     1.381 g/cm2 World  Health Organization [facility]) criteria for post-menopausal, Caucasian Women: Normal       T-score at or above -1 SD Osteopenia   T-score between -1 and -2.5 SD Osteoporosis T-score at or below -2.5 SD RECOMMENDATION: [place] recommends that FDA-approved medical therapies be considered in postmenopausal women and men age 41 or older with a: 1. Hip or vertebral (clinical or morphometric) fracture. 2. T-score of <-2.5 at the spine or hip. 3. Ten-year fracture probability by FRAX of 3% or greater for hip fracture or 20% or greater for major osteoporotic fracture. All treatment decisions require clinical judgment and consideration of individual patient factors, including patient preferences, co-morbidities, previous drug use, risk factors not captured in the FRAX model (e.g. falls, vitamin D deficiency, increased bone turnover, interval significant decline in bone density) and possible under - or over-estimation of fracture risk by FRAX. All patients should ensure an adequate intake of dietary calcium (1200 mg/d) and vitamin D (800 IU daily) unless contraindicated. FOLLOW-UP: People with diagnosed cases of osteoporosis or at high risk for fracture should have regular bone mineral density tests. For patients eligible for Medicare, routine testing is allowed once every 2 years. The testing frequency can be increased to one year for patients who have rapidly progressing disease, those who are receiving or discontinuing medical therapy to restore bone mass, or have additional risk factors. I have reviewed this report, and agree with the above findings. Mark A. Thornton Papas, M.D. [facility] Electronically Signed   By: Lavonia Dana M.D.   On: 10/30/2016 11:36   Mm Screening Breast Tomo Bilateral  Result Date: 10/30/2016 CLINICAL DATA:  Screening. EXAM: 2D DIGITAL SCREENING BILATERAL MAMMOGRAM WITH CAD AND ADJUNCT TOMO COMPARISON:  Previous exam(s). ACR Breast Density Category b: There are scattered areas of fibroglandular density. FINDINGS: There are no findings suspicious for malignancy. Images were processed with CAD. IMPRESSION: No mammographic evidence of malignancy. A result letter of this screening mammogram will  be mailed directly to the patient. RECOMMENDATION: Screening mammogram in one year. (Code:[id number]-B-01Y) BI-RADS CATEGORY  1: Negative. Electronically Signed   By: Claudie Revering M.D.   On: 10/30/2016 16:19    EKG: Orders placed or performed during the hospital encounter of 07/26/16  . ED EKG  . ED EKG  . EKG 12-Lead  . EKG 12-Lead  . ED EKG  . ED EKG  . EKG     Hospital Course: Katherine Best is a 83 y.o. who was admitted to [facility]. They were brought to the operating room on 11/06/2016 and underwent Procedure(s): UNICOMPARTMENTAL RIGHT KNEE- Medially.  Patient tolerated the procedure well and was later transferred to the recovery room and then to the orthopaedic floor for postoperative care.  They were given PO and IV analgesics for pain control following their surgery.  They were given 24 hours of postoperative antibiotics of  Anti-infectives    Start     Dose/Rate Route Frequency Ordered Stop   11/06/16 1500  ceFAZolin (ANCEF) IVPB 1 g/50 mL premix     1 g 100 mL/hr over 30 Minutes Intravenous Every 6 hours 11/06/16 1315 11/06/16 2230   11/06/16 0629  ceFAZolin (ANCEF) 2-4 GM/100ML-% IVPB    Comments:  Waldron Session   : cabinet override      11/06/16 0629 11/06/16 0922   11/06/16 0626  ceFAZolin (ANCEF) IVPB 2g/100 mL premix     2 g 200 mL/hr over 30 Minutes Intravenous On call to O.R. 11/06/16 [id number] 11/06/16  0922     and started on DVT prophylaxis in the form of Aspirin.   PT and OT were ordered for total joint protocol.  Discharge planning consulted to help with postop disposition and equipment needs.  Patient had a good night on the evening of surgery.  They started to get up OOB with therapy on day one.  Patient was seen in rounds and was ready to go home.   Diet: Cardiac diet and Diabetic diet Activity:WBAT Follow-up:in 2 weeks Disposition - Home Discharged Condition: stable   Discharge Instructions    Call MD / Call 911    Complete by:  As directed    If you  experience chest pain or shortness of breath, CALL 911 and be transported to the hospital emergency room.  If you develope a fever above 101 F, pus (white drainage) or increased drainage or redness at the wound, or calf pain, call your surgeon's office.   Constipation Prevention    Complete by:  As directed    Drink plenty of fluids.  Prune juice may be helpful.  You may use a stool softener, such as Colace (over the counter) 100 mg twice a day.  Use MiraLax (over the counter) for constipation as needed.   Diet - low sodium heart healthy    Complete by:  As directed    Diet Carb Modified    Complete by:  As directed    Discharge instructions    Complete by:  As directed    INSTRUCTIONS AFTER JOINT REPLACEMENT   Remove items at home which could result in a fall. This includes throw rugs or furniture in walking pathways ICE to the affected joint every three hours while awake for 30 minutes at a time, for at least the first 3-5 days, and then as needed for pain and swelling.  Continue to use ice for pain and swelling. You may notice swelling that will progress down to the foot and ankle.  This is normal after surgery.  Elevate your leg when you are not up walking on it.   Continue to use the breathing machine you got in the hospital (incentive spirometer) which will help keep your temperature down.  It is common for your temperature to cycle up and down following surgery, especially at night when you are not up moving around and exerting yourself.  The breathing machine keeps your lungs expanded and your temperature down.   DIET:  As you were doing prior to hospitalization, we recommend a well-balanced diet.  DRESSING / WOUND CARE / SHOWERING  Keep the surgical dressing until follow up.  The dressing is water proof, so you can shower without any extra covering.  IF THE DRESSING FALLS OFF or the wound gets wet inside, change the dressing with sterile gauze.  Please use good hand washing techniques  before changing the dressing.  Do not use any lotions or creams on the incision until instructed by your surgeon.    ACTIVITY  Increase activity slowly as tolerated, but follow the weight bearing instructions below.   No driving for 6 weeks or until further direction given by your physician.  You cannot drive while taking narcotics.  No lifting or carrying greater than 10 lbs. until further directed by your surgeon. Avoid periods of inactivity such as sitting longer than an hour when not asleep. This helps prevent blood clots.  You may return to work once you are authorized by your doctor.     WEIGHT BEARING  Weight bearing as tolerated with assist device (walker, cane, etc) as directed, use it as long as suggested by your surgeon or therapist, typically at least 4-6 weeks.   EXERCISES  Results after joint replacement surgery are often greatly improved when you follow the exercise, range of motion and muscle strengthening exercises prescribed by your doctor. Safety measures are also important to protect the joint from further injury. Any time any of these exercises cause you to have increased pain or swelling, decrease what you are doing until you are comfortable again and then slowly increase them. If you have problems or questions, call your caregiver or physical therapist for advice.   Rehabilitation is important following a joint replacement. After just a few days of immobilization, the muscles of the leg can become weakened and shrink (atrophy).  These exercises are designed to build up the tone and strength of the thigh and leg muscles and to improve motion. Often times heat used for twenty to thirty minutes before working out will loosen up your tissues and help with improving the range of motion but do not use heat for the first two weeks following surgery (sometimes heat can increase post-operative swelling).   These exercises can be done on a training (exercise) mat, on the floor,  on a table or on a bed. Use whatever works the best and is most comfortable for you.    Use music or television while you are exercising so that the exercises are a pleasant break in your day. This will make your life better with the exercises acting as a break in your routine that you can look forward to.   Perform all exercises about fifteen times, three times per day or as directed.  You should exercise both the operative leg and the other leg as well.  Exercises include:   Quad Sets - Tighten up the muscle on the front of the thigh (Quad) and hold for 5-10 seconds.   Straight Leg Raises - With your knee straight (if you were given a brace, keep it on), lift the leg to 60 degrees, hold for 3 seconds, and slowly lower the leg.  Perform this exercise against resistance later as your leg gets stronger.  Leg Slides: Lying on your back, slowly slide your foot toward your buttocks, bending your knee up off the floor (only go as far as is comfortable). Then slowly slide your foot back down until your leg is flat on the floor again.  Angel Wings: Lying on your back spread your legs to the side as far apart as you can without causing discomfort.  Hamstring Strength:  Lying on your back, push your heel against the floor with your leg straight by tightening up the muscles of your buttocks.  Repeat, but this time bend your knee to a comfortable angle, and push your heel against the floor.  You may put a pillow under the heel to make it more comfortable if necessary.   A rehabilitation program following joint replacement surgery can speed recovery and prevent re-injury in the future due to weakened muscles. Contact your doctor or a physical therapist for more information on knee rehabilitation.    CONSTIPATION  Constipation is defined medically as fewer than three stools per week and severe constipation as less than one stool per week.  Even if you have a regular bowel pattern at home, your normal regimen is  likely to be disrupted due to multiple reasons following surgery.  Combination of anesthesia, postoperative  narcotics, change in appetite and fluid intake all can affect your bowels.   YOU MUST use at least one of the following options; they are listed in order of increasing strength to get the job done.  They are all available over the counter, and you may need to use some, POSSIBLY even all of these options:    Drink plenty of fluids (prune juice may be helpful) and high fiber foods Colace 100 mg by mouth twice a day  Senokot for constipation as directed and as needed Dulcolax (bisacodyl), take with full glass of water  Miralax (polyethylene glycol) once or twice a day as needed.  If you have tried all these things and are unable to have a bowel movement in the first 3-4 days after surgery call either your surgeon or your primary doctor.    If you experience loose stools or diarrhea, hold the medications until you stool forms back up.  If your symptoms do not get better within 1 week or if they get worse, check with your doctor.  If you experience "the worst abdominal pain ever" or develop nausea or vomiting, please contact the office immediately for further recommendations for treatment.   ITCHING:  If you experience itching with your medications, try taking only a single pain pill, or even half a pain pill at a time.  You can also use Benadryl over the counter for itching or also to help with sleep.   TED HOSE STOCKINGS:  Use stockings on both legs until for at least 2 weeks or as directed by physician office. They may be removed at night for sleeping.  MEDICATIONS:  See your medication summary on the "After Visit Summary" that nursing will review with you.  You may have some home medications which will be placed on hold until you complete the course of blood thinner medication.  It is important for you to complete the blood thinner medication as prescribed.  PRECAUTIONS:  If you experience  chest pain or shortness of breath - call 911 immediately for transfer to the hospital emergency department.   If you develop a fever greater that 101 F, purulent drainage from wound, increased redness or drainage from wound, foul odor from the wound/dressing, or calf pain - CONTACT YOUR SURGEON.                                                   FOLLOW-UP APPOINTMENTS:  If you do not already have a post-op appointment, please call the office for an appointment to be seen by your surgeon.  Guidelines for how soon to be seen are listed in your "After Visit Summary", but are typically between 1-4 weeks after surgery.   MAKE SURE YOU:  Understand these instructions.  Get help right away if you are not doing well or get worse.    Thank you for letting us be a part of your medical care team.  It is a privilege we respect greatly.  We hope these instructions will help you stay on track for a fast and full recovery!   Increase activity slowly as tolerated    Complete by:  As directed      Allergies as of 11/07/2016      Reactions   Gabapentin Itching   Sulfonamide Derivatives    REACTION: Nausea  Medication List    STOP taking these medications   aspirin 81 MG tablet Replaced by:  aspirin 81 MG chewable tablet   HYDROcodone-acetaminophen 5-325 MG tablet Commonly known as:  NORCO/VICODIN Replaced by:  HYDROcodone-acetaminophen 7.5-325 MG tablet     TAKE these medications   albuterol 108 (90 Base) MCG/ACT inhaler Commonly known as:  PROVENTIL HFA;VENTOLIN HFA Inhale 1 puff into the lungs every 6 (six) hours as needed for wheezing or shortness of breath.   ALPRAZolam 0.25 MG tablet Commonly known as:  XANAX Take 0.25 mg by mouth at bedtime as needed for anxiety.   aspirin 81 MG chewable tablet Chew 1 tablet (81 mg total) by mouth 2 (two) times daily. for 4 weeks post-operative Replaces:  aspirin 81 MG tablet   atorvastatin 20 MG tablet Commonly known as:  LIPITOR Take 20 mg  by mouth every evening.   CALCIUM 600 PO Take 1 tablet by mouth daily.   docusate sodium 100 MG capsule Commonly known as:  COLACE Take 1 capsule (100 mg total) by mouth 2 (two) times daily.   DULoxetine 60 MG capsule Commonly known as:  CYMBALTA TAKE ONE CAPSULE BY MOUTH DAILY   estradiol 1 MG tablet Commonly known as:  ESTRACE Take 1 tablet (1 mg total) by mouth daily.   ferrous sulfate 325 (65 FE) MG tablet Take 1 tablet (325 mg total) by mouth 2 (two) times daily with a meal. What changed:  when to take this   glucose blood test strip Use to check blood sugar once daily   HYDROcodone-acetaminophen 7.5-325 MG tablet Commonly known as:  NORCO Take 1-2 tablets by mouth every 6 (six) hours. Replaces:  HYDROcodone-acetaminophen 5-325 MG tablet   Lancets 30G Misc Use to check blood sugar once daily   levothyroxine 25 MCG tablet Commonly known as:  SYNTHROID, LEVOTHROID TAKE ONE TABLET BY MOUTH DAILY   loperamide 2 MG capsule Commonly known as:  IMODIUM Take 2 mg by mouth as needed for diarrhea or loose stools. Reported on 05/10/2015   loratadine 10 MG tablet Commonly known as:  CLARITIN Take 10 mg by mouth daily as needed for allergies.   losartan-hydrochlorothiazide 50-12.5 MG tablet Commonly known as:  HYZAAR Take 1 tablet by mouth daily.   LYRICA 50 MG capsule Generic drug:  pregabalin TAKE ONE CAPSULE BY MOUTH TWICE DAILY   meloxicam 15 MG tablet Commonly known as:  MOBIC TAKE ONE TABLET BY MOUTH DAILY AS NEEDED FOR PAIN   metFORMIN 500 MG 24 hr tablet Commonly known as:  GLUCOPHAGE-XR Take 1,000 mg by mouth daily with breakfast.   metFORMIN 500 MG 24 hr tablet Commonly known as:  GLUCOPHAGE-XR TAKE TWO TABLETS BY MOUTH DAILY WITH BREAKFAST   metoprolol tartrate 50 MG tablet Commonly known as:  LOPRESSOR Take 1 tablet (50 mg total) by mouth 2 (two) times daily.   mirtazapine 15 MG tablet Commonly known as:  REMERON Take 7.5 mg by mouth at  bedtime.   MULTIVITAMIN ADULT PO Take 1 tablet by mouth daily.   PRESERVISION AREDS 2 PO Take 1 capsule by mouth 2 (two) times daily.   omeprazole 20 MG capsule Commonly known as:  PRILOSEC TAKE ONE CAPSULE BY MOUTH DAILY   pioglitazone 15 MG tablet Commonly known as:  ACTOS Take 1 tablet (15 mg total) by mouth daily.   polyethylene glycol packet Commonly known as:  MIRALAX / GLYCOLAX Take 17 g by mouth daily as needed for mild constipation.   potassium chloride  SA 20 MEQ tablet Commonly known as:  K-DUR,KLOR-CON TAKE ONE TABLET BY MOUTH DAILY      Follow-up Information    Paralee Cancel, MD. Schedule an appointment as soon as possible for a visit in 2 week(s).   Specialty:  Orthopedic Surgery Contact information: [address] [place] [address] [phone]           Signed: Ardeen Jourdain, PA-C Orthopaedic Surgery 11/08/2016, 9:56 AM

## 2016-11-08 NOTE — Anesthesia Postprocedure Evaluation (Signed)
Anesthesia Post Note  Patient: Katherine Best  Procedure(s) Performed: Procedure(s) (LRB): UNICOMPARTMENTAL RIGHT KNEE- Medially (Right)     Patient location during evaluation: PACU Anesthesia Type: MAC Level of consciousness: oriented and awake and alert Pain management: pain level controlled Vital Signs Assessment: post-procedure vital signs reviewed and stable Respiratory status: spontaneous breathing and respiratory function stable Cardiovascular status: blood pressure returned to baseline and stable Postop Assessment: no headache and no backache Anesthetic complications: no    Last Vitals:  Vitals:   11/07/16 1000 11/07/16 1310  BP: 136/61 (!) 156/65  Pulse: 92 94  Resp: 17 17  Temp:  36.8 C  SpO2: 97% 98%    Last Pain:  Vitals:   11/07/16 1504  TempSrc:   PainSc: [place]

## 2016-11-08 NOTE — Anesthesia Procedure Notes (Signed)
Spinal  Start time: 11/06/2016 9:14 AM End time: 11/06/2016 9:19 AM Staffing Anesthesiologist: Candida Peeling RAY Performed: anesthesiologist  Preanesthetic Checklist Completed: patient identified, site marked, surgical consent, pre-op evaluation, timeout performed, IV checked, risks and benefits discussed and monitors and equipment checked Spinal Block Patient position: sitting Prep: Betadine Patient monitoring: heart rate, cardiac monitor, continuous pulse ox and blood pressure Approach: midline Location: L3-4 Injection technique: single-shot Needle Needle type: Quincke  Needle gauge: 22 G Needle length: 9 cm

## 2016-11-10 DIAGNOSIS — M179 Osteoarthritis of knee, unspecified: Secondary | ICD-10-CM | POA: Diagnosis not present

## 2016-11-13 DIAGNOSIS — M179 Osteoarthritis of knee, unspecified: Secondary | ICD-10-CM | POA: Diagnosis not present

## 2016-11-15 DIAGNOSIS — M179 Osteoarthritis of knee, unspecified: Secondary | ICD-10-CM | POA: Diagnosis not present

## 2016-11-17 DIAGNOSIS — M179 Osteoarthritis of knee, unspecified: Secondary | ICD-10-CM | POA: Diagnosis not present

## 2016-11-20 DIAGNOSIS — M179 Osteoarthritis of knee, unspecified: Secondary | ICD-10-CM | POA: Diagnosis not present

## 2016-11-22 DIAGNOSIS — M179 Osteoarthritis of knee, unspecified: Secondary | ICD-10-CM | POA: Diagnosis not present

## 2016-11-22 DIAGNOSIS — Z471 Aftercare following joint replacement surgery: Secondary | ICD-10-CM | POA: Diagnosis not present

## 2016-11-22 DIAGNOSIS — Z96651 Presence of right artificial knee joint: Secondary | ICD-10-CM | POA: Diagnosis not present

## 2016-11-22 DIAGNOSIS — S29012A Strain of muscle and tendon of back wall of thorax, initial encounter: Secondary | ICD-10-CM | POA: Diagnosis not present

## 2016-11-23 ENCOUNTER — Encounter: Payer: Self-pay | Admitting: Family Medicine

## 2016-11-23 DIAGNOSIS — H353221 Exudative age-related macular degeneration, left eye, with active choroidal neovascularization: Secondary | ICD-10-CM | POA: Diagnosis not present

## 2016-11-23 NOTE — Telephone Encounter (Signed)
I recommend taking only 1/2 of a 4 mg tab no more than 2 times per day.   Watch for extra sedation when taken with pain medication.

## 2016-11-24 ENCOUNTER — Encounter: Payer: Self-pay | Admitting: Family Medicine

## 2016-11-24 DIAGNOSIS — M179 Osteoarthritis of knee, unspecified: Secondary | ICD-10-CM | POA: Diagnosis not present

## 2016-11-24 NOTE — Telephone Encounter (Signed)
No, pain medication and muscle relaxers won't affect blood sugar.

## 2016-11-28 ENCOUNTER — Encounter: Payer: Self-pay | Admitting: Family Medicine

## 2016-11-28 DIAGNOSIS — M179 Osteoarthritis of knee, unspecified: Secondary | ICD-10-CM | POA: Diagnosis not present

## 2016-11-29 ENCOUNTER — Other Ambulatory Visit: Payer: Self-pay | Admitting: *Deleted

## 2016-11-29 DIAGNOSIS — L84 Corns and callosities: Secondary | ICD-10-CM | POA: Diagnosis not present

## 2016-11-29 DIAGNOSIS — L602 Onychogryphosis: Secondary | ICD-10-CM | POA: Diagnosis not present

## 2016-11-29 DIAGNOSIS — E1351 Other specified diabetes mellitus with diabetic peripheral angiopathy without gangrene: Secondary | ICD-10-CM | POA: Diagnosis not present

## 2016-11-29 MED ORDER — OMEPRAZOLE 20 MG PO CPDR: 20.0000 mg | DELAYED_RELEASE_CAPSULE | Freq: Every day | ORAL | 1 refills | Status: DC

## 2016-11-29 MED ORDER — LEVOTHYROXINE SODIUM 25 MCG PO TABS: 25.0000 ug | ORAL_TABLET | Freq: Every day | ORAL | 1 refills | Status: DC

## 2016-11-29 MED ORDER — PREGABALIN 50 MG PO CAPS: 50.0000 mg | ORAL_CAPSULE | Freq: Two times a day (BID) | ORAL | 3 refills | Status: DC

## 2016-11-29 NOTE — Telephone Encounter (Signed)
Pt changing pharmacies.  [facility].  RF request for Lyrica LOV: 01/11/17 Next ov: 10/11/16  Please advise. Thanks.

## 2016-11-30 DIAGNOSIS — M179 Osteoarthritis of knee, unspecified: Secondary | ICD-10-CM | POA: Diagnosis not present

## 2016-12-04 DIAGNOSIS — M179 Osteoarthritis of knee, unspecified: Secondary | ICD-10-CM | POA: Diagnosis not present

## 2016-12-05 ENCOUNTER — Other Ambulatory Visit: Payer: Self-pay | Admitting: *Deleted

## 2016-12-05 MED ORDER — MIRTAZAPINE 15 MG PO TABS: 7.5000 mg | ORAL_TABLET | Freq: Every day | ORAL | 1 refills | Status: DC

## 2016-12-05 NOTE — Telephone Encounter (Signed)
[facility].  RF request for Mirtazapine LOV: 10/11/16 Next ov: 01/11/17 Last written: 09/27/15 #90 w/ 3RF

## 2016-12-07 DIAGNOSIS — M179 Osteoarthritis of knee, unspecified: Secondary | ICD-10-CM | POA: Diagnosis not present

## 2016-12-11 DIAGNOSIS — M179 Osteoarthritis of knee, unspecified: Secondary | ICD-10-CM | POA: Diagnosis not present

## 2016-12-14 DIAGNOSIS — M179 Osteoarthritis of knee, unspecified: Secondary | ICD-10-CM | POA: Diagnosis not present

## 2016-12-18 DIAGNOSIS — M179 Osteoarthritis of knee, unspecified: Secondary | ICD-10-CM | POA: Diagnosis not present

## 2016-12-21 DIAGNOSIS — M179 Osteoarthritis of knee, unspecified: Secondary | ICD-10-CM | POA: Diagnosis not present

## 2016-12-21 DIAGNOSIS — H353221 Exudative age-related macular degeneration, left eye, with active choroidal neovascularization: Secondary | ICD-10-CM | POA: Diagnosis not present

## 2016-12-22 DIAGNOSIS — Z471 Aftercare following joint replacement surgery: Secondary | ICD-10-CM | POA: Diagnosis not present

## 2016-12-22 DIAGNOSIS — Z96651 Presence of right artificial knee joint: Secondary | ICD-10-CM | POA: Diagnosis not present

## 2017-01-09 DIAGNOSIS — M47816 Spondylosis without myelopathy or radiculopathy, lumbar region: Secondary | ICD-10-CM | POA: Diagnosis not present

## 2017-01-11 ENCOUNTER — Encounter: Payer: Self-pay | Admitting: Family Medicine

## 2017-01-11 ENCOUNTER — Ambulatory Visit (INDEPENDENT_AMBULATORY_CARE_PROVIDER_SITE_OTHER): Payer: Medicare Other | Admitting: Family Medicine

## 2017-01-11 VITALS — BP 124/66 | HR 67 | Temp 97.6°F | Resp 16 | Ht 62.0 in | Wt 190.5 lb

## 2017-01-11 DIAGNOSIS — Z23 Encounter for immunization: Secondary | ICD-10-CM | POA: Diagnosis not present

## 2017-01-11 DIAGNOSIS — D508 Other iron deficiency anemias: Secondary | ICD-10-CM

## 2017-01-11 DIAGNOSIS — I25810 Atherosclerosis of coronary artery bypass graft(s) without angina pectoris: Secondary | ICD-10-CM | POA: Diagnosis not present

## 2017-01-11 DIAGNOSIS — E118 Type 2 diabetes mellitus with unspecified complications: Secondary | ICD-10-CM

## 2017-01-11 DIAGNOSIS — D649 Anemia, unspecified: Secondary | ICD-10-CM

## 2017-01-11 DIAGNOSIS — I10 Essential (primary) hypertension: Secondary | ICD-10-CM | POA: Diagnosis not present

## 2017-01-11 DIAGNOSIS — Z794 Long term (current) use of insulin: Secondary | ICD-10-CM | POA: Diagnosis not present

## 2017-01-11 LAB — BASIC METABOLIC PANEL
BUN: 28 mg/dL — AB (ref 6–23)
CHLORIDE: 101 meq/L (ref 96–112)
CO2: 29 mEq/L (ref 19–32)
Calcium: 9.4 mg/dL (ref 8.4–10.5)
Creatinine, Ser: 0.9 mg/dL (ref 0.40–1.20)
GFR: 63.89 mL/min (ref >60.00)
Glucose, Bld: 81 mg/dL (ref 70–99)
Potassium: 4.1 mEq/L (ref 3.5–5.1)
Sodium: 139 mEq/L (ref 135–145)

## 2017-01-11 LAB — CBC WITH DIFFERENTIAL/PLATELET
BASOS ABS: 0 10*3/uL (ref 0.0–0.1)
Basophils Relative: 0.3 % (ref 0.0–3.0)
EOS ABS: 0.1 10*3/uL (ref 0.0–0.7)
Eosinophils Relative: 0.9 % (ref 0.0–5.0)
HCT: 35.9 % — ABNORMAL LOW (ref 36.0–46.0)
Hemoglobin: 11.7 g/dL — ABNORMAL LOW (ref 12.0–15.0)
LYMPHS ABS: 2.8 10*3/uL (ref 0.7–4.0)
Lymphocytes Relative: 25.5 % (ref 12.0–46.0)
MCHC: 32.5 g/dL (ref 30.0–36.0)
MCV: 95.7 fl (ref 78.0–100.0)
MONOS PCT: 10.6 % (ref 3.0–12.0)
Monocytes Absolute: 1.2 10*3/uL — ABNORMAL HIGH (ref 0.1–1.0)
NEUTROS PCT: 62.7 % (ref 43.0–77.0)
Neutro Abs: 7 10*3/uL (ref 1.4–7.7)
Platelets: 340 10*3/uL (ref 150.0–400.0)
RBC: 3.76 Mil/uL — AB (ref 3.87–5.11)
RDW: 14.9 % (ref 11.5–15.5)
WBC: 11.2 10*3/uL — ABNORMAL HIGH (ref 4.0–10.5)

## 2017-01-11 LAB — IRON: IRON: 78 ug/dL (ref 42–145)

## 2017-01-11 LAB — HEMOGLOBIN A1C: HEMOGLOBIN A1C: 6.4 % (ref 4.6–6.5)

## 2017-01-11 LAB — FERRITIN: FERRITIN: 21.8 ng/mL (ref 10.0–291.0)

## 2017-01-11 NOTE — Progress Notes (Signed)
OFFICE VISIT  01/11/2017   CC:  Chief Complaint  Patient presents with  . Follow-up    RCI, pt is not fasting.    HPI:    Patient is a 83 y.o. Caucasian female who presents for 4 mo f/u DM 2, HTN, normocytic anemia.  Norm anemia, iron stores low--suspected last visit that this was due to frequent phlebotomy (see last note A/p). She has been on otc iron bid (she doesn't know the name of it).  Says ferrous sulfate caused diarrhea.  DM: 121 this morning, however it has avg'd 150 fasting before this.  No other glucoses checked. No change in diet, but activity level is down due to a bad back, recently got an injection by Dr. Nelva Bush. Takes tizanadine and vicodin prn.  HTN: normal at home checks consistently, also at MD visits.  Past Medical History:  Diagnosis Date  . Abdominal pain, right lower quadrant   . Bursitis   . Cataracts, both eyes    soon to have cataract surg as of 03/2015  . 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 Diverticulosis of colon    noted on colonoscopy 2008  . Dyspnea    a. 09/2011 CTA Chest: No PE, Ca2+ cors;  b. 09/2011 R&L heart Cath: Relatively nl R heart pressures, nl co/ci, nonobs cad, nl EF.  12/2015 stress test normal, echo with grd II DD--likely explains DOE.  Marland Kitchen External hemorrhoid   . Fatigue    w/ ? excessive daytime somnolence; ? OSA--eval by Dr. Halford Chessman 06/2016, home sleep study ordered.  . Former smoker    30 pack-yrs, quit 2005  . Heart murmur    hx of  . Hot flashes    restarted estradiol 01/2015  . Hx of adenomatous polyp of colon 08/25/2016   07/2016 no recall  . Hyperlipidemia   . Hypertension   . Hypothyroidism   . IBS (irritable bowel syndrome)    IBS-D (Dr. Carlean Purl) + ? worsened by GI side effect of metformin  . Interstitial cystitis   . Liver function study, abnormal   . Lumbar degenerative disc disease    Facet-mediated pain per Dr. Nelva Bush.  Has gotten repeated LB injections, most recently April 2016 in [place].  .  Macular degeneration    left eye  . Menopausal syndrome   . Morton's neuroma   . Obesity, unspecified   . OSA on CPAP 07/2016   HST 07/27/16 >> AHI 6.2, SaO2 low 79%.  CPAP recommended by pulm  . Other specified gastritis without mention of hemorrhage    hx of antral gastritis on EGD 05/2009  . Peripheral neuropathy    per EMG 11/2012.  Intolerant of gabapentin, failed pamelor.  Radiofrequency neurotomy 12/11/14 helped LB pain (Dr. Nelva Bush)  . Pneumonia   . Recurrent UTI    on daily antibiotic prophylaxis by her urologist.  . RLS (restless legs syndrome)   . Sleep apnea   . Tarsal tunnel syndrome   . Unspecified venous (peripheral) insufficiency     Past Surgical History:  Procedure Laterality Date  . ANKLE SURGERY     bilateral  . CARDIAC CATHETERIZATION  09/2011   Nonobstructive CAD  . CARDIOVASCULAR STRESS TEST  01/04/2016   Nuclear medicine stress test: NORMAL  . CARDIOVASCULAR STRESS TEST  01/04/2016   Normal stress nuclear study with no ischemia or infarction; EF 82 with normal wall motion  . CATARACT EXTRACTION    . COLONOSCOPY  2002/2005, 11/14/06,  and 05/2009   polyps 2002 but none 2005 or 2008 or 2011.  Repeat 08/17/16 for diarrhea: one diminutive adenoma + small polyps removed, diverticulosis noted--no further colonoscopies needed (Dr. Carlean Purl).  Marland Kitchen DEXA  10/2016   NORMAL--consider repeat 2 yrs.  . ESOPHAGOGASTRODUODENOSCOPY  05/2009   Moderate antral gastritis  . FOOT SURGERY     R foot bunionectomy and arthroplasty of digits R foot.  Arthroplasty digits 3 and 4 L foot.  Marland Kitchen HAMMER TOE SURGERY     bilateral  . left tarsal tunnel release     sept '11 (Dr Beola Cord)  . PARTIAL KNEE ARTHROPLASTY Right 11/06/2016   Procedure: UNICOMPARTMENTAL RIGHT KNEE- Medially;  Surgeon: Paralee Cancel, MD;  Location: [facility];  Service: Orthopedics;  Laterality: Right;  90 mins  . radiofrequency neurotomy  06/18/2015   bilat L3-4 medial branch nerve and bilat L5 dorsal ramus nerve.(Dr. Nelva Bush)  .  TONSILLECTOMY    . TOTAL ABDOMINAL HYSTERECTOMY W/ BILATERAL SALPINGOOPHORECTOMY    . TRANSTHORACIC ECHOCARDIOGRAM  08/2011; 2017   2013 Grade I DD; 2017 EF 60-65%, normal wall motion, grd II DD.  . unicompartmental right knee     11/06/16 Dr. Alvan Dame    Outpatient Medications Prior to Visit  Medication Sig Dispense Refill  . albuterol (PROVENTIL HFA;VENTOLIN HFA) 108 (90 Base) MCG/ACT inhaler Inhale 1 puff into the lungs every 6 (six) hours as needed for wheezing or shortness of breath. 6.7 g 0  . ALPRAZolam (XANAX) 0.25 MG tablet Take 0.25 mg by mouth at bedtime as needed for anxiety.    Marland Kitchen aspirin 81 MG chewable tablet Chew 1 tablet (81 mg total) by mouth 2 (two) times daily. for 4 weeks post-operative 60 tablet 0  . atorvastatin (LIPITOR) 20 MG tablet Take 20 mg by mouth every evening.  3  . Calcium Carbonate (CALCIUM 600 PO) Take 1 tablet by mouth daily.    Marland Kitchen docusate sodium (COLACE) 100 MG capsule Take 1 capsule (100 mg total) by mouth 2 (two) times daily. 30 capsule 0  . DULoxetine (CYMBALTA) 60 MG capsule TAKE ONE CAPSULE BY MOUTH DAILY 90 capsule 3  . estradiol (ESTRACE) 1 MG tablet Take 1 tablet (1 mg total) by mouth daily. 90 tablet 3  . ferrous sulfate 325 (65 FE) MG tablet Take 1 tablet (325 mg total) by mouth 2 (two) times daily with a meal. 30 tablet 0  . glucose blood test strip Use to check blood sugar once daily 100 each 11  . HYDROcodone-acetaminophen (NORCO) 7.5-325 MG tablet Take 1-2 tablets by mouth every 6 (six) hours. 60 tablet 0  . Lancets 30G MISC Use to check blood sugar once daily 100 each 11  . levothyroxine (SYNTHROID, LEVOTHROID) 25 MCG tablet Take 1 tablet (25 mcg total) by mouth daily. 90 tablet 1  . loperamide (IMODIUM) 2 MG capsule Take 2 mg by mouth as needed for diarrhea or loose stools. Reported on 05/10/2015    . loratadine (CLARITIN) 10 MG tablet Take 10 mg by mouth daily as needed for allergies.    Marland Kitchen losartan-hydrochlorothiazide (HYZAAR) 50-12.5 MG tablet  Take 1 tablet by mouth daily. 90 tablet 3  . meloxicam (MOBIC) 15 MG tablet TAKE ONE TABLET BY MOUTH DAILY AS NEEDED FOR PAIN 90 tablet 1  . metFORMIN (GLUCOPHAGE-XR) 500 MG 24 hr tablet TAKE TWO TABLETS BY MOUTH DAILY WITH BREAKFAST 90 tablet 1  . metoprolol (LOPRESSOR) 50 MG tablet Take 1 tablet (50 mg total) by mouth 2 (two) times daily.  180 tablet 3  . mirtazapine (REMERON) 15 MG tablet Take 0.5 tablets (7.5 mg total) by mouth at bedtime. 90 tablet 1  . Multiple Vitamins-Minerals (MULTIVITAMIN ADULT PO) Take 1 tablet by mouth daily.     . Multiple Vitamins-Minerals (PRESERVISION AREDS 2 PO) Take 1 capsule by mouth 2 (two) times daily.    Marland Kitchen omeprazole (PRILOSEC) 20 MG capsule Take 1 capsule (20 mg total) by mouth daily. 90 capsule 1  . pioglitazone (ACTOS) 15 MG tablet Take 1 tablet (15 mg total) by mouth daily. 90 tablet 1  . potassium chloride SA (K-DUR,KLOR-CON) 20 MEQ tablet TAKE ONE TABLET BY MOUTH DAILY 90 tablet 3  . pregabalin (LYRICA) 50 MG capsule Take 1 capsule (50 mg total) by mouth 2 (two) times daily. 180 capsule 3  . metFORMIN (GLUCOPHAGE-XR) 500 MG 24 hr tablet Take 1,000 mg by mouth daily with breakfast.    . polyethylene glycol (MIRALAX / GLYCOLAX) packet Take 17 g by mouth daily as needed for mild constipation. (Patient not taking: Reported on 01/11/2017) 14 each 0   No facility-administered medications prior to visit.     Allergies  Allergen Reactions  . Gabapentin Itching  . Sulfonamide Derivatives     REACTION: Nausea    ROS As per HPI  PE: Blood pressure 124/66, pulse 67, temperature 97.6 F (36.4 C), temperature source Oral, resp. rate 16, height 5\' 2"  (1.575 m), weight 190 lb 8 oz (86.4 kg), SpO2 100 %. Gen: Alert, well appearing.  Patient is oriented to person, place, time, and situation. AFFECT: pleasant, lucid thought and speech. No further exam today.  LABS:  Lab Results  Component Value Date   TSH 1.74 07/10/2016   Lab Results  Component Value  Date   WBC 11.2 (H) 01/11/2017   HGB 11.7 (L) 01/11/2017   HCT 35.9 (L) 01/11/2017   MCV 95.7 01/11/2017   PLT 340.0 01/11/2017   Lab Results  Component Value Date   IRON 78 01/11/2017   FERRITIN 21.8 01/11/2017    Lab Results  Component Value Date   CREATININE 0.90 01/11/2017   BUN 28 (H) 01/11/2017   NA 139 01/11/2017   K 4.1 01/11/2017   CL 101 01/11/2017   CO2 29 01/11/2017   Lab Results  Component Value Date   ALT 22 03/09/2016   AST 16 03/09/2016   ALKPHOS 81 03/09/2016   BILITOT 0.4 03/09/2016   Lab Results  Component Value Date   CHOL 147 12/27/2015   Lab Results  Component Value Date   HDL 45 (L) 12/27/2015   Lab Results  Component Value Date   LDLCALC 46 12/27/2015   Lab Results  Component Value Date   TRIG 282 (H) 12/27/2015   Lab Results  Component Value Date   CHOLHDL 3.3 12/27/2015   Lab Results  Component Value Date   HGBA1C 6.4 01/11/2017    IMPRESSION AND PLAN:  1) DM 2: stable. HbA1c today. BMET today.  2) HTN: The current medical regimen is effective;  continue present plan and medications. Lytes/cr today.  3) Normocytic anemia; continue otc iron. Recheck CBC and iron panel today. If not improving, will need to check hemoccults and likely get her to GI.  An After Visit Summary was printed and given to the patient.  FOLLOW UP: Return in about 3 months (around 04/13/2017) for routine chronic illness f/u.  Signed:  Crissie Sickles, MD           01/11/2017

## 2017-01-12 LAB — IRON AND TIBC
IRON SATURATION: 20 % (ref 15–55)
Iron: 82 ug/dL (ref 27–139)
TIBC: 403 ug/dL (ref 250–450)
UIBC: 321 ug/dL (ref 118–369)

## 2017-01-25 DIAGNOSIS — M47816 Spondylosis without myelopathy or radiculopathy, lumbar region: Secondary | ICD-10-CM | POA: Diagnosis not present

## 2017-01-25 DIAGNOSIS — H353222 Exudative age-related macular degeneration, left eye, with inactive choroidal neovascularization: Secondary | ICD-10-CM | POA: Diagnosis not present

## 2017-02-07 DIAGNOSIS — E1351 Other specified diabetes mellitus with diabetic peripheral angiopathy without gangrene: Secondary | ICD-10-CM | POA: Diagnosis not present

## 2017-02-07 DIAGNOSIS — L84 Corns and callosities: Secondary | ICD-10-CM | POA: Diagnosis not present

## 2017-02-07 DIAGNOSIS — M79671 Pain in right foot: Secondary | ICD-10-CM | POA: Diagnosis not present

## 2017-02-07 DIAGNOSIS — M79672 Pain in left foot: Secondary | ICD-10-CM | POA: Diagnosis not present

## 2017-02-07 DIAGNOSIS — L602 Onychogryphosis: Secondary | ICD-10-CM | POA: Diagnosis not present

## 2017-02-19 ENCOUNTER — Other Ambulatory Visit: Payer: Self-pay | Admitting: *Deleted

## 2017-02-19 MED ORDER — ESTRADIOL 1 MG PO TABS: 1.0000 mg | ORAL_TABLET | Freq: Every day | ORAL | 3 refills | Status: DC

## 2017-02-19 NOTE — Telephone Encounter (Signed)
[facility].  RF request for estradiol LOV: 01/11/17 Next ov: 04/13/17 Last written: 03/09/16 #90 w/ 3RF  Please advise. Thanks.

## 2017-02-22 ENCOUNTER — Encounter: Payer: Self-pay | Admitting: Internal Medicine

## 2017-02-22 ENCOUNTER — Ambulatory Visit (INDEPENDENT_AMBULATORY_CARE_PROVIDER_SITE_OTHER): Payer: Medicare Other | Admitting: Internal Medicine

## 2017-02-22 VITALS — BP 138/78 | HR 68 | Ht 62.0 in | Wt 189.8 lb

## 2017-02-22 DIAGNOSIS — I2510 Atherosclerotic heart disease of native coronary artery without angina pectoris: Secondary | ICD-10-CM

## 2017-02-22 DIAGNOSIS — E782 Mixed hyperlipidemia: Secondary | ICD-10-CM

## 2017-02-22 DIAGNOSIS — I25810 Atherosclerosis of coronary artery bypass graft(s) without angina pectoris: Secondary | ICD-10-CM | POA: Diagnosis not present

## 2017-02-22 DIAGNOSIS — I519 Heart disease, unspecified: Secondary | ICD-10-CM

## 2017-02-22 MED ORDER — FUROSEMIDE 40 MG PO TABS: ORAL_TABLET | ORAL | 6 refills | Status: DC

## 2017-02-22 NOTE — Patient Instructions (Signed)
Your physician has recommended you make the following change in your medication:  1.) furosemide (Lasix) 40 mg take one tablet as needed for swelling.  Please call if you need on a regular basis.    2.) on the days you take lasix, take an extra 1/2 tablet of potassium chloride  Your physician wants you to follow-up in: July, 2019 with Dr. Harrington Challenger.  You will receive a reminder letter in the mail two months in advance. If you don't receive a letter, please call our office to schedule the follow-up appointment.

## 2017-02-22 NOTE — Progress Notes (Signed)
Cardiology Office Note   Date:  02/22/2017   ID:  Katherine Best, DOB 12-Jun-1933, MRN [id number]  PCP:  Tammi Sou, MD  Cardiologist:   Dorris Carnes, MD    Pt presents for f/u of CAD and SOB     History of Present Illness: Katherine Best is a 83 y.o. female with a history of mld CAD by cath  Normal R heart prssures  Mild diastolic dysfuncton  Also a history of DM  I saw her back in the spring 2018   In the past  I recomm an echo and lexiscan myovue  Myovue ws normal  Echo with normal systolic func  Gr II diastolic dysfunction   Recomm close f/u of BP    Lipdis in the fall LDL was 46  HDL was 45  Since I saw her her breathing has been OK  Deneis CP  Does have some LE edema  Current Meds  Medication Sig  . albuterol (PROVENTIL HFA;VENTOLIN HFA) 108 (90 Base) MCG/ACT inhaler Inhale 1 puff into the lungs every 6 (six) hours as needed for wheezing or shortness of breath.  . ALPRAZolam (XANAX) 0.25 MG tablet Take 0.25 mg by mouth at bedtime as needed for anxiety.  Marland Kitchen aspirin 81 MG chewable tablet Chew 1 tablet (81 mg total) by mouth 2 (two) times daily. for 4 weeks post-operative  . atorvastatin (LIPITOR) 20 MG tablet Take 20 mg by mouth every evening.  . Calcium Carbonate (CALCIUM 600 PO) Take 1 tablet by mouth daily.  . DULoxetine (CYMBALTA) 60 MG capsule TAKE ONE CAPSULE BY MOUTH DAILY  . estradiol (ESTRACE) 1 MG tablet Take 1 tablet (1 mg total) by mouth daily.  . ferrous sulfate 325 (65 FE) MG tablet Take 1 tablet (325 mg total) by mouth 2 (two) times daily with a meal.  . glucose blood test strip Use to check blood sugar once daily  . HYDROcodone-acetaminophen (NORCO) 7.5-325 MG tablet Take 1-2 tablets by mouth every 6 (six) hours.  . Lancets 30G MISC Use to check blood sugar once daily  . levothyroxine (SYNTHROID, LEVOTHROID) 25 MCG tablet Take 1 tablet (25 mcg total) by mouth daily.  Marland Kitchen loperamide (IMODIUM) 2 MG capsule Take 2 mg by mouth as needed for diarrhea or loose  stools. Reported on 05/10/2015  . loratadine (CLARITIN) 10 MG tablet Take 10 mg by mouth daily as needed for allergies.  Marland Kitchen losartan-hydrochlorothiazide (HYZAAR) 50-12.5 MG tablet Take 1 tablet by mouth daily.  . meloxicam (MOBIC) 15 MG tablet TAKE ONE TABLET BY MOUTH DAILY AS NEEDED FOR PAIN  . metFORMIN (GLUCOPHAGE-XR) 500 MG 24 hr tablet TAKE TWO TABLETS BY MOUTH DAILY WITH BREAKFAST  . metoprolol (LOPRESSOR) 50 MG tablet Take 1 tablet (50 mg total) by mouth 2 (two) times daily.  . mirtazapine (REMERON) 15 MG tablet Take 0.5 tablets (7.5 mg total) by mouth at bedtime.  . Multiple Vitamins-Minerals (MULTIVITAMIN ADULT PO) Take 1 tablet by mouth daily.   . Multiple Vitamins-Minerals (PRESERVISION AREDS 2 PO) Take 1 capsule by mouth 2 (two) times daily.  Marland Kitchen omeprazole (PRILOSEC) 20 MG capsule Take 1 capsule (20 mg total) by mouth daily.  Marland Kitchen oxybutynin (DITROPAN-XL) 10 MG 24 hr tablet Take 1 tablet by mouth daily.  . pioglitazone (ACTOS) 15 MG tablet Take 1 tablet (15 mg total) by mouth daily.  . potassium chloride SA (K-DUR,KLOR-CON) 20 MEQ tablet TAKE ONE TABLET BY MOUTH DAILY  . pregabalin (LYRICA) 50 MG capsule Take 1  capsule (50 mg total) by mouth 2 (two) times daily.  Marland Kitchen tiZANidine (ZANAFLEX) 4 MG tablet Take 1 tablet by mouth every 8 (eight) hours as needed.     Allergies:   Gabapentin and Sulfonamide derivatives   Past Medical History:  Diagnosis Date  . Abdominal pain, right lower quadrant   . Bursitis   . Cataracts, both eyes    soon to have cataract surg as of 03/2015  . 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 Diverticulosis of colon    noted on colonoscopy 2008  . Dyspnea    a. 09/2011 CTA Chest: No PE, Ca2+ cors;  b. 09/2011 R&L heart Cath: Relatively nl R heart pressures, nl co/ci, nonobs cad, nl EF.  12/2015 stress test normal, echo with grd II DD--likely explains DOE.  Marland Kitchen External hemorrhoid   . Fatigue    w/ ? excessive daytime somnolence; ? OSA--eval by  Dr. Halford Chessman 06/2016, home sleep study ordered.  . Former smoker    30 pack-yrs, quit 2005  . Heart murmur    hx of  . Hot flashes    restarted estradiol 01/2015  . Hx of adenomatous polyp of colon 08/25/2016   07/2016 no recall  . Hyperlipidemia   . Hypertension   . Hypothyroidism   . IBS (irritable bowel syndrome)    IBS-D (Dr. Carlean Purl) + ? worsened by GI side effect of metformin  . Interstitial cystitis   . Liver function study, abnormal   . Lumbar degenerative disc disease    Facet-mediated pain per Dr. Nelva Bush.  Has gotten repeated LB injections, most recently April 2016 in [place].  . Macular degeneration    left eye  . Menopausal syndrome   . Morton's neuroma   . Obesity, unspecified   . OSA on CPAP 07/2016   HST 07/27/16 >> AHI 6.2, SaO2 low 79%.  CPAP recommended by pulm  . Other specified gastritis without mention of hemorrhage    hx of antral gastritis on EGD 05/2009  . Peripheral neuropathy    per EMG 11/2012.  Intolerant of gabapentin, failed pamelor.  Radiofrequency neurotomy 12/11/14 helped LB pain (Dr. Nelva Bush)  . Pneumonia   . Recurrent UTI    on daily antibiotic prophylaxis by her urologist.  . RLS (restless legs syndrome)   . Sleep apnea   . Tarsal tunnel syndrome   . Unspecified venous (peripheral) insufficiency     Past Surgical History:  Procedure Laterality Date  . ANKLE SURGERY     bilateral  . CARDIAC CATHETERIZATION  09/2011   Nonobstructive CAD  . CARDIOVASCULAR STRESS TEST  01/04/2016   Nuclear medicine stress test: NORMAL  . CARDIOVASCULAR STRESS TEST  01/04/2016   Normal stress nuclear study with no ischemia or infarction; EF 82 with normal wall motion  . CATARACT EXTRACTION    . COLONOSCOPY  2002/2005, 11/14/06, and 05/2009   polyps 2002 but none 2005 or 2008 or 2011.  Repeat 08/17/16 for diarrhea: one diminutive adenoma + small polyps removed, diverticulosis noted--no further colonoscopies needed (Dr. Carlean Purl).  Marland Kitchen DEXA  10/2016   NORMAL--consider repeat 2  yrs.  . ESOPHAGOGASTRODUODENOSCOPY  05/2009   Moderate antral gastritis  . FOOT SURGERY     R foot bunionectomy and arthroplasty of digits R foot.  Arthroplasty digits 3 and 4 L foot.  Marland Kitchen HAMMER TOE SURGERY     bilateral  . left tarsal tunnel release     sept '11 (Dr Beola Cord)  .  PARTIAL KNEE ARTHROPLASTY Right 11/06/2016   Procedure: UNICOMPARTMENTAL RIGHT KNEE- Medially;  Surgeon: Paralee Cancel, MD;  Location: [facility];  Service: Orthopedics;  Laterality: Right;  90 mins  . radiofrequency neurotomy  06/18/2015   bilat L3-4 medial branch nerve and bilat L5 dorsal ramus nerve.(Dr. Nelva Bush)  . TONSILLECTOMY    . TOTAL ABDOMINAL HYSTERECTOMY W/ BILATERAL SALPINGOOPHORECTOMY    . TRANSTHORACIC ECHOCARDIOGRAM  08/2011; 2017   2013 Grade I DD; 2017 EF 60-65%, normal wall motion, grd II DD.  . unicompartmental right knee     11/06/16 Dr. Alvan Dame     Social History:  The patient  reports that she quit smoking about 14 years ago. Her smoking use included cigarettes. She has a 35.00 pack-year smoking history. she has never used smokeless tobacco. She reports that she does not drink alcohol or use drugs.   Family History:  The patient's family history includes Breast cancer in her mother; Cancer in her mother; Diabetes in her maternal grandfather; Hyperlipidemia in her father; Hypertension in her father.    ROS:  Please see the history of present illness. All other systems are reviewed and  Negative to the above problem except as noted.    PHYSICAL EXAM: VS:  BP 138/78   Pulse 68   Ht 5\' 2"  (1.575 m)   Wt 189 lb 12.8 oz (86.1 kg)   SpO2 93%   BMI 34.71 kg/m   GEN: Obese 83 yo , in no acute distress  HEENT: normal  Neck: no JVD, carotid bruits, or masses Cardiac: RRR; no murmurs, rubs, or gallops,Trt edema  Respiratory:  clear to auscultation bilaterally, normal work of breathing GI: soft, nontender, nondistended, + BS  No hepatomegaly  MS: no deformi ty Moving all extremities   Skin: warm and  dry,Some erythema to calvs   Neuro:  Strength and sensation are intact Psych: euthymic mood, full affect   EKG:  EKG is not  ordered today.   Lipid Panel    Component Value Date/Time   CHOL 147 12/27/2015 1123   TRIG 282 (H) 12/27/2015 1123   HDL 45 (L) 12/27/2015 1123   CHOLHDL 3.3 12/27/2015 1123   VLDL 56 (H) 12/27/2015 1123   LDLCALC 46 12/27/2015 1123   LDLDIRECT 76.0 01/18/2015 0826      Wt Readings from Last 3 Encounters:  02/22/17 189 lb 12.8 oz (86.1 kg)  01/11/17 190 lb 8 oz (86.4 kg)  11/06/16 180 lb (81.6 kg)      ASSESSMENT AND PLAN:  1  Dyspnea/diastolic dysfunction  Pt denies symptoms  Volume does appear to be up some  I would recomm 20 the 40 mg Lasix to take as needed with 1 1/2 K if she does  May represent some fluid retention if Na up   Should only have to use 1 x per week as neede    2  CAD MIld at cath  Aysmptomatic  3  HL  Keep on statin    Pt followed in Orthor by Dr Nelva Bush  Has seen Creig Hines in the past  Extensive musculoskel complaints limit physcial activity  I would f/u I late spring Current medicines are reviewed at length with the patient today.  The patient does not have concerns regarding medicines.  Signed, Dorris Carnes, MD  02/22/2017 9:52 AM    [address], [place], [place]  [address] Phone: [phone]; Fax: [phone]

## 2017-02-24 DIAGNOSIS — M545 Low back pain: Secondary | ICD-10-CM | POA: Diagnosis not present

## 2017-02-24 DIAGNOSIS — M5136 Other intervertebral disc degeneration, lumbar region: Secondary | ICD-10-CM | POA: Diagnosis not present

## 2017-02-25 ENCOUNTER — Encounter: Payer: Self-pay | Admitting: Family Medicine

## 2017-03-01 DIAGNOSIS — H353222 Exudative age-related macular degeneration, left eye, with inactive choroidal neovascularization: Secondary | ICD-10-CM | POA: Diagnosis not present

## 2017-03-02 ENCOUNTER — Other Ambulatory Visit: Payer: Self-pay | Admitting: *Deleted

## 2017-03-02 ENCOUNTER — Encounter: Payer: Self-pay | Admitting: Family Medicine

## 2017-03-02 MED ORDER — METFORMIN HCL ER 500 MG PO TB24: ORAL_TABLET | ORAL | 1 refills | Status: DC

## 2017-03-07 ENCOUNTER — Other Ambulatory Visit: Payer: Self-pay | Admitting: Family Medicine

## 2017-03-08 ENCOUNTER — Other Ambulatory Visit: Payer: Self-pay | Admitting: Physical Medicine and Rehabilitation

## 2017-03-08 DIAGNOSIS — M5136 Other intervertebral disc degeneration, lumbar region: Secondary | ICD-10-CM

## 2017-03-19 ENCOUNTER — Ambulatory Visit
Admission: RE | Admit: 2017-03-19 | Discharge: 2017-03-19 | Disposition: A | Discharge status: Home / Self Care | Payer: Medicare Other | Source: Ambulatory Visit | Attending: Physical Medicine and Rehabilitation | Admitting: Physical Medicine and Rehabilitation

## 2017-03-19 DIAGNOSIS — M48061 Spinal stenosis, lumbar region without neurogenic claudication: Secondary | ICD-10-CM | POA: Diagnosis not present

## 2017-03-19 DIAGNOSIS — M5136 Other intervertebral disc degeneration, lumbar region: Secondary | ICD-10-CM

## 2017-03-20 ENCOUNTER — Other Ambulatory Visit: Payer: Self-pay | Admitting: Family Medicine

## 2017-03-21 ENCOUNTER — Other Ambulatory Visit: Payer: Self-pay

## 2017-03-21 DIAGNOSIS — I10 Essential (primary) hypertension: Secondary | ICD-10-CM

## 2017-03-21 MED ORDER — LOSARTAN POTASSIUM-HCTZ 50-12.5 MG PO TABS: 1.0000 | ORAL_TABLET | Freq: Every day | ORAL | 3 refills | Status: DC

## 2017-03-21 NOTE — Telephone Encounter (Signed)
[facility].

## 2017-03-22 ENCOUNTER — Other Ambulatory Visit: Payer: Self-pay | Admitting: Family Medicine

## 2017-03-22 DIAGNOSIS — I10 Essential (primary) hypertension: Secondary | ICD-10-CM

## 2017-03-22 MED ORDER — MELOXICAM 15 MG PO TABS: ORAL_TABLET | ORAL | 1 refills | Status: DC

## 2017-03-22 MED ORDER — OMEPRAZOLE 20 MG PO CPDR: 20.0000 mg | DELAYED_RELEASE_CAPSULE | Freq: Every day | ORAL | 1 refills | Status: DC

## 2017-03-22 MED ORDER — POTASSIUM CHLORIDE CRYS ER 20 MEQ PO TBCR: 20.0000 meq | EXTENDED_RELEASE_TABLET | Freq: Every day | ORAL | 3 refills | Status: DC

## 2017-03-22 MED ORDER — PREGABALIN 50 MG PO CAPS: 50.0000 mg | ORAL_CAPSULE | Freq: Two times a day (BID) | ORAL | 3 refills | Status: DC

## 2017-03-22 MED ORDER — DULOXETINE HCL 60 MG PO CPEP: 60.0000 mg | ORAL_CAPSULE | Freq: Every day | ORAL | 1 refills | Status: DC

## 2017-03-22 MED ORDER — METOPROLOL TARTRATE 50 MG PO TABS: ORAL_TABLET | ORAL | 1 refills | Status: DC

## 2017-03-22 MED ORDER — PIOGLITAZONE HCL 15 MG PO TABS: 15.0000 mg | ORAL_TABLET | Freq: Every day | ORAL | 1 refills | Status: DC

## 2017-03-22 MED ORDER — METFORMIN HCL ER 500 MG PO TB24
ORAL_TABLET | ORAL | 1 refills | Status: DC
Start: 2017-03-22 — End: 2017-09-17

## 2017-03-22 MED ORDER — ESTRADIOL 1 MG PO TABS: 1.0000 mg | ORAL_TABLET | Freq: Every day | ORAL | 3 refills | Status: DC

## 2017-03-22 MED ORDER — MIRTAZAPINE 15 MG PO TABS: 7.5000 mg | ORAL_TABLET | Freq: Every day | ORAL | 1 refills | Status: DC

## 2017-03-22 MED ORDER — LEVOTHYROXINE SODIUM 25 MCG PO TABS: 25.0000 ug | ORAL_TABLET | Freq: Every day | ORAL | 1 refills | Status: DC

## 2017-03-22 MED ORDER — FUROSEMIDE 40 MG PO TABS: ORAL_TABLET | ORAL | 1 refills | Status: DC

## 2017-03-22 MED ORDER — LOSARTAN POTASSIUM-HCTZ 50-12.5 MG PO TABS: 1.0000 | ORAL_TABLET | Freq: Every day | ORAL | 1 refills | Status: DC

## 2017-03-22 NOTE — Telephone Encounter (Signed)
Copied from [place]. Topic: General - Other >> Mar 22, 2017 10:23 AM Neva Seat wrote:  Pt has had issue with [facility] when [facility] not [facility] for [facility]ill. Pt wants all Rx's to be filled at [facility], [place] - Phone: [phone] Fax: [phone].

## 2017-03-22 NOTE — Telephone Encounter (Signed)
SW Lattie Haw?) at [facility] and she stated that they have an active Rx for pts atorvastatin but need Rxs for the rest of her medications.   I have sent in refills for pts: duloxetine, furosemide, levothyroxine, losartan/hctz, metormin, metoprolol, mirtazapine, omeprazole, and pioglitazine.  Pt still needs refills for: estradiol, meloxicam, potassium, and pregabalin. Please advise. Thanks.

## 2017-03-22 NOTE — Telephone Encounter (Signed)
Called pt and she stated that she wants to transfer medications transferreed to [facility]; pt verifies she does not need want to pick up any medications at this time; spoke with Evelena Peat at [facility] and she states that prescriptions have not been received for this pt; will route to route to [facility] pool so that new prescriptions from Dr [name] can be sent to [facility].

## 2017-03-23 NOTE — Telephone Encounter (Signed)
Lyrica Rx faxed.

## 2017-03-27 DIAGNOSIS — I119 Hypertensive heart disease without heart failure: Secondary | ICD-10-CM

## 2017-03-27 DIAGNOSIS — M7061 Trochanteric bursitis, right hip: Secondary | ICD-10-CM

## 2017-03-27 DIAGNOSIS — I5032 Chronic diastolic (congestive) heart failure: Secondary | ICD-10-CM

## 2017-03-27 DIAGNOSIS — M159 Polyosteoarthritis, unspecified: Secondary | ICD-10-CM

## 2017-03-27 HISTORY — DX: Chronic diastolic (congestive) heart failure: I50.32

## 2017-03-27 HISTORY — DX: Hypertensive heart disease without heart failure: I11.9

## 2017-03-27 HISTORY — DX: Trochanteric bursitis, right hip: M70.61

## 2017-03-27 HISTORY — DX: Polyosteoarthritis, unspecified: M15.9

## 2017-03-28 DIAGNOSIS — M5136 Other intervertebral disc degeneration, lumbar region: Secondary | ICD-10-CM | POA: Diagnosis not present

## 2017-03-28 DIAGNOSIS — M48061 Spinal stenosis, lumbar region without neurogenic claudication: Secondary | ICD-10-CM | POA: Diagnosis not present

## 2017-03-28 DIAGNOSIS — M4187 Other forms of scoliosis, lumbosacral region: Secondary | ICD-10-CM | POA: Diagnosis not present

## 2017-03-28 DIAGNOSIS — E1142 Type 2 diabetes mellitus with diabetic polyneuropathy: Secondary | ICD-10-CM | POA: Diagnosis not present

## 2017-03-28 DIAGNOSIS — M545 Low back pain: Secondary | ICD-10-CM | POA: Diagnosis not present

## 2017-03-29 ENCOUNTER — Encounter: Payer: Self-pay | Admitting: Family Medicine

## 2017-04-02 ENCOUNTER — Encounter: Payer: Self-pay | Admitting: Family Medicine

## 2017-04-02 DIAGNOSIS — M4696 Unspecified inflammatory spondylopathy, lumbar region: Secondary | ICD-10-CM | POA: Diagnosis not present

## 2017-04-05 DIAGNOSIS — H353222 Exudative age-related macular degeneration, left eye, with inactive choroidal neovascularization: Secondary | ICD-10-CM | POA: Diagnosis not present

## 2017-04-12 DIAGNOSIS — M47816 Spondylosis without myelopathy or radiculopathy, lumbar region: Secondary | ICD-10-CM | POA: Diagnosis not present

## 2017-04-13 ENCOUNTER — Encounter: Payer: Self-pay | Admitting: Family Medicine

## 2017-04-13 ENCOUNTER — Ambulatory Visit: Payer: Self-pay

## 2017-04-13 ENCOUNTER — Ambulatory Visit (INDEPENDENT_AMBULATORY_CARE_PROVIDER_SITE_OTHER): Payer: Medicare Other | Admitting: Family Medicine

## 2017-04-13 VITALS — BP 123/71 | HR 79 | Temp 97.7°F | Resp 16 | Ht 62.0 in | Wt 196.2 lb

## 2017-04-13 DIAGNOSIS — E782 Mixed hyperlipidemia: Secondary | ICD-10-CM | POA: Diagnosis not present

## 2017-04-13 DIAGNOSIS — T50905A Adverse effect of unspecified drugs, medicaments and biological substances, initial encounter: Secondary | ICD-10-CM

## 2017-04-13 DIAGNOSIS — E118 Type 2 diabetes mellitus with unspecified complications: Secondary | ICD-10-CM

## 2017-04-13 DIAGNOSIS — I10 Essential (primary) hypertension: Secondary | ICD-10-CM

## 2017-04-13 DIAGNOSIS — R609 Edema, unspecified: Secondary | ICD-10-CM

## 2017-04-13 NOTE — Progress Notes (Signed)
OFFICE VISIT  04/13/2017  CC:  Chief Complaint  Katherine Best presents with  . Follow-up    RCI, pt is not fasting.   HPI:    Katherine Best is a 84 y.o. Caucasian female who presents for 3 mo f/u DM 2, HTN, HLD.  DM: glucoses avg 150 fasting per pt. She notes increased bilat LE swelling on pioglitazone. She reports she has decreased sensation/numbness in all of her toes--long term.  No feet pain.  No hx of foot ulcer. Goes to podiatrist to get nails trimmed.  HTN: <130/80 consistently.  Compliant with lopressor and hyzaar.  HLD: tolerating atorva 20mg  qd, compliant with this rx.  Doing water aerobics lately.   Tries to eat a healthy diet.  ROS: no CP, no SOB, no dizziness, no focal weakness, no melena or hematochezia.  + fingertips with tingling and numbness longterm.  No HAs, no palpitations.   Past Medical History:  Diagnosis Date  . Abdominal pain, right lower quadrant   . Bursitis   . Cataracts, both eyes    soon to have cataract surg as of 03/2015  . Diabetes mellitus with skin complications (HCC)    DPN  . Diverticulosis of colon    noted on colonoscopy 2008  . Dyspnea    a. 09/2011 CTA Chest: No PE, Ca2+ cors;  b. 09/2011 R&L heart Cath: Relatively nl R heart pressures, nl co/ci, nonobs cad, nl EF.  12/2015 stress test normal, echo with grd II DD--likely explains DOE.  Marland Kitchen External hemorrhoid   . Fatigue    w/ ? excessive daytime somnolence; ? OSA--eval by Dr. Halford Chessman 06/2016, home sleep study ordered.  . Former smoker    30 pack-yrs, quit 2005  . Heart murmur    hx of  . Hot flashes    restarted estradiol 01/2015  . Hx of adenomatous polyp of colon 08/25/2016   07/2016 no recall  . Hyperlipidemia   . Hypertension   . Hypothyroidism   . IBS (irritable bowel syndrome)    IBS-D (Dr. Carlean Purl) + ? worsened by GI side effect of metformin  . Interstitial cystitis   . Liver function study, abnormal   . Lumbar degenerative disc disease    Facet-mediated pain per Dr. Nelva Bush.  Has  gotten repeated LB injections, most recently April 2016 in [place].  Eval by Dr. Tonita Cong 03/2017.  Spinal stenosis without neurogenic claudication.  Arthropathy of lumbar facet jt: possible candidate for radiofrequency rhizotomy as of 03/2017.  . Lumbar scoliosis   . Macular degeneration    left eye  . Menopausal syndrome   . Morton's neuroma   . Obesity, unspecified   . OSA on CPAP 07/2016   HST 07/27/16 >> AHI 6.2, SaO2 low 79%.  CPAP recommended by pulm  . Other specified gastritis without mention of hemorrhage    hx of antral gastritis on EGD 05/2009  . Peripheral neuropathy    ? DPN?--per EMG 11/2012.  Intolerant of gabapentin, failed pamelor.  Radiofrequency neurotomy 12/11/14 helped LB pain (Dr. Nelva Bush)  . Pneumonia   . Recurrent UTI    on daily antibiotic prophylaxis by her urologist.  . RLS (restless legs syndrome)   . Sleep apnea   . Tarsal tunnel syndrome   . Unspecified venous (peripheral) insufficiency     Past Surgical History:  Procedure Laterality Date  . ANKLE SURGERY     bilateral  . CARDIAC CATHETERIZATION  09/2011   Nonobstructive CAD  . CARDIOVASCULAR STRESS TEST  01/04/2016  Nuclear medicine stress test: NORMAL  . CARDIOVASCULAR STRESS TEST  01/04/2016   Normal stress nuclear study with no ischemia or infarction; EF 82 with normal wall motion  . CATARACT EXTRACTION    . COLONOSCOPY  2002/2005, 11/14/06, and 05/2009   polyps 2002 but none 2005 or 2008 or 2011.  Repeat 08/17/16 for diarrhea: one diminutive adenoma + small polyps removed, diverticulosis noted--no further colonoscopies needed (Dr. Carlean Purl).  Marland Kitchen DEXA  10/2016   NORMAL--consider repeat 2 yrs.  . ESOPHAGOGASTRODUODENOSCOPY  05/2009   Moderate antral gastritis  . FOOT SURGERY     R foot bunionectomy and arthroplasty of digits R foot.  Arthroplasty digits 3 and 4 L foot.  Marland Kitchen HAMMER TOE SURGERY     bilateral  . left tarsal tunnel release     sept '11 (Dr Beola Cord)  . PARTIAL KNEE ARTHROPLASTY Right 11/06/2016    Procedure: UNICOMPARTMENTAL RIGHT KNEE- Medially;  Surgeon: Paralee Cancel, MD;  Location: [facility];  Service: Orthopedics;  Laterality: Right;  90 mins  . radiofrequency neurotomy  06/18/2015   bilat L3-4 medial branch nerve and bilat L5 dorsal ramus nerve.(Dr. Nelva Bush)  . TONSILLECTOMY    . TOTAL ABDOMINAL HYSTERECTOMY W/ BILATERAL SALPINGOOPHORECTOMY    . TRANSTHORACIC ECHOCARDIOGRAM  08/2011; 2017   2013 Grade I DD; 2017 EF 60-65%, normal wall motion, grd II DD.  . unicompartmental right knee     11/06/16 Dr. Alvan Dame    Outpatient Medications Prior to Visit  Medication Sig Dispense Refill  . albuterol (PROVENTIL HFA;VENTOLIN HFA) 108 (90 Base) MCG/ACT inhaler Inhale 1 puff into the lungs every 6 (six) hours as needed for wheezing or shortness of breath. 6.7 g 0  . ALPRAZolam (XANAX) 0.25 MG tablet Take 0.25 mg by mouth at bedtime as needed for anxiety.    Marland Kitchen aspirin 81 MG chewable tablet Chew 1 tablet (81 mg total) by mouth 2 (two) times daily. for 4 weeks post-operative 60 tablet 0  . atorvastatin (LIPITOR) 20 MG tablet TAKE 1 TABLET BY MOUTH EVERY DAY 90 tablet 0  . atorvastatin (LIPITOR) 20 MG tablet TAKE ONE TABLET BY MOUTH DAILY 90 tablet 1  . Calcium Carbonate (CALCIUM 600 PO) Take 1 tablet by mouth daily.    . celecoxib (CELEBREX) 200 MG capsule Take 200 mg by mouth daily.  0  . DULoxetine (CYMBALTA) 60 MG capsule Take 1 capsule (60 mg total) by mouth daily. 90 capsule 1  . estradiol (ESTRACE) 1 MG tablet Take 1 tablet (1 mg total) by mouth daily. 90 tablet 3  . ferrous sulfate 325 (65 FE) MG tablet Take 1 tablet (325 mg total) by mouth 2 (two) times daily with a meal. 30 tablet 0  . furosemide (LASIX) 40 MG tablet Take 1 tablet as needed for swelling 90 tablet 1  . glucose blood test strip Use to check blood sugar once daily 100 each 11  . HYDROcodone-acetaminophen (NORCO) 7.5-325 MG tablet Take 1-2 tablets by mouth every 6 (six) hours. 60 tablet 0  . Lancets 30G MISC Use to check blood  sugar once daily 100 each 11  . levothyroxine (SYNTHROID, LEVOTHROID) 25 MCG tablet Take 1 tablet (25 mcg total) by mouth daily. 90 tablet 1  . loperamide (IMODIUM) 2 MG capsule Take 2 mg by mouth as needed for diarrhea or loose stools. Reported on 05/10/2015    . loratadine (CLARITIN) 10 MG tablet Take 10 mg by mouth daily as needed for allergies.    Marland Kitchen losartan-hydrochlorothiazide (HYZAAR) 50-12.5  MG tablet Take 1 tablet by mouth daily. 90 tablet 1  . metFORMIN (GLUCOPHAGE-XR) 500 MG 24 hr tablet TAKE TWO TABLETS BY MOUTH DAILY WITH BREAKFAST 90 tablet 1  . metoprolol tartrate (LOPRESSOR) 50 MG tablet TAKE 1 TABLET BY MOUTH TWICE A DAY 180 tablet 1  . mirtazapine (REMERON) 15 MG tablet Take 0.5 tablets (7.5 mg total) by mouth at bedtime. 90 tablet 1  . Multiple Vitamins-Minerals (MULTIVITAMIN ADULT PO) Take 1 tablet by mouth daily.     . Multiple Vitamins-Minerals (PRESERVISION AREDS 2 PO) Take 1 capsule by mouth 2 (two) times daily.    Marland Kitchen omeprazole (PRILOSEC) 20 MG capsule Take 1 capsule (20 mg total) by mouth daily. 90 capsule 1  . oxybutynin (DITROPAN-XL) 10 MG 24 hr tablet Take 1 tablet by mouth daily.    . pioglitazone (ACTOS) 15 MG tablet Take 1 tablet (15 mg total) by mouth daily. 90 tablet 1  . potassium chloride SA (K-DUR,KLOR-CON) 20 MEQ tablet Take 1 tablet (20 mEq total) by mouth daily. 90 tablet 3  . pregabalin (LYRICA) 50 MG capsule Take 1 capsule (50 mg total) by mouth 2 (two) times daily. 180 capsule 3  . tiZANidine (ZANAFLEX) 4 MG tablet Take 1 tablet by mouth every 8 (eight) hours as needed.  1  . meloxicam (MOBIC) 15 MG tablet TAKE ONE TABLET BY MOUTH DAILY AS NEEDED FOR PAIN 90 tablet 1   No facility-administered medications prior to visit.     Allergies  Allergen Reactions  . Gabapentin Itching  . Sulfonamide Derivatives     REACTION: Nausea    ROS As per HPI  PE: Blood pressure 123/71, pulse 79, temperature 97.7 F (36.5 C), temperature source Oral, resp. rate  16, height 5\' 2"  (1.575 m), weight 196 lb 4 oz (89 kg), SpO2 97 %. Body mass index is 35.89 kg/m.  Gen: Alert, well appearing.  Katherine Best is oriented to person, place, time, and situation. AFFECT: pleasant, lucid thought and speech. Foot exam - no swelling, tenderness or skin or vascular lesions. Color and temperature is normal. No sensation to monofilament testing on plantar surfaces of either foot.  No calluses or skin breakdown.  Peripheral pulses are palpable but diminished. Toenails are pretty thick and mildly long. LL's with 2+ pitting edema bilat.   LABS:  Lab Results  Component Value Date   TSH 1.74 07/10/2016     Chemistry      Component Value Date/Time   NA 139 01/11/2017 1110   K 4.1 01/11/2017 1110   CL 101 01/11/2017 1110   CO2 29 01/11/2017 1110   BUN 28 (H) 01/11/2017 1110   CREATININE 0.90 01/11/2017 1110      Component Value Date/Time   CALCIUM 9.4 01/11/2017 1110   ALKPHOS 81 03/09/2016 1002   AST 16 03/09/2016 1002   ALT 22 03/09/2016 1002   BILITOT 0.4 03/09/2016 1002     Lab Results  Component Value Date   CHOL 147 12/27/2015   HDL 45 (L) 12/27/2015   LDLCALC 46 12/27/2015   LDLDIRECT 76.0 01/18/2015   TRIG 282 (H) 12/27/2015   CHOLHDL 3.3 12/27/2015   Lab Results  Component Value Date   WBC 11.2 (H) 01/11/2017   HGB 11.7 (L) 01/11/2017   HCT 35.9 (L) 01/11/2017   MCV 95.7 01/11/2017   PLT 340.0 01/11/2017   Lab Results  Component Value Date   HGBA1C 6.4 01/11/2017    IMPRESSION AND PLAN:  1) DM 2; too much  LE swelling on actos trial.  She stopped this med. Plan: restart metformin at full dosing that she was on at one point in time (she recalls being d/c'd home from a hospitalization on lower dose but does not recall why): xr 500mg , 2 tabs qAM and 2 tabs qPM like she had been on before. HbA1c today. Feet exam today: DPN plantar surfaces bilat.   Eye exam UTD, urine microalb/cr UTD. Lytes/cr today.  2) HTN: The current medical regimen  is effective;  continue present plan and medications. Lytes/cr today.  3) HLD: tolerating statin.  LDL 46 Oct 2017. She is not fasting today.  An After Visit Summary was printed and given to the Katherine Best.  FOLLOW UP: Return in about 3 months (around 07/12/2017) for routine chronic illness f/u--also needs lab appt for fasting labs at her earliest convenience.  Signed:  Crissie Sickles, MD           04/13/2017

## 2017-04-17 ENCOUNTER — Other Ambulatory Visit (INDEPENDENT_AMBULATORY_CARE_PROVIDER_SITE_OTHER): Payer: Medicare Other

## 2017-04-17 DIAGNOSIS — E118 Type 2 diabetes mellitus with unspecified complications: Secondary | ICD-10-CM | POA: Diagnosis not present

## 2017-04-17 DIAGNOSIS — I10 Essential (primary) hypertension: Secondary | ICD-10-CM | POA: Diagnosis not present

## 2017-04-17 DIAGNOSIS — E782 Mixed hyperlipidemia: Secondary | ICD-10-CM

## 2017-04-18 ENCOUNTER — Encounter: Payer: Self-pay | Admitting: *Deleted

## 2017-04-18 ENCOUNTER — Encounter: Payer: Self-pay | Admitting: Family Medicine

## 2017-04-18 DIAGNOSIS — L602 Onychogryphosis: Secondary | ICD-10-CM | POA: Diagnosis not present

## 2017-04-18 DIAGNOSIS — E1351 Other specified diabetes mellitus with diabetic peripheral angiopathy without gangrene: Secondary | ICD-10-CM | POA: Diagnosis not present

## 2017-04-18 LAB — BASIC METABOLIC PANEL
BUN: 18 mg/dL (ref 7–25)
CHLORIDE: 101 mmol/L (ref 98–110)
CO2: 28 mmol/L (ref 20–32)
CREATININE: 0.72 mg/dL (ref 0.60–0.88)
Calcium: 9.5 mg/dL (ref 8.6–10.4)
GLUCOSE: 117 mg/dL — AB (ref 65–99)
Potassium: 4.2 mmol/L (ref 3.5–5.3)
Sodium: 140 mmol/L (ref 135–146)

## 2017-04-18 LAB — LIPID PANEL
CHOL/HDL RATIO: 3.3 (calc) (ref <5.0)
Cholesterol: 154 mg/dL (ref <200)
HDL: 46 mg/dL — AB (ref >50)
LDL Cholesterol (Calc): 68 mg/dL (calc)
NON-HDL CHOLESTEROL (CALC): 108 mg/dL (ref <130)
Triglycerides: 334 mg/dL — ABNORMAL HIGH (ref <150)

## 2017-04-18 LAB — HEMOGLOBIN A1C
HEMOGLOBIN A1C: 6.5 %{Hb} — AB (ref <5.7)
MEAN PLASMA GLUCOSE: 140 (calc)
eAG (mmol/L): 7.7 (calc)

## 2017-04-22 ENCOUNTER — Encounter: Payer: Self-pay | Admitting: Family Medicine

## 2017-04-23 ENCOUNTER — Encounter: Payer: Self-pay | Admitting: Family Medicine

## 2017-04-23 ENCOUNTER — Other Ambulatory Visit: Payer: Self-pay | Admitting: Family Medicine

## 2017-04-23 DIAGNOSIS — M4696 Unspecified inflammatory spondylopathy, lumbar region: Secondary | ICD-10-CM | POA: Diagnosis not present

## 2017-04-23 MED ORDER — CELECOXIB 200 MG PO CAPS: 200.0000 mg | ORAL_CAPSULE | Freq: Every day | ORAL | 6 refills | Status: DC

## 2017-04-23 NOTE — Telephone Encounter (Signed)
Patient notified and verbalized understanding. 

## 2017-04-23 NOTE — Telephone Encounter (Signed)
I'll do eRx for celebrex, but pt should be aware that there may be insurance coverage problems/cost problems.-thx

## 2017-04-23 NOTE — Progress Notes (Signed)
A user error has taken place: encounter opened in error, closed for administrative reasons.

## 2017-04-23 NOTE — Telephone Encounter (Signed)
Please advise. Thanks.  

## 2017-05-03 DIAGNOSIS — M47816 Spondylosis without myelopathy or radiculopathy, lumbar region: Secondary | ICD-10-CM | POA: Diagnosis not present

## 2017-05-08 DIAGNOSIS — M79672 Pain in left foot: Secondary | ICD-10-CM | POA: Diagnosis not present

## 2017-05-08 DIAGNOSIS — E1151 Type 2 diabetes mellitus with diabetic peripheral angiopathy without gangrene: Secondary | ICD-10-CM | POA: Diagnosis not present

## 2017-05-08 DIAGNOSIS — M2041 Other hammer toe(s) (acquired), right foot: Secondary | ICD-10-CM | POA: Diagnosis not present

## 2017-05-08 DIAGNOSIS — M2042 Other hammer toe(s) (acquired), left foot: Secondary | ICD-10-CM | POA: Diagnosis not present

## 2017-05-10 DIAGNOSIS — H35373 Puckering of macula, bilateral: Secondary | ICD-10-CM | POA: Diagnosis not present

## 2017-05-10 DIAGNOSIS — H353113 Nonexudative age-related macular degeneration, right eye, advanced atrophic without subfoveal involvement: Secondary | ICD-10-CM | POA: Diagnosis not present

## 2017-05-10 DIAGNOSIS — H353222 Exudative age-related macular degeneration, left eye, with inactive choroidal neovascularization: Secondary | ICD-10-CM | POA: Diagnosis not present

## 2017-05-10 DIAGNOSIS — H35371 Puckering of macula, right eye: Secondary | ICD-10-CM | POA: Diagnosis not present

## 2017-05-15 ENCOUNTER — Telehealth: Payer: Self-pay

## 2017-05-15 NOTE — Telephone Encounter (Signed)
Refill request sent for Meloxicam. Looks like change in therapy on 04/13/17. Please advise.

## 2017-05-15 NOTE — Telephone Encounter (Signed)
Changed from meloxicam to generic celebrex at the end of January 2019. Do not RF meloxicam.-thx

## 2017-05-17 ENCOUNTER — Telehealth: Payer: Self-pay | Admitting: Internal Medicine

## 2017-05-17 DIAGNOSIS — M47817 Spondylosis without myelopathy or radiculopathy, lumbosacral region: Secondary | ICD-10-CM | POA: Diagnosis not present

## 2017-05-17 NOTE — Telephone Encounter (Signed)
Patient has been having diarrhea in the afternoons after a meal.  She will come in and see Nicoletta Ba PA on 05/21/17

## 2017-05-18 DIAGNOSIS — Z96651 Presence of right artificial knee joint: Secondary | ICD-10-CM | POA: Diagnosis not present

## 2017-05-18 DIAGNOSIS — M76899 Other specified enthesopathies of unspecified lower limb, excluding foot: Secondary | ICD-10-CM | POA: Diagnosis not present

## 2017-05-21 ENCOUNTER — Other Ambulatory Visit: Payer: Medicare Other

## 2017-05-21 ENCOUNTER — Encounter: Payer: Self-pay | Admitting: Physician Assistant

## 2017-05-21 ENCOUNTER — Ambulatory Visit (INDEPENDENT_AMBULATORY_CARE_PROVIDER_SITE_OTHER): Payer: Medicare Other | Admitting: Physician Assistant

## 2017-05-21 VITALS — BP 122/84 | HR 72 | Ht 62.0 in | Wt 193.5 lb

## 2017-05-21 DIAGNOSIS — R197 Diarrhea, unspecified: Secondary | ICD-10-CM | POA: Diagnosis not present

## 2017-05-21 DIAGNOSIS — K580 Irritable bowel syndrome with diarrhea: Secondary | ICD-10-CM

## 2017-05-21 MED ORDER — DICYCLOMINE HCL 10 MG PO CAPS: ORAL_CAPSULE | ORAL | 6 refills | Status: AC

## 2017-05-21 NOTE — Progress Notes (Addendum)
Subjective:    Patient ID: Katherine Best, female    DOB: 1933-05-14, 84 y.o.   MRN: [id number]  HPI Katherine Best is a pleasant 84 year old female, known to Dr. Carlean Purl who comes in today with complaints of  Diarrhea. Patient has history of IBS, diverticulosis, intermittent diarrhea, adult-onset diabetes mellitus, hypothyroidism, coronary artery disease and is status post right knee replacement in August 2018. She was seen here last spring and was given a course of Xifaxan for complaints of diarrhea. I don't believe that made much difference. Reviewing her chart she had also empirically been given a course of Entocort several years ago benefit. Random biopsies were done in 2008 showing some aggregates of eosinophils, felt to be nonspecific. She underwent colonoscopy in May 2018 with finding of multiple sigmoid diverticuli, 3 diminutive polyps were removed one was an adenoma, 1 benign lymphoid tissue and one benign colonic mucosa. Fine She says she had actually done well for several months, just taking an occasional half of an OTC Imodium. Now over the past 3 weeks she has had diarrhea off and on an over the past week has gotten to the point of having watery stools. She's having urgency postprandially. She says last evening she had 3 episodes of "pure water" diarrhea. No melena or hematochezia. She has no complaints of abdominal pain or cramping, no fever or chills no nausea or vomiting. She did take a short course of antibiotics a couple of times within the past few months for UTIs. She's trying to remember a medication that Dr. Rachelle Hora gave her in the past which definitely helped her diarrhea.  Review of Systems Pertinent positive and negative review of systems were noted in the above HPI section.  All other review of systems was otherwise negative.  Outpatient Encounter Medications as of 05/21/2017  Medication Sig  . albuterol (PROVENTIL HFA;VENTOLIN HFA) 108 (90 Base) MCG/ACT inhaler Inhale 1  puff into the lungs every 6 (six) hours as needed for wheezing or shortness of breath.  . ALPRAZolam (XANAX) 0.25 MG tablet Take 0.25 mg by mouth at bedtime as needed for anxiety.  Marland Kitchen aspirin 81 MG chewable tablet Chew 1 tablet (81 mg total) by mouth 2 (two) times daily. for 4 weeks post-operative  . atorvastatin (LIPITOR) 20 MG tablet TAKE ONE TABLET BY MOUTH DAILY  . Calcium Carbonate (CALCIUM 600 PO) Take 1 tablet by mouth daily.  . celecoxib (CELEBREX) 200 MG capsule Take 1 capsule (200 mg total) by mouth daily.  . DULoxetine (CYMBALTA) 60 MG capsule Take 1 capsule (60 mg total) by mouth daily.  Marland Kitchen estradiol (ESTRACE) 1 MG tablet Take 1 tablet (1 mg total) by mouth daily.  . ferrous sulfate 325 (65 FE) MG tablet Take 1 tablet (325 mg total) by mouth 2 (two) times daily with a meal.  . furosemide (LASIX) 40 MG tablet Take 1 tablet as needed for swelling  . glucose blood test strip Use to check blood sugar once daily  . HYDROcodone-acetaminophen (NORCO) 7.5-325 MG tablet Take 1-2 tablets by mouth every 6 (six) hours.  . Lancets 30G MISC Use to check blood sugar once daily  . levothyroxine (SYNTHROID, LEVOTHROID) 25 MCG tablet Take 1 tablet (25 mcg total) by mouth daily.  Marland Kitchen loperamide (IMODIUM) 2 MG capsule Take 2 mg by mouth as needed for diarrhea or loose stools. Reported on 05/10/2015  . loratadine (CLARITIN) 10 MG tablet Take 10 mg by mouth daily as needed for allergies.  Marland Kitchen losartan-hydrochlorothiazide (HYZAAR) 50-12.5 MG  tablet Take 1 tablet by mouth daily.  . metFORMIN (GLUCOPHAGE-XR) 500 MG 24 hr tablet TAKE TWO TABLETS BY MOUTH DAILY WITH BREAKFAST  . metoprolol tartrate (LOPRESSOR) 50 MG tablet TAKE 1 TABLET BY MOUTH TWICE A DAY  . mirtazapine (REMERON) 15 MG tablet Take 0.5 tablets (7.5 mg total) by mouth at bedtime.  . Multiple Vitamins-Minerals (MULTIVITAMIN ADULT PO) Take 1 tablet by mouth daily.   Marland Kitchen omeprazole (PRILOSEC) 20 MG capsule Take 1 capsule (20 mg total) by mouth daily.  Marland Kitchen  oxybutynin (DITROPAN-XL) 10 MG 24 hr tablet Take 1 tablet by mouth daily.  . pioglitazone (ACTOS) 15 MG tablet Take 1 tablet (15 mg total) by mouth daily.  . potassium chloride SA (K-DUR,KLOR-CON) 20 MEQ tablet Take 1 tablet (20 mEq total) by mouth daily.  . pregabalin (LYRICA) 50 MG capsule Take 1 capsule (50 mg total) by mouth 2 (two) times daily.  Marland Kitchen tiZANidine (ZANAFLEX) 4 MG tablet Take 1 tablet by mouth every 8 (eight) hours as needed.  . [DISCONTINUED] atorvastatin (LIPITOR) 20 MG tablet TAKE 1 TABLET BY MOUTH EVERY DAY  . [DISCONTINUED] Multiple Vitamins-Minerals (PRESERVISION AREDS 2 PO) Take 1 capsule by mouth 2 (two) times daily.  Marland Kitchen dicyclomine (BENTYL) 10 MG capsule Take 1 tab 2-3 times daily for diarrhea/urgency.   No facility-administered encounter medications on file as of 05/21/2017.    Allergies  Allergen Reactions  . Gabapentin Itching  . Sulfonamide Derivatives     REACTION: Nausea   Patient Active Problem List   Diagnosis Date Noted  . Status post unilateral knee replacement, right 11/06/2016  . Hx of adenomatous polyp of colon 08/25/2016  . Poor balance 08/22/2016  . OSA (obstructive sleep apnea) 08/01/2016  . Special screening for malignant neoplasms, colon 07/25/2016  . Chronic diarrhea 07/25/2016  . Diabetes mellitus with skin complications ([facility])   . Depression with anxiety 11/04/2013  . Diastolic dysfunction 77/82/4235  . Restless leg syndrome, controlled 05/06/2013  . Lightheadedness 01/10/2013  . Right ear pain 01/10/2013  . Osteoarthritis of lumbar spine 10/31/2012  . Dyspnea on exertion 04/12/2012  . Cervical neck pain with evidence of disc disease 04/09/2012  . Disc disorder of cervical region 04/09/2012  . Back pain with radiation 02/13/2012  . Fatigue 10/25/2011  . Malaise and fatigue 10/25/2011  . CAD (coronary artery disease) 10/19/2011  . Diastolic dysfunction, left ventricle 09/07/2011  . Chronic cough 02/03/2011  . Insomnia 12/04/2010  .  TARSAL TUNNEL SYNDROME 01/12/2010  . VENOUS INSUFFICIENCY, LEGS 01/12/2010  . SWEATING 01/12/2010  . OBESITY 05/27/2009  . LIVER FUNCTION TESTS, ABNORMAL, HX OF 03/31/2009  . MENOPAUSAL SYNDROME 12/31/2008  . ANXIETY 04/26/2007  . INTERSTITIAL CYSTITIS 04/26/2007  . DIARRHEA, CHRONIC 04/26/2007  . BURSITIS, HIP 02/22/2007  . EXTERNAL HEMORRHOIDS 11/14/2006  . DIVERTICULOSIS OF COLON 11/14/2006  . HYPOTHYROIDISM NOS 10/10/2006  . Hyperlipidemia 10/10/2006  . MORTON'S NEUROMA 10/10/2006  . Essential hypertension 10/10/2006  . IRRITABLE BOWEL SYNDROME 10/10/2006   Social History   Socioeconomic History  . Marital status: Married    Spouse name: Not on file  . Number of children: Not on file  . Years of education: Not on file  . Highest education level: Not on file  Social Needs  . Financial resource strain: Not on file  . Food insecurity - worry: Not on file  . Food insecurity - inability: Not on file  . Transportation needs - medical: Not on file  . Transportation needs - non-medical: Not on file  Occupational History  . Not on file  Tobacco Use  . Smoking status: Former Smoker    Packs/day: 1.00    Years: 35.00    Pack years: 35.00    Types: Cigarettes    Last attempt to quit: 03/27/2002    Years since quitting: 15.1  . Smokeless tobacco: Never Used  Substance and Sexual Activity  . Alcohol use: No    Alcohol/week: 0.0 oz  . Drug use: No  . Sexual activity: Not Currently  Other Topics Concern  . Not on file  Social History Narrative   Married '57   3 sons- '60, '61, '64, grandchildren 69 (7 girls, 1 boy)   Lives independently with husband   Patient is a former smoker: quit 2004.   Alcohol use- yes socially not in years.   Illicit Drug use- no   Occupation: Biomedical scientist    Katherine Best's family history includes Breast cancer in her mother; Cancer in her mother; Diabetes in her maternal grandfather; Hyperlipidemia in her father; Hypertension in her  father.      Objective:    Vitals:   05/21/17 1434  BP: 122/84  Pulse: 72    Physical Exam ; well-developed elderly white female in no acute distress, pleasant blood pressure 122/84 pulse 72, height 5 foot 2, weight 193, BMI of 35.3. HEENT ;nontraumatic normocephalic EOMI PERRLA sclera anicteric, Cardiovascular; regular rate and rhythm with S1-S2 no murmur rub or gallop, Pulmonary; clear bilaterally, Abdomen; soft, nontender no palpable mass or hepatosplenomegaly bowel sounds are active, Rectal exam not done, Ext; no clubbing cyanosis or edema skin warm and dry, She ambulates with difficulty Neuropsych; mood and affect appropriate       Assessment & Plan:   #86 84 year old female with history of IBS, and periodic diarrhea now with 3 week history of intermittent diarrhea worsening over the past week to the point of being watery and urgent. This may be all secondary to IBS- D, however with recent antibiotic use will also rule out C. Difficile One would question underlying microscopic colitis, random biopsies in 2008 were nonspecific but did show small aggregates of eosinophils. She had been given an empiric course of Entocort once in the past without much benefit. Patient is currently on Celebrex which is relatively new for her, this may also be contributing to diarrhea #2 history of adenomatous colon polyps-last colonoscopy May 2018-no follow-up scheduled due to age #3 diverticulosis #4.onset diabetes mellitus #5 hypertension #6 degenerative disc disease  Plan; We'll check stool for C. difficile by PCR Start Bentyl 10 mg by mouth twice a day to 3 times a day to be taken regularly short-term. If patient finds this helpful she can take regularly for maintenance. She is scheduled to have a nerve ablation on her back next week, hopefully she can also discontinue Celebrex in the near future.   Katherine Erck S Tyshauna Finkbiner PA-C 05/21/2017   Cc: Katherine Sou, MD   Agree with Katherine Best  assessment and plan. Gatha Mayer, MD, [facility]

## 2017-05-21 NOTE — Patient Instructions (Addendum)
Please go to the basement level to our lab for a stool study.  We have sent the following medications to your pharmacy for you to pick up at your convenience: [place], [place].

## 2017-05-22 ENCOUNTER — Other Ambulatory Visit: Payer: Medicare Other

## 2017-05-22 ENCOUNTER — Telehealth: Payer: Self-pay | Admitting: *Deleted

## 2017-05-22 DIAGNOSIS — K580 Irritable bowel syndrome with diarrhea: Secondary | ICD-10-CM | POA: Diagnosis not present

## 2017-05-22 DIAGNOSIS — R197 Diarrhea, unspecified: Secondary | ICD-10-CM

## 2017-05-22 NOTE — Telephone Encounter (Signed)
I called and advised the patient that her prescription for the Bentyl ( Dicyclomine) was approved. She said she just got a call from the insurance company . She has already picked up the prescription.The pharmacy tech called and said they can refund her some money.  She thanked me for calling.

## 2017-05-24 LAB — CLOSTRIDIUM DIFFICILE BY PCR: CDIFFPCR: NEGATIVE

## 2017-05-26 ENCOUNTER — Encounter: Payer: Self-pay | Admitting: Family Medicine

## 2017-06-08 DIAGNOSIS — M47816 Spondylosis without myelopathy or radiculopathy, lumbar region: Secondary | ICD-10-CM | POA: Diagnosis not present

## 2017-06-08 DIAGNOSIS — M47817 Spondylosis without myelopathy or radiculopathy, lumbosacral region: Secondary | ICD-10-CM | POA: Diagnosis not present

## 2017-06-11 ENCOUNTER — Other Ambulatory Visit: Payer: Self-pay | Admitting: Family Medicine

## 2017-06-11 DIAGNOSIS — M2042 Other hammer toe(s) (acquired), left foot: Secondary | ICD-10-CM | POA: Diagnosis not present

## 2017-06-11 DIAGNOSIS — M79672 Pain in left foot: Secondary | ICD-10-CM | POA: Diagnosis not present

## 2017-06-12 ENCOUNTER — Encounter: Payer: Self-pay | Admitting: Family Medicine

## 2017-06-12 ENCOUNTER — Ambulatory Visit (INDEPENDENT_AMBULATORY_CARE_PROVIDER_SITE_OTHER): Payer: Medicare Other | Admitting: Family Medicine

## 2017-06-12 VITALS — BP 120/63 | HR 78 | Temp 97.2°F | Ht 62.0 in | Wt 194.0 lb

## 2017-06-12 DIAGNOSIS — J0100 Acute maxillary sinusitis, unspecified: Secondary | ICD-10-CM | POA: Diagnosis not present

## 2017-06-12 MED ORDER — AMOXICILLIN 500 MG PO CAPS: 500.0000 mg | ORAL_CAPSULE | Freq: Three times a day (TID) | ORAL | 0 refills | Status: DC

## 2017-06-12 NOTE — Progress Notes (Signed)
Katherine Best , 03-27-1934, 84 y.o., female MRN: [id number] Patient Care Team    Relationship Specialty Notifications Start End  [name], [name], MD PCP - General Family Medicine  09/02/14   Collene Gobble, MD  Pulmonary Disease  07/10/12   Fay Records, MD Consulting Physician Cardiology  12/30/14   Suella Broad, MD Consulting Physician Physical Medicine and Rehabilitation  12/30/14   Paralee Cancel, MD Consulting Physician Orthopedic Surgery  12/30/14   Kathie Rhodes, MD Consulting Physician Urology  12/30/14   Darleen Crocker, MD Consulting Physician Ophthalmology  07/02/15   Chesley Mires, MD Consulting Physician Pulmonary Disease  07/17/16   Gatha Mayer, MD Consulting Physician Gastroenterology  07/25/16   Susa Day, [place] Physician Orthopedic Surgery  03/29/17     Chief Complaint  Patient presents with  . Cough    pt c/o of non-productive cough and congestion X 3 days.     Subjective: Pt presents for an OV with complaints of cough of 3 days duration.  Associated symptoms include nasal congestion and rhinorrhea, mild facial pressure, ear fullness and popping. She is eating and drinking well. She denies fever, chills. Nausea, vomit or rash. She reports a "mild dry cough" all winter, but now it feels like she has phlegm and can not cough it up.  Pt has tried robitussin to ease their symptoms.   Depression screen [facility] 2/9 04/13/2017 07/10/2016 07/02/2015 09/02/2014  Decreased Interest 1 0 0 1  Down, Depressed, Hopeless 0 0 0 0  PHQ - 2 Score 1 0 0 1  Altered sleeping 0 - - -  Tired, decreased energy 1 - - -  Change in appetite 0 - - -  Feeling bad or failure about yourself  0 - - -  Trouble concentrating 0 - - -  Moving slowly or fidgety/restless 0 - - -  Suicidal thoughts 0 - - -  PHQ-9 Score 2 - - -  Difficult doing work/chores Not difficult at all - - -  Some recent data might be hidden    Allergies  Allergen Reactions  . Gabapentin Itching  . Sulfonamide  Derivatives     REACTION: Nausea   Social History   Tobacco Use  . Smoking status: Former Smoker    Packs/day: 1.00    Years: 35.00    Pack years: 35.00    Types: Cigarettes    Last attempt to quit: 03/27/2002    Years since quitting: 15.2  . Smokeless tobacco: Never Used  Substance Use Topics  . Alcohol use: No    Alcohol/week: 0.0 oz   Past Medical History:  Diagnosis Date  . Abdominal pain, right lower quadrant   . Bursitis   . Cataracts, both eyes    soon to have cataract surg as of 03/2015  . Diabetes mellitus with complication (HCC)    DPN  . Diabetic peripheral neuropathy associated with type 2 diabetes mellitus ([place])   . Diverticulosis of colon    noted on colonoscopy 2008  . Dyspnea    a. 09/2011 CTA Chest: No PE, Ca2+ cors;  b. 09/2011 R&L heart Cath: Relatively nl R heart pressures, nl co/ci, nonobs cad, nl EF.  12/2015 stress test normal, echo with grd II DD--likely explains DOE.  Marland Kitchen External hemorrhoid   . Fatigue    w/ ? excessive daytime somnolence; ? OSA--eval by Dr. Halford Chessman 06/2016, home sleep study ordered.  . Former smoker    30 pack-yrs, quit 2005  .  Heart murmur    hx of  . Hot flashes    restarted estradiol 01/2015  . Hx of adenomatous polyp of colon 08/25/2016   07/2016 no recall  . Hyperlipemia, mixed    trigs remain elevated (200s-300s range), but LDL at goal.  . Hypertension   . Hypothyroidism   . IBS (irritable bowel syndrome)    IBS-D (Dr. Carlean Purl) + ? worsened by GI side effect of metformin  . Interstitial cystitis   . Liver function study, abnormal   . Lumbar degenerative disc disease    Facet-mediated pain per Dr. Nelva Bush.  Has gotten repeated LB injections, most recently April 2016 in [place].  Eval by Dr. Tonita Cong 03/2017.  Spinal stenosis without neurogenic claudication.  Arthropathy of lumbar facet jt: pt pursuing radiofrequency rhizotomy as of 04/23/17 ortho f/u  . Lumbar scoliosis    lumbosacral spondylosis w/out myelopathy (radiofrequency  ablation/lesioning procedure planned for 05/2017).  . Macular degeneration    left eye  . Menopausal syndrome   . Morton's neuroma   . Obesity, Class II, BMI 35-39.9   . OSA on CPAP 07/2016   HST 07/27/16 >> AHI 6.2, SaO2 low 79%.  CPAP recommended by pulm  . Other specified gastritis without mention of hemorrhage    hx of antral gastritis on EGD 05/2009  . Peripheral neuropathy    ? DPN?--per EMG 11/2012.  Intolerant of gabapentin, failed pamelor.  Radiofrequency neurotomy 12/11/14 helped LB pain (Dr. Nelva Bush)  . Pneumonia   . Recurrent UTI    on daily antibiotic prophylaxis by her urologist.  . RLS (restless legs syndrome)   . Sleep apnea   . Tarsal tunnel syndrome   . Unspecified venous (peripheral) insufficiency    Past Surgical History:  Procedure Laterality Date  . ANKLE SURGERY     bilateral  . CARDIAC CATHETERIZATION  09/2011   Nonobstructive CAD  . CARDIOVASCULAR STRESS TEST  01/04/2016   Nuclear medicine stress test: NORMAL  . CARDIOVASCULAR STRESS TEST  01/04/2016   Normal stress nuclear study with no ischemia or infarction; EF 82 with normal wall motion  . CATARACT EXTRACTION    . COLONOSCOPY  2002/2005, 11/14/06, and 05/2009   polyps 2002 but none 2005 or 2008 or 2011.  Repeat 08/17/16 for diarrhea: one diminutive adenoma + small polyps removed, diverticulosis noted--no further colonoscopies needed (Dr. Carlean Purl).  Marland Kitchen DEXA  10/2016   NORMAL--consider repeat 2 yrs.  . ESOPHAGOGASTRODUODENOSCOPY  05/2009   Moderate antral gastritis  . FOOT SURGERY     R foot bunionectomy and arthroplasty of digits R foot.  Arthroplasty digits 3 and 4 L foot.  Marland Kitchen HAMMER TOE SURGERY     bilateral  . left tarsal tunnel release     sept '11 (Dr Beola Cord)  . PARTIAL KNEE ARTHROPLASTY Right 11/06/2016   Procedure: UNICOMPARTMENTAL RIGHT KNEE- Medially;  Surgeon: Paralee Cancel, MD;  Location: [facility];  Service: Orthopedics;  Laterality: Right;  90 mins  . radiofrequency neurotomy  06/18/2015   bilat  L3-4 medial branch nerve and bilat L5 dorsal ramus nerve.(Dr. Nelva Bush)  . TONSILLECTOMY    . TOTAL ABDOMINAL HYSTERECTOMY W/ BILATERAL SALPINGOOPHORECTOMY    . TRANSTHORACIC ECHOCARDIOGRAM  08/2011; 2017   2013 Grade I DD; 2017 EF 60-65%, normal wall motion, grd II DD.  . unicompartmental right knee     11/06/16 Dr. Alvan Dame   Family History  Problem Relation Age of Onset  . Cancer Mother        Breast  .  Breast cancer Mother   . Hypertension Father   . Hyperlipidemia Father   . Diabetes Maternal Grandfather   . Colon cancer Neg Hx    Allergies as of 06/12/2017      Reactions   Gabapentin Itching   Sulfonamide Derivatives    REACTION: Nausea      Medication List        Accurate as of 06/12/17  9:37 AM. Always use your most recent med list.          albuterol 108 (90 Base) MCG/ACT inhaler Commonly known as:  PROVENTIL HFA;VENTOLIN HFA Inhale 1 puff into the lungs every 6 (six) hours as needed for wheezing or shortness of breath.   ALPRAZolam 0.25 MG tablet Commonly known as:  XANAX Take 0.25 mg by mouth at bedtime as needed for anxiety.   aspirin 81 MG chewable tablet Chew 1 tablet (81 mg total) by mouth 2 (two) times daily. for 4 weeks post-operative   atorvastatin 20 MG tablet Commonly known as:  LIPITOR TAKE ONE TABLET BY MOUTH DAILY   CALCIUM 600 PO Take 1 tablet by mouth daily.   celecoxib 200 MG capsule Commonly known as:  CELEBREX Take 1 capsule (200 mg total) by mouth daily.   dicyclomine 10 MG capsule Commonly known as:  BENTYL Take 1 tab 2-3 times daily for diarrhea/urgency.   DULoxetine 60 MG capsule Commonly known as:  CYMBALTA Take 1 capsule (60 mg total) by mouth daily.   estradiol 1 MG tablet Commonly known as:  ESTRACE Take 1 tablet (1 mg total) by mouth daily.   ferrous sulfate 325 (65 FE) MG tablet Take 1 tablet (325 mg total) by mouth 2 (two) times daily with a meal.   furosemide 40 MG tablet Commonly known as:  LASIX Take 1 tablet as  needed for swelling   glucose blood test strip Use to check blood sugar once daily   HYDROcodone-acetaminophen 7.5-325 MG tablet Commonly known as:  NORCO Take 1-2 tablets by mouth every 6 (six) hours.   Lancets 30G Misc Use to check blood sugar once daily   levothyroxine 25 MCG tablet Commonly known as:  SYNTHROID, LEVOTHROID Take 1 tablet (25 mcg total) by mouth daily.   loperamide 2 MG capsule Commonly known as:  IMODIUM Take 2 mg by mouth as needed for diarrhea or loose stools. Reported on 05/10/2015   loratadine 10 MG tablet Commonly known as:  CLARITIN Take 10 mg by mouth daily as needed for allergies.   losartan-hydrochlorothiazide 50-12.5 MG tablet Commonly known as:  HYZAAR Take 1 tablet by mouth daily.   metFORMIN 500 MG 24 hr tablet Commonly known as:  GLUCOPHAGE-XR TAKE TWO TABLETS BY MOUTH DAILY WITH BREAKFAST   metoprolol tartrate 50 MG tablet Commonly known as:  LOPRESSOR TAKE 1 TABLET BY MOUTH TWICE A DAY   mirtazapine 15 MG tablet Commonly known as:  REMERON Take 0.5 tablets (7.5 mg total) by mouth at bedtime.   MULTIVITAMIN ADULT PO Take 1 tablet by mouth daily.   omeprazole 20 MG capsule Commonly known as:  PRILOSEC Take 1 capsule (20 mg total) by mouth daily.   oxybutynin 10 MG 24 hr tablet Commonly known as:  DITROPAN-XL Take 1 tablet by mouth daily.   pioglitazone 15 MG tablet Commonly known as:  ACTOS Take 1 tablet (15 mg total) by mouth daily.   potassium chloride SA 20 MEQ tablet Commonly known as:  K-DUR,KLOR-CON Take 1 tablet (20 mEq total) by mouth daily.  pregabalin 50 MG capsule Commonly known as:  LYRICA Take 1 capsule (50 mg total) by mouth 2 (two) times daily.   tiZANidine 4 MG tablet Commonly known as:  ZANAFLEX Take 1 tablet by mouth every 8 (eight) hours as needed.       All past medical history, surgical history, allergies, family history, immunizations andmedications were updated in the EMR today and reviewed  under the history and medication portions of their EMR.     ROS: Negative, with the exception of above mentioned in HPI   Objective:  BP 120/63 (BP Location: Left Arm, Patient Position: Sitting, Cuff Size: Large)   Pulse 78   Temp (!) 97.2 F (36.2 C) (Oral)   Ht 5\' 2"  (1.575 m)   Wt 194 lb (88 kg)   SpO2 98%   BMI 35.48 kg/m  Body mass index is 35.48 kg/m. Gen: Afebrile. No acute distress. Nontoxic in appearance, well developed, well nourished.  HENT: AT. [place]. Bilateral TM visualized with fullness bilaterally, no erythema. MMM, no oral lesions. Bilateral nares with erythema, drainage and swelling. Throat without erythema or exudates. Cough present.  Eyes:Pupils Equal Round Reactive to light, Extraocular movements intact,  Conjunctiva without redness, discharge or icterus. Neck/lymp/endocrine: Supple,no lymphadenopathy CV: RRR Chest: CTAB, no wheeze or crackles. Good air movement, normal resp effort.  Skin: no rashes, purpura or petechiae.  Neuro:  Normal gait. PERLA. EOMi. Alert. Oriented x3  No exam data present No results found. No results found for this or any previous visit (from the past 24 hour(s)).  Assessment/Plan: Katherine Best is a 84 y.o. female present for OV for  Acute maxillary sinusitis, recurrence not specified Possible early bacterial infection. She is traveling in 2 days and concerned. Discussed use of abx if symptoms worsen or persist. Rest, hydrate.  +/- flonase, mucinex (DM if cough), nettie pot or nasal saline.  amox prescribed, take until completed.  If cough present it can last up to 6-8 weeks.  F/U 2 weeks of not improved.    Reviewed expectations re: course of current medical issues.  Discussed self-management of symptoms.  Outlined signs and symptoms indicating need for more acute intervention.  Patient verbalized understanding and all questions were answered.  Patient received an After-Visit Summary.    No orders of the defined types were  placed in this encounter.    Note is dictated utilizing voice recognition software. Although note has been proof read prior to signing, occasional typographical errors still can be missed. If any questions arise, please do not hesitate to call for verification.   electronically signed by:  Howard Pouch, DO  [place]

## 2017-06-12 NOTE — Patient Instructions (Signed)
Rest, hydrate.  Start  flonase, mucinex (DM if cough) daily  Prescribed amoxicillin every 8 hours for 10 days  prescribed, take until completed.  If cough present it can last up to 6-8 weeks.  F/U 2 weeks of not improved.     Sinusitis, Adult Sinusitis is soreness and inflammation of your sinuses. Sinuses are hollow spaces in the bones around your face. They are located:  Around your eyes.  In the middle of your forehead.  Behind your nose.  In your cheekbones.  Your sinuses and nasal passages are lined with a stringy fluid (mucus). Mucus normally drains out of your sinuses. When your nasal tissues get inflamed or swollen, the mucus can get trapped or blocked so air cannot flow through your sinuses. This lets bacteria, viruses, and funguses grow, and that leads to infection. Follow these instructions at home: Medicines  Take, use, or apply over-the-counter and prescription medicines only as told by your doctor. These may include nasal sprays.  If you were prescribed an antibiotic medicine, take it as told by your doctor. Do not stop taking the antibiotic even if you start to feel better. Hydrate and Humidify  Drink enough water to keep your pee (urine) clear or pale yellow.  Use a cool mist humidifier to keep the humidity level in your home above 50%.  Breathe in steam for 10-15 minutes, 3-4 times a day or as told by your doctor. You can do this in the bathroom while a hot shower is running.  Try not to spend time in cool or dry air. Rest  Rest as much as possible.  Sleep with your head raised (elevated).  Make sure to get enough sleep each night. General instructions  Put a warm, moist washcloth on your face 3-4 times a day or as told by your doctor. This will help with discomfort.  Wash your hands often with soap and water. If there is no soap and water, use hand sanitizer.  Do not smoke. Avoid being around people who are smoking (secondhand smoke).  Keep all  follow-up visits as told by your doctor. This is important. Contact a doctor if:  You have a fever.  Your symptoms get worse.  Your symptoms do not get better within 10 days. Get help right away if:  You have a very bad headache.  You cannot stop throwing up (vomiting).  You have pain or swelling around your face or eyes.  You have trouble seeing.  You feel confused.  Your neck is stiff.  You have trouble breathing. This information is not intended to replace advice given to you by your health care provider. Make sure you discuss any questions you have with your health care provider. Document Released: 08/30/2007 Document Revised: 11/07/2015 Document Reviewed: 01/06/2015 Elsevier Interactive Patient Education  [employer].

## 2017-06-13 DIAGNOSIS — E1151 Type 2 diabetes mellitus with diabetic peripheral angiopathy without gangrene: Secondary | ICD-10-CM | POA: Diagnosis not present

## 2017-06-13 DIAGNOSIS — M2041 Other hammer toe(s) (acquired), right foot: Secondary | ICD-10-CM | POA: Diagnosis not present

## 2017-06-13 DIAGNOSIS — M79672 Pain in left foot: Secondary | ICD-10-CM | POA: Diagnosis not present

## 2017-06-13 DIAGNOSIS — M2042 Other hammer toe(s) (acquired), left foot: Secondary | ICD-10-CM | POA: Diagnosis not present

## 2017-06-18 DIAGNOSIS — M24575 Contracture, left foot: Secondary | ICD-10-CM | POA: Diagnosis not present

## 2017-06-18 DIAGNOSIS — L905 Scar conditions and fibrosis of skin: Secondary | ICD-10-CM | POA: Diagnosis not present

## 2017-06-18 DIAGNOSIS — M2042 Other hammer toe(s) (acquired), left foot: Secondary | ICD-10-CM | POA: Diagnosis not present

## 2017-06-21 DIAGNOSIS — M2042 Other hammer toe(s) (acquired), left foot: Secondary | ICD-10-CM | POA: Diagnosis not present

## 2017-06-28 ENCOUNTER — Encounter: Payer: Self-pay | Admitting: Internal Medicine

## 2017-06-28 NOTE — Telephone Encounter (Signed)
Diarrhea initially better on dicyclomine then worse after azithromycin and OTC cold Tx  I told her to try 20 mg dose dicyclomine and let me know

## 2017-07-05 DIAGNOSIS — H35349 Macular cyst, hole, or pseudohole, unspecified eye: Secondary | ICD-10-CM

## 2017-07-05 DIAGNOSIS — H35341 Macular cyst, hole, or pseudohole, right eye: Secondary | ICD-10-CM | POA: Diagnosis not present

## 2017-07-05 DIAGNOSIS — H353222 Exudative age-related macular degeneration, left eye, with inactive choroidal neovascularization: Secondary | ICD-10-CM | POA: Diagnosis not present

## 2017-07-05 HISTORY — DX: Macular cyst, hole, or pseudohole, unspecified eye: H35.349

## 2017-07-12 ENCOUNTER — Ambulatory Visit (INDEPENDENT_AMBULATORY_CARE_PROVIDER_SITE_OTHER): Payer: Medicare Other | Admitting: Family Medicine

## 2017-07-12 ENCOUNTER — Encounter: Payer: Self-pay | Admitting: Family Medicine

## 2017-07-12 VITALS — BP 117/72 | HR 66 | Temp 97.7°F | Ht 62.0 in | Wt 193.2 lb

## 2017-07-12 DIAGNOSIS — E039 Hypothyroidism, unspecified: Secondary | ICD-10-CM

## 2017-07-12 DIAGNOSIS — E119 Type 2 diabetes mellitus without complications: Secondary | ICD-10-CM | POA: Diagnosis not present

## 2017-07-12 DIAGNOSIS — R197 Diarrhea, unspecified: Secondary | ICD-10-CM

## 2017-07-12 DIAGNOSIS — E7800 Pure hypercholesterolemia, unspecified: Secondary | ICD-10-CM | POA: Diagnosis not present

## 2017-07-12 DIAGNOSIS — I10 Essential (primary) hypertension: Secondary | ICD-10-CM

## 2017-07-12 LAB — TSH: TSH: 2.34 u[IU]/mL (ref 0.35–4.50)

## 2017-07-12 LAB — HEMOGLOBIN A1C: HEMOGLOBIN A1C: 6.6 % — AB (ref 4.6–6.5)

## 2017-07-12 NOTE — Progress Notes (Signed)
OFFICE VISIT  07/16/2017   CC:  Chief Complaint  Patient presents with  . Follow-up    RCI     HPI:    Patient is a 84 y.o. Caucasian female who presents for 3 mo f/u DM 2, HTN, HLD, hypothyroidism.  DM 2: 1000 mg metformin bid caused some diarrhea.  She has gone back to taking 1 metformin XR 500 mg qAM and restarted her actos. Glucoses 150 avg.  HTN: occ home bp normal.  Compliant with meds.  HLD: tolerating statin w/out problem.  Hypothyroidism: taking levothyroxine daily --correctly.    ROS: no palpitaitons, no sweating episode, no CP, no SOB, no abd pain, no dizziness.  No HAs.  No myalgias/arthralgias. No polyuria or polydipsia.  Past Medical History:  Diagnosis Date  . Abdominal pain, right lower quadrant   . Bursitis   . Cataracts, both eyes    soon to have cataract surg as of 03/2015  . Diabetes mellitus with complication (HCC)    DPN  . Diabetic peripheral neuropathy associated with type 2 diabetes mellitus ([place])   . Diverticulosis of colon    noted on colonoscopy 2008  . Dyspnea    a. 09/2011 CTA Chest: No PE, Ca2+ cors;  b. 09/2011 R&L heart Cath: Relatively nl R heart pressures, nl co/ci, nonobs cad, nl EF.  12/2015 stress test normal, echo with grd II DD--likely explains DOE.  Marland Kitchen External hemorrhoid   . Fatigue    w/ ? excessive daytime somnolence; ? OSA--eval by Dr. Halford Chessman 06/2016, home sleep study ordered.  . Former smoker    30 pack-yrs, quit 2005  . Heart murmur    hx of  . Hot flashes    restarted estradiol 01/2015  . Hx of adenomatous polyp of colon 08/25/2016   07/2016 no recall  . Hyperlipemia, mixed    trigs remain elevated (200s-300s range), but LDL at goal.  . Hypertension   . Hypothyroidism   . IBS (irritable bowel syndrome)    IBS-D (Dr. Carlean Purl) + ? worsened by GI side effect of metformin  . Interstitial cystitis   . Liver function study, abnormal   . Lumbar degenerative disc disease    Facet-mediated pain per Dr. Nelva Bush.  Has gotten  repeated LB injections, most recently April 2016 in [place].  Eval by Dr. Tonita Cong 03/2017.  Spinal stenosis without neurogenic claudication.  Arthropathy of lumbar facet jt: pt pursuing radiofrequency rhizotomy as of 04/23/17 ortho f/u  . Lumbar scoliosis    lumbosacral spondylosis w/out myelopathy (radiofrequency ablation/lesioning procedure planned for 05/2017).  . Macular degeneration    left eye  . Menopausal syndrome   . Morton's neuroma   . Obesity, Class II, BMI 35-39.9   . OSA on CPAP 07/2016   HST 07/27/16 >> AHI 6.2, SaO2 low 79%.  CPAP recommended by pulm  . Other specified gastritis without mention of hemorrhage    hx of antral gastritis on EGD 05/2009  . Peripheral neuropathy    ? DPN?--per EMG 11/2012.  Intolerant of gabapentin, failed pamelor.  Radiofrequency neurotomy 12/11/14 helped LB pain (Dr. Nelva Bush)  . Pneumonia   . Recurrent UTI    on daily antibiotic prophylaxis by her urologist.  . RLS (restless legs syndrome)   . Sleep apnea   . Tarsal tunnel syndrome   . Unspecified venous (peripheral) insufficiency     Past Surgical History:  Procedure Laterality Date  . ANKLE SURGERY     bilateral  . CARDIAC CATHETERIZATION  09/2011  Nonobstructive CAD  . CARDIOVASCULAR STRESS TEST  01/04/2016   Nuclear medicine stress test: NORMAL  . CARDIOVASCULAR STRESS TEST  01/04/2016   Normal stress nuclear study with no ischemia or infarction; EF 82 with normal wall motion  . CATARACT EXTRACTION    . COLONOSCOPY  2002/2005, 11/14/06, and 05/2009   polyps 2002 but none 2005 or 2008 or 2011.  Repeat 08/17/16 for diarrhea: one diminutive adenoma + small polyps removed, diverticulosis noted--no further colonoscopies needed (Dr. Carlean Purl).  Marland Kitchen DEXA  10/2016   NORMAL--consider repeat 2 yrs.  . ESOPHAGOGASTRODUODENOSCOPY  05/2009   Moderate antral gastritis  . FOOT SURGERY     R foot bunionectomy and arthroplasty of digits R foot.  Arthroplasty digits 3 and 4 L foot.  Marland Kitchen HAMMER TOE SURGERY      bilateral  . left tarsal tunnel release     sept '11 (Dr Beola Cord)  . PARTIAL KNEE ARTHROPLASTY Right 11/06/2016   Procedure: UNICOMPARTMENTAL RIGHT KNEE- Medially;  Surgeon: Paralee Cancel, MD;  Location: [facility];  Service: Orthopedics;  Laterality: Right;  90 mins  . radiofrequency neurotomy  06/18/2015   bilat L3-4 medial branch nerve and bilat L5 dorsal ramus nerve.(Dr. Nelva Bush)  . TONSILLECTOMY    . TOTAL ABDOMINAL HYSTERECTOMY W/ BILATERAL SALPINGOOPHORECTOMY    . TRANSTHORACIC ECHOCARDIOGRAM  08/2011; 2017   2013 Grade I DD; 2017 EF 60-65%, normal wall motion, grd II DD.  . unicompartmental right knee     11/06/16 Dr. Alvan Dame    Outpatient Medications Prior to Visit  Medication Sig Dispense Refill  . albuterol (PROVENTIL HFA;VENTOLIN HFA) 108 (90 Base) MCG/ACT inhaler Inhale 1 puff into the lungs every 6 (six) hours as needed for wheezing or shortness of breath. 6.7 g 0  . aspirin 81 MG chewable tablet Chew 1 tablet (81 mg total) by mouth 2 (two) times daily. for 4 weeks post-operative 60 tablet 0  . atorvastatin (LIPITOR) 20 MG tablet TAKE ONE TABLET BY MOUTH DAILY 90 tablet 1  . Calcium Carbonate (CALCIUM 600 PO) Take 1 tablet by mouth daily.    . celecoxib (CELEBREX) 200 MG capsule Take 1 capsule (200 mg total) by mouth daily. 30 capsule 6  . dicyclomine (BENTYL) 10 MG capsule Take 1 tab 2-3 times daily for diarrhea/urgency. 90 capsule 6  . DULoxetine (CYMBALTA) 60 MG capsule Take 1 capsule (60 mg total) by mouth daily. 90 capsule 1  . estradiol (ESTRACE) 1 MG tablet Take 1 tablet (1 mg total) by mouth daily. 90 tablet 3  . ferrous sulfate 325 (65 FE) MG tablet Take 1 tablet (325 mg total) by mouth 2 (two) times daily with a meal. 30 tablet 0  . furosemide (LASIX) 40 MG tablet Take 1 tablet as needed for swelling 90 tablet 1  . glucose blood test strip Use to check blood sugar once daily 100 each 11  . Lancets 30G MISC Use to check blood sugar once daily 100 each 11  . levothyroxine  (SYNTHROID, LEVOTHROID) 25 MCG tablet Take 1 tablet (25 mcg total) by mouth daily. 90 tablet 1  . loperamide (IMODIUM) 2 MG capsule Take 2 mg by mouth as needed for diarrhea or loose stools. Reported on 05/10/2015    . loratadine (CLARITIN) 10 MG tablet Take 10 mg by mouth daily as needed for allergies.    Marland Kitchen losartan-hydrochlorothiazide (HYZAAR) 50-12.5 MG tablet Take 1 tablet by mouth daily. 90 tablet 1  . metFORMIN (GLUCOPHAGE-XR) 500 MG 24 hr tablet TAKE TWO TABLETS  BY MOUTH DAILY WITH BREAKFAST 90 tablet 1  . metoprolol tartrate (LOPRESSOR) 50 MG tablet TAKE 1 TABLET BY MOUTH TWICE A DAY 180 tablet 1  . mirtazapine (REMERON) 15 MG tablet Take 0.5 tablets (7.5 mg total) by mouth at bedtime. 90 tablet 1  . Multiple Vitamins-Minerals (MULTIVITAMIN ADULT PO) Take 1 tablet by mouth daily.     Marland Kitchen oxybutynin (DITROPAN-XL) 10 MG 24 hr tablet Take 1 tablet by mouth daily.    . pioglitazone (ACTOS) 15 MG tablet TAKE ONE TABLET BY MOUTH DAILY 90 tablet 1  . potassium chloride SA (K-DUR,KLOR-CON) 20 MEQ tablet Take 1 tablet (20 mEq total) by mouth daily. 90 tablet 3  . pregabalin (LYRICA) 50 MG capsule Take 1 capsule (50 mg total) by mouth 2 (two) times daily. 180 capsule 3  . tiZANidine (ZANAFLEX) 4 MG tablet Take 1 tablet by mouth every 8 (eight) hours as needed.  1  . omeprazole (PRILOSEC) 20 MG capsule Take 1 capsule (20 mg total) by mouth daily. 90 capsule 1  . pioglitazone (ACTOS) 15 MG tablet Take 1 tablet (15 mg total) by mouth daily. 90 tablet 1  . ALPRAZolam (XANAX) 0.25 MG tablet Take 0.25 mg by mouth at bedtime as needed for anxiety.    Marland Kitchen HYDROcodone-acetaminophen (NORCO) 7.5-325 MG tablet Take 1-2 tablets by mouth every 6 (six) hours. (Patient not taking: Reported on 07/12/2017) 60 tablet 0  . amoxicillin (AMOXIL) 500 MG capsule Take 1 capsule (500 mg total) by mouth 3 (three) times daily. 30 capsule 0   No facility-administered medications prior to visit.     Allergies  Allergen Reactions   . Gabapentin Itching  . Sulfonamide Derivatives     REACTION: Nausea    ROS As per HPI  PE: Blood pressure 117/72, pulse 66, temperature 97.7 F (36.5 C), temperature source Oral, height 5\' 2"  (1.575 m), weight 193 lb 3.2 oz (87.6 kg), SpO2 96 %. Body mass index is 35.34 kg/m.  Gen: Alert, well appearing.  Patient is oriented to person, place, time, and situation. IPJ:ASNK: no injection, icteris, swelling, or exudate.  EOMI, PERRLA. Mouth: lips without lesion/swelling.  Oral mucosa pink and moist. Oropharynx without erythema, exudate, or swelling.  CV: RRR, no m/r/g.   LUNGS: CTA bilat, nonlabored resps, good aeration in all lung fields. EXT; L LL 3+ pitting edema, R LL 2+ pitting edema.  LABS:  Lab Results  Component Value Date   TSH 2.34 07/12/2017   Lab Results  Component Value Date   WBC 11.2 (H) 01/11/2017   HGB 11.7 (L) 01/11/2017   HCT 35.9 (L) 01/11/2017   MCV 95.7 01/11/2017   PLT 340.0 01/11/2017   Lab Results  Component Value Date   CREATININE 0.72 04/17/2017   BUN 18 04/17/2017   NA 140 04/17/2017   K 4.2 04/17/2017   CL 101 04/17/2017   CO2 28 04/17/2017   Lab Results  Component Value Date   ALT 22 03/09/2016   AST 16 03/09/2016   ALKPHOS 81 03/09/2016   BILITOT 0.4 03/09/2016   Lab Results  Component Value Date   CHOL 154 04/17/2017   Lab Results  Component Value Date   HDL 46 (L) 04/17/2017   Lab Results  Component Value Date   LDLCALC 68 04/17/2017   Lab Results  Component Value Date   TRIG 334 (H) 04/17/2017   Lab Results  Component Value Date   CHOLHDL 3.3 04/17/2017   Lab Results  Component Value Date  HGBA1C 6.6 (H) 07/12/2017    IMPRESSION AND PLAN:  1) DM 2: historically well controlled. Some intolerance to higher doses of metformin. Doing fine currently on 500 mg metformin qAM and pioglitazone 15mg  qd. Check A1c today.  2) HTN: The current medical regimen is effective;  continue present plan and  medications. Lytes/cr good 03/2017.  3) HLD: The current medical regimen is effective;  continue present plan and medications. LDL-C 68 Jan 2019. Tolerating statin.  4) Hypothyroidism: TSH monitoring today.  An After Visit Summary was printed and given to the patient.  FOLLOW UP: Return in about 3 months (around 10/11/2017) for routine chronic illness f/u.  Signed:  Crissie Sickles, MD           07/16/2017

## 2017-07-14 ENCOUNTER — Other Ambulatory Visit: Payer: Self-pay | Admitting: Family Medicine

## 2017-07-18 ENCOUNTER — Telehealth: Payer: Self-pay | Admitting: Family Medicine

## 2017-07-18 DIAGNOSIS — M1612 Unilateral primary osteoarthritis, left hip: Secondary | ICD-10-CM | POA: Insufficient documentation

## 2017-07-18 DIAGNOSIS — M1611 Unilateral primary osteoarthritis, right hip: Secondary | ICD-10-CM | POA: Insufficient documentation

## 2017-07-18 NOTE — Telephone Encounter (Signed)
Please advise. Thanks.  

## 2017-07-18 NOTE — Telephone Encounter (Signed)
Copied from [place]. Topic: Quick Communication - See Telephone Encounter >> Jul 18, 2017 10:18 AM Synthia Innocent wrote: CRM for notification. See Telephone encounter for: 07/18/17. Pharmacy calling regarding drug interaction, dicyclomine (BENTYL) 10 MG capsule and potassium chloride SA (K-DUR,KLOR-CON) 20 MEQ tablet

## 2017-07-19 NOTE — Telephone Encounter (Signed)
OK, I authorize her to continue both of these meds as prescribed.-thx

## 2017-07-19 NOTE — Telephone Encounter (Signed)
April at [facility] advised and voiced understanding.

## 2017-07-23 ENCOUNTER — Other Ambulatory Visit: Payer: Self-pay | Admitting: Internal Medicine

## 2017-07-23 ENCOUNTER — Encounter: Payer: Self-pay | Admitting: Internal Medicine

## 2017-07-23 DIAGNOSIS — K580 Irritable bowel syndrome with diarrhea: Secondary | ICD-10-CM

## 2017-07-23 MED ORDER — ONDANSETRON HCL 4 MG PO TABS: 4.0000 mg | ORAL_TABLET | Freq: Three times a day (TID) | ORAL | 1 refills | Status: DC | PRN

## 2017-07-25 ENCOUNTER — Encounter: Payer: Self-pay | Admitting: Internal Medicine

## 2017-07-25 ENCOUNTER — Other Ambulatory Visit: Payer: Self-pay | Admitting: Internal Medicine

## 2017-07-25 DIAGNOSIS — R197 Diarrhea, unspecified: Secondary | ICD-10-CM

## 2017-07-26 ENCOUNTER — Other Ambulatory Visit: Payer: Medicare Other

## 2017-07-27 DIAGNOSIS — R197 Diarrhea, unspecified: Secondary | ICD-10-CM | POA: Diagnosis not present

## 2017-07-27 NOTE — Addendum Note (Signed)
Addended by: Ralph Dowdy on: 07/27/2017 11:15 AM   Modules accepted: Orders

## 2017-07-30 ENCOUNTER — Other Ambulatory Visit: Payer: Self-pay | Admitting: Family Medicine

## 2017-07-30 NOTE — Telephone Encounter (Signed)
RF request for celecoxib LOV: 07/12/17 Next ov: 10/10/17 Last written: 04/23/17 #30 w/ 6RF  Please advise. Thanks.

## 2017-07-31 ENCOUNTER — Encounter: Payer: Self-pay | Admitting: Family Medicine

## 2017-08-02 LAB — PANCREATIC ELASTASE, FECAL: Pancreatic Elastase-1, Stool: >500 mcg/g

## 2017-08-07 DIAGNOSIS — K219 Gastro-esophageal reflux disease without esophagitis: Secondary | ICD-10-CM | POA: Diagnosis not present

## 2017-08-07 DIAGNOSIS — E079 Disorder of thyroid, unspecified: Secondary | ICD-10-CM | POA: Diagnosis not present

## 2017-08-07 DIAGNOSIS — Z882 Allergy status to sulfonamides status: Secondary | ICD-10-CM | POA: Diagnosis not present

## 2017-08-07 DIAGNOSIS — Z888 Allergy status to other drugs, medicaments and biological substances status: Secondary | ICD-10-CM | POA: Diagnosis not present

## 2017-08-07 DIAGNOSIS — E119 Type 2 diabetes mellitus without complications: Secondary | ICD-10-CM | POA: Diagnosis not present

## 2017-08-07 DIAGNOSIS — H35341 Macular cyst, hole, or pseudohole, right eye: Secondary | ICD-10-CM | POA: Diagnosis not present

## 2017-08-07 DIAGNOSIS — Z791 Long term (current) use of non-steroidal anti-inflammatories (NSAID): Secondary | ICD-10-CM | POA: Diagnosis not present

## 2017-08-07 DIAGNOSIS — Z79899 Other long term (current) drug therapy: Secondary | ICD-10-CM | POA: Diagnosis not present

## 2017-08-07 DIAGNOSIS — I10 Essential (primary) hypertension: Secondary | ICD-10-CM | POA: Diagnosis not present

## 2017-08-07 DIAGNOSIS — Z87891 Personal history of nicotine dependence: Secondary | ICD-10-CM | POA: Diagnosis not present

## 2017-08-07 DIAGNOSIS — I2510 Atherosclerotic heart disease of native coronary artery without angina pectoris: Secondary | ICD-10-CM | POA: Diagnosis not present

## 2017-08-07 DIAGNOSIS — Z7984 Long term (current) use of oral hypoglycemic drugs: Secondary | ICD-10-CM | POA: Diagnosis not present

## 2017-08-07 DIAGNOSIS — Z79890 Hormone replacement therapy (postmenopausal): Secondary | ICD-10-CM | POA: Diagnosis not present

## 2017-08-07 DIAGNOSIS — Z7982 Long term (current) use of aspirin: Secondary | ICD-10-CM | POA: Diagnosis not present

## 2017-08-07 NOTE — Progress Notes (Signed)
This test is ok  It means pancreas doing its job well  How is she w/ IBS?

## 2017-08-08 DIAGNOSIS — H35341 Macular cyst, hole, or pseudohole, right eye: Secondary | ICD-10-CM | POA: Diagnosis not present

## 2017-08-08 NOTE — Progress Notes (Signed)
OK Stay on the dicyclomine and see me prn

## 2017-08-13 DIAGNOSIS — H35341 Macular cyst, hole, or pseudohole, right eye: Secondary | ICD-10-CM | POA: Diagnosis not present

## 2017-08-21 ENCOUNTER — Other Ambulatory Visit: Payer: Self-pay | Admitting: Family Medicine

## 2017-08-21 DIAGNOSIS — L570 Actinic keratosis: Secondary | ICD-10-CM | POA: Diagnosis not present

## 2017-08-21 DIAGNOSIS — D229 Melanocytic nevi, unspecified: Secondary | ICD-10-CM | POA: Diagnosis not present

## 2017-08-21 DIAGNOSIS — H353222 Exudative age-related macular degeneration, left eye, with inactive choroidal neovascularization: Secondary | ICD-10-CM | POA: Diagnosis not present

## 2017-08-30 DIAGNOSIS — L602 Onychogryphosis: Secondary | ICD-10-CM | POA: Diagnosis not present

## 2017-08-30 DIAGNOSIS — E1351 Other specified diabetes mellitus with diabetic peripheral angiopathy without gangrene: Secondary | ICD-10-CM | POA: Diagnosis not present

## 2017-09-03 DIAGNOSIS — H35341 Macular cyst, hole, or pseudohole, right eye: Secondary | ICD-10-CM | POA: Diagnosis not present

## 2017-09-03 DIAGNOSIS — M1612 Unilateral primary osteoarthritis, left hip: Secondary | ICD-10-CM | POA: Diagnosis not present

## 2017-09-03 DIAGNOSIS — M545 Low back pain: Secondary | ICD-10-CM | POA: Diagnosis not present

## 2017-09-03 DIAGNOSIS — M1611 Unilateral primary osteoarthritis, right hip: Secondary | ICD-10-CM | POA: Diagnosis not present

## 2017-09-05 ENCOUNTER — Encounter: Payer: Self-pay | Admitting: Family Medicine

## 2017-09-10 ENCOUNTER — Other Ambulatory Visit: Payer: Self-pay | Admitting: Family Medicine

## 2017-09-14 ENCOUNTER — Ambulatory Visit (INDEPENDENT_AMBULATORY_CARE_PROVIDER_SITE_OTHER): Payer: Medicare Other

## 2017-09-14 ENCOUNTER — Other Ambulatory Visit: Payer: Self-pay

## 2017-09-14 VITALS — BP 110/72 | HR 60 | Ht 62.0 in | Wt 192.4 lb

## 2017-09-14 DIAGNOSIS — Z0000 Encounter for general adult medical examination without abnormal findings: Secondary | ICD-10-CM

## 2017-09-14 DIAGNOSIS — E669 Obesity, unspecified: Secondary | ICD-10-CM | POA: Diagnosis not present

## 2017-09-14 NOTE — Progress Notes (Addendum)
Subjective:   Katherine Best is a 84 y.o. female who presents for Medicare Annual (Subsequent) preventive examination.  Review of Systems:  No ROS.  Medicare Wellness Visit. Additional risk factors are reflected in the social history.  Cardiac Risk Factors include: advanced age (>66men, >17 women);dyslipidemia;diabetes mellitus;obesity (BMI >30kg/m2);sedentary lifestyle;hypertension;family history of premature cardiovascular disease   Sleep patterns: Sleeps 9-10 hours, up to void x 1.  Home Safety/Smoke Alarms: Feels safe in home. Smoke alarms in place.  Living environment; residence and Firearm Safety: Lives with husband in 1 story home.  Seat Belt Safety/Bike Helmet: Wears seat belt.   Female:   Pap-N/A       Mammo-10/30/2016, BI-RADS CATEGORY  1: Negative.      Dexa scan-10/30/2016, normal.      CCS-Colonoscopy 08/17/2016, polyp. No recall.     Objective:     Vitals: BP 110/72 (BP Location: Left Arm, Patient Position: Sitting, Cuff Size: Normal)   Pulse 60   Ht 5\' 2"  (1.575 m)   Wt 192 lb 6 oz (87.3 kg)   SpO2 96%   BMI 35.19 kg/m   Body mass index is 35.19 kg/m.  Advanced Directives 09/14/2017 11/06/2016 11/06/2016 10/30/2016 08/17/2016 07/26/2016 07/10/2016  Does Patient Have a Medical Advance Directive? Yes Arnold Long Yes Yes Yes Yes  Type of Paramedic of [place];Living will Living will - Living will [place];Living will Living will Living will;Healthcare Power of Attorney  Does patient want to make changes to medical advance directive? - No - Patient declined No - Patient declined No - Patient declined - - -  Copy of [place] in Chart? No - copy requested - - - - - No - copy requested  Would patient like information on creating a medical advance directive? - No - Patient declined - - - - -  Pre-existing out of facility DNR order (yellow form or pink MOST form) - - - - - - -    Tobacco Social History    Tobacco Use  Smoking Status Former Smoker  . Packs/day: 1.00  . Years: 35.00  . Pack years: 35.00  . Types: Cigarettes  . Last attempt to quit: 03/27/2002  . Years since quitting: 15.4  Smokeless Tobacco Never Used     Counseling given: Not Answered    Past Medical History:  Diagnosis Date  . Abdominal pain, right lower quadrant   . Bursitis   . Cataracts, both eyes    soon to have cataract surg as of 03/2015  . Diabetes mellitus with complication (HCC)    DPN.  NO diab retinopathy as of 06/2017 eye exam.  . Diabetic peripheral neuropathy associated with type 2 diabetes mellitus ([place])   . Diverticulosis of colon    noted on colonoscopy 2008  . Dyspnea    a. 09/2011 CTA Chest: No PE, Ca2+ cors;  b. 09/2011 R&L heart Cath: Relatively nl R heart pressures, nl co/ci, nonobs cad, nl EF.  12/2015 stress test normal, echo with grd II DD--likely explains DOE.  Marland Kitchen External hemorrhoid   . Fatigue    w/ ? excessive daytime somnolence; ? OSA--eval by Dr. Halford Chessman 06/2016, home sleep study ordered.  . Former smoker    30 pack-yrs, quit 2005  . Heart murmur    hx of  . Hot flashes    restarted estradiol 01/2015  . Hx of adenomatous polyp of colon 08/25/2016   07/2016 no recall  . Hyperlipemia, mixed  trigs remain elevated (200s-300s range), but LDL at goal.  . Hypertension   . Hypothyroidism   . IBS (irritable bowel syndrome)    IBS-D (Dr. Carlean Purl) + ? worsened by GI side effect of metformin  . Interstitial cystitis   . Liver function study, abnormal   . Lumbar degenerative disc disease    Facet-mediated pain per Dr. Nelva Bush.  Has gotten repeated LB injections, most recently April 2016 in [place].  Eval by Dr. Tonita Cong 03/2017.  Spinal stenosis without neurogenic claudication.  Arthropathy of lumbar facet jt: pt pursuing radiofrequency rhizotomy as of 04/23/17 ortho f/u  . Lumbar scoliosis    lumbosacral spondylosis w/out myelopathy (radiofrequency ablation/lesioning procedure planned for 05/2017).   . Macular degeneration    OS exudative (avastin treatments).  Advanced nonexudative OD, monitor.  AREDS  . Macular hole 07/05/2017   OD; surgical intervention recommended  . Menopausal syndrome   . Morton's neuroma   . Obesity, Class II, BMI 35-39.9   . OSA on CPAP 07/2016   HST 07/27/16 >> AHI 6.2, SaO2 low 79%.  CPAP recommended by pulm  . Other specified gastritis without mention of hemorrhage    hx of antral gastritis on EGD 05/2009  . Peripheral neuropathy    ? DPN?--per EMG 11/2012.  Intolerant of gabapentin, failed pamelor.  Radiofrequency neurotomy 12/11/14 helped LB pain (Dr. Nelva Bush)  . Pneumonia   . Recurrent UTI    on daily antibiotic prophylaxis by her urologist.  . RLS (restless legs syndrome)   . Sleep apnea   . Tarsal tunnel syndrome   . Unspecified venous (peripheral) insufficiency    Past Surgical History:  Procedure Laterality Date  . ANKLE SURGERY     bilateral  . CARDIAC CATHETERIZATION  09/2011   Nonobstructive CAD  . CARDIOVASCULAR STRESS TEST  01/04/2016   Nuclear medicine stress test: NORMAL  . CARDIOVASCULAR STRESS TEST  01/04/2016   Normal stress nuclear study with no ischemia or infarction; EF 82 with normal wall motion  . CATARACT EXTRACTION    . COLONOSCOPY  2002/2005, 11/14/06, and 05/2009   polyps 2002 but none 2005 or 2008 or 2011.  Repeat 08/17/16 for diarrhea: one diminutive adenoma + small polyps removed, diverticulosis noted--no further colonoscopies needed (Dr. Carlean Purl).  Marland Kitchen DEXA  10/2016   NORMAL--consider repeat 2 yrs.  . ESOPHAGOGASTRODUODENOSCOPY  05/2009   Moderate antral gastritis  . FOOT SURGERY     R foot bunionectomy and arthroplasty of digits R foot.  Arthroplasty digits 3 and 4 L foot.  Marland Kitchen HAMMER TOE SURGERY     bilateral  . left tarsal tunnel release     sept '11 (Dr Beola Cord)  . PARTIAL KNEE ARTHROPLASTY Right 11/06/2016   Procedure: UNICOMPARTMENTAL RIGHT KNEE- Medially;  Surgeon: Paralee Cancel, MD;  Location: [facility];  Service:  Orthopedics;  Laterality: Right;  90 mins  . radiofrequency neurotomy  06/18/2015   bilat L3-4 medial branch nerve and bilat L5 dorsal ramus nerve.(Dr. Nelva Bush)  . TONSILLECTOMY    . TOTAL ABDOMINAL HYSTERECTOMY W/ BILATERAL SALPINGOOPHORECTOMY    . TRANSTHORACIC ECHOCARDIOGRAM  08/2011; 2017   2013 Grade I DD; 2017 EF 60-65%, normal wall motion, grd II DD.  . unicompartmental right knee     11/06/16 Dr. Alvan Dame   Family History  Problem Relation Age of Onset  . Cancer Mother        Breast  . Breast cancer Mother   . Hypertension Father   . Hyperlipidemia Father   . Diabetes  Maternal Grandfather   . Mental illness Brother   . Colon cancer Neg Hx    Social History   Socioeconomic History  . Marital status: Married    Spouse name: Not on file  . Number of children: Not on file  . Years of education: Not on file  . Highest education level: Not on file  Occupational History  . Not on file  Social Needs  . Financial resource strain: Not on file  . Food insecurity:    Worry: Not on file    Inability: Not on file  . Transportation needs:    Medical: Not on file    Non-medical: Not on file  Tobacco Use  . Smoking status: Former Smoker    Packs/day: 1.00    Years: 35.00    Pack years: 35.00    Types: Cigarettes    Last attempt to quit: 03/27/2002    Years since quitting: 15.4  . Smokeless tobacco: Never Used  Substance and Sexual Activity  . Alcohol use: No    Alcohol/week: 0.0 oz  . Drug use: No  . Sexual activity: Not Currently  Lifestyle  . Physical activity:    Days per week: Not on file    Minutes per session: Not on file  . Stress: Not on file  Relationships  . Social connections:    Talks on phone: Not on file    Gets together: Not on file    Attends religious service: Not on file    Active member of club or organization: Not on file    Attends meetings of clubs or organizations: Not on file    Relationship status: Not on file  Other Topics Concern  . Not on  file  Social History Narrative   Married '57   3 sons- '60, '61, '64, grandchildren 64 (7 girls, 1 boy)   Lives independently with husband   Patient is a former smoker: quit 2004.   Alcohol use- yes socially not in years.   Illicit Drug use- no   Occupation: Biomedical scientist    Outpatient Encounter Medications as of 09/14/2017  Medication Sig  . albuterol (PROVENTIL HFA;VENTOLIN HFA) 108 (90 Base) MCG/ACT inhaler Inhale 1 puff into the lungs every 6 (six) hours as needed for wheezing or shortness of breath.  Marland Kitchen aspirin 81 MG chewable tablet Chew 1 tablet (81 mg total) by mouth 2 (two) times daily. for 4 weeks post-operative  . atorvastatin (LIPITOR) 20 MG tablet TAKE ONE TABLET BY MOUTH DAILY  . Calcium Carbonate (CALCIUM 600 PO) Take 1 tablet by mouth daily.  . celecoxib (CELEBREX) 200 MG capsule Take 1 capsule (200 mg total) by mouth daily.  . cyclopentolate (CYCLODRYL,CYCLOGYL) 1 % ophthalmic solution cyclopentolate 1 % eye drops  PLACE ONE DROP IN THE RIGHT EYE THREE TIMES DAILY  . diclofenac sodium (VOLTAREN) 1 % GEL APPLY 2 GRAM TO THE AFFECTED AREA(S) BY TOPICAL ROUTE 4 TIMES PER DAY  . dicyclomine (BENTYL) 10 MG capsule Take 1 tab 2-3 times daily for diarrhea/urgency.  . Difluprednate (DUREZOL) 0.05 % EMUL Durezol 0.05 % eye drops  ONE DROP IN THE RIGHT EYE FOUR TIMES DAILY  . DULoxetine (CYMBALTA) 60 MG capsule TAKE ONE CAPSULE BY MOUTH DAILY  . estradiol (ESTRACE) 1 MG tablet Take 1 tablet (1 mg total) by mouth daily.  . ferrous sulfate 325 (65 FE) MG tablet Take 1 tablet (325 mg total) by mouth 2 (two) times daily with a meal.  . furosemide (LASIX)  40 MG tablet Take 1 tablet as needed for swelling  . glucose blood test strip Use to check blood sugar once daily  . HYDROcodone-acetaminophen (NORCO) 7.5-325 MG tablet Take 1-2 tablets by mouth every 6 (six) hours.  . Lancets 30G MISC Use to check blood sugar once daily  . levothyroxine (SYNTHROID, LEVOTHROID) 25 MCG tablet TAKE  ONE TABLET BY MOUTH DAILY  . loratadine (CLARITIN) 10 MG tablet Take 10 mg by mouth daily as needed for allergies.  Marland Kitchen losartan-hydrochlorothiazide (HYZAAR) 50-12.5 MG tablet Take 1 tablet by mouth daily.  . metFORMIN (GLUCOPHAGE-XR) 500 MG 24 hr tablet TAKE TWO TABLETS BY MOUTH DAILY WITH BREAKFAST  . metoprolol tartrate (LOPRESSOR) 50 MG tablet TAKE 1 TABLET BY MOUTH TWICE A DAY  . mirtazapine (REMERON) 15 MG tablet Take 0.5 tablets (7.5 mg total) by mouth at bedtime.  . Multiple Vitamins-Minerals (MULTIVITAMIN ADULT PO) Take 1 tablet by mouth daily.   . Multiple Vitamins-Minerals (PRESERVISION AREDS 2 PO) Take by mouth.  Marland Kitchen omeprazole (PRILOSEC) 20 MG capsule TAKE ONE CAPSULE BY MOUTH DAILY  . oxybutynin (DITROPAN-XL) 10 MG 24 hr tablet Take 1 tablet by mouth daily.  . pioglitazone (ACTOS) 15 MG tablet TAKE ONE TABLET BY MOUTH DAILY  . potassium chloride SA (K-DUR,KLOR-CON) 20 MEQ tablet Take 1 tablet (20 mEq total) by mouth daily.  . pregabalin (LYRICA) 50 MG capsule Take 1 capsule (50 mg total) by mouth 2 (two) times daily.  Marland Kitchen tiZANidine (ZANAFLEX) 4 MG tablet Take 1 tablet by mouth every 8 (eight) hours as needed.  . ALPRAZolam (XANAX) 0.25 MG tablet Take 0.25 mg by mouth at bedtime as needed for anxiety.  Marland Kitchen loperamide (IMODIUM) 2 MG capsule Take 2 mg by mouth as needed for diarrhea or loose stools. Reported on 05/10/2015  . ondansetron (ZOFRAN) 4 MG tablet Take 1 tablet (4 mg total) by mouth every 8 (eight) hours as needed for nausea or vomiting (take before meals to try to prevent diatrrhea). (Patient not taking: Reported on 09/14/2017)   No facility-administered encounter medications on file as of 09/14/2017.     Activities of Daily Living In your present state of health, do you have any difficulty performing the following activities: 09/14/2017 11/06/2016  Hearing? N N  Vision? N N  Comment - -  Difficulty concentrating or making decisions? N N  Walking or climbing stairs? Y Y  Comment  Uses rail -  Dressing or bathing? N N  Doing errands, shopping? N N  Preparing Food and eating ? N -  Using the Toilet? N -  In the past six months, have you accidently leaked urine? Y -  Do you have problems with loss of bowel control? N -  Managing your Medications? N -  Managing your Finances? N -  Housekeeping or managing your Housekeeping? N -  Some recent data might be hidden    Patient Care Team: Tammi Sou, MD as PCP - General (Family Medicine) Lamonte Sakai Rose Fillers, MD (Pulmonary Disease) Fay Records, MD as Consulting Physician (Cardiology) Suella Broad, MD as Consulting Physician (Physical Medicine and Rehabilitation) Paralee Cancel, MD as Consulting Physician (Orthopedic Surgery) Kathie Rhodes, MD as Consulting Physician (Urology) Darleen Crocker, MD as Consulting Physician (Ophthalmology) Chesley Mires, MD as Consulting Physician (Pulmonary Disease) Gatha Mayer, MD as Consulting Physician (Gastroenterology) Susa Day, MD as Consulting Physician (Orthopedic Surgery) Lavonna Monarch, MD as Consulting Physician (Dermatology)    Assessment:   This is a routine wellness examination for Denice Paradise.  Exercise Activities and Dietary recommendations Current Exercise Habits: The patient does not participate in regular exercise at present, Exercise limited by: orthopedic condition(s)   Diet (meal preparation, eat out, water intake, caffeinated beverages, dairy products, fruits and vegetables): Drinks water.   Breakfast: cereal and half muffin Lunch: fast food  Dinner: protein and vegetables. Eats out most of the time.   Goals    . Weight (lb) < 180 lb (81.6 kg)     Lose weight by cutting back carbs.        Fall Risk Fall Risk  09/14/2017 07/10/2016 07/02/2015 09/02/2014  Falls in the past year? Yes Yes Yes No  Number falls in past yr: 2 or more 1 2 or more -  Injury with Fall? No No No -  Risk Factor Category  High Fall Risk - High Fall Risk -  Risk for fall due to :  Impaired balance/gait - History of fall(s) -  Risk for fall due to: Comment - - Her falls occur mainly when she gets up in night to use the bathroom.  Now uses a walker and this helps. -  Follow up Falls prevention discussed Falls prevention discussed Falls prevention discussed -    Depression Screen PHQ 2/9 Scores 09/14/2017 07/12/2017 04/13/2017 07/10/2016  PHQ - 2 Score 0 1 1 0  PHQ- 9 Score - 2 2 -     Cognitive Function MMSE - Mini Mental State Exam 09/14/2017  Orientation to time 5  Orientation to Place 5  Registration 3  Attention/ Calculation 5  Recall 3  Language- name 2 objects 2  Language- repeat 1  Language- follow 3 step command 3  Language- read & follow direction 1  Write a sentence 1  Copy design 1  Total score 30        Immunization History  Administered Date(s) Administered  . Influenza Split 12/06/2011  . Influenza Whole 01/25/2001, 01/08/2009, 01/25/2009, 12/01/2009, 11/26/2010, 11/26/2011  . Influenza, High Dose Seasonal PF 01/30/2013, 01/14/2014, 12/30/2014, 12/17/2015, 01/11/2017  . Pneumococcal Conjugate-13 09/02/2014  . Pneumococcal Polysaccharide-23 01/25/2001, 06/25/2008, 01/10/2012  . Pneumococcal-Unspecified 02/25/2016  . Tdap 06/14/2010  . Zoster 06/26/2007  . Zoster Recombinat (Shingrix) 11/15/2016, 04/13/2017    Screening Tests Health Maintenance  Topic Date Due  . INFLUENZA VACCINE  10/25/2017  . HEMOGLOBIN A1C  01/11/2018  . FOOT EXAM  04/13/2018  . OPHTHALMOLOGY EXAM  07/06/2018  . TETANUS/TDAP  06/13/2020  . DEXA SCAN  Completed  . PNA vac Low Risk Adult  Completed       Plan:     Schedule mammogram after 10/31/2017.   Bring a copy of your living will and/or healthcare power of attorney to your next office visit.  Continue doing brain stimulating activities (puzzles, reading, adult coloring books, staying active) to keep memory sharp.   I have personally reviewed and noted the following in the patient's chart:   . Medical  and social history . Use of alcohol, tobacco or illicit drugs  . Current medications and supplements . Functional ability and status . Nutritional status . Physical activity . Advanced directives . List of other physicians . Hospitalizations, surgeries, and ER visits in previous 12 months . Vitals . Screenings to include cognitive, depression, and falls . Referrals and appointments  In addition, I have reviewed and discussed with patient certain preventive protocols, quality metrics, and best practice recommendations. A written personalized care plan for preventive services as well as general preventive health recommendations were provided to patient.  Gerilyn Nestle, RN  09/14/2017

## 2017-09-14 NOTE — Patient Instructions (Addendum)
Schedule mammogram after 10/31/2017.   Bring a copy of your living will and/or healthcare power of attorney to your next office visit.  Continue doing brain stimulating activities (puzzles, reading, adult coloring books, staying active) to keep memory sharp.  Health Maintenance, Female Adopting a healthy lifestyle and getting preventive care can go a long way to promote health and wellness. Talk with your health care provider about what schedule of regular examinations is right for you. This is a good chance for you to check in with your provider about disease prevention and staying healthy. In between checkups, there are plenty of things you can do on your own. Experts have done a lot of research about which lifestyle changes and preventive measures are most likely to keep you healthy. Ask your health care provider for more information. Weight and diet Eat a healthy diet  Be sure to include plenty of vegetables, fruits, low-fat dairy products, and lean protein.  Do not eat a lot of foods high in solid fats, added sugars, or salt.  Get regular exercise. This is one of the most important things you can do for your health. ? Most adults should exercise for at least 150 minutes each week. The exercise should increase your heart rate and make you sweat (moderate-intensity exercise). ? Most adults should also do strengthening exercises at least twice a week. This is in addition to the moderate-intensity exercise.  Maintain a healthy weight  Body mass index (BMI) is a measurement that can be used to identify possible weight problems. It estimates body fat based on height and weight. Your health care provider can help determine your BMI and help you achieve or maintain a healthy weight.  For females 9 years of age and older: ? A BMI below 18.5 is considered underweight. ? A BMI of 18.5 to 24.9 is normal. ? A BMI of 25 to 29.9 is considered overweight. ? A BMI of 30 and above is considered  obese.  Watch levels of cholesterol and blood lipids  You should start having your blood tested for lipids and cholesterol at 82 years of age, then have this test every 5 years.  You may need to have your cholesterol levels checked more often if: ? Your lipid or cholesterol levels are high. ? You are older than 82 years of age. ? You are at high risk for heart disease.  Cancer screening Lung Cancer  Lung cancer screening is recommended for adults 48-56 years old who are at high risk for lung cancer because of a history of smoking.  A yearly low-dose CT scan of the lungs is recommended for people who: ? Currently smoke. ? Have quit within the past 15 years. ? Have at least a 30-pack-year history of smoking. A pack year is smoking an average of one pack of cigarettes a day for 1 year.  Yearly screening should continue until it has been 15 years since you quit.  Yearly screening should stop if you develop a health problem that would prevent you from having lung cancer treatment.  Breast Cancer  Practice breast self-awareness. This means understanding how your breasts normally appear and feel.  It also means doing regular breast self-exams. Let your health care provider know about any changes, no matter how small.  If you are in your 20s or 30s, you should have a clinical breast exam (CBE) by a health care provider every 1-3 years as part of a regular health exam.  If you are 40 or  or older, have a CBE every year. Also consider having a breast X-ray (mammogram) every year.  If you have a family history of breast cancer, talk to your health care provider about genetic screening.  If you are at high risk for breast cancer, talk to your health care provider about having an MRI and a mammogram every year.  Breast cancer gene (BRCA) assessment is recommended for women who have family members with BRCA-related cancers. BRCA-related cancers  include: ? Breast. ? Ovarian. ? Tubal. ? Peritoneal cancers.  Results of the assessment will determine the need for genetic counseling and BRCA1 and BRCA2 testing.  Cervical Cancer Your health care provider may recommend that you be screened regularly for cancer of the pelvic organs (ovaries, uterus, and vagina). This screening involves a pelvic examination, including checking for microscopic changes to the surface of your cervix (Pap test). You may be encouraged to have this screening done every 3 years, beginning at age 21.  For women ages 30-65, health care providers may recommend pelvic exams and Pap testing every 3 years, or they may recommend the Pap and pelvic exam, combined with testing for human papilloma virus (HPV), every 5 years. Some types of HPV increase your risk of cervical cancer. Testing for HPV may also be done on women of any age with unclear Pap test results.  Other health care providers may not recommend any screening for nonpregnant women who are considered low risk for pelvic cancer and who do not have symptoms. Ask your health care provider if a screening pelvic exam is right for you.  If you have had past treatment for cervical cancer or a condition that could lead to cancer, you need Pap tests and screening for cancer for at least 20 years after your treatment. If Pap tests have been discontinued, your risk factors (such as having a new sexual partner) need to be reassessed to determine if screening should resume. Some women have medical problems that increase the chance of getting cervical cancer. In these cases, your health care provider may recommend more frequent screening and Pap tests.  Colorectal Cancer  This type of cancer can be detected and often prevented.  Routine colorectal cancer screening usually begins at 82 years of age and continues through 82 years of age.  Your health care provider may recommend screening at an earlier age if you have risk factors  for colon cancer.  Your health care provider may also recommend using home test kits to check for hidden blood in the stool.  A small camera at the end of a tube can be used to examine your colon directly (sigmoidoscopy or colonoscopy). This is done to check for the earliest forms of colorectal cancer.  Routine screening usually begins at age 50.  Direct examination of the colon should be repeated every 5-10 years through 82 years of age. However, you may need to be screened more often if early forms of precancerous polyps or small growths are found.  Skin Cancer  Check your skin from head to toe regularly.  Tell your health care provider about any new moles or changes in moles, especially if there is a change in a mole's shape or color.  Also tell your health care provider if you have a mole that is larger than the size of a pencil eraser.  Always use sunscreen. Apply sunscreen liberally and repeatedly throughout the day.  Protect yourself by wearing long sleeves, pants, a wide-brimmed hat, and sunglasses whenever you are   outside.  Heart disease, diabetes, and high blood pressure  High blood pressure causes heart disease and increases the risk of stroke. High blood pressure is more likely to develop in: ? People who have blood pressure in the high end of the normal range (130-139/85-89 mm Hg). ? People who are overweight or obese. ? People who are African American.  If you are 18-39 years of age, have your blood pressure checked every 3-5 years. If you are 40 years of age or older, have your blood pressure checked every year. You should have your blood pressure measured twice-once when you are at a hospital or clinic, and once when you are not at a hospital or clinic. Record the average of the two measurements. To check your blood pressure when you are not at a hospital or clinic, you can use: ? An automated blood pressure machine at a pharmacy. ? A home blood pressure monitor.  If  you are between 55 years and 79 years old, ask your health care provider if you should take aspirin to prevent strokes.  Have regular diabetes screenings. This involves taking a blood sample to check your fasting blood sugar level. ? If you are at a normal weight and have a low risk for diabetes, have this test once every three years after 82 years of age. ? If you are overweight and have a high risk for diabetes, consider being tested at a younger age or more often. Preventing infection Hepatitis B  If you have a higher risk for hepatitis B, you should be screened for this virus. You are considered at high risk for hepatitis B if: ? You were born in a country where hepatitis B is common. Ask your health care provider which countries are considered high risk. ? Your parents were born in a high-risk country, and you have not been immunized against hepatitis B (hepatitis B vaccine). ? You have HIV or AIDS. ? You use needles to inject street drugs. ? You live with someone who has hepatitis B. ? You have had sex with someone who has hepatitis B. ? You get hemodialysis treatment. ? You take certain medicines for conditions, including cancer, organ transplantation, and autoimmune conditions.  Hepatitis C  Blood testing is recommended for: ? Everyone born from 1945 through 1965. ? Anyone with known risk factors for hepatitis C.  Sexually transmitted infections (STIs)  You should be screened for sexually transmitted infections (STIs) including gonorrhea and chlamydia if: ? You are sexually active and are younger than 82 years of age. ? You are older than 82 years of age and your health care provider tells you that you are at risk for this type of infection. ? Your sexual activity has changed since you were last screened and you are at an increased risk for chlamydia or gonorrhea. Ask your health care provider if you are at risk.  If you do not have HIV, but are at risk, it may be recommended  that you take a prescription medicine daily to prevent HIV infection. This is called pre-exposure prophylaxis (PrEP). You are considered at risk if: ? You are sexually active and do not regularly use condoms or know the HIV status of your partner(s). ? You take drugs by injection. ? You are sexually active with a partner who has HIV.  Talk with your health care provider about whether you are at high risk of being infected with HIV. If you choose to begin PrEP, you should first be tested   for HIV. You should then be tested every 3 months for as long as you are taking PrEP. Pregnancy  If you are premenopausal and you may become pregnant, ask your health care provider about preconception counseling.  If you may become pregnant, take 400 to 800 micrograms (mcg) of folic acid every day.  If you want to prevent pregnancy, talk to your health care provider about birth control (contraception). Osteoporosis and menopause  Osteoporosis is a disease in which the bones lose minerals and strength with aging. This can result in serious bone fractures. Your risk for osteoporosis can be identified using a bone density scan.  If you are 65 years of age or older, or if you are at risk for osteoporosis and fractures, ask your health care provider if you should be screened.  Ask your health care provider whether you should take a calcium or vitamin D supplement to lower your risk for osteoporosis.  Menopause may have certain physical symptoms and risks.  Hormone replacement therapy may reduce some of these symptoms and risks. Talk to your health care provider about whether hormone replacement therapy is right for you. Follow these instructions at home:  Schedule regular health, dental, and eye exams.  Stay current with your immunizations.  Do not use any tobacco products including cigarettes, chewing tobacco, or electronic cigarettes.  If you are pregnant, do not drink alcohol.  If you are  breastfeeding, limit how much and how often you drink alcohol.  Limit alcohol intake to no more than 1 drink per day for nonpregnant women. One drink equals 12 ounces of beer, 5 ounces of wine, or 1 ounces of hard liquor.  Do not use street drugs.  Do not share needles.  Ask your health care provider for help if you need support or information about quitting drugs.  Tell your health care provider if you often feel depressed.  Tell your health care provider if you have ever been abused or do not feel safe at home. This information is not intended to replace advice given to you by your health care provider. Make sure you discuss any questions you have with your health care provider. Document Released: 09/26/2010 Document Revised: 08/19/2015 Document Reviewed: 12/15/2014 Elsevier Interactive Patient Education  2018 [employer]  

## 2017-09-16 NOTE — Progress Notes (Signed)
AWV reviewed and agree. Signed:  Crissie Sickles, MD           09/16/2017

## 2017-09-17 ENCOUNTER — Other Ambulatory Visit: Payer: Self-pay | Admitting: Family Medicine

## 2017-09-17 DIAGNOSIS — I10 Essential (primary) hypertension: Secondary | ICD-10-CM

## 2017-09-17 DIAGNOSIS — Z1231 Encounter for screening mammogram for malignant neoplasm of breast: Secondary | ICD-10-CM

## 2017-09-17 NOTE — Telephone Encounter (Signed)
RF request for metformin LOV: 07/12/17 Next ov: 10/10/17 Last written: 03/22/17 #90 w/ 1RF  RF request for metoprolol Last written: 03/22/17 #180 w/ 1RF  RF request for losartan/hctz Last written: 03/22/17 #90 w/ 1RF

## 2017-10-09 DIAGNOSIS — H35341 Macular cyst, hole, or pseudohole, right eye: Secondary | ICD-10-CM | POA: Diagnosis not present

## 2017-10-10 ENCOUNTER — Telehealth: Payer: Self-pay

## 2017-10-10 ENCOUNTER — Encounter: Payer: Self-pay | Admitting: Family Medicine

## 2017-10-10 ENCOUNTER — Encounter: Payer: Self-pay | Admitting: *Deleted

## 2017-10-10 ENCOUNTER — Ambulatory Visit (INDEPENDENT_AMBULATORY_CARE_PROVIDER_SITE_OTHER): Payer: Medicare Other | Admitting: Family Medicine

## 2017-10-10 VITALS — BP 105/66 | HR 62 | Temp 97.6°F | Resp 16 | Ht 62.0 in | Wt 195.0 lb

## 2017-10-10 DIAGNOSIS — J069 Acute upper respiratory infection, unspecified: Secondary | ICD-10-CM

## 2017-10-10 DIAGNOSIS — I10 Essential (primary) hypertension: Secondary | ICD-10-CM

## 2017-10-10 DIAGNOSIS — E7800 Pure hypercholesterolemia, unspecified: Secondary | ICD-10-CM | POA: Diagnosis not present

## 2017-10-10 DIAGNOSIS — E669 Obesity, unspecified: Secondary | ICD-10-CM

## 2017-10-10 DIAGNOSIS — E118 Type 2 diabetes mellitus with unspecified complications: Secondary | ICD-10-CM

## 2017-10-10 DIAGNOSIS — K580 Irritable bowel syndrome with diarrhea: Secondary | ICD-10-CM

## 2017-10-10 LAB — COMPREHENSIVE METABOLIC PANEL
ALT: 25 U/L (ref 0–35)
AST: 20 U/L (ref 0–37)
Albumin: 4.1 g/dL (ref 3.5–5.2)
Alkaline Phosphatase: 101 U/L (ref 39–117)
BUN: 19 mg/dL (ref 6–23)
CHLORIDE: 98 meq/L (ref 96–112)
CO2: 31 meq/L (ref 19–32)
CREATININE: 0.83 mg/dL (ref 0.40–1.20)
Calcium: 9.5 mg/dL (ref 8.4–10.5)
GFR: 70.02 mL/min (ref >60.00)
Glucose, Bld: 135 mg/dL — ABNORMAL HIGH (ref 70–99)
POTASSIUM: 4.2 meq/L (ref 3.5–5.1)
Sodium: 139 mEq/L (ref 135–145)
Total Bilirubin: 0.4 mg/dL (ref 0.2–1.2)
Total Protein: 6.5 g/dL (ref 6.0–8.3)

## 2017-10-10 LAB — HEMOGLOBIN A1C: HEMOGLOBIN A1C: 6.8 % — AB (ref 4.6–6.5)

## 2017-10-10 MED ORDER — ALBUTEROL SULFATE HFA 108 (90 BASE) MCG/ACT IN AERS: 2.0000 | INHALATION_SPRAY | Freq: Four times a day (QID) | RESPIRATORY_TRACT | 1 refills | Status: DC | PRN

## 2017-10-10 MED ORDER — ALBUTEROL SULFATE HFA 108 (90 BASE) MCG/ACT IN AERS: 1.0000 | INHALATION_SPRAY | Freq: Four times a day (QID) | RESPIRATORY_TRACT | 0 refills | Status: DC | PRN

## 2017-10-10 NOTE — Progress Notes (Signed)
OFFICE VISIT  10/10/2017   CC:  Chief Complaint  Patient presents with  . Follow-up    RCI, pt is not fasting.    HPI:    Patient is a 84 y.o. Caucasian female who presents for f/u DM 2, HTN, HLD.  DM: avg fasting 150, was 138 this morning.  No monitoring later in the day. No hypoglycemia.  HTN: avg low 130s/70 avg--wrist cuff.  Not monitoring HR.  HLD: takes her atorva 20mg  w/out side effect.  ROS: chronic LBP, causes leg fatigue easily.  IBS better lately on bentyl bid. Has had a couple days of nasal congestion with some PND.  No cough, no fever, no face pain, no SOB or wheezing.  Past Medical History:  Diagnosis Date  . Abdominal pain, right lower quadrant   . Bursitis   . Cataracts, both eyes    soon to have cataract surg as of 03/2015  . Diabetes mellitus with complication (HCC)    DPN.  NO diab retinopathy as of 06/2017 eye exam.  . Diabetic peripheral neuropathy associated with type 2 diabetes mellitus ([place])   . Diverticulosis of colon    noted on colonoscopy 2008  . Dyspnea    a. 09/2011 CTA Chest: No PE, Ca2+ cors;  b. 09/2011 R&L heart Cath: Relatively nl R heart pressures, nl co/ci, nonobs cad, nl EF.  12/2015 stress test normal, echo with grd II DD--likely explains DOE.  Marland Kitchen External hemorrhoid   . Fatigue    w/ ? excessive daytime somnolence; ? OSA--eval by Dr. Halford Chessman 06/2016, home sleep study ordered.  . Former smoker    30 pack-yrs, quit 2005  . Heart murmur    hx of  . Hot flashes    restarted estradiol 01/2015  . Hx of adenomatous polyp of colon 08/25/2016   07/2016 no recall  . Hyperlipemia, mixed    trigs remain elevated (200s-300s range), but LDL at goal.  . Hypertension   . Hypothyroidism   . IBS (irritable bowel syndrome)    IBS-D (Dr. Carlean Purl) + ? worsened by GI side effect of metformin  . Interstitial cystitis   . Liver function study, abnormal   . Lumbar degenerative disc disease    Facet-mediated pain per Dr. Nelva Bush.  Has gotten repeated LB  injections, most recently April 2016 in [place].  Eval by Dr. Tonita Cong 03/2017.  Spinal stenosis without neurogenic claudication.  Arthropathy of lumbar facet jt: pt pursuing radiofrequency rhizotomy as of 04/23/17 ortho f/u  . Lumbar scoliosis    lumbosacral spondylosis w/out myelopathy (radiofrequency ablation/lesioning procedure planned for 05/2017).  . Macular degeneration    OS exudative (avastin treatments).  Advanced nonexudative OD, monitor.  AREDS  . Macular hole 07/05/2017   OD; surgical intervention recommended  . Menopausal syndrome   . Morton's neuroma   . Obesity, Class II, BMI 35-39.9   . OSA on CPAP 07/2016   HST 07/27/16 >> AHI 6.2, SaO2 low 79%.  CPAP recommended by pulm  . Other specified gastritis without mention of hemorrhage    hx of antral gastritis on EGD 05/2009  . Peripheral neuropathy    ? DPN?--per EMG 11/2012.  Intolerant of gabapentin, failed pamelor.  Radiofrequency neurotomy 12/11/14 helped LB pain (Dr. Nelva Bush)  . Pneumonia   . Recurrent UTI    on daily antibiotic prophylaxis by her urologist.  . RLS (restless legs syndrome)   . Sleep apnea   . Tarsal tunnel syndrome   . Unspecified venous (peripheral) insufficiency  Past Surgical History:  Procedure Laterality Date  . ANKLE SURGERY     bilateral  . CARDIAC CATHETERIZATION  09/2011   Nonobstructive CAD  . CARDIOVASCULAR STRESS TEST  01/04/2016   Nuclear medicine stress test: NORMAL  . CARDIOVASCULAR STRESS TEST  01/04/2016   Normal stress nuclear study with no ischemia or infarction; EF 82 with normal wall motion  . CATARACT EXTRACTION    . COLONOSCOPY  2002/2005, 11/14/06, and 05/2009   polyps 2002 but none 2005 or 2008 or 2011.  Repeat 08/17/16 for diarrhea: one diminutive adenoma + small polyps removed, diverticulosis noted--no further colonoscopies needed (Dr. Carlean Purl).  Marland Kitchen DEXA  10/2016   NORMAL--consider repeat 2 yrs.  . ESOPHAGOGASTRODUODENOSCOPY  05/2009   Moderate antral gastritis  . FOOT SURGERY      R foot bunionectomy and arthroplasty of digits R foot.  Arthroplasty digits 3 and 4 L foot.  Marland Kitchen HAMMER TOE SURGERY     bilateral  . left tarsal tunnel release     sept '11 (Dr Beola Cord)  . PARTIAL KNEE ARTHROPLASTY Right 11/06/2016   Procedure: UNICOMPARTMENTAL RIGHT KNEE- Medially;  Surgeon: Paralee Cancel, MD;  Location: [facility];  Service: Orthopedics;  Laterality: Right;  90 mins  . radiofrequency neurotomy  06/18/2015   bilat L3-4 medial branch nerve and bilat L5 dorsal ramus nerve.(Dr. Nelva Bush)  . TONSILLECTOMY    . TOTAL ABDOMINAL HYSTERECTOMY W/ BILATERAL SALPINGOOPHORECTOMY    . TRANSTHORACIC ECHOCARDIOGRAM  08/2011; 2017   2013 Grade I DD; 2017 EF 60-65%, normal wall motion, grd II DD.  . unicompartmental right knee     11/06/16 Dr. Alvan Dame    Outpatient Medications Prior to Visit  Medication Sig Dispense Refill  . ALPRAZolam (XANAX) 0.25 MG tablet Take 0.25 mg by mouth at bedtime as needed for anxiety.    Marland Kitchen aspirin 81 MG chewable tablet Chew 1 tablet (81 mg total) by mouth 2 (two) times daily. for 4 weeks post-operative 60 tablet 0  . atorvastatin (LIPITOR) 20 MG tablet TAKE ONE TABLET BY MOUTH DAILY 90 tablet 1  . Calcium Carbonate (CALCIUM 600 PO) Take 1 tablet by mouth daily.    . celecoxib (CELEBREX) 200 MG capsule Take 1 capsule (200 mg total) by mouth daily. 90 capsule 1  . diclofenac sodium (VOLTAREN) 1 % GEL APPLY 2 GRAM TO THE AFFECTED AREA(S) BY TOPICAL ROUTE 4 TIMES PER DAY  1  . dicyclomine (BENTYL) 10 MG capsule Take 1 tab 2-3 times daily for diarrhea/urgency. 90 capsule 6  . Difluprednate (DUREZOL) 0.05 % EMUL Durezol 0.05 % eye drops  ONE DROP IN THE RIGHT EYE FOUR TIMES DAILY    . DULoxetine (CYMBALTA) 60 MG capsule TAKE ONE CAPSULE BY MOUTH DAILY 90 capsule 1  . estradiol (ESTRACE) 1 MG tablet Take 1 tablet (1 mg total) by mouth daily. 90 tablet 3  . ferrous sulfate 325 (65 FE) MG tablet Take 1 tablet (325 mg total) by mouth 2 (two) times daily with a meal. 30 tablet 0  .  furosemide (LASIX) 40 MG tablet Take 1 tablet as needed for swelling 90 tablet 1  . glucose blood test strip Use to check blood sugar once daily 100 each 11  . HYDROcodone-acetaminophen (NORCO) 7.5-325 MG tablet Take 1-2 tablets by mouth every 6 (six) hours. 60 tablet 0  . Lancets 30G MISC Use to check blood sugar once daily 100 each 11  . levothyroxine (SYNTHROID, LEVOTHROID) 25 MCG tablet TAKE ONE TABLET BY MOUTH DAILY 90  tablet 1  . loratadine (CLARITIN) 10 MG tablet Take 10 mg by mouth daily as needed for allergies.    Marland Kitchen losartan-hydrochlorothiazide (HYZAAR) 50-12.5 MG tablet TAKE ONE TABLET BY MOUTH DAILY 90 tablet 1  . metFORMIN (GLUCOPHAGE-XR) 500 MG 24 hr tablet TAKE TWO TABLETS BY MOUTH DAILY WITH BREAKFAST 180 tablet 1  . metoprolol tartrate (LOPRESSOR) 50 MG tablet TAKE ONE TABLET BY MOUTH TWICE DAILY 180 tablet 1  . mirtazapine (REMERON) 15 MG tablet Take 0.5 tablets (7.5 mg total) by mouth at bedtime. 90 tablet 1  . Multiple Vitamins-Minerals (MULTIVITAMIN ADULT PO) Take 1 tablet by mouth daily.     . Multiple Vitamins-Minerals (PRESERVISION AREDS 2 PO) Take by mouth.    Marland Kitchen omeprazole (PRILOSEC) 20 MG capsule TAKE ONE CAPSULE BY MOUTH DAILY 90 capsule 1  . oxybutynin (DITROPAN-XL) 10 MG 24 hr tablet Take 1 tablet by mouth daily.    . pioglitazone (ACTOS) 15 MG tablet TAKE ONE TABLET BY MOUTH DAILY 90 tablet 1  . potassium chloride SA (K-DUR,KLOR-CON) 20 MEQ tablet Take 1 tablet (20 mEq total) by mouth daily. 90 tablet 3  . pregabalin (LYRICA) 50 MG capsule Take 1 capsule (50 mg total) by mouth 2 (two) times daily. 180 capsule 3  . tiZANidine (ZANAFLEX) 4 MG tablet Take 1 tablet by mouth every 8 (eight) hours as needed.  1  . albuterol (PROVENTIL HFA;VENTOLIN HFA) 108 (90 Base) MCG/ACT inhaler Inhale 1 puff into the lungs every 6 (six) hours as needed for wheezing or shortness of breath. 6.7 g 0  . cyclopentolate (CYCLODRYL,CYCLOGYL) 1 % ophthalmic solution cyclopentolate 1 % eye  drops  PLACE ONE DROP IN THE RIGHT EYE THREE TIMES DAILY    . loperamide (IMODIUM) 2 MG capsule Take 2 mg by mouth as needed for diarrhea or loose stools. Reported on 05/10/2015    . ondansetron (ZOFRAN) 4 MG tablet Take 1 tablet (4 mg total) by mouth every 8 (eight) hours as needed for nausea or vomiting (take before meals to try to prevent diatrrhea). (Patient not taking: Reported on 09/14/2017) 30 tablet 1   No facility-administered medications prior to visit.     Allergies  Allergen Reactions  . Gabapentin Itching  . Sulfonamide Derivatives     REACTION: Nausea    ROS As per HPI  PE: Blood pressure 105/66, pulse 62, temperature 97.6 F (36.4 C), temperature source Oral, resp. rate 16, height 5\' 2"  (1.575 m), weight 195 lb (88.5 kg), SpO2 96 %. Body mass index is 35.67 kg/m.  VS: noted--normal. Gen: alert, NAD, NONTOXIC APPEARING. HEENT: eyes without injection, drainage, or swelling.  Ears: EACs clear, TMs with normal light reflex and landmarks.  Nose: Clear rhinorrhea, with some dried, crusty exudate adherent to mildly injected mucosa.  No purulent d/c.  No paranasal sinus TTP.  No facial swelling.  Throat and mouth without focal lesion.  No pharyngial swelling, erythema, or exudate.   Neck: supple, no LAD.   LUNGS: CTA bilat, nonlabored resps.   CV: RRR, no m/r/g. EXT: no clubbing or cyanosis.  1-2+ pitting edema in both LL's.  Diminished fine touch sensation from mid tibia level down into feet bilat (hx of DPN). SKIN: no rash    LABS:  Lab Results  Component Value Date   TSH 2.34 07/12/2017   Lab Results  Component Value Date   WBC 11.2 (H) 01/11/2017   HGB 11.7 (L) 01/11/2017   HCT 35.9 (L) 01/11/2017   MCV 95.7 01/11/2017  PLT 340.0 01/11/2017   Lab Results  Component Value Date   IRON 78 01/11/2017   IRON 82 01/11/2017   TIBC 403 01/11/2017   FERRITIN 21.8 01/11/2017    Lab Results  Component Value Date   CREATININE 0.72 04/17/2017   BUN 18 04/17/2017    NA 140 04/17/2017   K 4.2 04/17/2017   CL 101 04/17/2017   CO2 28 04/17/2017   Lab Results  Component Value Date   ALT 22 03/09/2016   AST 16 03/09/2016   ALKPHOS 81 03/09/2016   BILITOT 0.4 03/09/2016   Lab Results  Component Value Date   CHOL 154 04/17/2017   Lab Results  Component Value Date   HDL 46 (L) 04/17/2017   Lab Results  Component Value Date   LDLCALC 68 04/17/2017   Lab Results  Component Value Date   TRIG 334 (H) 04/17/2017   Lab Results  Component Value Date   CHOLHDL 3.3 04/17/2017   Lab Results  Component Value Date   HGBA1C 6.6 (H) 07/12/2017    IMPRESSION AND PLAN:  1) DM 2: historically well controlled. A1c and CMET today. No med changes at this time.  2) HTN: The current medical regimen is effective;  continue present plan and medications. Lytes/cr today.  3) Hypercholesterolemia: FLP 6 mo ago showed LDL 68.  Tolerating 20mg  qd atorvastatin. No changes today. Recheck FLP 6 mo.  4) Viral URI: mild.  She does not need any meds for this at this time.  5) IBS: stable/improved on bentyl bid per GI recommendations.  An After Visit Summary was printed and given to the patient.  FOLLOW UP: Return in about 3 months (around 01/10/2018) for routine chronic illness f/u.  Signed:  Crissie Sickles, MD           10/10/2017

## 2017-10-10 NOTE — Telephone Encounter (Signed)
Copied from [place]. Topic: General - Other >> Oct 10, 2017 11:20 AM Yvette Rack wrote: Reason for CRM: April from [facility], [place], [place] [address] [phone] calling wanting the provider to give her permission to change the RX albuterol (PROVENTIL HFA;VENTOLIN HFA) 108 (90 Base) MCG/ACT inhaler  to may use inhaler 2 puffs as needed for a 30 supply  pharmacy states that it will cost pt 2 co pays if left how the provider wrote it

## 2017-10-10 NOTE — Telephone Encounter (Signed)
OK, new albut rx sent.

## 2017-10-10 NOTE — Telephone Encounter (Signed)
According to directions written, prescription is for 50 days which will have patient paying 2 copays. Pharmacist asking if you can add May do 2 puffs a day if needed in addition to original directions. This way patient will only have one copay.

## 2017-10-16 ENCOUNTER — Ambulatory Visit (INDEPENDENT_AMBULATORY_CARE_PROVIDER_SITE_OTHER): Payer: Medicare Other | Admitting: Internal Medicine

## 2017-10-16 ENCOUNTER — Encounter: Payer: Self-pay | Admitting: Internal Medicine

## 2017-10-16 VITALS — BP 140/72 | HR 64 | Ht 62.0 in | Wt 194.8 lb

## 2017-10-16 DIAGNOSIS — K580 Irritable bowel syndrome with diarrhea: Secondary | ICD-10-CM | POA: Diagnosis not present

## 2017-10-16 NOTE — Progress Notes (Signed)
   Katherine Best 84 y.o. May 11, 1933 [id number]  Assessment & Plan:   Encounter Diagnosis  Name Primary?  . Irritable bowel syndrome with diarrhea Yes      Continue as needed low-dose dicyclomine.  Even though she is 81 she tolerates this she is been through a host of other treatments in the past.  She does not have pancreatic insufficiency.  I will see her as needed.  We can refill her dicyclomine as needed.  I appreciate the opportunity to care for this patient. CC: [name], [name], MD    Subjective:   Chief Complaint: Diarrhea and IBS  HPI Mechele Claude is here for follow-up with her husband, she had seen Amy Esterwood back in February and was having pretty bad diarrhea.  She was prescribed dicyclomine 10 mg 3 times daily which she took for a while when she is back that often now taking it as needed and is satisfied with her quality of life.  Stools are soft but controllable and no incontinence without fear of leaving the house or knowing where bathrooms are etc.  She recently saw primary care and got a good report her hemoglobin A1c was 6.8%.  She had a pancreatic elastase that was normal.   Medications, allergies, past medical history, past surgical history, family history and social history are reviewed and updated in the EMR.  Review of Systems As per HPI.  She has some stress her husband who is here with her today as a colonoscopy tomorrow because of an abnormal sigmoid colon on the CT scan he also has abnormal LFTs and questionable cholangiocarcinoma based upon imaging.  I did review this some with him today.  Objective:   Physical Exam BP 140/72   Pulse 64   Ht 5\' 2"  (1.575 m)   Wt 194 lb 12.8 oz (88.4 kg)   BMI 35.63 kg/m  No acute distress

## 2017-10-16 NOTE — Patient Instructions (Addendum)
  Glad your doing well, follow up with Dr Carlean Purl as needed.     I appreciate the opportunity to care for you. Silvano Rusk, MD, [facility]

## 2017-10-22 ENCOUNTER — Encounter: Payer: Self-pay | Admitting: Family Medicine

## 2017-10-22 ENCOUNTER — Ambulatory Visit: Payer: Medicare Other | Admitting: Internal Medicine

## 2017-10-23 DIAGNOSIS — L03031 Cellulitis of right toe: Secondary | ICD-10-CM | POA: Diagnosis not present

## 2017-10-25 ENCOUNTER — Other Ambulatory Visit: Payer: Self-pay

## 2017-10-28 ENCOUNTER — Encounter: Payer: Self-pay | Admitting: Family Medicine

## 2017-10-30 ENCOUNTER — Encounter: Payer: Self-pay | Admitting: Family Medicine

## 2017-10-30 DIAGNOSIS — L03031 Cellulitis of right toe: Secondary | ICD-10-CM | POA: Diagnosis not present

## 2017-10-30 DIAGNOSIS — M79674 Pain in right toe(s): Secondary | ICD-10-CM | POA: Diagnosis not present

## 2017-10-30 DIAGNOSIS — E1351 Other specified diabetes mellitus with diabetic peripheral angiopathy without gangrene: Secondary | ICD-10-CM | POA: Diagnosis not present

## 2017-10-31 ENCOUNTER — Ambulatory Visit
Admission: RE | Admit: 2017-10-31 | Discharge: 2017-10-31 | Disposition: A | Discharge status: Home / Self Care | Payer: Medicare Other | Source: Ambulatory Visit | Attending: Family Medicine | Admitting: Family Medicine

## 2017-10-31 DIAGNOSIS — Z1231 Encounter for screening mammogram for malignant neoplasm of breast: Secondary | ICD-10-CM

## 2017-10-31 NOTE — Telephone Encounter (Signed)
SW patient regarding symptoms.  Patient reports she becomes fatigued easily with occasional SOB x 1 month. Symptoms resolve once she sits/rest. Denies chest pain, diaphoresis or fever. States she does have a dry cough.  Pt scheduled to see PCP 11/01/17, advised if symptoms worsen, to go to the Emergency Department. Patient verbalized understanding.

## 2017-10-31 NOTE — Telephone Encounter (Signed)
Office visit to discuss this further is a good idea.

## 2017-11-01 ENCOUNTER — Encounter: Payer: Self-pay | Admitting: Family Medicine

## 2017-11-01 ENCOUNTER — Ambulatory Visit (INDEPENDENT_AMBULATORY_CARE_PROVIDER_SITE_OTHER): Payer: Medicare Other | Admitting: Family Medicine

## 2017-11-01 ENCOUNTER — Telehealth: Payer: Self-pay | Admitting: Family Medicine

## 2017-11-01 ENCOUNTER — Telehealth: Payer: Self-pay | Admitting: Pulmonary Disease

## 2017-11-01 VITALS — BP 109/69 | HR 71 | Temp 97.7°F | Resp 16 | Ht 62.0 in | Wt 195.2 lb

## 2017-11-01 DIAGNOSIS — D649 Anemia, unspecified: Secondary | ICD-10-CM | POA: Diagnosis not present

## 2017-11-01 DIAGNOSIS — R0602 Shortness of breath: Secondary | ICD-10-CM | POA: Diagnosis not present

## 2017-11-01 DIAGNOSIS — R5383 Other fatigue: Secondary | ICD-10-CM | POA: Diagnosis not present

## 2017-11-01 DIAGNOSIS — R0609 Other forms of dyspnea: Secondary | ICD-10-CM

## 2017-11-01 LAB — COMPREHENSIVE METABOLIC PANEL
ALBUMIN: 4.1 g/dL (ref 3.5–5.2)
ALT: 21 U/L (ref 0–35)
AST: 16 U/L (ref 0–37)
Alkaline Phosphatase: 99 U/L (ref 39–117)
BUN: 22 mg/dL (ref 6–23)
CO2: 28 mEq/L (ref 19–32)
CREATININE: 0.86 mg/dL (ref 0.40–1.20)
Calcium: 10 mg/dL (ref 8.4–10.5)
Chloride: 100 mEq/L (ref 96–112)
GFR: 67.19 mL/min (ref >60.00)
Glucose, Bld: 118 mg/dL — ABNORMAL HIGH (ref 70–99)
POTASSIUM: 4.6 meq/L (ref 3.5–5.1)
SODIUM: 138 meq/L (ref 135–145)
TOTAL PROTEIN: 6.5 g/dL (ref 6.0–8.3)
Total Bilirubin: 0.4 mg/dL (ref 0.2–1.2)

## 2017-11-01 LAB — CBC WITH DIFFERENTIAL/PLATELET
Basophils Absolute: 0.1 10*3/uL (ref 0.0–0.1)
Basophils Relative: 1.4 % (ref 0.0–3.0)
EOS ABS: 0.2 10*3/uL (ref 0.0–0.7)
Eosinophils Relative: 2.4 % (ref 0.0–5.0)
HEMATOCRIT: 35 % — AB (ref 36.0–46.0)
HEMOGLOBIN: 11.7 g/dL — AB (ref 12.0–15.0)
LYMPHS PCT: 18.4 % (ref 12.0–46.0)
Lymphs Abs: 1.5 10*3/uL (ref 0.7–4.0)
MCHC: 33.3 g/dL (ref 30.0–36.0)
MCV: 93.9 fl (ref 78.0–100.0)
Monocytes Absolute: 0.8 10*3/uL (ref 0.1–1.0)
Monocytes Relative: 10.5 % (ref 3.0–12.0)
Neutro Abs: 5.4 10*3/uL (ref 1.4–7.7)
Neutrophils Relative %: 67.3 % (ref 43.0–77.0)
Platelets: 342 10*3/uL (ref 150.0–400.0)
RBC: 3.73 Mil/uL — ABNORMAL LOW (ref 3.87–5.11)
RDW: 13.8 % (ref 11.5–15.5)
WBC: 8 10*3/uL (ref 4.0–10.5)

## 2017-11-01 LAB — IRON: Iron: 92 ug/dL (ref 42–145)

## 2017-11-01 LAB — FERRITIN: FERRITIN: 42.4 ng/mL (ref 10.0–291.0)

## 2017-11-01 LAB — TSH: TSH: 3.06 u[IU]/mL (ref 0.35–4.50)

## 2017-11-01 NOTE — Telephone Encounter (Signed)
Pt has been off her CPAP several months, says she returned it to [place]. She has been much more fatigued lately and we need her to get back on this. In review of EMR, it appears that she really needs to have f/u with pulm regarding her issues she was having with OSA/CPAP intol and we'll defer the decision about getting her CPAP machine back to them.  Pls call pt and tell her she needs to call pulm to schedule a f/u visit.-thx

## 2017-11-01 NOTE — Telephone Encounter (Signed)
Pt states that she wants to go back on cpap.  Pt has not been seen in over 1 year.  Scheduled ov with [facility] to discuss cpap restart.  Nothing further needed at this time.

## 2017-11-01 NOTE — Telephone Encounter (Signed)
Pt advised and voiced understanding.   

## 2017-11-01 NOTE — Progress Notes (Signed)
OFFICE VISIT  11/01/2017   CC:  Chief Complaint  Patient presents with  . Fatigue    w/ SHOB   HPI:    Patient is a 84 y.o. Caucasian female with hx of DM 2, HTN, HLD, and hypothyroidism who presents for easy fatiguability, DOE. Has chronic DOE, gradually worsening the last few months, but she notes signif worse the last 3 wks.  Just a slight amount of SOB when sitting, can't talk as long w/out taking a breath.  Can only walk 20 steps or so until she gets so SOB that she has to stop and rest to catch her breath.  Describes leg fatigue with same distance walking. No exertional CP, no chest pressure.  When leans over forward she gets a sharp sternal CP that is alleviated with sitting back upright. Swelling legs is chronic, says this has been no worse lately. Trying to eat lower sodium and stay hydrated well lately.   No palpitations or dizziness or diaphoresis.  No jaw or shoulder/arm pain. No PND or orthopnea.  Glucose 261 fasting this morning.  Recent A1c showed good diabetic control overall.  She has NOT been using CPAP as rx'd by pulm 07/2016 (last pulm o/v 08/14/16 when she was originally put on CPAP)-->she gave her CPAP back b/c it was bothering her and keeping husband from sleeping.   She really wants to get her CPAP back now.  Has long hx of fatigue/DOE in the past, with CV/pulm w/u positive only for grd I DD on echo 2017.  Past Medical History:  Diagnosis Date  . Abdominal pain, right lower quadrant   . Bursitis   . Cataracts, both eyes    soon to have cataract surg as of 03/2015  . Diabetes mellitus with complication (HCC)    DPN.  NO diab retinopathy as of 06/2017 eye exam.  . Diabetic peripheral neuropathy associated with type 2 diabetes mellitus ([place])   . Diverticulosis of colon    noted on colonoscopy 2008  . Dyspnea    a. 09/2011 CTA Chest: No PE, Ca2+ cors;  b. 09/2011 R&L heart Cath: Relatively nl R heart pressures, nl co/ci, nonobs cad, nl EF.  12/2015 stress test  normal, echo with grd II DD--likely explains DOE.  Marland Kitchen External hemorrhoid   . Fatigue    w/ ? excessive daytime somnolence; ? OSA--eval by Dr. Halford Chessman 06/2016, home sleep study ordered.  . Former smoker    30 pack-yrs, quit 2005  . Heart murmur    hx of  . Hot flashes    restarted estradiol 01/2015  . Hx of adenomatous polyp of colon 08/25/2016   07/2016 no recall  . Hyperlipemia, mixed    trigs remain elevated (200s-300s range), but LDL at goal.  . Hypertension   . Hypothyroidism   . IBS (irritable bowel syndrome)    IBS-D (Dr. Carlean Purl) + ? worsened by GI side effect of metformin.  NO pancreatic insufficiency.  As per 09/2017 GI f/u, plan is to continue dicyclomine + f/u with them on prn basis  . Interstitial cystitis   . Liver function study, abnormal   . Lumbar degenerative disc disease    Facet-mediated pain per Dr. Nelva Bush.  Has gotten repeated LB injections, most recently April 2016 in [place].  Eval by Dr. Tonita Cong 03/2017.  Spinal stenosis without neurogenic claudication.  Arthropathy of lumbar facet jt: pt pursuing radiofrequency rhizotomy as of 04/23/17 ortho f/u  . Lumbar scoliosis    lumbosacral spondylosis w/out myelopathy (  radiofrequency ablation/lesioning procedure planned for 05/2017).  . Macular degeneration    OS exudative (avastin treatments).  Advanced nonexudative OD, monitor.  AREDS  . Macular hole 07/05/2017   OD; surgically repaired 07/2017.  . Menopausal syndrome   . Morton's neuroma   . Obesity, Class II, BMI 35-39.9   . OSA on CPAP 07/2016   HST 07/27/16 >> AHI 6.2, SaO2 low 79%.  CPAP recommended by pulm  . Other specified gastritis without mention of hemorrhage    hx of antral gastritis on EGD 05/2009  . Peripheral neuropathy    ? DPN?--per EMG 11/2012.  Intolerant of gabapentin, failed pamelor.  Radiofrequency neurotomy 12/11/14 helped LB pain (Dr. Nelva Bush)  . Pneumonia   . Recurrent UTI    on daily antibiotic prophylaxis by her urologist.  . RLS (restless legs syndrome)    . Sleep apnea   . Tarsal tunnel syndrome   . Unspecified venous (peripheral) insufficiency     Past Surgical History:  Procedure Laterality Date  . ANKLE SURGERY     bilateral  . CARDIAC CATHETERIZATION  09/2011   Nonobstructive CAD  . CARDIOVASCULAR STRESS TEST  01/04/2016   Nuclear medicine stress test: NORMAL  . CARDIOVASCULAR STRESS TEST  01/04/2016   Normal stress nuclear study with no ischemia or infarction; EF 82 with normal wall motion  . CATARACT EXTRACTION    . COLONOSCOPY  2002/2005, 11/14/06, and 05/2009   polyps 2002 but none 2005 or 2008 or 2011.  Repeat 08/17/16 for diarrhea: one diminutive adenoma + small polyps removed, diverticulosis noted--no further colonoscopies needed (Dr. Carlean Purl).  Marland Kitchen DEXA  10/2016   NORMAL--consider repeat 2 yrs.  . ESOPHAGOGASTRODUODENOSCOPY  05/2009   Moderate antral gastritis  . FOOT SURGERY     R foot bunionectomy and arthroplasty of digits R foot.  Arthroplasty digits 3 and 4 L foot.  Marland Kitchen HAMMER TOE SURGERY     bilateral  . left tarsal tunnel release     sept '11 (Dr Beola Cord)  . PARTIAL KNEE ARTHROPLASTY Right 11/06/2016   Procedure: UNICOMPARTMENTAL RIGHT KNEE- Medially;  Surgeon: Paralee Cancel, MD;  Location: [facility];  Service: Orthopedics;  Laterality: Right;  90 mins  . radiofrequency neurotomy  06/18/2015   bilat L3-4 medial branch nerve and bilat L5 dorsal ramus nerve.(Dr. Nelva Bush)  . TONSILLECTOMY    . TOTAL ABDOMINAL HYSTERECTOMY W/ BILATERAL SALPINGOOPHORECTOMY    . TRANSTHORACIC ECHOCARDIOGRAM  08/2011; 2017   2013 Grade I DD; 2017 EF 60-65%, normal wall motion, grd II DD.  . unicompartmental right knee     11/06/16 Dr. Alvan Dame    Outpatient Medications Prior to Visit  Medication Sig Dispense Refill  . albuterol (PROVENTIL HFA;VENTOLIN HFA) 108 (90 Base) MCG/ACT inhaler Inhale 2 puffs into the lungs every 6 (six) hours as needed for wheezing or shortness of breath. 1 Inhaler 1  . ALPRAZolam (XANAX) 0.25 MG tablet Take 0.25 mg by  mouth at bedtime as needed for anxiety.    Marland Kitchen aspirin 81 MG chewable tablet Chew 1 tablet (81 mg total) by mouth 2 (two) times daily. for 4 weeks post-operative 60 tablet 0  . atorvastatin (LIPITOR) 20 MG tablet TAKE ONE TABLET BY MOUTH DAILY 90 tablet 1  . Calcium Carbonate (CALCIUM 600 PO) Take 1 tablet by mouth daily.    . celecoxib (CELEBREX) 200 MG capsule Take 1 capsule (200 mg total) by mouth daily. 90 capsule 1  . diclofenac sodium (VOLTAREN) 1 % GEL APPLY 2 GRAM TO THE  AFFECTED AREA(S) BY TOPICAL ROUTE 4 TIMES PER DAY  1  . dicyclomine (BENTYL) 10 MG capsule Take 1 tab 2-3 times daily for diarrhea/urgency. 90 capsule 6  . DULoxetine (CYMBALTA) 60 MG capsule TAKE ONE CAPSULE BY MOUTH DAILY 90 capsule 1  . estradiol (ESTRACE) 1 MG tablet Take 1 tablet (1 mg total) by mouth daily. 90 tablet 3  . ferrous sulfate 325 (65 FE) MG tablet Take 1 tablet (325 mg total) by mouth 2 (two) times daily with a meal. 30 tablet 0  . furosemide (LASIX) 40 MG tablet Take 1 tablet as needed for swelling 90 tablet 1  . glucose blood test strip Use to check blood sugar once daily 100 each 11  . HYDROcodone-acetaminophen (NORCO) 7.5-325 MG tablet Take 1-2 tablets by mouth every 6 (six) hours. 60 tablet 0  . Lancets 30G MISC Use to check blood sugar once daily 100 each 11  . levothyroxine (SYNTHROID, LEVOTHROID) 25 MCG tablet TAKE ONE TABLET BY MOUTH DAILY 90 tablet 1  . loratadine (CLARITIN) 10 MG tablet Take 10 mg by mouth daily as needed for allergies.    Marland Kitchen losartan-hydrochlorothiazide (HYZAAR) 50-12.5 MG tablet TAKE ONE TABLET BY MOUTH DAILY 90 tablet 1  . metFORMIN (GLUCOPHAGE-XR) 500 MG 24 hr tablet TAKE TWO TABLETS BY MOUTH DAILY WITH BREAKFAST 180 tablet 1  . metoprolol tartrate (LOPRESSOR) 50 MG tablet TAKE ONE TABLET BY MOUTH TWICE DAILY 180 tablet 1  . mirtazapine (REMERON) 15 MG tablet Take 0.5 tablets (7.5 mg total) by mouth at bedtime. 90 tablet 1  . Multiple Vitamins-Minerals (MULTIVITAMIN ADULT PO)  Take 1 tablet by mouth daily.     . Multiple Vitamins-Minerals (PRESERVISION AREDS 2 PO) Take by mouth.    Marland Kitchen omeprazole (PRILOSEC) 20 MG capsule TAKE ONE CAPSULE BY MOUTH DAILY 90 capsule 1  . oxybutynin (DITROPAN-XL) 10 MG 24 hr tablet Take 1 tablet by mouth daily.    . pioglitazone (ACTOS) 15 MG tablet TAKE ONE TABLET BY MOUTH DAILY 90 tablet 1  . potassium chloride SA (K-DUR,KLOR-CON) 20 MEQ tablet Take 1 tablet (20 mEq total) by mouth daily. 90 tablet 3  . pregabalin (LYRICA) 50 MG capsule Take 1 capsule (50 mg total) by mouth 2 (two) times daily. 180 capsule 3  . tiZANidine (ZANAFLEX) 4 MG tablet Take 1 tablet by mouth every 8 (eight) hours as needed.  1  . Difluprednate (DUREZOL) 0.05 % EMUL Durezol 0.05 % eye drops  ONE DROP IN THE RIGHT EYE FOUR TIMES DAILY     No facility-administered medications prior to visit.     Allergies  Allergen Reactions  . Gabapentin Itching  . Sulfonamide Derivatives     REACTION: Nausea    ROS As per HPI  PE: Blood pressure 109/69, pulse 71, temperature 97.7 F (36.5 C), temperature source Oral, resp. rate 16, height 5\' 2"  (1.575 m), weight 195 lb 4 oz (88.6 kg), SpO2 97 %. Gen: Alert, well appearing.  Patient is oriented to person, place, time, and situation. AFFECT: pleasant, lucid thought and speech. Lots of effort and self-balancing needed to get on exam table. GUR:KYHC: no injection, icteris, swelling, or exudate.  EOMI, PERRLA. Mouth: lips without lesion/swelling.  Oral mucosa pink and moist. Oropharynx without erythema, exudate, or swelling. Nose w/out obstruction. CV: RRR, no m/r/g.   LUNGS: CTA bilat, nonlabored resps, good aeration in all lung fields. EXT: no clubbing or cyanosis.  No calf tenderness, cord, or nodule.  1 + bilat pitting edema with  skin changes c/w chronic venous stasis/ no active/acute dermatitis and no sign of cellulitis.   LABS:  Lab Results  Component Value Date   TSH 2.34 07/12/2017   Lab Results  Component  Value Date   WBC 11.2 (H) 01/11/2017   HGB 11.7 (L) 01/11/2017   HCT 35.9 (L) 01/11/2017   MCV 95.7 01/11/2017   PLT 340.0 01/11/2017   Lab Results  Component Value Date   IRON 78 01/11/2017   IRON 82 01/11/2017   TIBC 403 01/11/2017   FERRITIN 21.8 01/11/2017    Lab Results  Component Value Date   CREATININE 0.83 10/10/2017   BUN 19 10/10/2017   NA 139 10/10/2017   K 4.2 10/10/2017   CL 98 10/10/2017   CO2 31 10/10/2017   Lab Results  Component Value Date   ALT 25 10/10/2017   AST 20 10/10/2017   ALKPHOS 101 10/10/2017   BILITOT 0.4 10/10/2017   Lab Results  Component Value Date   CHOL 154 04/17/2017   Lab Results  Component Value Date   HDL 46 (L) 04/17/2017   Lab Results  Component Value Date   LDLCALC 68 04/17/2017   Lab Results  Component Value Date   TRIG 334 (H) 04/17/2017   Lab Results  Component Value Date   CHOLHDL 3.3 04/17/2017   Lab Results  Component Value Date   HGBA1C 6.8 (H) 10/10/2017    IMPRESSION AND PLAN:  1) Subacute-on-chronic DOE, significant fatigue. Definitely needs to get back on CPAP: did not f/u with pulm as instructed back in summer 2018. Recommended pt call pulm for f/u visit. Check CBC, CMET, TSH, iron studies today. EKG ordered but our office EKG machine did not work today unfortunately.  I do not feel like there is definite need of this test today. Will get CT angio chest to r/o PE. If CT normal and if she does not feel better getting back on CPAP, then we'll recheck echocardiogram, and also consider cardiac stress testing.  Spent 45 min with pt today, with >50% of this time spent in counseling and care coordination regarding the above problems.  An After Visit Summary was printed and given to the patient.  FOLLOW UP: Return in about 4 weeks (around 11/29/2017) for f/u SOB/DOE/fatigue.  Signed:  Crissie Sickles, MD           11/01/2017

## 2017-11-02 LAB — IRON AND TIBC
IRON SATURATION: 20 % (ref 15–55)
IRON: 74 ug/dL (ref 27–139)
Total Iron Binding Capacity: 373 ug/dL (ref 250–450)
UIBC: 299 ug/dL (ref 118–369)

## 2017-11-05 ENCOUNTER — Ambulatory Visit: Payer: Medicare Other | Admitting: Pulmonary Disease

## 2017-11-05 ENCOUNTER — Encounter: Payer: Self-pay | Admitting: Family Medicine

## 2017-11-05 ENCOUNTER — Ambulatory Visit (HOSPITAL_BASED_OUTPATIENT_CLINIC_OR_DEPARTMENT_OTHER)
Admission: RE | Admit: 2017-11-05 | Discharge: 2017-11-05 | Disposition: A | Discharge status: Home / Self Care | Payer: Medicare Other | Source: Ambulatory Visit | Attending: Family Medicine | Admitting: Family Medicine

## 2017-11-05 DIAGNOSIS — R0609 Other forms of dyspnea: Secondary | ICD-10-CM | POA: Diagnosis not present

## 2017-11-05 DIAGNOSIS — R0602 Shortness of breath: Secondary | ICD-10-CM

## 2017-11-05 DIAGNOSIS — I700 Atherosclerosis of aorta: Secondary | ICD-10-CM | POA: Insufficient documentation

## 2017-11-05 DIAGNOSIS — H353222 Exudative age-related macular degeneration, left eye, with inactive choroidal neovascularization: Secondary | ICD-10-CM | POA: Diagnosis not present

## 2017-11-05 HISTORY — PX: OTHER SURGICAL HISTORY: SHX169

## 2017-11-05 MED ORDER — IOPAMIDOL (ISOVUE-370) INJECTION 76%
100.0000 mL | Freq: Once | INTRAVENOUS | Status: AC | PRN
  Administered 2017-11-05: 76 mL via INTRAVENOUS

## 2017-11-06 ENCOUNTER — Encounter: Payer: Self-pay | Admitting: *Deleted

## 2017-11-06 DIAGNOSIS — L602 Onychogryphosis: Secondary | ICD-10-CM | POA: Diagnosis not present

## 2017-11-06 DIAGNOSIS — E1351 Other specified diabetes mellitus with diabetic peripheral angiopathy without gangrene: Secondary | ICD-10-CM | POA: Diagnosis not present

## 2017-11-06 DIAGNOSIS — L84 Corns and callosities: Secondary | ICD-10-CM | POA: Diagnosis not present

## 2017-11-06 DIAGNOSIS — M2041 Other hammer toe(s) (acquired), right foot: Secondary | ICD-10-CM | POA: Diagnosis not present

## 2017-11-12 ENCOUNTER — Ambulatory Visit (INDEPENDENT_AMBULATORY_CARE_PROVIDER_SITE_OTHER): Payer: Medicare Other | Admitting: Pulmonary Disease

## 2017-11-12 ENCOUNTER — Encounter: Payer: Self-pay | Admitting: Pulmonary Disease

## 2017-11-12 VITALS — BP 120/78 | HR 75 | Ht 62.0 in | Wt 197.6 lb

## 2017-11-12 DIAGNOSIS — G4733 Obstructive sleep apnea (adult) (pediatric): Secondary | ICD-10-CM

## 2017-11-12 DIAGNOSIS — R0609 Other forms of dyspnea: Secondary | ICD-10-CM | POA: Diagnosis not present

## 2017-11-12 NOTE — Progress Notes (Signed)
[facility]  Chief Complaint  Patient presents with  . Follow-up    discuss restarting cpap machine. c/o increased sob with exertion.    Constitutional: BP 120/78 (BP Location: Left Arm, Cuff Size: Normal)   Pulse 75   Ht 5\' 2"  (1.575 m)   Wt 197 lb 9.6 oz (89.6 kg)   SpO2 97%   BMI 36.14 kg/m   History of Present Illness: Katherine Best is a 84 y.o. female former smoker with obstructive sleep apnea.  She had sleep study last year.  Very mild sleep apnea.  Tried CPAP, but wasn't able to sleep with it.  No difference with her sleep.  She has been getting more short of breath with activity.  She has back problems and has trouble standing for long periods of time.  She quit smoking years ago, but used to smoke 1 ppd.  She uses albuterol and this helps.  She isn't having chest pain, palpitations, fever, hemoptysis, skin rash, sputum, or wheezing.  She has appointment with cardiology coming up soon.  Comprehensive Respiratory Exam:  Appearance - well kempt  ENMT - nasal mucosa moist, turbinates clear, midline nasal septum, no dental lesions, no gingival bleeding, no oral exudates, no tonsillar hypertrophy Neck - no masses, trachea midline, no thyromegaly, no elevation in JVP Respiratory - normal appearance of chest wall, normal respiratory effort w/o accessory muscle use, no dullness on percussion, no wheezing or rales CV - s1s2 regular rate and rhythm, no murmurs, 1+ peripheral edema, radial pulses symmetric GI - soft, non tender, no masses Lymph - no adenopathy noted in neck and axillary areas MSK - normal muscle strength and tone, normal gait Ext - no cyanosis, clubbing, or joint inflammation noted Skin - no rashes, lesions, or ulcers Neuro - oriented to person, place, and time Psych - normal mood and affect   CMP Latest Ref Rng & Units 11/01/2017 10/10/2017 04/17/2017  Glucose 70 - 99 mg/dL 118(H) 135(H) 117(H)  BUN 6 - 23 mg/dL 22 19 18     Creatinine 0.40 - 1.20 mg/dL 0.86 0.83 0.72  Sodium 135 - 145 mEq/L 138 139 140  Potassium 3.5 - 5.1 mEq/L 4.6 4.2 4.2  Chloride 96 - 112 mEq/L 100 98 101  CO2 19 - 32 mEq/L 28 31 28   Calcium 8.4 - 10.5 mg/dL 10.0 9.5 9.5  Total Protein 6.0 - 8.3 g/dL 6.5 6.5 -  Total Bilirubin 0.2 - 1.2 mg/dL 0.4 0.4 -  Alkaline Phos 39 - 117 U/L 99 101 -  AST 0 - 37 U/L 16 20 -  ALT 0 - 35 U/L 21 25 -    CBC Latest Ref Rng & Units 11/01/2017 01/11/2017 11/07/2016  WBC 4.0 - 10.5 K/uL 8.0 11.2(H) 5.9  Hemoglobin 12.0 - 15.0 g/dL 11.7(L) 11.7(L) 8.8(L)  Hematocrit 36.0 - 46.0 % 35.0(L) 35.9(L) 26.3(L)  Platelets 150.0 - 400.0 K/uL 342.0 340.0 260     Discussion: She has progressive dyspnea on exertion.  She is relatively inactive related to back pain, and could have component of deconditioning.  She has prior history of smoking and reports benefit from albuterol use.  She has a history of diastolic CHF.  Assessment/Plan:  Dyspnea on exertion. - will schedule PFT - continue albuterol - f/u with cardiology  Mild obstructive sleep apnea. - will reassess whether she needs to consider starting CPAP again after her shortness of breath is further assessed   Patient Instructions  Will schedule pulmonary  function test Follow up in 2 weeks with Dr. Halford Chessman or Nurse Practitioner    Chesley Mires, MD [facility] 11/12/2017, 2:46 PM  Flow Sheet  Pulmonary tests: CT angio chest 11/05/17 >> no PE (reviewed by me)  Sleep tests: HST 07/27/16 >> AHI 6.2, SaO2 low 79%  Cardiac tests: Echo 01/04/16 >> mod LVH, EF 60 to 65%, grade 2 DD  Past Medical History: She  has a past medical history of Abdominal pain, right lower quadrant, Bursitis, Cataracts, both eyes, Diabetes mellitus with complication ([place]), Diabetic peripheral neuropathy associated with type 2 diabetes mellitus ([place]), Diverticulosis of colon, Dyspnea, External hemorrhoid, Fatigue, Former smoker, Heart murmur, Hot flashes,  adenomatous polyp of colon (08/25/2016), Hyperlipemia, mixed, Hypertension, Hypothyroidism, IBS (irritable bowel syndrome), Interstitial cystitis, Liver function study, abnormal, Lumbar degenerative disc disease, Lumbar scoliosis, Macular degeneration, Macular hole (07/05/2017), Menopausal syndrome, Morton's neuroma, Obesity, Class II, BMI 35-39.9, OSA on CPAP (07/2016), Other specified gastritis without mention of hemorrhage, Peripheral neuropathy, Pneumonia, Recurrent UTI, RLS (restless legs syndrome), Sleep apnea, Tarsal tunnel syndrome, and Unspecified venous (peripheral) insufficiency.  Past Surgical History: She  has a past surgical history that includes Total abdominal hysterectomy w/ bilateral salpingoophorectomy; Tonsillectomy; Foot surgery; left tarsal tunnel release; Colonoscopy (2002/2005, 11/14/06, and 05/2009); Hammer toe surgery; Ankle surgery; Esophagogastroduodenoscopy (05/2009); Cardiac catheterization (09/2011); transthoracic echocardiogram (08/2011; 2017); radiofrequency neurotomy (06/18/2015); Cardiovascular stress test (01/04/2016); Cataract extraction; DEXA (10/2016); Partial knee arthroplasty (Right, 11/06/2016); and CT angiogram chest (11/05/2017).  Family History: Her family history includes Breast cancer in her mother; Cancer in her mother; Diabetes in her maternal grandfather; Hyperlipidemia in her father; Hypertension in her father; Mental illness in her brother.  Social History: She  reports that she quit smoking about 15 years ago. Her smoking use included cigarettes. She has a 35.00 pack-year smoking history. She has never used smokeless tobacco. She reports that she does not drink alcohol or use drugs.  Medications: Allergies as of 11/12/2017      Reactions   Gabapentin Itching   Sulfonamide Derivatives    REACTION: Nausea      Medication List        Accurate as of 11/12/17  2:46 PM. Always use your most recent med list.          albuterol 108 (90 Base) MCG/ACT  inhaler Commonly known as:  PROVENTIL HFA;VENTOLIN HFA Inhale 2 puffs into the lungs every 6 (six) hours as needed for wheezing or shortness of breath.   ALPRAZolam 0.25 MG tablet Commonly known as:  XANAX Take 0.25 mg by mouth at bedtime as needed for anxiety.   aspirin 81 MG chewable tablet Chew 1 tablet (81 mg total) by mouth 2 (two) times daily. for 4 weeks post-operative   atorvastatin 20 MG tablet Commonly known as:  LIPITOR TAKE ONE TABLET BY MOUTH DAILY   CALCIUM 600 PO Take 1 tablet by mouth daily.   celecoxib 200 MG capsule Commonly known as:  CELEBREX Take 1 capsule (200 mg total) by mouth daily.   diclofenac sodium 1 % Gel Commonly known as:  VOLTAREN APPLY 2 GRAM TO THE AFFECTED AREA(S) BY TOPICAL ROUTE 4 TIMES PER DAY   dicyclomine 10 MG capsule Commonly known as:  BENTYL Take 1 tab 2-3 times daily for diarrhea/urgency.   DULoxetine 60 MG capsule Commonly known as:  CYMBALTA TAKE ONE CAPSULE BY MOUTH DAILY   estradiol 1 MG tablet Commonly known as:  ESTRACE Take 1 tablet (1 mg total) by mouth daily.  ferrous sulfate 325 (65 FE) MG tablet Take 1 tablet (325 mg total) by mouth 2 (two) times daily with a meal.   furosemide 40 MG tablet Commonly known as:  LASIX Take 1 tablet as needed for swelling   glucose blood test strip Use to check blood sugar once daily   HYDROcodone-acetaminophen 7.5-325 MG tablet Commonly known as:  NORCO Take 1-2 tablets by mouth every 6 (six) hours.   Lancets 30G Misc Use to check blood sugar once daily   levothyroxine 25 MCG tablet Commonly known as:  SYNTHROID, LEVOTHROID TAKE ONE TABLET BY MOUTH DAILY   loratadine 10 MG tablet Commonly known as:  CLARITIN Take 10 mg by mouth daily as needed for allergies.   losartan-hydrochlorothiazide 50-12.5 MG tablet Commonly known as:  HYZAAR TAKE ONE TABLET BY MOUTH DAILY   metFORMIN 500 MG 24 hr tablet Commonly known as:  GLUCOPHAGE-XR TAKE TWO TABLETS BY MOUTH DAILY  WITH BREAKFAST   metoprolol tartrate 50 MG tablet Commonly known as:  LOPRESSOR TAKE ONE TABLET BY MOUTH TWICE DAILY   mirtazapine 15 MG tablet Commonly known as:  REMERON Take 0.5 tablets (7.5 mg total) by mouth at bedtime.   MULTIVITAMIN ADULT PO Take 1 tablet by mouth daily.   PRESERVISION AREDS 2 PO Take by mouth.   omeprazole 20 MG capsule Commonly known as:  PRILOSEC TAKE ONE CAPSULE BY MOUTH DAILY   oxybutynin 10 MG 24 hr tablet Commonly known as:  DITROPAN-XL Take 1 tablet by mouth daily.   pioglitazone 15 MG tablet Commonly known as:  ACTOS TAKE ONE TABLET BY MOUTH DAILY   potassium chloride SA 20 MEQ tablet Commonly known as:  K-DUR,KLOR-CON Take 1 tablet (20 mEq total) by mouth daily.   pregabalin 50 MG capsule Commonly known as:  LYRICA Take 1 capsule (50 mg total) by mouth 2 (two) times daily.   tiZANidine 4 MG tablet Commonly known as:  ZANAFLEX Take 1 tablet by mouth every 8 (eight) hours as needed.

## 2017-11-12 NOTE — Patient Instructions (Signed)
Will schedule pulmonary function test Follow up in 2 weeks with Dr. Halford Chessman or Nurse Practitioner

## 2017-11-15 DIAGNOSIS — M47896 Other spondylosis, lumbar region: Secondary | ICD-10-CM | POA: Diagnosis not present

## 2017-11-15 DIAGNOSIS — M1612 Unilateral primary osteoarthritis, left hip: Secondary | ICD-10-CM | POA: Diagnosis not present

## 2017-11-15 DIAGNOSIS — M1611 Unilateral primary osteoarthritis, right hip: Secondary | ICD-10-CM | POA: Diagnosis not present

## 2017-11-15 DIAGNOSIS — S8002XA Contusion of left knee, initial encounter: Secondary | ICD-10-CM | POA: Insufficient documentation

## 2017-11-19 ENCOUNTER — Other Ambulatory Visit: Payer: Self-pay | Admitting: Family Medicine

## 2017-11-19 NOTE — Telephone Encounter (Signed)
RF request for lyrica LOV: 10/10/17 Next ov: 11/29/17 Last written: 03/22/17 #180 w/ 3RF (pharmacy could only use 1 RF)  Please advise. Thanks.

## 2017-11-20 DIAGNOSIS — M2041 Other hammer toe(s) (acquired), right foot: Secondary | ICD-10-CM | POA: Diagnosis not present

## 2017-11-20 DIAGNOSIS — M24574 Contracture, right foot: Secondary | ICD-10-CM | POA: Diagnosis not present

## 2017-11-20 DIAGNOSIS — M79674 Pain in right toe(s): Secondary | ICD-10-CM | POA: Diagnosis not present

## 2017-11-27 ENCOUNTER — Telehealth: Payer: Self-pay | Admitting: Family Medicine

## 2017-11-27 NOTE — Telephone Encounter (Unsigned)
Copied from [place] [phone]. Topic: Quick Communication - See Telephone Encounter >> Nov 27, 2017 10:30 AM Katherine Best wrote: Pt needing to know if she needs to reschedule her Thus appt

## 2017-11-27 NOTE — Telephone Encounter (Signed)
I think it is best if she still comes in to see me 11/29/17.-thx

## 2017-11-27 NOTE — Telephone Encounter (Signed)
Patient did see Dr Halford Chessman on 11/12/17, patient now inquiring if she needs to follow up with you on 9.5.19?  Please advise.

## 2017-11-28 ENCOUNTER — Encounter: Payer: Self-pay | Admitting: Family Medicine

## 2017-11-28 NOTE — Telephone Encounter (Signed)
Patient aware.

## 2017-11-29 ENCOUNTER — Ambulatory Visit (INDEPENDENT_AMBULATORY_CARE_PROVIDER_SITE_OTHER): Payer: Medicare Other | Admitting: Family Medicine

## 2017-11-29 ENCOUNTER — Encounter: Payer: Self-pay | Admitting: Family Medicine

## 2017-11-29 VITALS — BP 105/72 | HR 60 | Temp 97.7°F | Resp 16 | Ht 62.0 in | Wt 193.1 lb

## 2017-11-29 DIAGNOSIS — Z23 Encounter for immunization: Secondary | ICD-10-CM

## 2017-11-29 DIAGNOSIS — R0609 Other forms of dyspnea: Secondary | ICD-10-CM

## 2017-11-29 NOTE — Progress Notes (Signed)
OFFICE VISIT  11/29/2017   CC:  Chief Complaint  Patient presents with  . Follow-up    SHOB/DOE/Fatigue    HPI:    Patient is a 84 y.o. Caucasian female who presents for f/u chronic fatigue and relatively recent worsening of her baseline DOE/SOB. She had a CT angio chest 11/05/17 which was neg for PE. She saw her pulmonologist, who is planning on likely getting her back on CPAP for OSA, but he felt that this could be delayed until she got further eval for her DOE/SOB by her cardiologist.  She does, however, have PFTs scheduled for next week. Symptoms unchanged: still gets some SOB/DOE with anything more than 20-30 steps.  Denies chest pain or pressure, denies arm pain or jaw pain, denies diaphoresis or palpitations or dizziness.  She has appt with her Cardiologist, Dr. Harrington Challenger, 12/21/17.  Of note, she started taking CBD oil, 1 dropper once a day, she states it helps her back and bilat leg pain.    Past Medical History:  Diagnosis Date  . Abdominal pain, right lower quadrant   . Cataracts, both eyes    soon to have cataract surg as of 03/2015  . Diabetes mellitus with complication (HCC)    DPN.  NO diab retinopathy as of 06/2017 eye exam.  . Diabetic peripheral neuropathy associated with type 2 diabetes mellitus ([place])   . Diverticulosis of colon    noted on colonoscopy 2008  . Dyspnea    a. 09/2011 CTA Chest: No PE, Ca2+ cors;  b. 09/2011 R&L heart Cath: Relatively nl R heart pressures, nl co/ci, nonobs cad, nl EF.  12/2015 stress test normal, echo with grd II DD--likely explains DOE.  Marland Kitchen External hemorrhoid   . Fatigue    w/ ? excessive daytime somnolence; ? OSA--eval by Dr. Halford Chessman 06/2016, home sleep study ordered.  . Former smoker    30 pack-yrs, quit 2005  . Heart murmur    hx of  . Hot flashes    restarted estradiol 01/2015  . Hx of adenomatous polyp of colon 08/25/2016   07/2016 no recall  . Hyperlipemia, mixed    trigs remain elevated (200s-300s range), but LDL at goal.  .  Hypertension   . Hypothyroidism   . IBS (irritable bowel syndrome)    IBS-D (Dr. Carlean Purl) + ? worsened by GI side effect of metformin.  NO pancreatic insufficiency.  As per 09/2017 GI f/u, plan is to continue dicyclomine + f/u with them on prn basis  . Interstitial cystitis   . Liver function study, abnormal   . Lumbar degenerative disc disease    Facet-mediated pain per Dr. Nelva Bush.  Has gotten repeated LB injections, most recently April 2016 in [place].  Eval by Dr. Tonita Cong 03/2017.  Spinal stenosis without neurogenic claudication.  Arthropathy of lumbar facet jt: pt pursuing radiofrequency rhizotomy as of 04/23/17 ortho f/u  . Lumbar scoliosis    lumbosacral spondylosis w/out myelopathy (radiofrequency ablation/lesioning procedure planned for 05/2017).  . Macular degeneration    OS exudative (avastin treatments).  Advanced nonexudative OD, monitor.  AREDS  . Macular hole 07/05/2017   OD; surgically repaired 07/2017.  . Menopausal syndrome   . Morton's neuroma   . Obesity, Class II, BMI 35-39.9   . OSA on CPAP 07/2016   HST 07/27/16 >> AHI 6.2, SaO2 low 79%.  CPAP recommended by pulm  . Osteoarthritis of both hips 2019   surgery NOT recommended ([facility])  . Other specified gastritis without mention of hemorrhage  hx of antral gastritis on EGD 05/2009  . Peripheral neuropathy    ? DPN?--per EMG 11/2012.  Intolerant of gabapentin, failed pamelor.  Radiofrequency neurotomy 12/11/14 helped LB pain (Dr. Nelva Bush)  . Pneumonia   . Recurrent UTI    on daily antibiotic prophylaxis by her urologist.  . RLS (restless legs syndrome)   . Sleep apnea   . Tarsal tunnel syndrome   . Trochanteric bursitis of both hips 2019   Injections: [facility]  . Unspecified venous (peripheral) insufficiency     Past Surgical History:  Procedure Laterality Date  . ANKLE SURGERY     bilateral  . CARDIAC CATHETERIZATION  09/2011   Nonobstructive CAD  . CARDIOVASCULAR STRESS TEST  01/04/2016   Normal stress nuclear  study with no ischemia or infarction; EF 82 with normal wall motion  . CATARACT EXTRACTION    . COLONOSCOPY  2002/2005, 11/14/06, and 05/2009   polyps 2002 but none 2005 or 2008 or 2011.  Repeat 08/17/16 for diarrhea: one diminutive adenoma + small polyps removed, diverticulosis noted--no further colonoscopies needed (Dr. Carlean Purl).  . CT angiogram chest  11/05/2017   NEG for PE.  Marland Kitchen DEXA  10/2016   NORMAL--consider repeat 2 yrs.  . ESOPHAGOGASTRODUODENOSCOPY  05/2009   Moderate antral gastritis  . FOOT SURGERY     R foot bunionectomy and arthroplasty of digits R foot.  Arthroplasty digits 3 and 4 L foot.  Marland Kitchen HAMMER TOE SURGERY     bilateral  . left tarsal tunnel release     sept '11 (Dr Beola Cord)  . PARTIAL KNEE ARTHROPLASTY Right 11/06/2016   Procedure: UNICOMPARTMENTAL RIGHT KNEE- Medially;  Surgeon: Paralee Cancel, MD;  Location: [facility];  Service: Orthopedics;  Laterality: Right;  90 mins  . radiofrequency neurotomy  06/18/2015   bilat L3-4 medial branch nerve and bilat L5 dorsal ramus nerve.(Dr. Nelva Bush)  . TONSILLECTOMY    . TOTAL ABDOMINAL HYSTERECTOMY W/ BILATERAL SALPINGOOPHORECTOMY    . TRANSTHORACIC ECHOCARDIOGRAM  08/2011; 2017   2013 Grade I DD; 2017 EF 60-65%, normal wall motion, grd II DD.    Outpatient Medications Prior to Visit  Medication Sig Dispense Refill  . albuterol (PROVENTIL HFA;VENTOLIN HFA) 108 (90 Base) MCG/ACT inhaler Inhale 2 puffs into the lungs every 6 (six) hours as needed for wheezing or shortness of breath. 1 Inhaler 1  . ALPRAZolam (XANAX) 0.25 MG tablet Take 0.25 mg by mouth at bedtime as needed for anxiety.    Marland Kitchen aspirin 81 MG chewable tablet Chew 1 tablet (81 mg total) by mouth 2 (two) times daily. for 4 weeks post-operative 60 tablet 0  . atorvastatin (LIPITOR) 20 MG tablet TAKE ONE TABLET BY MOUTH DAILY 90 tablet 1  . Calcium Carbonate (CALCIUM 600 PO) Take 1 tablet by mouth daily.    . celecoxib (CELEBREX) 200 MG capsule Take 1 capsule (200 mg total) by  mouth daily. 90 capsule 1  . diclofenac sodium (VOLTAREN) 1 % GEL APPLY 2 GRAM TO THE AFFECTED AREA(S) BY TOPICAL ROUTE 4 TIMES PER DAY  1  . dicyclomine (BENTYL) 10 MG capsule Take 1 tab 2-3 times daily for diarrhea/urgency. 90 capsule 6  . DULoxetine (CYMBALTA) 60 MG capsule TAKE ONE CAPSULE BY MOUTH DAILY 90 capsule 1  . estradiol (ESTRACE) 1 MG tablet Take 1 tablet (1 mg total) by mouth daily. 90 tablet 3  . ferrous sulfate 325 (65 FE) MG tablet Take 1 tablet (325 mg total) by mouth 2 (two) times daily with a meal. (  Patient taking differently: Take 325 mg by mouth daily. ) 30 tablet 0  . furosemide (LASIX) 40 MG tablet Take 1 tablet as needed for swelling 90 tablet 1  . glucose blood test strip Use to check blood sugar once daily 100 each 11  . HYDROcodone-acetaminophen (NORCO) 7.5-325 MG tablet Take 1-2 tablets by mouth every 6 (six) hours. 60 tablet 0  . Lancets 30G MISC Use to check blood sugar once daily 100 each 11  . levothyroxine (SYNTHROID, LEVOTHROID) 25 MCG tablet TAKE ONE TABLET BY MOUTH DAILY 90 tablet 1  . loratadine (CLARITIN) 10 MG tablet Take 10 mg by mouth daily as needed for allergies.    Marland Kitchen losartan-hydrochlorothiazide (HYZAAR) 50-12.5 MG tablet TAKE ONE TABLET BY MOUTH DAILY 90 tablet 1  . LYRICA 50 MG capsule TAKE ONE CAPSULE BY MOUTH TWICE DAILY 180 capsule 1  . metFORMIN (GLUCOPHAGE-XR) 500 MG 24 hr tablet TAKE TWO TABLETS BY MOUTH DAILY WITH BREAKFAST 180 tablet 1  . metoprolol tartrate (LOPRESSOR) 50 MG tablet TAKE ONE TABLET BY MOUTH TWICE DAILY 180 tablet 1  . mirtazapine (REMERON) 15 MG tablet Take 0.5 tablets (7.5 mg total) by mouth at bedtime. 90 tablet 1  . Multiple Vitamins-Minerals (MULTIVITAMIN ADULT PO) Take 1 tablet by mouth daily.     . Multiple Vitamins-Minerals (PRESERVISION AREDS 2 PO) Take by mouth.    Marland Kitchen omeprazole (PRILOSEC) 20 MG capsule TAKE ONE CAPSULE BY MOUTH DAILY 90 capsule 1  . OVER THE COUNTER MEDICATION CBD Oil    . oxybutynin (DITROPAN-XL)  10 MG 24 hr tablet Take 1 tablet by mouth daily.    . pioglitazone (ACTOS) 15 MG tablet TAKE ONE TABLET BY MOUTH DAILY 90 tablet 1  . potassium chloride SA (K-DUR,KLOR-CON) 20 MEQ tablet Take 1 tablet (20 mEq total) by mouth daily. 90 tablet 3  . tiZANidine (ZANAFLEX) 4 MG tablet Take 1 tablet by mouth every 8 (eight) hours as needed.  1   No facility-administered medications prior to visit.     Allergies  Allergen Reactions  . Gabapentin Itching  . Sulfonamide Derivatives     REACTION: Nausea    ROS As per HPI  PE: Blood pressure 105/72, pulse 60, temperature 97.7 F (36.5 C), temperature source Oral, resp. rate 16, height 5\' 2"  (1.575 m), weight 193 lb 2 oz (87.6 kg), SpO2 97 %. Gen: Alert, well appearing.  Patient is oriented to person, place, time, and situation. AFFECT: pleasant, lucid thought and speech. CV: RRR, no m/r/g.   LUNGS: CTA bilat, nonlabored resps, good aeration in all lung fields. EXT: no clubbing or cyanosis.  Trace pitting edema bilat LLs.    LABS:  Lab Results  Component Value Date   TSH 3.06 11/01/2017   Lab Results  Component Value Date   WBC 8.0 11/01/2017   HGB 11.7 (L) 11/01/2017   HCT 35.0 (L) 11/01/2017   MCV 93.9 11/01/2017   PLT 342.0 11/01/2017   Lab Results  Component Value Date   CREATININE 0.86 11/01/2017   BUN 22 11/01/2017   NA 138 11/01/2017   K 4.6 11/01/2017   CL 100 11/01/2017   CO2 28 11/01/2017   Lab Results  Component Value Date   ALT 21 11/01/2017   AST 16 11/01/2017   ALKPHOS 99 11/01/2017   BILITOT 0.4 11/01/2017   Lab Results  Component Value Date   CHOL 154 04/17/2017   Lab Results  Component Value Date   HDL 46 (L) 04/17/2017  Lab Results  Component Value Date   LDLCALC 68 04/17/2017   Lab Results  Component Value Date   TRIG 334 (H) 04/17/2017   Lab Results  Component Value Date   CHOLHDL 3.3 04/17/2017   Lab Results  Component Value Date   HGBA1C 6.8 (H) 10/10/2017   12 lead EKG  today: Sinus brady (rate 58), 1st deg AV block, isolated TWI in V1, Isolated Q wave in III.  Diffuse low voltage.  No acute ischemic changes.  QT interval normal, QRS duration normal.  No ectopy. Compared to EKG 07/26/16 there is no change.  IMPRESSION AND PLAN:  Dyspnea on exertion; subacute-on-chronic. So far w/u unrevealing (CT angio chest, blood work, pulmonology follow up, EKG). Will check echo and lexiscan stress test.  An After Visit Summary was printed and given to the patient.  FOLLOW UP: Return for f/u to be determined based on results of work up.  Signed:  Crissie Sickles, MD           11/29/2017

## 2017-11-30 DIAGNOSIS — M24574 Contracture, right foot: Secondary | ICD-10-CM | POA: Diagnosis not present

## 2017-12-04 ENCOUNTER — Ambulatory Visit (INDEPENDENT_AMBULATORY_CARE_PROVIDER_SITE_OTHER): Payer: Medicare Other | Admitting: Internal Medicine

## 2017-12-04 ENCOUNTER — Telehealth (HOSPITAL_COMMUNITY): Payer: Self-pay | Admitting: *Deleted

## 2017-12-04 ENCOUNTER — Encounter: Payer: Self-pay | Admitting: Internal Medicine

## 2017-12-04 VITALS — BP 124/54 | HR 71 | Ht 62.0 in | Wt 194.1 lb

## 2017-12-04 DIAGNOSIS — H353222 Exudative age-related macular degeneration, left eye, with inactive choroidal neovascularization: Secondary | ICD-10-CM | POA: Diagnosis not present

## 2017-12-04 DIAGNOSIS — Z01812 Encounter for preprocedural laboratory examination: Secondary | ICD-10-CM

## 2017-12-04 DIAGNOSIS — R0602 Shortness of breath: Secondary | ICD-10-CM | POA: Diagnosis not present

## 2017-12-04 DIAGNOSIS — H35341 Macular cyst, hole, or pseudohole, right eye: Secondary | ICD-10-CM | POA: Diagnosis not present

## 2017-12-04 DIAGNOSIS — H353113 Nonexudative age-related macular degeneration, right eye, advanced atrophic without subfoveal involvement: Secondary | ICD-10-CM | POA: Diagnosis not present

## 2017-12-04 DIAGNOSIS — I700 Atherosclerosis of aorta: Secondary | ICD-10-CM

## 2017-12-04 DIAGNOSIS — Z9889 Other specified postprocedural states: Secondary | ICD-10-CM | POA: Diagnosis not present

## 2017-12-04 NOTE — H&P (View-Only) (Signed)
Cardiology Office Note   Date:  12/04/2017   ID:  Katherine Best, DOB 08-18-1933, MRN [id number]  PCP:  Tammi Sou, MD  Cardiologist:   Dorris Carnes, MD    Pt presents for f/u of CAD and SOB     History of Present Illness: Katherine Best is a 84 y.o. female with a history of mld CAD by cath  Normal R heart prssures  Mild diastolic dysfuncton  Also a history of DM   I last saw her last sumbber (2018) She underwent  echo and lexiscan myovue testing  Myovue ws normal  Echo with normal systolic func  Gr II diastolic dysfunction     Since I saw her she continues to be SOB   SHe has was recenetly seen by Dr [name]  Set up for another echo and myovue  She is due to see V Sood   Set for PFTs tomorrow  She says her SOB is getting worse   Denies CP   Does say that she is under increased stress.     Current Meds  Medication Sig  . albuterol (PROVENTIL HFA;VENTOLIN HFA) 108 (90 Base) MCG/ACT inhaler Inhale 2 puffs into the lungs every 6 (six) hours as needed for wheezing or shortness of breath.  . ALPRAZolam (XANAX) 0.25 MG tablet Take 0.25 mg by mouth at bedtime as needed for anxiety.  Marland Kitchen aspirin 81 MG chewable tablet Chew 1 tablet (81 mg total) by mouth 2 (two) times daily. for 4 weeks post-operative  . atorvastatin (LIPITOR) 20 MG tablet TAKE ONE TABLET BY MOUTH DAILY  . Calcium Carbonate (CALCIUM 600 PO) Take 1 tablet by mouth daily.  . celecoxib (CELEBREX) 200 MG capsule Take 1 capsule (200 mg total) by mouth daily.  . diclofenac sodium (VOLTAREN) 1 % GEL APPLY 2 GRAM TO THE AFFECTED AREA(S) BY TOPICAL ROUTE 4 TIMES PER DAY  . dicyclomine (BENTYL) 10 MG capsule Take 1 tab 2-3 times daily for diarrhea/urgency.  . DULoxetine (CYMBALTA) 60 MG capsule TAKE ONE CAPSULE BY MOUTH DAILY  . estradiol (ESTRACE) 1 MG tablet Take 1 tablet (1 mg total) by mouth daily.  . ferrous sulfate 325 (65 FE) MG tablet Take 1 tablet (325 mg total) by mouth 2 (two) times daily with a meal. (Patient taking  differently: Take 325 mg by mouth daily. )  . furosemide (LASIX) 40 MG tablet Take 1 tablet as needed for swelling  . glucose blood test strip Use to check blood sugar once daily  . Lancets 30G MISC Use to check blood sugar once daily  . levothyroxine (SYNTHROID, LEVOTHROID) 25 MCG tablet TAKE ONE TABLET BY MOUTH DAILY  . loratadine (CLARITIN) 10 MG tablet Take 10 mg by mouth daily as needed for allergies.  Marland Kitchen losartan-hydrochlorothiazide (HYZAAR) 50-12.5 MG tablet TAKE ONE TABLET BY MOUTH DAILY  . LYRICA 50 MG capsule TAKE ONE CAPSULE BY MOUTH TWICE DAILY  . metFORMIN (GLUCOPHAGE-XR) 500 MG 24 hr tablet TAKE TWO TABLETS BY MOUTH DAILY WITH BREAKFAST  . metoprolol tartrate (LOPRESSOR) 50 MG tablet TAKE ONE TABLET BY MOUTH TWICE DAILY  . mirtazapine (REMERON) 15 MG tablet Take 0.5 tablets (7.5 mg total) by mouth at bedtime.  . Multiple Vitamins-Minerals (MULTIVITAMIN ADULT PO) Take 1 tablet by mouth daily.   . Multiple Vitamins-Minerals (PRESERVISION AREDS 2 PO) Take by mouth.  Marland Kitchen omeprazole (PRILOSEC) 20 MG capsule TAKE ONE CAPSULE BY MOUTH DAILY  . OVER THE COUNTER MEDICATION CBD Oil  . oxybutynin (DITROPAN-XL)  10 MG 24 hr tablet Take 1 tablet by mouth daily.  . pioglitazone (ACTOS) 15 MG tablet TAKE ONE TABLET BY MOUTH DAILY  . potassium chloride SA (K-DUR,KLOR-CON) 20 MEQ tablet Take 1 tablet (20 mEq total) by mouth daily.  Marland Kitchen tiZANidine (ZANAFLEX) 4 MG tablet Take 1 tablet by mouth every 8 (eight) hours as needed.     Allergies:   Gabapentin and Sulfonamide derivatives   Past Medical History:  Diagnosis Date  . Abdominal pain, right lower quadrant   . Cataracts, both eyes    soon to have cataract surg as of 03/2015  . Diabetes mellitus with complication (HCC)    DPN.  NO diab retinopathy as of 06/2017 eye exam.  . Diabetic peripheral neuropathy associated with type 2 diabetes mellitus ([facility])   . Diverticulosis of colon    noted on colonoscopy 2008  . Dyspnea    a. 09/2011 CTA Chest:  No PE, Ca2+ cors;  b. 09/2011 R&L heart Cath: Relatively nl R heart pressures, nl co/ci, nonobs cad, nl EF.  12/2015 stress test normal, echo with grd II DD--likely explains DOE.  Marland Kitchen External hemorrhoid   . Fatigue    w/ ? excessive daytime somnolence; ? OSA--eval by Dr. Halford Chessman 06/2016, home sleep study ordered.  . Former smoker    30 pack-yrs, quit 2005  . Heart murmur    hx of  . Hot flashes    restarted estradiol 01/2015  . Hx of adenomatous polyp of colon 08/25/2016   07/2016 no recall  . Hyperlipemia, mixed    trigs remain elevated (200s-300s range), but LDL at goal.  . Hypertension   . Hypothyroidism   . IBS (irritable bowel syndrome)    IBS-D (Dr. Carlean Purl) + ? worsened by GI side effect of metformin.  NO pancreatic insufficiency.  As per 09/2017 GI f/u, plan is to continue dicyclomine + f/u with them on prn basis  . Interstitial cystitis   . Liver function study, abnormal   . Lumbar degenerative disc disease    Facet-mediated pain per Dr. Nelva Bush.  Has gotten repeated LB injections, most recently April 2016 in [place].  Eval by Dr. Tonita Cong 03/2017.  Spinal stenosis without neurogenic claudication.  Arthropathy of lumbar facet jt: pt pursuing radiofrequency rhizotomy as of 04/23/17 ortho f/u  . Lumbar scoliosis    lumbosacral spondylosis w/out myelopathy (radiofrequency ablation/lesioning procedure planned for 05/2017).  . Macular degeneration    OS exudative (avastin treatments).  Advanced nonexudative OD, monitor.  AREDS  . Macular hole 07/05/2017   OD; surgically repaired 07/2017.  . Menopausal syndrome   . Morton's neuroma   . Obesity, Class II, BMI 35-39.9   . OSA on CPAP 07/2016   HST 07/27/16 >> AHI 6.2, SaO2 low 79%.  CPAP recommended by pulm  . Osteoarthritis of both hips 2019   surgery NOT recommended ([facility])  . Other specified gastritis without mention of hemorrhage    hx of antral gastritis on EGD 05/2009  . Peripheral neuropathy    ? DPN?--per EMG 11/2012.  Intolerant of  gabapentin, failed pamelor.  Radiofrequency neurotomy 12/11/14 helped LB pain (Dr. Nelva Bush)  . Pneumonia   . Recurrent UTI    on daily antibiotic prophylaxis by her urologist.  . RLS (restless legs syndrome)   . Sleep apnea   . Tarsal tunnel syndrome   . Trochanteric bursitis of both hips 2019   Injections: [facility]  . Unspecified venous (peripheral) insufficiency     Past Surgical History:  Procedure Laterality Date  . ANKLE SURGERY     bilateral  . CARDIAC CATHETERIZATION  09/2011   Nonobstructive CAD  . CARDIOVASCULAR STRESS TEST  01/04/2016   Normal stress nuclear study with no ischemia or infarction; EF 82 with normal wall motion  . CATARACT EXTRACTION    . COLONOSCOPY  2002/2005, 11/14/06, and 05/2009   polyps 2002 but none 2005 or 2008 or 2011.  Repeat 08/17/16 for diarrhea: one diminutive adenoma + small polyps removed, diverticulosis noted--no further colonoscopies needed (Dr. Carlean Purl).  . CT angiogram chest  11/05/2017   NEG for PE.  Marland Kitchen DEXA  10/2016   NORMAL--consider repeat 2 yrs.  . ESOPHAGOGASTRODUODENOSCOPY  05/2009   Moderate antral gastritis  . FOOT SURGERY     R foot bunionectomy and arthroplasty of digits R foot.  Arthroplasty digits 3 and 4 L foot.  Marland Kitchen HAMMER TOE SURGERY     bilateral  . left tarsal tunnel release     sept '11 (Dr Beola Cord)  . PARTIAL KNEE ARTHROPLASTY Right 11/06/2016   Procedure: UNICOMPARTMENTAL RIGHT KNEE- Medially;  Surgeon: Paralee Cancel, MD;  Location: [facility];  Service: Orthopedics;  Laterality: Right;  90 mins  . radiofrequency neurotomy  06/18/2015   bilat L3-4 medial branch nerve and bilat L5 dorsal ramus nerve.(Dr. Nelva Bush)  . TONSILLECTOMY    . TOTAL ABDOMINAL HYSTERECTOMY W/ BILATERAL SALPINGOOPHORECTOMY    . TRANSTHORACIC ECHOCARDIOGRAM  08/2011; 2017   2013 Grade I DD; 2017 EF 60-65%, normal wall motion, grd II DD.     Social History:  The patient  reports that she quit smoking about 15 years ago. Her smoking use included  cigarettes. She has a 35.00 pack-year smoking history. She has never used smokeless tobacco. She reports that she does not drink alcohol or use drugs.   Family History:  The patient's family history includes Breast cancer in her mother; Cancer in her mother; Diabetes in her maternal grandfather; Hyperlipidemia in her father; Hypertension in her father; Mental illness in her brother.    ROS:  Please see the history of present illness. All other systems are reviewed and  Negative to the above problem except as noted.    PHYSICAL EXAM: VS:  BP (!) 124/54   Pulse 71   Ht 5\' 2"  (1.575 m)   Wt 194 lb 1.9 oz (88.1 kg)   SpO2 97%   BMI 35.51 kg/m   GEN: Obese 84 yo , in no acute distress  HEENT: normal  Neck: JVP is normal   No carotid bruits, or masses Cardiac: RRR; no murmurs, rubs, or gallops,Trt edema  Respiratory:  clear to auscultation bilaterally, normal work of breathing  No wheezes  No rales  GI: soft, nontender, nondistended, + BS  No hepatomegaly  MS: no deformi ty Moving all extremities   Skin: warm and dry,Some erythema to calvs   Neuro:  Strength and sensation are intact Psych: euthymic mood, full affect   EKG:  EKG is not  ordered today.   Lipid Panel    Component Value Date/Time   CHOL 154 04/17/2017 0803   TRIG 334 (H) 04/17/2017 0803   HDL 46 (L) 04/17/2017 0803   CHOLHDL 3.3 04/17/2017 0803   VLDL 56 (H) 12/27/2015 1123   LDLCALC 68 04/17/2017 0803   LDLDIRECT 76.0 01/18/2015 0826      Wt Readings from Last 3 Encounters:  12/04/17 194 lb 1.9 oz (88.1 kg)  11/29/17 193 lb 2 oz (87.6 kg)  11/12/17 197  lb 9.6 oz (89.6 kg)      ASSESSMENT AND PLAN:  1 Dyspnea   The pt remains SOB  In fact gettting worse  Given neg work up about 1 years ago I would recomm, unless PFTs profoundly abnormal, that she have R and L heart cath to define   Pt has had mild CAD in past but again, symtpoms contine  / worse    2  HL  Keep on statin   Current medicines are reviewed  at length with the patient today.  The patient does not have concerns regarding medicines.  Risks / benefits explained  Pt undrstands and agrees to proceed.   Signed, Dorris Carnes, MD  12/04/2017 3:45 PM    [address], [place], [place]  [address] Phone: [phone]; Fax: [phone]

## 2017-12-04 NOTE — Patient Instructions (Signed)
Your physician recommends that you continue on your current medications as directed. Please refer to the Current Medication list given to you today.  Your physician recommends that you return for lab work today (BMET, CBC).  Your physician has requested that you have a cardiac catheterization. Cardiac catheterization is used to diagnose and/or treat various heart conditions. Doctors may recommend this procedure for a number of different reasons. The most common reason is to evaluate chest pain. Chest pain can be a symptom of coronary artery disease (CAD), and cardiac catheterization can show whether plaque is narrowing or blocking your heart's arteries. This procedure is also used to evaluate the valves, as well as measure the blood flow and oxygen levels in different parts of your heart. For further information please visit [email or web address]. Please follow instruction sheet, as given.     [facility] [place], [place] [address] Dept: [phone] Loc: [phone]  TORINA EY  12/04/2017  You are scheduled for a Cardiac Catheterization on Friday, September 13 with Dr. Glenetta Hew.  1. Please arrive at the [facility] (Main Entrance A) at [facility]: [address] [place], [place] [address] at 5:30 AM (This time is two hours before your procedure to ensure your preparation). Free valet parking service is available.   Special note: Every effort is made to have your procedure done on time. Please understand that emergencies sometimes delay scheduled procedures.  2. Diet: Do not eat solid foods after midnight.  The patient may have clear liquids until 5am upon the day of the procedure.  3. Labs: You will need to have blood drawn on Tuesday, September 10 at [facility]. [address]  Open: 7:30am - 5pm    Phone: [phone]. You do  not need to be fasting.  4. Medication instructions in preparation for your procedure:   Contrast Allergy: No  Stop taking, Lasix (Furosemide)  on Friday, September 13.  Stop Taking PO Diabetes Meds Glucophage (Metformin)on Friday, September 13.  You will not resume this until Monday September 16.  On the morning of your procedure, take your Aspirin and any morning medicines NOT listed above.  You may use sips of water.  5. Plan for one night stay--bring personal belongings. 6. Bring a current list of your medications and current insurance cards. 7. You MUST have a responsible person to drive you home. 8. Someone MUST be with you the first 24 hours after you arrive home or your discharge will be delayed. 9. Please wear clothes that are easy to get on and off and wear slip-on shoes.  Thank you for allowing [place] to care for you!   -- [facility]

## 2017-12-04 NOTE — Telephone Encounter (Signed)
Patient given detailed instructions per Myocardial Perfusion Study Information Sheet for the test on 12/10/17 at 1030. Patient notified to arrive 15 minutes early and that it is imperative to arrive on time for appointment to keep from having the test rescheduled.  If you need to cancel or reschedule your appointment, please call the office within 24 hours of your appointment. . Patient verbalized understanding.Ashlee Player, Ranae Palms

## 2017-12-04 NOTE — Progress Notes (Signed)
Cardiology Office Note   Date:  12/04/2017   ID:  Katherine Best, DOB 10-13-1933, MRN [id number]  PCP:  Tammi Sou, MD  Cardiologist:   Dorris Carnes, MD    Pt presents for f/u of CAD and SOB     History of Present Illness: Katherine Best is a 84 y.o. female with a history of mld CAD by cath  Normal R heart prssures  Mild diastolic dysfuncton  Also a history of DM   I last saw her last sumbber (2018) She underwent  echo and lexiscan myovue testing  Myovue ws normal  Echo with normal systolic func  Gr II diastolic dysfunction     Since I saw her she continues to be SOB   SHe has was recenetly seen by Dr [name]  Set up for another echo and myovue  She is due to see V Sood   Set for PFTs tomorrow  She says her SOB is getting worse   Denies CP   Does say that she is under increased stress.     Current Meds  Medication Sig  . albuterol (PROVENTIL HFA;VENTOLIN HFA) 108 (90 Base) MCG/ACT inhaler Inhale 2 puffs into the lungs every 6 (six) hours as needed for wheezing or shortness of breath.  . ALPRAZolam (XANAX) 0.25 MG tablet Take 0.25 mg by mouth at bedtime as needed for anxiety.  Marland Kitchen aspirin 81 MG chewable tablet Chew 1 tablet (81 mg total) by mouth 2 (two) times daily. for 4 weeks post-operative  . atorvastatin (LIPITOR) 20 MG tablet TAKE ONE TABLET BY MOUTH DAILY  . Calcium Carbonate (CALCIUM 600 PO) Take 1 tablet by mouth daily.  . celecoxib (CELEBREX) 200 MG capsule Take 1 capsule (200 mg total) by mouth daily.  . diclofenac sodium (VOLTAREN) 1 % GEL APPLY 2 GRAM TO THE AFFECTED AREA(S) BY TOPICAL ROUTE 4 TIMES PER DAY  . dicyclomine (BENTYL) 10 MG capsule Take 1 tab 2-3 times daily for diarrhea/urgency.  . DULoxetine (CYMBALTA) 60 MG capsule TAKE ONE CAPSULE BY MOUTH DAILY  . estradiol (ESTRACE) 1 MG tablet Take 1 tablet (1 mg total) by mouth daily.  . ferrous sulfate 325 (65 FE) MG tablet Take 1 tablet (325 mg total) by mouth 2 (two) times daily with a meal. (Patient taking  differently: Take 325 mg by mouth daily. )  . furosemide (LASIX) 40 MG tablet Take 1 tablet as needed for swelling  . glucose blood test strip Use to check blood sugar once daily  . Lancets 30G MISC Use to check blood sugar once daily  . levothyroxine (SYNTHROID, LEVOTHROID) 25 MCG tablet TAKE ONE TABLET BY MOUTH DAILY  . loratadine (CLARITIN) 10 MG tablet Take 10 mg by mouth daily as needed for allergies.  Marland Kitchen losartan-hydrochlorothiazide (HYZAAR) 50-12.5 MG tablet TAKE ONE TABLET BY MOUTH DAILY  . LYRICA 50 MG capsule TAKE ONE CAPSULE BY MOUTH TWICE DAILY  . metFORMIN (GLUCOPHAGE-XR) 500 MG 24 hr tablet TAKE TWO TABLETS BY MOUTH DAILY WITH BREAKFAST  . metoprolol tartrate (LOPRESSOR) 50 MG tablet TAKE ONE TABLET BY MOUTH TWICE DAILY  . mirtazapine (REMERON) 15 MG tablet Take 0.5 tablets (7.5 mg total) by mouth at bedtime.  . Multiple Vitamins-Minerals (MULTIVITAMIN ADULT PO) Take 1 tablet by mouth daily.   . Multiple Vitamins-Minerals (PRESERVISION AREDS 2 PO) Take by mouth.  Marland Kitchen omeprazole (PRILOSEC) 20 MG capsule TAKE ONE CAPSULE BY MOUTH DAILY  . OVER THE COUNTER MEDICATION CBD Oil  . oxybutynin (DITROPAN-XL)  10 MG 24 hr tablet Take 1 tablet by mouth daily.  . pioglitazone (ACTOS) 15 MG tablet TAKE ONE TABLET BY MOUTH DAILY  . potassium chloride SA (K-DUR,KLOR-CON) 20 MEQ tablet Take 1 tablet (20 mEq total) by mouth daily.  Marland Kitchen tiZANidine (ZANAFLEX) 4 MG tablet Take 1 tablet by mouth every 8 (eight) hours as needed.     Allergies:   Gabapentin and Sulfonamide derivatives   Past Medical History:  Diagnosis Date  . Abdominal pain, right lower quadrant   . Cataracts, both eyes    soon to have cataract surg as of 03/2015  . Diabetes mellitus with complication (HCC)    DPN.  NO diab retinopathy as of 06/2017 eye exam.  . Diabetic peripheral neuropathy associated with type 2 diabetes mellitus ([employer])   . Diverticulosis of colon    noted on colonoscopy 2008  . Dyspnea    a. 09/2011 CTA Chest:  No PE, Ca2+ cors;  b. 09/2011 R&L heart Cath: Relatively nl R heart pressures, nl co/ci, nonobs cad, nl EF.  12/2015 stress test normal, echo with grd II DD--likely explains DOE.  Marland Kitchen External hemorrhoid   . Fatigue    w/ ? excessive daytime somnolence; ? OSA--eval by Dr. Halford Chessman 06/2016, home sleep study ordered.  . Former smoker    30 pack-yrs, quit 2005  . Heart murmur    hx of  . Hot flashes    restarted estradiol 01/2015  . Hx of adenomatous polyp of colon 08/25/2016   07/2016 no recall  . Hyperlipemia, mixed    trigs remain elevated (200s-300s range), but LDL at goal.  . Hypertension   . Hypothyroidism   . IBS (irritable bowel syndrome)    IBS-D (Dr. Carlean Purl) + ? worsened by GI side effect of metformin.  NO pancreatic insufficiency.  As per 09/2017 GI f/u, plan is to continue dicyclomine + f/u with them on prn basis  . Interstitial cystitis   . Liver function study, abnormal   . Lumbar degenerative disc disease    Facet-mediated pain per Dr. Nelva Bush.  Has gotten repeated LB injections, most recently April 2016 in [place].  Eval by Dr. Tonita Cong 03/2017.  Spinal stenosis without neurogenic claudication.  Arthropathy of lumbar facet jt: pt pursuing radiofrequency rhizotomy as of 04/23/17 ortho f/u  . Lumbar scoliosis    lumbosacral spondylosis w/out myelopathy (radiofrequency ablation/lesioning procedure planned for 05/2017).  . Macular degeneration    OS exudative (avastin treatments).  Advanced nonexudative OD, monitor.  AREDS  . Macular hole 07/05/2017   OD; surgically repaired 07/2017.  . Menopausal syndrome   . Morton's neuroma   . Obesity, Class II, BMI 35-39.9   . OSA on CPAP 07/2016   HST 07/27/16 >> AHI 6.2, SaO2 low 79%.  CPAP recommended by pulm  . Osteoarthritis of both hips 2019   surgery NOT recommended ([facility])  . Other specified gastritis without mention of hemorrhage    hx of antral gastritis on EGD 05/2009  . Peripheral neuropathy    ? DPN?--per EMG 11/2012.  Intolerant of  gabapentin, failed pamelor.  Radiofrequency neurotomy 12/11/14 helped LB pain (Dr. Nelva Bush)  . Pneumonia   . Recurrent UTI    on daily antibiotic prophylaxis by her urologist.  . RLS (restless legs syndrome)   . Sleep apnea   . Tarsal tunnel syndrome   . Trochanteric bursitis of both hips 2019   Injections: [facility]  . Unspecified venous (peripheral) insufficiency     Past Surgical History:  Procedure Laterality Date  . ANKLE SURGERY     bilateral  . CARDIAC CATHETERIZATION  09/2011   Nonobstructive CAD  . CARDIOVASCULAR STRESS TEST  01/04/2016   Normal stress nuclear study with no ischemia or infarction; EF 82 with normal wall motion  . CATARACT EXTRACTION    . COLONOSCOPY  2002/2005, 11/14/06, and 05/2009   polyps 2002 but none 2005 or 2008 or 2011.  Repeat 08/17/16 for diarrhea: one diminutive adenoma + small polyps removed, diverticulosis noted--no further colonoscopies needed (Dr. Carlean Purl).  . CT angiogram chest  11/05/2017   NEG for PE.  Marland Kitchen DEXA  10/2016   NORMAL--consider repeat 2 yrs.  . ESOPHAGOGASTRODUODENOSCOPY  05/2009   Moderate antral gastritis  . FOOT SURGERY     R foot bunionectomy and arthroplasty of digits R foot.  Arthroplasty digits 3 and 4 L foot.  Marland Kitchen HAMMER TOE SURGERY     bilateral  . left tarsal tunnel release     sept '11 (Dr Beola Cord)  . PARTIAL KNEE ARTHROPLASTY Right 11/06/2016   Procedure: UNICOMPARTMENTAL RIGHT KNEE- Medially;  Surgeon: Paralee Cancel, MD;  Location: [facility];  Service: Orthopedics;  Laterality: Right;  90 mins  . radiofrequency neurotomy  06/18/2015   bilat L3-4 medial branch nerve and bilat L5 dorsal ramus nerve.(Dr. Nelva Bush)  . TONSILLECTOMY    . TOTAL ABDOMINAL HYSTERECTOMY W/ BILATERAL SALPINGOOPHORECTOMY    . TRANSTHORACIC ECHOCARDIOGRAM  08/2011; 2017   2013 Grade I DD; 2017 EF 60-65%, normal wall motion, grd II DD.     Social History:  The patient  reports that she quit smoking about 15 years ago. Her smoking use included  cigarettes. She has a 35.00 pack-year smoking history. She has never used smokeless tobacco. She reports that she does not drink alcohol or use drugs.   Family History:  The patient's family history includes Breast cancer in her mother; Cancer in her mother; Diabetes in her maternal grandfather; Hyperlipidemia in her father; Hypertension in her father; Mental illness in her brother.    ROS:  Please see the history of present illness. All other systems are reviewed and  Negative to the above problem except as noted.    PHYSICAL EXAM: VS:  BP (!) 124/54   Pulse 71   Ht 5\' 2"  (1.575 m)   Wt 194 lb 1.9 oz (88.1 kg)   SpO2 97%   BMI 35.51 kg/m   GEN: Obese 84 yo , in no acute distress  HEENT: normal  Neck: JVP is normal   No carotid bruits, or masses Cardiac: RRR; no murmurs, rubs, or gallops,Trt edema  Respiratory:  clear to auscultation bilaterally, normal work of breathing  No wheezes  No rales  GI: soft, nontender, nondistended, + BS  No hepatomegaly  MS: no deformi ty Moving all extremities   Skin: warm and dry,Some erythema to calvs   Neuro:  Strength and sensation are intact Psych: euthymic mood, full affect   EKG:  EKG is not  ordered today.   Lipid Panel    Component Value Date/Time   CHOL 154 04/17/2017 0803   TRIG 334 (H) 04/17/2017 0803   HDL 46 (L) 04/17/2017 0803   CHOLHDL 3.3 04/17/2017 0803   VLDL 56 (H) 12/27/2015 1123   LDLCALC 68 04/17/2017 0803   LDLDIRECT 76.0 01/18/2015 0826      Wt Readings from Last 3 Encounters:  12/04/17 194 lb 1.9 oz (88.1 kg)  11/29/17 193 lb 2 oz (87.6 kg)  11/12/17 197  lb 9.6 oz (89.6 kg)      ASSESSMENT AND PLAN:  1 Dyspnea   The pt remains SOB  In fact gettting worse  Given neg work up about 1 years ago I would recomm, unless PFTs profoundly abnormal, that she have R and L heart cath to define   Pt has had mild CAD in past but again, symtpoms contine  / worse    2  HL  Keep on statin   Current medicines are reviewed  at length with the patient today.  The patient does not have concerns regarding medicines.  Risks / benefits explained  Pt undrstands and agrees to proceed.   Signed, Dorris Carnes, MD  12/04/2017 3:45 PM    [address], [place], [place]  [address] Phone: [phone]; Fax: [phone]

## 2017-12-05 ENCOUNTER — Ambulatory Visit (INDEPENDENT_AMBULATORY_CARE_PROVIDER_SITE_OTHER): Payer: Medicare Other | Admitting: Pulmonary Disease

## 2017-12-05 ENCOUNTER — Ambulatory Visit (INDEPENDENT_AMBULATORY_CARE_PROVIDER_SITE_OTHER): Payer: Medicare Other | Admitting: Primary Care

## 2017-12-05 ENCOUNTER — Encounter: Payer: Self-pay | Admitting: Family Medicine

## 2017-12-05 ENCOUNTER — Telehealth: Payer: Self-pay | Admitting: Primary Care

## 2017-12-05 VITALS — BP 134/64 | HR 64 | Ht 61.0 in | Wt 193.0 lb

## 2017-12-05 DIAGNOSIS — G4733 Obstructive sleep apnea (adult) (pediatric): Secondary | ICD-10-CM | POA: Diagnosis not present

## 2017-12-05 DIAGNOSIS — R0609 Other forms of dyspnea: Secondary | ICD-10-CM | POA: Diagnosis not present

## 2017-12-05 HISTORY — PX: OTHER SURGICAL HISTORY: SHX169

## 2017-12-05 LAB — CBC
Hematocrit: 36.5 % (ref 34.0–46.6)
Hemoglobin: 12.3 g/dL (ref 11.1–15.9)
MCH: 31.3 pg (ref 26.6–33.0)
MCHC: 33.7 g/dL (ref 31.5–35.7)
MCV: 93 fL (ref 79–97)
Platelets: 324 10*3/uL (ref 150–450)
RBC: 3.93 x10E6/uL (ref 3.77–5.28)
RDW: 13 % (ref 12.3–15.4)
WBC: 7.7 10*3/uL (ref 3.4–10.8)

## 2017-12-05 LAB — BASIC METABOLIC PANEL
BUN / CREAT RATIO: 25 (ref 12–28)
BUN: 21 mg/dL (ref 8–27)
CALCIUM: 9.5 mg/dL (ref 8.7–10.3)
CHLORIDE: 98 mmol/L (ref 96–106)
CO2: 26 mmol/L (ref 20–29)
CREATININE: 0.83 mg/dL (ref 0.57–1.00)
GFR calc Af Amer: 76 mL/min/{1.73_m2} (ref >59)
GFR calc non Af Amer: 66 mL/min/{1.73_m2} (ref >59)
GLUCOSE: 119 mg/dL — AB (ref 65–99)
Potassium: 4.1 mmol/L (ref 3.5–5.2)
Sodium: 146 mmol/L — ABNORMAL HIGH (ref 134–144)

## 2017-12-05 LAB — PULMONARY FUNCTION TEST
DL/VA % PRED: 90 %
DL/VA: 3.96 ml/min/mmHg/L
DLCO COR: 11.92 ml/min/mmHg
DLCO UNC % PRED: 56 %
DLCO UNC: 11.49 ml/min/mmHg
DLCO cor % pred: 58 %
FEF 25-75 PRE: 1.6 L/s
FEF 25-75 Post: 2.33 L/sec
FEF2575-%CHANGE-POST: 45 %
FEF2575-%PRED-PRE: 139 %
FEF2575-%Pred-Post: 202 %
FEV1-%Change-Post: 6 %
FEV1-%PRED-POST: 89 %
FEV1-%PRED-PRE: 84 %
FEV1-POST: 1.43 L
FEV1-Pre: 1.35 L
FEV1FVC-%Change-Post: 6 %
FEV1FVC-%Pred-Pre: 114 %
FEV6-%CHANGE-POST: 0 %
FEV6-%PRED-POST: 78 %
FEV6-%Pred-Pre: 79 %
FEV6-PRE: 1.61 L
FEV6-Post: 1.61 L
FEV6FVC-%PRED-PRE: 106 %
FEV6FVC-%Pred-Post: 106 %
FVC-%Change-Post: 0 %
FVC-%Pred-Post: 73 %
FVC-%Pred-Pre: 74 %
FVC-Post: 1.61 L
FVC-Pre: 1.61 L
POST FEV1/FVC RATIO: 89 %
Post FEV6/FVC ratio: 100 %
Pre FEV1/FVC ratio: 83 %
Pre FEV6/FVC Ratio: 100 %
RV % pred: 59 %
RV: 1.36 L
TLC % PRED: 63 %
TLC: 2.93 L

## 2017-12-05 NOTE — Assessment & Plan Note (Signed)
-   HST in May 2018 showed mild OSA, AHI 6.2 - Patient reports increased energy with use. Stopped/returned machine because cpap mask was bothering her  - Resume CPAP, new order sent to DME company - Auto titrate 5-15cm H20, mask of choice, humidification and supplies - FU in 3 months with Dr. Halford Chessman

## 2017-12-05 NOTE — Patient Instructions (Addendum)
PFT showed no obstruction, mild restriction.   New CPAP start with advance [employer] company Had HST on 07/2016 showing mild OSA Auto titrate 5-15cm H20. Mask of choice, humidified o2 and supplies  Heart cath as planned on Friday   Please schedule fu visit in 3 months with Dr. Halford Chessman

## 2017-12-05 NOTE — Progress Notes (Signed)
PFT done today. 

## 2017-12-05 NOTE — Assessment & Plan Note (Addendum)
-   CTA in Aug 2019 - No evidence of PE, clear lungs - PFTs completed today showing no obstruction, mild restriction. No reversibility. Moderate DLCO defect  - FVC 1.61 (73%), FEV1 1.43 (89%), ratio 89. DLCO 58% - Continue Albuterol rescue inhaler prn sob/wheeze

## 2017-12-05 NOTE — Telephone Encounter (Signed)
Error, please disregard.

## 2017-12-05 NOTE — Telephone Encounter (Signed)
Spoke with patient. She is ok with starting all over again with a sleep study. Advised her that she should receive a call soon to get this scheduled.   Spoke with Beth in regards to what type of study she wanted. She advised a HST. Order has been placed. Nothing further needed at time of call.

## 2017-12-05 NOTE — Assessment & Plan Note (Addendum)
-   CTA in Aug 2019 showed coronary atery atherosclerosis to LAD, circumflex and RCA  - Intermittent chest pain and dyspnea  - Scheduled for cardiac cath on Friday Sept 13th

## 2017-12-05 NOTE — Telephone Encounter (Signed)
Called and spoke with Corene Cornea he stated that the patient turned her CPAP in last May due to her being non compliant. Patient will need to start completely over with a sleep study.   Beth are you ok with [place] sending an order in for that. Please advise, thank you.

## 2017-12-05 NOTE — Progress Notes (Signed)
@Patient  ID: Katherine Best, female    DOB: 20-Dec-1933, 84 y.o.   MRN: [id number]  Chief Complaint  Patient presents with  . Follow-up    SOB, dry cough    Referring provider: Tammi Sou, MD  HPI: 84 year old female, former smoker. PMH hypertension, CAD, diastolic dysfunction, diabetes, OSA. Patient of Dr. Halford Chessman, last seen 11/12/17 for dyspnea. HST in May 2018 showed mild OSA, AHI 6.2.   12/05/2017  Patient presents today for 2-4 week fu for dyspnea. Complains of sob with exertion and when bending forward. Associated mild dry cough. Occasional intermittent cp. Not on maintenance inhaler. Uses rescue inhaler twice a day with reported improvement in breathing. Patient stopped wearing her CPAP 6 months ago because it was "getting on her nerves". She reports more energy with use and would like to resume cpap treatment. PFTs today showed no obstruction, mild restriction. No reversibility. Moderate DLCO defect. CTA in Aug 2019 showed no PE. Clear lungs. She saw her cardiologist/Dr. Dorris Carnes, planning for cardiac catheterization  this Friday. Denies cp or wheeze.     Allergies  Allergen Reactions  . Gabapentin Itching  . Sulfonamide Derivatives     REACTION: Nausea    Immunization History  Administered Date(s) Administered  . Influenza Split 12/06/2011  . Influenza Whole 01/25/2001, 01/08/2009, 01/25/2009, 12/01/2009, 11/26/2010, 11/26/2011  . Influenza, High Dose Seasonal PF 01/30/2013, 01/14/2014, 12/30/2014, 12/17/2015, 01/11/2017, 11/29/2017  . Pneumococcal Conjugate-13 09/02/2014  . Pneumococcal Polysaccharide-23 01/25/2001, 06/25/2008, 01/10/2012  . Pneumococcal-Unspecified 02/25/2016  . Tdap 06/14/2010  . Zoster 06/26/2007  . Zoster Recombinat (Shingrix) 11/15/2016, 04/13/2017    Past Medical History:  Diagnosis Date  . Abdominal pain, right lower quadrant   . Cataracts, both eyes    soon to have cataract surg as of 03/2015  . Diabetes mellitus with complication  (HCC)    DPN.  NO diab retinopathy as of 06/2017 eye exam.  . Diabetic peripheral neuropathy associated with type 2 diabetes mellitus ([place])   . Diverticulosis of colon    noted on colonoscopy 2008  . Dyspnea    a. 09/2011 CTA Chest: No PE, Ca2+ cors;  b. 09/2011 R&L heart Cath: Relatively nl R heart pressures, nl co/ci, nonobs cad, nl EF.  12/2015 stress test normal, echo with grd II DD--likely explains DOE.  Worsening chronic DOE 2019-->cath planned per Dr. Harrington Challenger as of 12/05/17.  Marland Kitchen External hemorrhoid   . Fatigue    w/ ? excessive daytime somnolence; ? OSA--eval by Dr. Halford Chessman 06/2016, home sleep study ordered.  . Former smoker    30 pack-yrs, quit 2005  . Heart murmur    hx of  . Hot flashes    restarted estradiol 01/2015  . Hx of adenomatous polyp of colon 08/25/2016   07/2016 no recall  . Hyperlipemia, mixed    trigs remain elevated (200s-300s range), but LDL at goal.  . Hypertension   . Hypothyroidism   . IBS (irritable bowel syndrome)    IBS-D (Dr. Carlean Purl) + ? worsened by GI side effect of metformin.  NO pancreatic insufficiency.  As per 09/2017 GI f/u, plan is to continue dicyclomine + f/u with them on prn basis  . Interstitial cystitis   . Liver function study, abnormal   . Lumbar degenerative disc disease    Facet-mediated pain per Dr. Nelva Bush.  Has gotten repeated LB injections, most recently April 2016 in [place].  Eval by Dr. Tonita Cong 03/2017.  Spinal stenosis without neurogenic claudication.  Arthropathy of  lumbar facet jt: pt pursuing radiofrequency rhizotomy as of 04/23/17 ortho f/u  . Lumbar scoliosis    lumbosacral spondylosis w/out myelopathy (radiofrequency ablation/lesioning procedure planned for 05/2017).  . Macular degeneration    OS exudative (avastin treatments).  Advanced nonexudative OD, monitor.  AREDS  . Macular hole 07/05/2017   OD; surgically repaired 07/2017.  . Menopausal syndrome   . Morton's neuroma   . Obesity, Class II, BMI 35-39.9   . OSA on CPAP 07/2016   HST  07/27/16 >> AHI 6.2, SaO2 low 79%.  CPAP recommended by pulm  . Osteoarthritis of both hips 2019   surgery NOT recommended ([facility])  . Other specified gastritis without mention of hemorrhage    hx of antral gastritis on EGD 05/2009  . Peripheral neuropathy    ? DPN?--per EMG 11/2012.  Intolerant of gabapentin, failed pamelor.  Radiofrequency neurotomy 12/11/14 helped LB pain (Dr. Nelva Bush)  . Pneumonia   . Recurrent UTI    on daily antibiotic prophylaxis by her urologist.  . RLS (restless legs syndrome)   . Sleep apnea   . Tarsal tunnel syndrome   . Trochanteric bursitis of both hips 2019   Injections: [facility]  . Unspecified venous (peripheral) insufficiency     Tobacco History: Social History   Tobacco Use  Smoking Status Former Smoker  . Packs/day: 1.00  . Years: 35.00  . Pack years: 35.00  . Types: Cigarettes  . Last attempt to quit: 03/27/2002  . Years since quitting: 15.7  Smokeless Tobacco Never Used   Counseling given: Not Answered   Outpatient Medications Prior to Visit  Medication Sig Dispense Refill  . albuterol (PROVENTIL HFA;VENTOLIN HFA) 108 (90 Base) MCG/ACT inhaler Inhale 2 puffs into the lungs every 6 (six) hours as needed for wheezing or shortness of breath. 1 Inhaler 1  . ALPRAZolam (XANAX) 0.25 MG tablet Take 0.25 mg by mouth at bedtime as needed for anxiety.    Marland Kitchen aspirin 81 MG chewable tablet Chew 1 tablet (81 mg total) by mouth 2 (two) times daily. for 4 weeks post-operative 60 tablet 0  . atorvastatin (LIPITOR) 20 MG tablet TAKE ONE TABLET BY MOUTH DAILY 90 tablet 1  . Calcium Carbonate (CALCIUM 600 PO) Take 1 tablet by mouth daily.    . celecoxib (CELEBREX) 200 MG capsule Take 1 capsule (200 mg total) by mouth daily. 90 capsule 1  . diclofenac sodium (VOLTAREN) 1 % GEL APPLY 2 GRAM TO THE AFFECTED AREA(S) BY TOPICAL ROUTE 4 TIMES PER DAY  1  . dicyclomine (BENTYL) 10 MG capsule Take 1 tab 2-3 times daily for diarrhea/urgency. 90 capsule 6  .  DULoxetine (CYMBALTA) 60 MG capsule TAKE ONE CAPSULE BY MOUTH DAILY 90 capsule 1  . estradiol (ESTRACE) 1 MG tablet Take 1 tablet (1 mg total) by mouth daily. 90 tablet 3  . ferrous sulfate 325 (65 FE) MG tablet Take 1 tablet (325 mg total) by mouth 2 (two) times daily with a meal. (Patient taking differently: Take 325 mg by mouth daily. ) 30 tablet 0  . furosemide (LASIX) 40 MG tablet Take 1 tablet as needed for swelling 90 tablet 1  . glucose blood test strip Use to check blood sugar once daily 100 each 11  . Lancets 30G MISC Use to check blood sugar once daily 100 each 11  . levothyroxine (SYNTHROID, LEVOTHROID) 25 MCG tablet TAKE ONE TABLET BY MOUTH DAILY 90 tablet 1  . loratadine (CLARITIN) 10 MG tablet Take 10 mg by mouth  daily as needed for allergies.    Marland Kitchen losartan-hydrochlorothiazide (HYZAAR) 50-12.5 MG tablet TAKE ONE TABLET BY MOUTH DAILY 90 tablet 1  . LYRICA 50 MG capsule TAKE ONE CAPSULE BY MOUTH TWICE DAILY 180 capsule 1  . metFORMIN (GLUCOPHAGE-XR) 500 MG 24 hr tablet TAKE TWO TABLETS BY MOUTH DAILY WITH BREAKFAST 180 tablet 1  . metoprolol tartrate (LOPRESSOR) 50 MG tablet TAKE ONE TABLET BY MOUTH TWICE DAILY 180 tablet 1  . mirtazapine (REMERON) 15 MG tablet Take 0.5 tablets (7.5 mg total) by mouth at bedtime. 90 tablet 1  . Multiple Vitamins-Minerals (MULTIVITAMIN ADULT PO) Take 1 tablet by mouth daily.     . Multiple Vitamins-Minerals (PRESERVISION AREDS 2 PO) Take by mouth.    Marland Kitchen omeprazole (PRILOSEC) 20 MG capsule TAKE ONE CAPSULE BY MOUTH DAILY 90 capsule 1  . OVER THE COUNTER MEDICATION CBD Oil    . oxybutynin (DITROPAN-XL) 10 MG 24 hr tablet Take 1 tablet by mouth daily.    . pioglitazone (ACTOS) 15 MG tablet TAKE ONE TABLET BY MOUTH DAILY 90 tablet 1  . potassium chloride SA (K-DUR,KLOR-CON) 20 MEQ tablet Take 1 tablet (20 mEq total) by mouth daily. 90 tablet 3  . tiZANidine (ZANAFLEX) 4 MG tablet Take 1 tablet by mouth every 8 (eight) hours as needed.  1   No  facility-administered medications prior to visit.     Review of Systems  Review of Systems  Constitutional: Positive for fatigue.  HENT: Negative.   Respiratory: Positive for cough and shortness of breath. Negative for wheezing.   Cardiovascular: Negative.  Negative for chest pain, palpitations and leg swelling.   Physical Exam  BP 134/64 (BP Location: Right Arm, Cuff Size: Normal)   Pulse 64   Ht 5\' 1"  (1.549 m)   Wt 193 lb (87.5 kg)   SpO2 95%   BMI 36.47 kg/m  Physical Exam  Constitutional: She is oriented to person, place, and time. She appears well-developed and well-nourished. No distress.  HENT:  Head: Normocephalic and atraumatic.  Eyes: Pupils are equal, round, and reactive to light. EOM are normal.  Neck: Normal range of motion. Neck supple.  Cardiovascular: Normal rate and regular rhythm.  Trace to no leg edema   Pulmonary/Chest: Effort normal. No respiratory distress. She has no wheezes. She has rales.  Musculoskeletal: Normal range of motion.  Neurological: She is alert and oriented to person, place, and time.  Skin: Skin is warm and dry.  Psychiatric: She has a normal mood and affect. Her behavior is normal. Judgment and thought content normal.     Lab Results:  CBC    Component Value Date/Time   WBC 7.7 12/04/2017 1629   WBC 8.0 11/01/2017 1103   RBC 3.93 12/04/2017 1629   RBC 3.73 (L) 11/01/2017 1103   HGB 12.3 12/04/2017 1629   HCT 36.5 12/04/2017 1629   PLT 324 12/04/2017 1629   MCV 93 12/04/2017 1629   MCH 31.3 12/04/2017 1629   MCH 30.8 11/07/2016 0543   MCHC 33.7 12/04/2017 1629   MCHC 33.3 11/01/2017 1103   RDW 13.0 12/04/2017 1629   LYMPHSABS 1.5 11/01/2017 1103   MONOABS 0.8 11/01/2017 1103   EOSABS 0.2 11/01/2017 1103   BASOSABS 0.1 11/01/2017 1103    BMET    Component Value Date/Time   NA 146 (H) 12/04/2017 1629   K 4.1 12/04/2017 1629   CL 98 12/04/2017 1629   CO2 26 12/04/2017 1629   GLUCOSE 119 (H) 12/04/2017 1629  GLUCOSE 118 (H) 11/01/2017 1103   BUN 21 12/04/2017 1629   CREATININE 0.83 12/04/2017 1629   CREATININE 0.72 04/17/2017 0803   CALCIUM 9.5 12/04/2017 1629   GFRNONAA 66 12/04/2017 1629   GFRAA 76 12/04/2017 1629    BNP No results found for: BNP  ProBNP    Component Value Date/Time   PROBNP 38.0 09/25/2011 1114    Imaging: Ct Angio Chest Pe W Or Wo Contrast  Result Date: 11/05/2017 CLINICAL DATA:  Shortness of breath EXAM: CT ANGIOGRAPHY CHEST WITH CONTRAST TECHNIQUE: Multidetector CT imaging of the chest was performed using the standard protocol during bolus administration of intravenous contrast. Multiplanar CT image reconstructions and MIPs were obtained to evaluate the vascular anatomy. CONTRAST:  28mL ISOVUE-370 IOPAMIDOL (ISOVUE-370) INJECTION 76% COMPARISON:  None. FINDINGS: Cardiovascular: Satisfactory opacification of the pulmonary arteries to the segmental level. No evidence of pulmonary embolism. Normal heart size. No pericardial effusion. Thoracic aortic atherosclerosis. Coronary artery atherosclerosis in the LAD, circumflex and RCA. Mediastinum/Nodes: No axillary lymphadenopathy. 9 mm subcarinal lymph node. 9 mm left hilar lymph. Thyroid gland, trachea, and esophagus demonstrate no significant findings. Lungs/Pleura: Lungs are clear. No pleural effusion or pneumothorax. Upper Abdomen: No acute abnormality. Musculoskeletal: No chest wall abnormality. No acute or significant osseous findings. Review of the MIP images confirms the above findings. IMPRESSION: 1. No evidence of pulmonary embolus. 2.  Aortic Atherosclerosis (ICD10-I70.0). Electronically Signed   By: Kathreen Devoid   On: 11/05/2017 16:40     Assessment & Plan:   CAD (coronary artery disease) - CTA in Aug 2019 showed coronary atery atherosclerosis to LAD, circumflex and RCA  - Intermittent chest pain and dyspnea  - Scheduled for cardiac cath on Friday Sept 13th   OSA (obstructive sleep apnea) - HST in May 2018  showed mild OSA, AHI 6.2 - Patient reports increased energy with use. Stopped/returned machine because cpap mask was bothering her  - Resume CPAP, new order sent to DME company - Auto titrate 5-15cm H20, mask of choice, humidification and supplies - FU in 3 months with Dr. Halford Chessman   Dyspnea on exertion - CTA in Aug 2019 - No evidence of PE, clear lungs - PFTs completed today showing no obstruction, mild restriction. No reversibility. Moderate DLCO defect  - FVC 1.61 (73%), FEV1 1.43 (89%), ratio 89. DLCO 58% - Continue Albuterol rescue inhaler prn sob/wheeze      Martyn Ehrich, NP 12/05/2017

## 2017-12-05 NOTE — Telephone Encounter (Signed)
Yes that's fine 

## 2017-12-06 ENCOUNTER — Telehealth: Payer: Self-pay | Admitting: *Deleted

## 2017-12-06 NOTE — Telephone Encounter (Signed)
Pt contacted pre-catheterization scheduled at [facility] for: Friday December 07, 2017 7:30 AM Verified arrival time and place: [facility] Entrance A at: 5:30 AM  No solid food after midnight prior to cath, clear liquids until 5 AM day of procedure. Verified allergies in Epic  Hold: Metformin-day of procedure and 48 hours post procedure. Actos-AM of procedure Losartan-HCTZ-AM of procedure. KCl -AM of procedure Furosemide-AM of procedure  Except hold medications AM meds can be  taken pre-cath with sip of water including: ASA 81 mg  Confirmed patient has responsible person to drive home post procedure and for 24 hours after you arrive home: yes

## 2017-12-07 ENCOUNTER — Encounter (HOSPITAL_COMMUNITY): Payer: Self-pay | Admitting: Cardiology

## 2017-12-07 ENCOUNTER — Ambulatory Visit (HOSPITAL_COMMUNITY)
Admission: RE | Disposition: A | Discharge status: Home / Self Care | Payer: Self-pay | Source: Ambulatory Visit | Attending: Cardiology

## 2017-12-07 ENCOUNTER — Ambulatory Visit (HOSPITAL_COMMUNITY)
Admission: RE | Admit: 2017-12-07 | Discharge: 2017-12-07 | Disposition: A | Discharge status: Home / Self Care | Payer: Medicare Other | Source: Ambulatory Visit | Attending: Cardiology | Admitting: Cardiology

## 2017-12-07 ENCOUNTER — Other Ambulatory Visit: Payer: Self-pay

## 2017-12-07 DIAGNOSIS — K589 Irritable bowel syndrome without diarrhea: Secondary | ICD-10-CM | POA: Insufficient documentation

## 2017-12-07 DIAGNOSIS — E782 Mixed hyperlipidemia: Secondary | ICD-10-CM | POA: Diagnosis not present

## 2017-12-07 DIAGNOSIS — R0609 Other forms of dyspnea: Secondary | ICD-10-CM | POA: Diagnosis not present

## 2017-12-07 DIAGNOSIS — Z87891 Personal history of nicotine dependence: Secondary | ICD-10-CM | POA: Insufficient documentation

## 2017-12-07 DIAGNOSIS — M48061 Spinal stenosis, lumbar region without neurogenic claudication: Secondary | ICD-10-CM | POA: Insufficient documentation

## 2017-12-07 DIAGNOSIS — M5136 Other intervertebral disc degeneration, lumbar region: Secondary | ICD-10-CM | POA: Insufficient documentation

## 2017-12-07 DIAGNOSIS — Z882 Allergy status to sulfonamides status: Secondary | ICD-10-CM | POA: Insufficient documentation

## 2017-12-07 DIAGNOSIS — E039 Hypothyroidism, unspecified: Secondary | ICD-10-CM | POA: Diagnosis not present

## 2017-12-07 DIAGNOSIS — Z7984 Long term (current) use of oral hypoglycemic drugs: Secondary | ICD-10-CM | POA: Diagnosis not present

## 2017-12-07 DIAGNOSIS — M160 Bilateral primary osteoarthritis of hip: Secondary | ICD-10-CM | POA: Insufficient documentation

## 2017-12-07 DIAGNOSIS — E669 Obesity, unspecified: Secondary | ICD-10-CM | POA: Insufficient documentation

## 2017-12-07 DIAGNOSIS — I272 Pulmonary hypertension, unspecified: Secondary | ICD-10-CM | POA: Diagnosis not present

## 2017-12-07 DIAGNOSIS — Z7982 Long term (current) use of aspirin: Secondary | ICD-10-CM | POA: Insufficient documentation

## 2017-12-07 DIAGNOSIS — G2581 Restless legs syndrome: Secondary | ICD-10-CM | POA: Insufficient documentation

## 2017-12-07 DIAGNOSIS — I10 Essential (primary) hypertension: Secondary | ICD-10-CM | POA: Diagnosis not present

## 2017-12-07 DIAGNOSIS — I2510 Atherosclerotic heart disease of native coronary artery without angina pectoris: Secondary | ICD-10-CM

## 2017-12-07 DIAGNOSIS — Z6835 Body mass index (BMI) 35.0-35.9, adult: Secondary | ICD-10-CM | POA: Insufficient documentation

## 2017-12-07 DIAGNOSIS — E1142 Type 2 diabetes mellitus with diabetic polyneuropathy: Secondary | ICD-10-CM | POA: Diagnosis not present

## 2017-12-07 DIAGNOSIS — G4733 Obstructive sleep apnea (adult) (pediatric): Secondary | ICD-10-CM | POA: Insufficient documentation

## 2017-12-07 HISTORY — PX: RIGHT/LEFT HEART CATH AND CORONARY ANGIOGRAPHY: CATH118266

## 2017-12-07 LAB — GLUCOSE, CAPILLARY
Glucose-Capillary: 90 mg/dL (ref 70–99)
Glucose-Capillary: 107 mg/dL — ABNORMAL HIGH (ref 70–99)

## 2017-12-07 LAB — POCT I-STAT 3, ART BLOOD GAS (G3+)
Acid-Base Excess: 1 mmol/L (ref 0.0–2.0)
BICARBONATE: 27.4 mmol/L (ref 20.0–28.0)
O2 Saturation: 95 %
PCO2 ART: 49.2 mmHg — AB (ref 32.0–48.0)
PH ART: 7.354 (ref 7.350–7.450)
PO2 ART: 83 mmHg (ref 83.0–108.0)
TCO2: 29 mmol/L (ref 22–32)

## 2017-12-07 LAB — POCT I-STAT 3, VENOUS BLOOD GAS (G3P V)
Acid-Base Excess: 2 mmol/L (ref 0.0–2.0)
Acid-Base Excess: 3 mmol/L — ABNORMAL HIGH (ref 0.0–2.0)
BICARBONATE: 28.7 mmol/L — AB (ref 20.0–28.0)
Bicarbonate: 30 mmol/L — ABNORMAL HIGH (ref 20.0–28.0)
O2 SAT: 76 %
O2 SAT: 74 %
PO2 VEN: 44 mmHg (ref 32.0–45.0)
TCO2: 30 mmol/L (ref 22–32)
TCO2: 32 mmol/L (ref 22–32)
pCO2, Ven: 51.6 mmHg (ref 44.0–60.0)
pCO2, Ven: 53.8 mmHg (ref 44.0–60.0)
pH, Ven: 7.353 (ref 7.250–7.430)
pH, Ven: 7.355 (ref 7.250–7.430)
pO2, Ven: 42 mmHg (ref 32.0–45.0)

## 2017-12-07 LAB — POCT ACTIVATED CLOTTING TIME: Activated Clotting Time: 191 seconds

## 2017-12-07 SURGERY — RIGHT/LEFT HEART CATH AND CORONARY ANGIOGRAPHY
Anesthesia: LOCAL

## 2017-12-07 MED ORDER — ONDANSETRON HCL 4 MG/2ML IJ SOLN: 4.0000 mg | Freq: Four times a day (QID) | INTRAMUSCULAR | Status: DC | PRN

## 2017-12-07 MED ORDER — SODIUM CHLORIDE 0.9 % WEIGHT BASED INFUSION: 1.0000 mL/kg/h | INTRAVENOUS | Status: DC

## 2017-12-07 MED ORDER — VERAPAMIL HCL 2.5 MG/ML IV SOLN
INTRAVENOUS | Status: AC
  Filled 2017-12-07: qty 2

## 2017-12-07 MED ORDER — HEPARIN (PORCINE) IN NACL 1000-0.9 UT/500ML-% IV SOLN
INTRAVENOUS | Status: AC
  Filled 2017-12-07: qty 1000

## 2017-12-07 MED ORDER — SODIUM CHLORIDE 0.9 % WEIGHT BASED INFUSION: 3.0000 mL/kg/h | INTRAVENOUS | Status: AC

## 2017-12-07 MED ORDER — HEPARIN SODIUM (PORCINE) 1000 UNIT/ML IJ SOLN
INTRAMUSCULAR | Status: AC
  Filled 2017-12-07: qty 1

## 2017-12-07 MED ORDER — SODIUM CHLORIDE 0.9 % IV SOLN: 250.0000 mL | INTRAVENOUS | Status: DC | PRN

## 2017-12-07 MED ORDER — SODIUM CHLORIDE 0.9 % IV SOLN: INTRAVENOUS | Status: DC

## 2017-12-07 MED ORDER — SODIUM CHLORIDE 0.9% FLUSH: 3.0000 mL | Freq: Two times a day (BID) | INTRAVENOUS | Status: DC

## 2017-12-07 MED ORDER — SODIUM CHLORIDE 0.9% FLUSH: 3.0000 mL | INTRAVENOUS | Status: DC | PRN

## 2017-12-07 MED ORDER — ASPIRIN 81 MG PO CHEW: 81.0000 mg | CHEWABLE_TABLET | ORAL | Status: DC

## 2017-12-07 MED ORDER — VERAPAMIL HCL 2.5 MG/ML IV SOLN
INTRAVENOUS | Status: DC | PRN
  Administered 2017-12-07: 10 mL via INTRA_ARTERIAL

## 2017-12-07 MED ORDER — FENTANYL CITRATE (PF) 100 MCG/2ML IJ SOLN
INTRAMUSCULAR | Status: AC
  Filled 2017-12-07: qty 2

## 2017-12-07 MED ORDER — LIDOCAINE HCL (PF) 1 % IJ SOLN
INTRAMUSCULAR | Status: AC
  Filled 2017-12-07: qty 30

## 2017-12-07 MED ORDER — IOHEXOL 350 MG/ML SOLN
INTRAVENOUS | Status: DC | PRN
  Administered 2017-12-07: 80 mL via INTRAVENOUS

## 2017-12-07 MED ORDER — ACETAMINOPHEN 325 MG PO TABS: 650.0000 mg | ORAL_TABLET | ORAL | Status: DC | PRN

## 2017-12-07 MED ORDER — FENTANYL CITRATE (PF) 100 MCG/2ML IJ SOLN
INTRAMUSCULAR | Status: DC | PRN
  Administered 2017-12-07: 25 ug via INTRAVENOUS

## 2017-12-07 MED ORDER — LIDOCAINE HCL (PF) 1 % IJ SOLN
INTRAMUSCULAR | Status: DC | PRN
  Administered 2017-12-07: 15 mL
  Administered 2017-12-07 (×2): 2 mL

## 2017-12-07 MED ORDER — ASPIRIN 81 MG PO CHEW: 81.0000 mg | CHEWABLE_TABLET | ORAL | Status: AC

## 2017-12-07 MED ORDER — MIDAZOLAM HCL 2 MG/2ML IJ SOLN
INTRAMUSCULAR | Status: DC | PRN
  Administered 2017-12-07: 1 mg via INTRAVENOUS

## 2017-12-07 MED ORDER — MIDAZOLAM HCL 2 MG/2ML IJ SOLN
INTRAMUSCULAR | Status: AC
  Filled 2017-12-07: qty 2

## 2017-12-07 SURGICAL SUPPLY — 14 items
CATH OPTITORQUE TIG 4.0 5F (CATHETERS) ×2 IMPLANT
CATH SWAN GANZ 7F STRAIGHT (CATHETERS) ×1 IMPLANT
DEVICE RAD COMP TR BAND LRG (VASCULAR PRODUCTS) ×1 IMPLANT
GLIDESHEATH SLEND SS 6F .021 (SHEATH) ×1 IMPLANT
GUIDEWIRE INQWIRE 1.5J.035X260 (WIRE) ×1 IMPLANT
INQWIRE 1.5J .035X260CM (WIRE) ×2
KIT ENCORE 26 ADVANTAGE (KITS) IMPLANT
KIT HEART LEFT (KITS) ×2 IMPLANT
PACK CARDIAC CATHETERIZATION (CUSTOM PROCEDURE TRAY) ×2 IMPLANT
SHEATH GLIDE SLENDER 4/5FR (SHEATH) ×2 IMPLANT
SHEATH PINNACLE 7F 10CM (SHEATH) ×1 IMPLANT
TRANSDUCER W/STOPCOCK (MISCELLANEOUS) ×2 IMPLANT
TUBING CIL FLEX 10 FLL-RA (TUBING) ×2 IMPLANT
WIRE MICROINTRODUCER 60CM (WIRE) ×1 IMPLANT

## 2017-12-07 NOTE — Discharge Instructions (Signed)
NO METFORMIN/GLUCOPHAGE FOR 2 DAYS ° ° °Femoral Site Care °Refer to this sheet in the next few weeks. These instructions provide you with information about caring for yourself after your procedure. Your health care provider may also give you more specific instructions. Your treatment has been planned according to current medical practices, but problems sometimes occur. Call your health care provider if you have any problems or questions after your procedure. °What can I expect after the procedure? °After your procedure, it is typical to have the following: °· Bruising at the site that usually fades within 1-2 weeks. °· Blood collecting in the tissue (hematoma) that may be painful to the touch. It should usually decrease in size and tenderness within 1-2 weeks. ° °Follow these instructions at home: °· Take medicines only as directed by your health care provider. °· You may shower 24-48 hours after the procedure or as directed by your health care provider. Remove the bandage (dressing) and gently wash the site with plain soap and water. Pat the area dry with a clean towel. Do not rub the site, because this may cause bleeding. °· Do not take baths, swim, or use a hot tub until your health care provider approves. °· Check your insertion site every day for redness, swelling, or drainage. °· Do not apply powder or lotion to the site. °· Limit use of stairs to twice a day for the first 2-3 days or as directed by your health care provider. °· Do not squat for the first 2-3 days or as directed by your health care provider. °· Do not lift over 10 lb (4.5 kg) for 5 days after your procedure or as directed by your health care provider. °· Ask your health care provider when it is okay to: °? Return to work or school. °? Resume usual physical activities or sports. °? Resume sexual activity. °· Do not drive home if you are discharged the same day as the procedure. Have someone else drive you. °· You may drive 24 hours after the  procedure unless otherwise instructed by your health care provider. °· Do not operate machinery or power tools for 24 hours after the procedure or as directed by your health care provider. °· If your procedure was done as an outpatient procedure, which means that you went home the same day as your procedure, a responsible adult should be with you for the first 24 hours after you arrive home. °· Keep all follow-up visits as directed by your health care provider. This is important. °Contact a health care provider if: °· You have a fever. °· You have chills. °· You have increased bleeding from the site. Hold pressure on the site. °Get help right away if: °· You have unusual pain at the site. °· You have redness, warmth, or swelling at the site. °· You have drainage (other than a small amount of blood on the dressing) from the site. °· The site is bleeding, and the bleeding does not stop after 30 minutes of holding steady pressure on the site. °· Your leg or foot becomes pale, cool, tingly, or numb. °This information is not intended to replace advice given to you by your health care provider. Make sure you discuss any questions you have with your health care provider. °Document Released: 11/14/2013 Document Revised: 08/19/2015 Document Reviewed: 09/30/2013 °Elsevier Interactive Patient Education © 2018 [employer] ° ° ° °Radial Site Care °Refer to this sheet in the next few weeks. These instructions provide you with information   about caring for yourself after your procedure. Your health care provider may also give you more specific instructions. Your treatment has been planned according to current medical practices, but problems sometimes occur. Call your health care provider if you have any problems or questions after your procedure. °What can I expect after the procedure? °After your procedure, it is typical to have the following: °· Bruising at the radial site that usually fades within 1-2 weeks. °· Blood  collecting in the tissue (hematoma) that may be painful to the touch. It should usually decrease in size and tenderness within 1-2 weeks. ° °Follow these instructions at home: °· Take medicines only as directed by your health care provider. °· You may shower 24-48 hours after the procedure or as directed by your health care provider. Remove the bandage (dressing) and gently wash the site with plain soap and water. Pat the area dry with a clean towel. Do not rub the site, because this may cause bleeding. °· Do not take baths, swim, or use a hot tub until your health care provider approves. °· Check your insertion site every day for redness, swelling, or drainage. °· Do not apply powder or lotion to the site. °· Do not flex or bend the affected arm for 24 hours or as directed by your health care provider. °· Do not push or pull heavy objects with the affected arm for 24 hours or as directed by your health care provider. °· Do not lift over 10 lb (4.5 kg) for 5 days after your procedure or as directed by your health care provider. °· Ask your health care provider when it is okay to: °? Return to work or school. °? Resume usual physical activities or sports. °? Resume sexual activity. °· Do not drive home if you are discharged the same day as the procedure. Have someone else drive you. °· You may drive 24 hours after the procedure unless otherwise instructed by your health care provider. °· Do not operate machinery or power tools for 24 hours after the procedure. °· If your procedure was done as an outpatient procedure, which means that you went home the same day as your procedure, a responsible adult should be with you for the first 24 hours after you arrive home. °· Keep all follow-up visits as directed by your health care provider. This is important. °Contact a health care provider if: °· You have a fever. °· You have chills. °· You have increased bleeding from the radial site. Hold pressure on the site. °Get help  right away if: °· You have unusual pain at the radial site. °· You have redness, warmth, or swelling at the radial site. °· You have drainage (other than a small amount of blood on the dressing) from the radial site. °· The radial site is bleeding, and the bleeding does not stop after 30 minutes of holding steady pressure on the site. °· Your arm or hand becomes pale, cool, tingly, or numb. °This information is not intended to replace advice given to you by your health care provider. Make sure you discuss any questions you have with your health care provider. °Document Released: 04/15/2010 Document Revised: 08/19/2015 Document Reviewed: 09/29/2013 °Elsevier Interactive Patient Education © 2018 Elsevier Inc. ° °

## 2017-12-07 NOTE — Interval H&P Note (Signed)
History and Physical Interval Note:  12/07/2017 7:49 AM  Katherine Best  has presented today for surgery, with the diagnosis of sob - cad.   The various methods of treatment have been discussed with the patient and family. After consideration of risks, benefits and other options for treatment, the patient has consented to  Procedure(s): RIGHT/LEFT HEART CATH AND CORONARY ANGIOGRAPHY (N/A) with possible PERCUTANEOUS CORONARY INTERVENTION  as a surgical intervention .  The patient's history has been reviewed, patient examined, no change in status, stable for surgery.  I have reviewed the patient's chart and labs.  Questions were answered to the patient's satisfaction.    Cath Lab Visit (complete for each Cath Lab visit)  Clinical Evaluation Leading to the Procedure:   ACS: No.  Non-ACS:    Anginal Classification: CCS III - with dyspnea as Symptom.  Anti-ischemic medical therapy: Minimal Therapy (1 class of medications)  Non-Invasive Test Results: Low-risk stress test findings: cardiac mortality <1%/year  - but not done since 2017  Prior CABG: No previous CABG   Glenetta Hew

## 2017-12-07 NOTE — Progress Notes (Signed)
Up and walked and tolerated well; right groin stable no bleeding or hematoma 

## 2017-12-07 NOTE — Progress Notes (Signed)
Barbaraann Rondo called and informed of unable to get [facility] iv.

## 2017-12-07 NOTE — Progress Notes (Addendum)
Site area: RFV Site Prior to Removal:  Level 0 Pressure Applied For:20 min Manual:   yes Patient Status During Pull:  stable Post Pull Site:  Level 0 Post Pull Instructions Given: yes  Post Pull Pulses Present: palpable Dressing Applied:  clear Bedrest begins @ 0930 till 1130 Comments:

## 2017-12-09 ENCOUNTER — Encounter: Payer: Self-pay | Admitting: Family Medicine

## 2017-12-10 ENCOUNTER — Encounter (HOSPITAL_COMMUNITY): Payer: Medicare Other

## 2017-12-10 ENCOUNTER — Other Ambulatory Visit (HOSPITAL_COMMUNITY): Payer: Medicare Other

## 2017-12-10 ENCOUNTER — Other Ambulatory Visit: Payer: Self-pay | Admitting: Family Medicine

## 2017-12-10 ENCOUNTER — Telehealth: Payer: Self-pay | Admitting: Pulmonary Disease

## 2017-12-10 MED FILL — Heparin Sodium (Porcine) Inj 1000 Unit/ML: INTRAMUSCULAR | Qty: 10 | Status: AC

## 2017-12-10 NOTE — Telephone Encounter (Signed)
Called pt and gave appt  12/11/17 @ 2:30

## 2017-12-11 DIAGNOSIS — G4733 Obstructive sleep apnea (adult) (pediatric): Secondary | ICD-10-CM

## 2017-12-11 IMAGING — DX DG CHEST 2V
2 series · 2 of 2 positions shown · non-contrast
Comparison: None in PACs

CLINICAL DATA: Cough and chest congestion for the past month.
Abnormal chest exam. Former smoker.

EXAM:
CHEST  2 VIEW

[chest pa]
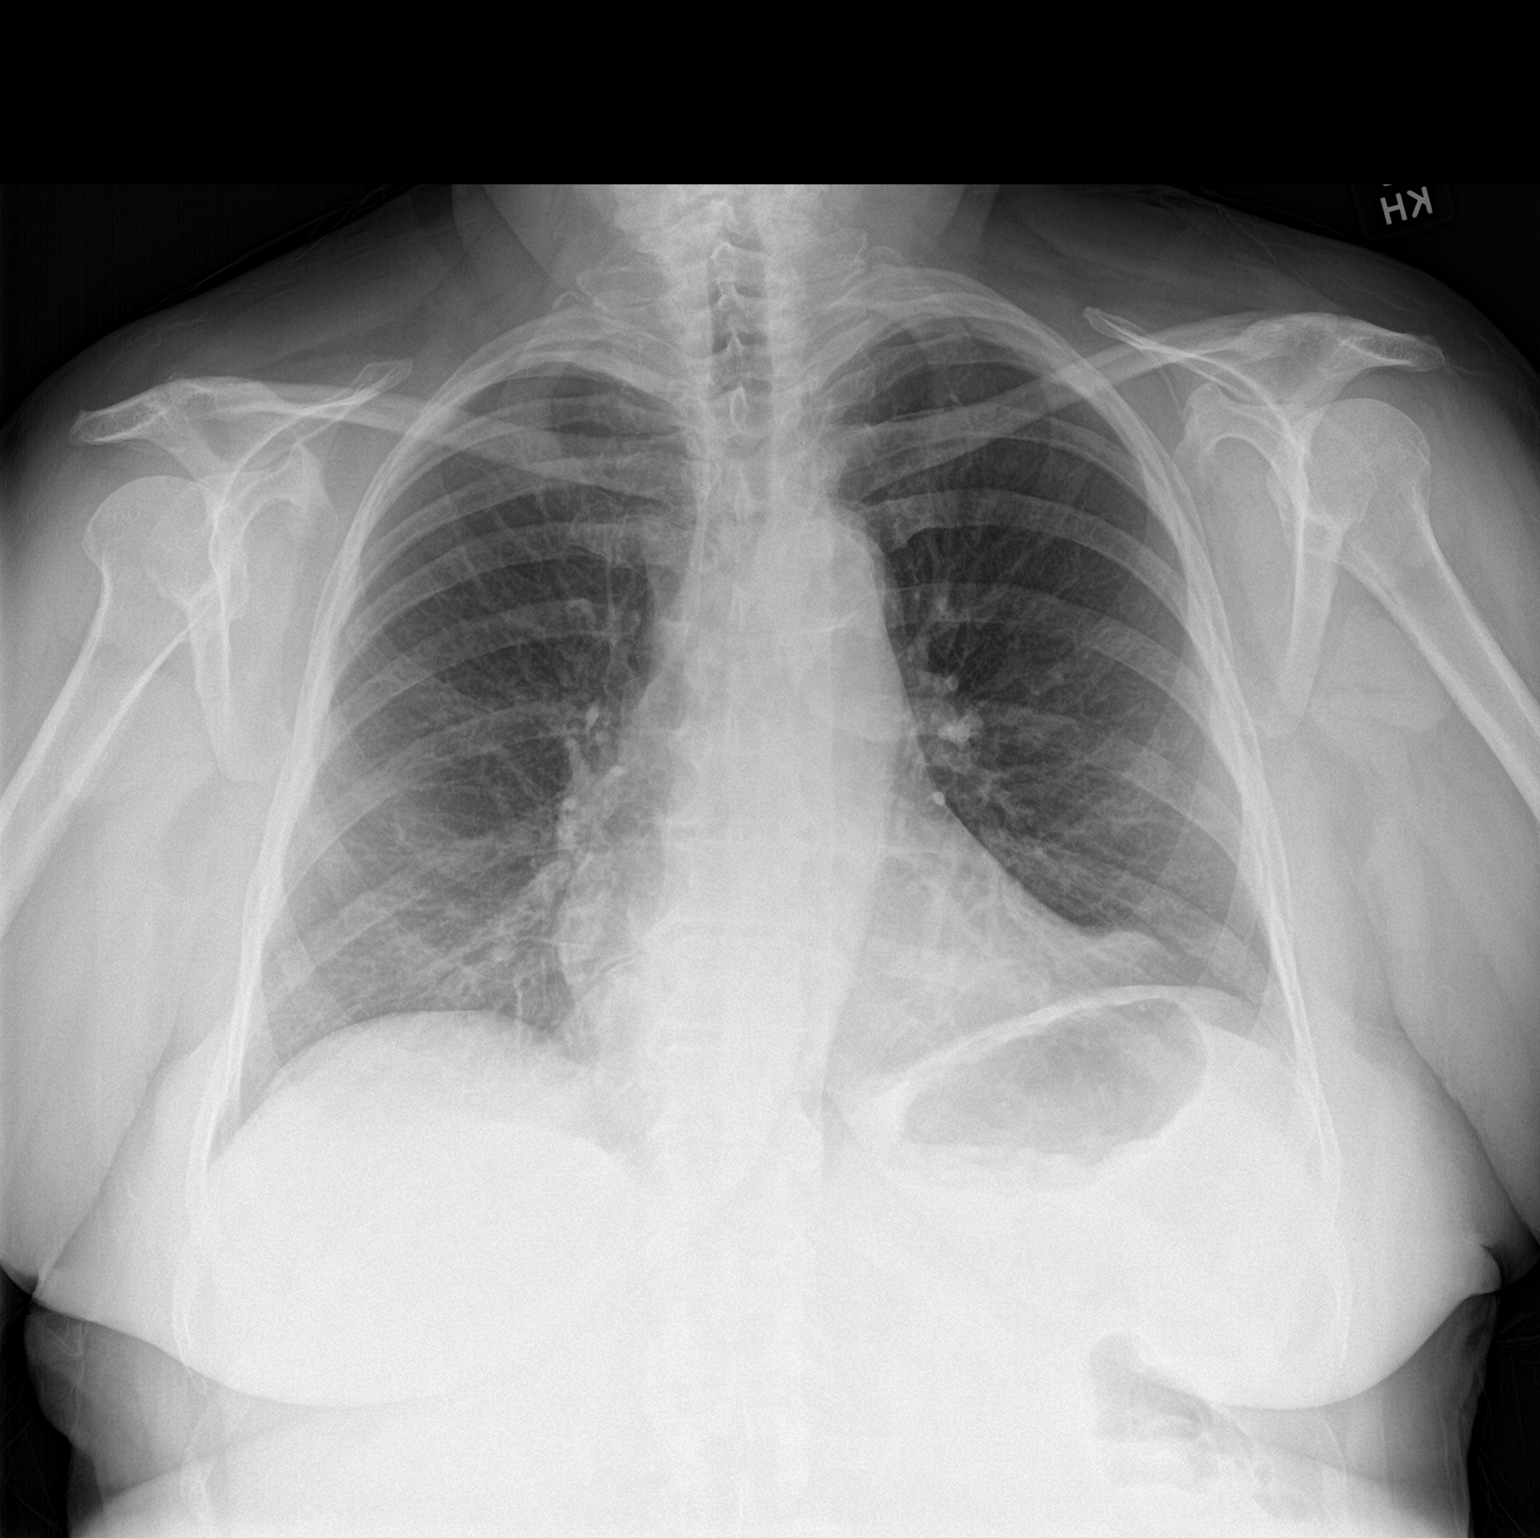

[chest lat]
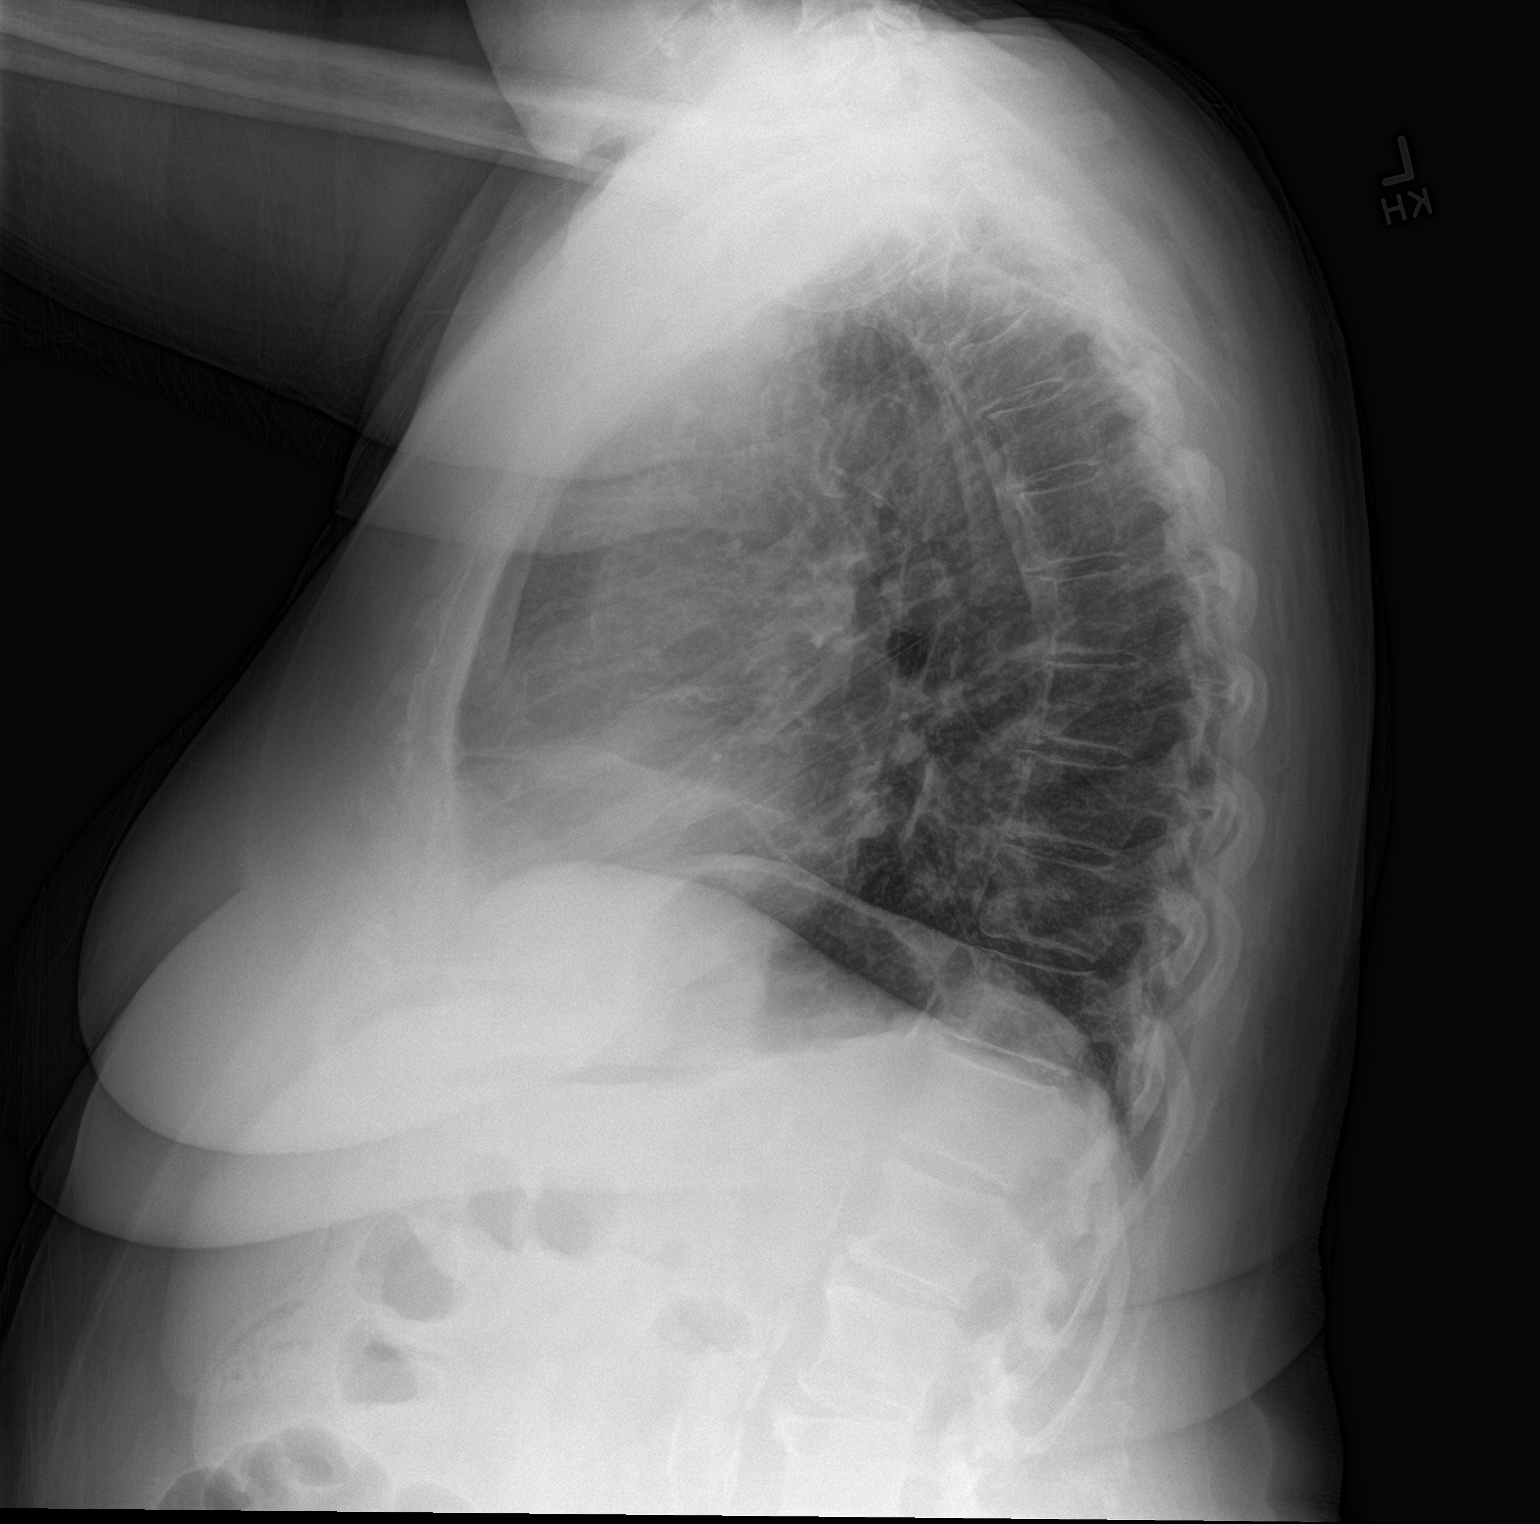

[2 of 2 positions shown; findings below may reference images not displayed]

FINDINGS: The lungs are adequately inflated. The lung markings are coarse in
the lingula and in the right lower lobe. The heart and pulmonary
vascularity are normal. The mediastinum is normal in width. There is
calcification in the wall of the aortic arch. The bony exhibits no
acute abnormality.
IMPRESSION: Bibasilar atelectasis or pneumonia. Followup PA and lateral chest
X-ray is recommended in 3-4 weeks following trial of antibiotic
therapy to ensure resolution and exclude underlying malignancy.

Thoracic aortic atherosclerosis.

## 2017-12-11 NOTE — Progress Notes (Signed)
Reviewed and agree with assessment/plan.   Elina Streng, MD [facility] 03/22/2016, 12:24 PM Pager:  [phone]  

## 2017-12-12 DIAGNOSIS — G4733 Obstructive sleep apnea (adult) (pediatric): Secondary | ICD-10-CM | POA: Diagnosis not present

## 2017-12-13 ENCOUNTER — Telehealth: Payer: Self-pay | Admitting: Pulmonary Disease

## 2017-12-13 DIAGNOSIS — G4733 Obstructive sleep apnea (adult) (pediatric): Secondary | ICD-10-CM

## 2017-12-13 NOTE — Telephone Encounter (Signed)
HST 12/11/17 >> AHI 13.3, SpO2 low 84%.   Please inform her that her sleep study shows mild obstructive sleep apnea.  Please arrange for auto CPAP range 5 to 15 cm H2O with heated humidity and mask of choice.  She needs ROV 2 months after starting CPAP.

## 2017-12-14 ENCOUNTER — Other Ambulatory Visit: Payer: Self-pay | Admitting: *Deleted

## 2017-12-14 DIAGNOSIS — G4733 Obstructive sleep apnea (adult) (pediatric): Secondary | ICD-10-CM

## 2017-12-14 NOTE — Telephone Encounter (Signed)
Advised pt of results. Pt understood and nothing further is needed.   CPAP ordered.  

## 2017-12-17 ENCOUNTER — Ambulatory Visit (INDEPENDENT_AMBULATORY_CARE_PROVIDER_SITE_OTHER): Payer: Medicare Other | Admitting: Internal Medicine

## 2017-12-17 ENCOUNTER — Encounter: Payer: Self-pay | Admitting: Internal Medicine

## 2017-12-17 VITALS — BP 110/56 | HR 71 | Ht 61.0 in | Wt 195.6 lb

## 2017-12-17 DIAGNOSIS — I2510 Atherosclerotic heart disease of native coronary artery without angina pectoris: Secondary | ICD-10-CM

## 2017-12-17 DIAGNOSIS — I519 Heart disease, unspecified: Secondary | ICD-10-CM | POA: Diagnosis not present

## 2017-12-17 DIAGNOSIS — I5033 Acute on chronic diastolic (congestive) heart failure: Secondary | ICD-10-CM | POA: Diagnosis not present

## 2017-12-17 DIAGNOSIS — R0602 Shortness of breath: Secondary | ICD-10-CM

## 2017-12-17 MED ORDER — FUROSEMIDE 20 MG PO TABS: 20.0000 mg | ORAL_TABLET | ORAL | 3 refills | Status: DC

## 2017-12-17 NOTE — Progress Notes (Addendum)
Cardiology Office Note   Date:  12/17/2017   ID:  Katherine Best, DOB 01/14/1934, MRN [id number]  PCP:  Tammi Sou, MD  Cardiologist:   Dorris Carnes, MD    Pt presents for f/u of CAD and SOB     History of Present Illness: Katherine Best is a 84 y.o. female with a history of mld CAD by cath  Normal R heart prssures  Mild diastolic dysfuncton  Also a history of DM   In 2018 Myovue ws normal  Echo with normal systolic func  Gr II diastolic dysfunction     I saw her earlier this month   She continued to be SOB   SHe has was recenetly seen by Dr [name]  Set up for another echo and myovue  She is due to see V Sood   Set for PFTs  She denied  CP  WIth continued SOB I cancelled stress test     Recomm R and L heart caht   This was done on 12/07/17   It showed    Hemodynamic findings consistent with mild pulmonary hypertension. LV end diastolic pressure is moderately elevated.  The left ventricular ejection fraction is greater than 65% by visual estimate.  Mild-moderate three-vessel CAD:  Prox RCA lesion is 20% stenosed. Mid RCA lesion is 20% stenosed.  Prox LAD to Mid LAD lesion is 35% stenosed.  Mid Cx lesion is 45% stenosed.   Right Heart Pressures Hemodynamic findings consistent with mild pulmonary hypertension. PA pressure 37/13 mmHg-mean 27 mmHg. PCWP 18-24 mmHg. Elevated LV EDP consistent with volume overload. LVP-EDP: 166/12 mmHg - 24 mmHg AoP-MAP: 169/77 mm.-112 mmHg. AO sat 95%. PA sat 75%. Cardiac output-index: Fick: 7.34-3.97. Thermodilution 5.06-2.74 (very variable recordings)  Right Atrium Right atrial pressure is elevated. RAP mean: 12 mmHg  Right Ventricle RVP-EDP: 42/6 mmHg - 13 mmHg      Plan: Venous sheath removal in the PACU holding area Dissipate discharge home today after bedrest. Recommend more aggressive blood pressure control and consider adding diuretic.  Recommend Aspirin 81mg  daily for moderate CAD.      Current Meds  Medication Sig    . albuterol (PROVENTIL HFA;VENTOLIN HFA) 108 (90 Base) MCG/ACT inhaler Inhale 2 puffs into the lungs every 6 (six) hours as needed for wheezing or shortness of breath.  . ALPRAZolam (XANAX) 0.25 MG tablet Take 0.25 mg by mouth at bedtime as needed for anxiety.  Marland Kitchen aspirin 81 MG chewable tablet Chew 1 tablet (81 mg total) by mouth 2 (two) times daily. for 4 weeks post-operative (Patient taking differently: Chew 81 mg by mouth 2 (two) times daily. )  . atorvastatin (LIPITOR) 20 MG tablet TAKE ONE TABLET BY MOUTH DAILY  . calcium carbonate (CALCIUM 600) 600 MG TABS tablet Take 600 mg by mouth daily.   . celecoxib (CELEBREX) 200 MG capsule Take 1 capsule (200 mg total) by mouth daily.  . diclofenac sodium (VOLTAREN) 1 % GEL Apply 2 g topically 4 (four) times daily.   Marland Kitchen dicyclomine (BENTYL) 10 MG capsule Take 1 tab 2-3 times daily for diarrhea/urgency. (Patient taking differently: Take 10 mg by mouth 3 (three) times daily before meals. )  . DULoxetine (CYMBALTA) 60 MG capsule TAKE ONE CAPSULE BY MOUTH DAILY (Patient taking differently: Take 60 mg by mouth daily. )  . estradiol (ESTRACE) 1 MG tablet Take 1 tablet (1 mg total) by mouth daily.  . ferrous sulfate 325 (65 FE) MG tablet Take 1 tablet (325  mg total) by mouth 2 (two) times daily with a meal. (Patient taking differently: Take 325 mg by mouth daily as needed (low iron). )  . furosemide (LASIX) 40 MG tablet Take 1 tablet as needed for swelling (Patient taking differently: Take 40 mg by mouth daily as needed for edema. )  . glucose blood test strip Use to check blood sugar once daily  . Lancets 30G MISC Use to check blood sugar once daily  . levothyroxine (SYNTHROID, LEVOTHROID) 25 MCG tablet TAKE ONE TABLET BY MOUTH DAILY (Patient taking differently: Take 25 mcg by mouth daily before breakfast. )  . loratadine (CLARITIN) 10 MG tablet Take 10 mg by mouth daily as needed for allergies.  Marland Kitchen losartan-hydrochlorothiazide (HYZAAR) 50-12.5 MG tablet TAKE  ONE TABLET BY MOUTH DAILY (Patient taking differently: Take 1 tablet by mouth daily. )  . LYRICA 50 MG capsule TAKE ONE CAPSULE BY MOUTH TWICE DAILY (Patient taking differently: Take 50 mg by mouth 2 (two) times daily. )  . metFORMIN (GLUCOPHAGE-XR) 500 MG 24 hr tablet TAKE TWO TABLETS BY MOUTH DAILY WITH BREAKFAST (Patient taking differently: Take 1,000 mg by mouth daily with breakfast. )  . metoprolol tartrate (LOPRESSOR) 50 MG tablet TAKE ONE TABLET BY MOUTH TWICE DAILY (Patient taking differently: Take 50 mg by mouth 2 (two) times daily. )  . mirtazapine (REMERON) 15 MG tablet Take 0.5 tablets (7.5 mg total) by mouth at bedtime.  . Multiple Vitamins-Minerals (MULTIVITAMIN ADULT PO) Take 1 tablet by mouth daily.   . Multiple Vitamins-Minerals (PRESERVISION AREDS 2 PO) Take 1 tablet by mouth daily.   Marland Kitchen omeprazole (PRILOSEC) 20 MG capsule TAKE ONE CAPSULE BY MOUTH DAILY (Patient taking differently: Take 20 mg by mouth daily. )  . OVER THE COUNTER MEDICATION Take 1 drop by mouth daily.   Marland Kitchen oxybutynin (DITROPAN-XL) 10 MG 24 hr tablet Take 10 mg by mouth daily as needed (urine frequency).   . pioglitazone (ACTOS) 15 MG tablet TAKE ONE TABLET BY MOUTH DAILY (Patient taking differently: Take 15 mg by mouth daily. )  . potassium chloride SA (K-DUR,KLOR-CON) 20 MEQ tablet Take 1 tablet (20 mEq total) by mouth daily.  Marland Kitchen tiZANidine (ZANAFLEX) 4 MG tablet Take 4 mg by mouth every 8 (eight) hours as needed for muscle spasms.      Allergies:   Gabapentin and Sulfonamide derivatives   Past Medical History:  Diagnosis Date  . Abdominal pain, right lower quadrant   . Cataracts, both eyes    soon to have cataract surg as of 03/2015  . Diabetes mellitus with complication (HCC)    DPN.  NO diab retinopathy as of 06/2017 eye exam.  . Diabetic peripheral neuropathy associated with type 2 diabetes mellitus ([place])   . Diverticulosis of colon    noted on colonoscopy 2008  . Dyspnea    a. 09/2011 CTA Chest: No PE,  Ca2+ cors;  b. 09/2011 R&L heart Cath: Relatively nl R heart pressures, nl co/ci, nonobs cad, nl EF.  12/2015 stress test normal, echo with grd II DD--likely explains DOE.  Worsening chronic DOE 2019-->cath w/no agiograph signif CAD.  Pulm HTN/DD.  EF 65%.  . External hemorrhoid   . Fatigue    w/ ? excessive daytime somnolence; ? OSA--eval by Dr. Halford Chessman 06/2016, home sleep study ordered.  . Former smoker    30 pack-yrs, quit 2005  . Heart murmur    hx of  . Hot flashes    restarted estradiol 01/2015  . Hx of  adenomatous polyp of colon 08/25/2016   07/2016 no recall  . Hyperlipemia, mixed    trigs remain elevated (200s-300s range), but LDL at goal.  . Hypertension   . Hypertensive heart disease 2019  . Hypothyroidism   . IBS (irritable bowel syndrome)    IBS-D (Dr. Carlean Purl) + ? worsened by GI side effect of metformin.  NO pancreatic insufficiency.  As per 09/2017 GI f/u, plan is to continue dicyclomine + f/u with them on prn basis  . Interstitial cystitis   . Liver function study, abnormal   . Lumbar degenerative disc disease    Facet-mediated pain per Dr. Nelva Bush.  Has gotten repeated LB injections, most recently April 2016 in [place].  Eval by Dr. Tonita Cong 03/2017.  Spinal stenosis without neurogenic claudication.  Arthropathy of lumbar facet jt: pt pursuing radiofrequency rhizotomy as of 04/23/17 ortho f/u  . Lumbar scoliosis    lumbosacral spondylosis w/out myelopathy (radiofrequency ablation/lesioning procedure planned for 05/2017).  . Macular degeneration    OS exudative (avastin treatments).  Advanced nonexudative OD, monitor.  AREDS  . Macular hole 07/05/2017   OD; surgically repaired 07/2017.  . Menopausal syndrome   . Morton's neuroma   . Obesity, Class II, BMI 35-39.9   . OSA on CPAP 07/2016   HST 07/27/16 >> AHI 6.2, SaO2 low 79%. Did not tolerate CPAP.  Pt to try again as of 11/2017.  . Osteoarthritis of both hips 2019   surgery NOT recommended ([facility])  . Other specified gastritis  without mention of hemorrhage    hx of antral gastritis on EGD 05/2009  . Peripheral neuropathy    ? DPN?--per EMG 11/2012.  Intolerant of gabapentin, failed pamelor.  Radiofrequency neurotomy 12/11/14 helped LB pain (Dr. Nelva Bush)  . Pneumonia   . Recurrent UTI    on daily antibiotic prophylaxis by her urologist.  . RLS (restless legs syndrome)   . Sleep apnea   . Tarsal tunnel syndrome   . Trochanteric bursitis of both hips 2019   Injections: [facility]  . Unspecified venous (peripheral) insufficiency     Past Surgical History:  Procedure Laterality Date  . ANKLE SURGERY     bilateral  . CARDIAC CATHETERIZATION  09/2011;11/2017   2013: Nonobstructive CAD. 2019: mild pulmonary hypertension. LV end diastolic pressure is moderately elevated.  EF >65%.  DD/hypertensive heart dz-->BP control and add ASA 81mg .  . CARDIOVASCULAR STRESS TEST  01/04/2016   Normal stress nuclear study with no ischemia or infarction; EF 82 with normal wall motion  . CATARACT EXTRACTION    . COLONOSCOPY  2002/2005, 11/14/06, and 05/2009   polyps 2002 but none 2005 or 2008 or 2011.  Repeat 08/17/16 for diarrhea: one diminutive adenoma + small polyps removed, diverticulosis noted--no further colonoscopies needed (Dr. Carlean Purl).  . CT angiogram chest  11/05/2017   NEG for PE.  Marland Kitchen DEXA  10/2016   NORMAL--consider repeat 2 yrs.  . ESOPHAGOGASTRODUODENOSCOPY  05/2009   Moderate antral gastritis  . FOOT SURGERY     R foot bunionectomy and arthroplasty of digits R foot.  Arthroplasty digits 3 and 4 L foot.  Marland Kitchen HAMMER TOE SURGERY     bilateral  . left tarsal tunnel release     sept '11 (Dr Beola Cord)  . PARTIAL KNEE ARTHROPLASTY Right 11/06/2016   Procedure: UNICOMPARTMENTAL RIGHT KNEE- Medially;  Surgeon: Paralee Cancel, MD;  Location: [facility];  Service: Orthopedics;  Laterality: Right;  90 mins  . PFTs  12/05/2017   no obstruction, mild restriction.  No reversibility. Moderate DLCO defect --continue prn albut  . radiofrequency  neurotomy  06/18/2015   bilat L3-4 medial branch nerve and bilat L5 dorsal ramus nerve.(Dr. Nelva Bush)  . RIGHT/LEFT HEART CATH AND CORONARY ANGIOGRAPHY N/A 12/07/2017   Procedure: RIGHT/LEFT HEART CATH AND CORONARY ANGIOGRAPHY;  Surgeon: Leonie Man, MD;  Location: [facility];  Service: Cardiovascular;  Laterality: N/A;  . TONSILLECTOMY    . TOTAL ABDOMINAL HYSTERECTOMY W/ BILATERAL SALPINGOOPHORECTOMY    . TRANSTHORACIC ECHOCARDIOGRAM  08/2011; 2017   2013 Grade I DD; 2017 EF 60-65%, normal wall motion, grd II DD.     Social History:  The patient  reports that she quit smoking about 15 years ago. Her smoking use included cigarettes. She has a 35.00 pack-year smoking history. She has never used smokeless tobacco. She reports that she does not drink alcohol or use drugs.   Family History:  The patient's family history includes Breast cancer in her mother; Cancer in her mother; Diabetes in her maternal grandfather; Hyperlipidemia in her father; Hypertension in her father; Mental illness in her brother.    ROS:  Please see the history of present illness. All other systems are reviewed and  Negative to the above problem except as noted.    PHYSICAL EXAM: VS:  BP (!) 110/56   Pulse 71   Ht 5\' 1"  (1.549 m)   Wt 195 lb 9.6 oz (88.7 kg)   SpO2 94%   BMI 36.96 kg/m   GEN: Obese 84 yo , in no acute distress  HEENT: normal  Neck: JVP is normal   No carotid bruits, or masses Cardiac: RRR; no murmurs, rubs, or gallops,Trt edema  Respiratory:  clear to auscultation bilaterally, normal work of breathing  No wheezes  No rales  GI: soft, nontender, nondistended, + BS  No hepatomegaly  MS: no deformi ty Moving all extremities   Skin: warm and dry,Some erythema to calvs   Neuro:  Strength and sensation are intact Psych: euthymic mood, full affect   EKG:  EKG is not  ordered today.   Lipid Panel    Component Value Date/Time   CHOL 154 04/17/2017 0803   TRIG 334 (H) 04/17/2017 0803    HDL 46 (L) 04/17/2017 0803   CHOLHDL 3.3 04/17/2017 0803   VLDL 56 (H) 12/27/2015 1123   LDLCALC 68 04/17/2017 0803   LDLDIRECT 76.0 01/18/2015 0826      Wt Readings from Last 3 Encounters:  12/17/17 195 lb 9.6 oz (88.7 kg)  12/07/17 190 lb (86.2 kg)  12/05/17 193 lb (87.5 kg)      ASSESSMENT AND PLAN:  1 Dyspnea   Cath on 9/13 noted above   Mild CAD    Signif elevation of R sided pressures I would recomm lasix 40 x 1 then 20 mg per day   Will need to follow electrolytes  The pt is also going to have a sleep study    If she has sleep apnea this may indeed be exacerbating the diastolic dyfuntion and Rx for this may help  2   CAD   Mild   Will need to Rx risks   (HTN, HL)    2  HL Continue statin  4    HTN   Follow      5   Morbid obesity   COunselled on diet and wt loss  BMET in 10 days  F/U with Me or APP in 6 wks   Current medicines are reviewed  at length with the patient today.  The patient does not have concerns regarding medicines.  Risks / benefits explained  Pt undrstands and agrees to proceed.   Signed, Dorris Carnes, MD  12/17/2017 2:15 PM    [facility] [address], [place], [place]  [address] Phone: [phone]; Fax: [phone]

## 2017-12-17 NOTE — Patient Instructions (Signed)
Your physician has recommended you make the following change in your medication:  1.) TODAY-take furosemide 40 mg, then every other day take 20 mg once a day  Your physician recommends that you return for lab work today and in 10 days (BMET)  Your physician recommends that you schedule a follow-up appointment in: about 6 weeks with Dr. Harrington Challenger or APP on Dr. Harrington Challenger care team.

## 2017-12-18 ENCOUNTER — Encounter: Payer: Self-pay | Admitting: Family Medicine

## 2017-12-18 LAB — BASIC METABOLIC PANEL
BUN / CREAT RATIO: 21 (ref 12–28)
BUN: 18 mg/dL (ref 8–27)
CO2: 21 mmol/L (ref 20–29)
Calcium: 9.3 mg/dL (ref 8.7–10.3)
Chloride: 103 mmol/L (ref 96–106)
Creatinine, Ser: 0.84 mg/dL (ref 0.57–1.00)
GFR calc non Af Amer: 65 mL/min/{1.73_m2} (ref >59)
GFR, EST AFRICAN AMERICAN: 75 mL/min/{1.73_m2} (ref >59)
Glucose: 132 mg/dL — ABNORMAL HIGH (ref 65–99)
Potassium: 4.7 mmol/L (ref 3.5–5.2)
Sodium: 143 mmol/L (ref 134–144)

## 2017-12-18 LAB — PRO B NATRIURETIC PEPTIDE: NT-PRO BNP: 199 pg/mL (ref 0–738)

## 2017-12-21 ENCOUNTER — Ambulatory Visit: Payer: Medicare Other | Admitting: Internal Medicine

## 2017-12-21 ENCOUNTER — Other Ambulatory Visit: Payer: Self-pay | Admitting: Pulmonary Disease

## 2017-12-21 DIAGNOSIS — G4733 Obstructive sleep apnea (adult) (pediatric): Secondary | ICD-10-CM

## 2017-12-21 NOTE — Progress Notes (Signed)
Medical records/scanning requesting that order be placed again so that results can be attached.  This has been done.  Parke Poisson, [facility] 12/21/17

## 2017-12-24 ENCOUNTER — Other Ambulatory Visit: Payer: Self-pay | Admitting: Family Medicine

## 2017-12-24 DIAGNOSIS — I10 Essential (primary) hypertension: Secondary | ICD-10-CM

## 2017-12-27 ENCOUNTER — Other Ambulatory Visit: Payer: Medicare Other | Admitting: *Deleted

## 2017-12-27 DIAGNOSIS — R0602 Shortness of breath: Secondary | ICD-10-CM

## 2017-12-27 DIAGNOSIS — I519 Heart disease, unspecified: Secondary | ICD-10-CM | POA: Diagnosis not present

## 2017-12-27 LAB — BASIC METABOLIC PANEL
BUN/Creatinine Ratio: 23 (ref 12–28)
BUN: 18 mg/dL (ref 8–27)
CO2: 24 mmol/L (ref 20–29)
CREATININE: 0.79 mg/dL (ref 0.57–1.00)
Calcium: 9.5 mg/dL (ref 8.7–10.3)
Chloride: 99 mmol/L (ref 96–106)
GFR calc Af Amer: 81 mL/min/{1.73_m2} (ref >59)
GFR, EST NON AFRICAN AMERICAN: 70 mL/min/{1.73_m2} (ref >59)
GLUCOSE: 110 mg/dL — AB (ref 65–99)
Potassium: 4.4 mmol/L (ref 3.5–5.2)
SODIUM: 140 mmol/L (ref 134–144)

## 2017-12-27 IMAGING — CR DG CHEST 2V
2 series · 2 of 2 positions shown · non-contrast
Comparison: 07/10/2016

CLINICAL DATA: Chest pain.

EXAM:
CHEST  2 VIEW

[w chest pa]
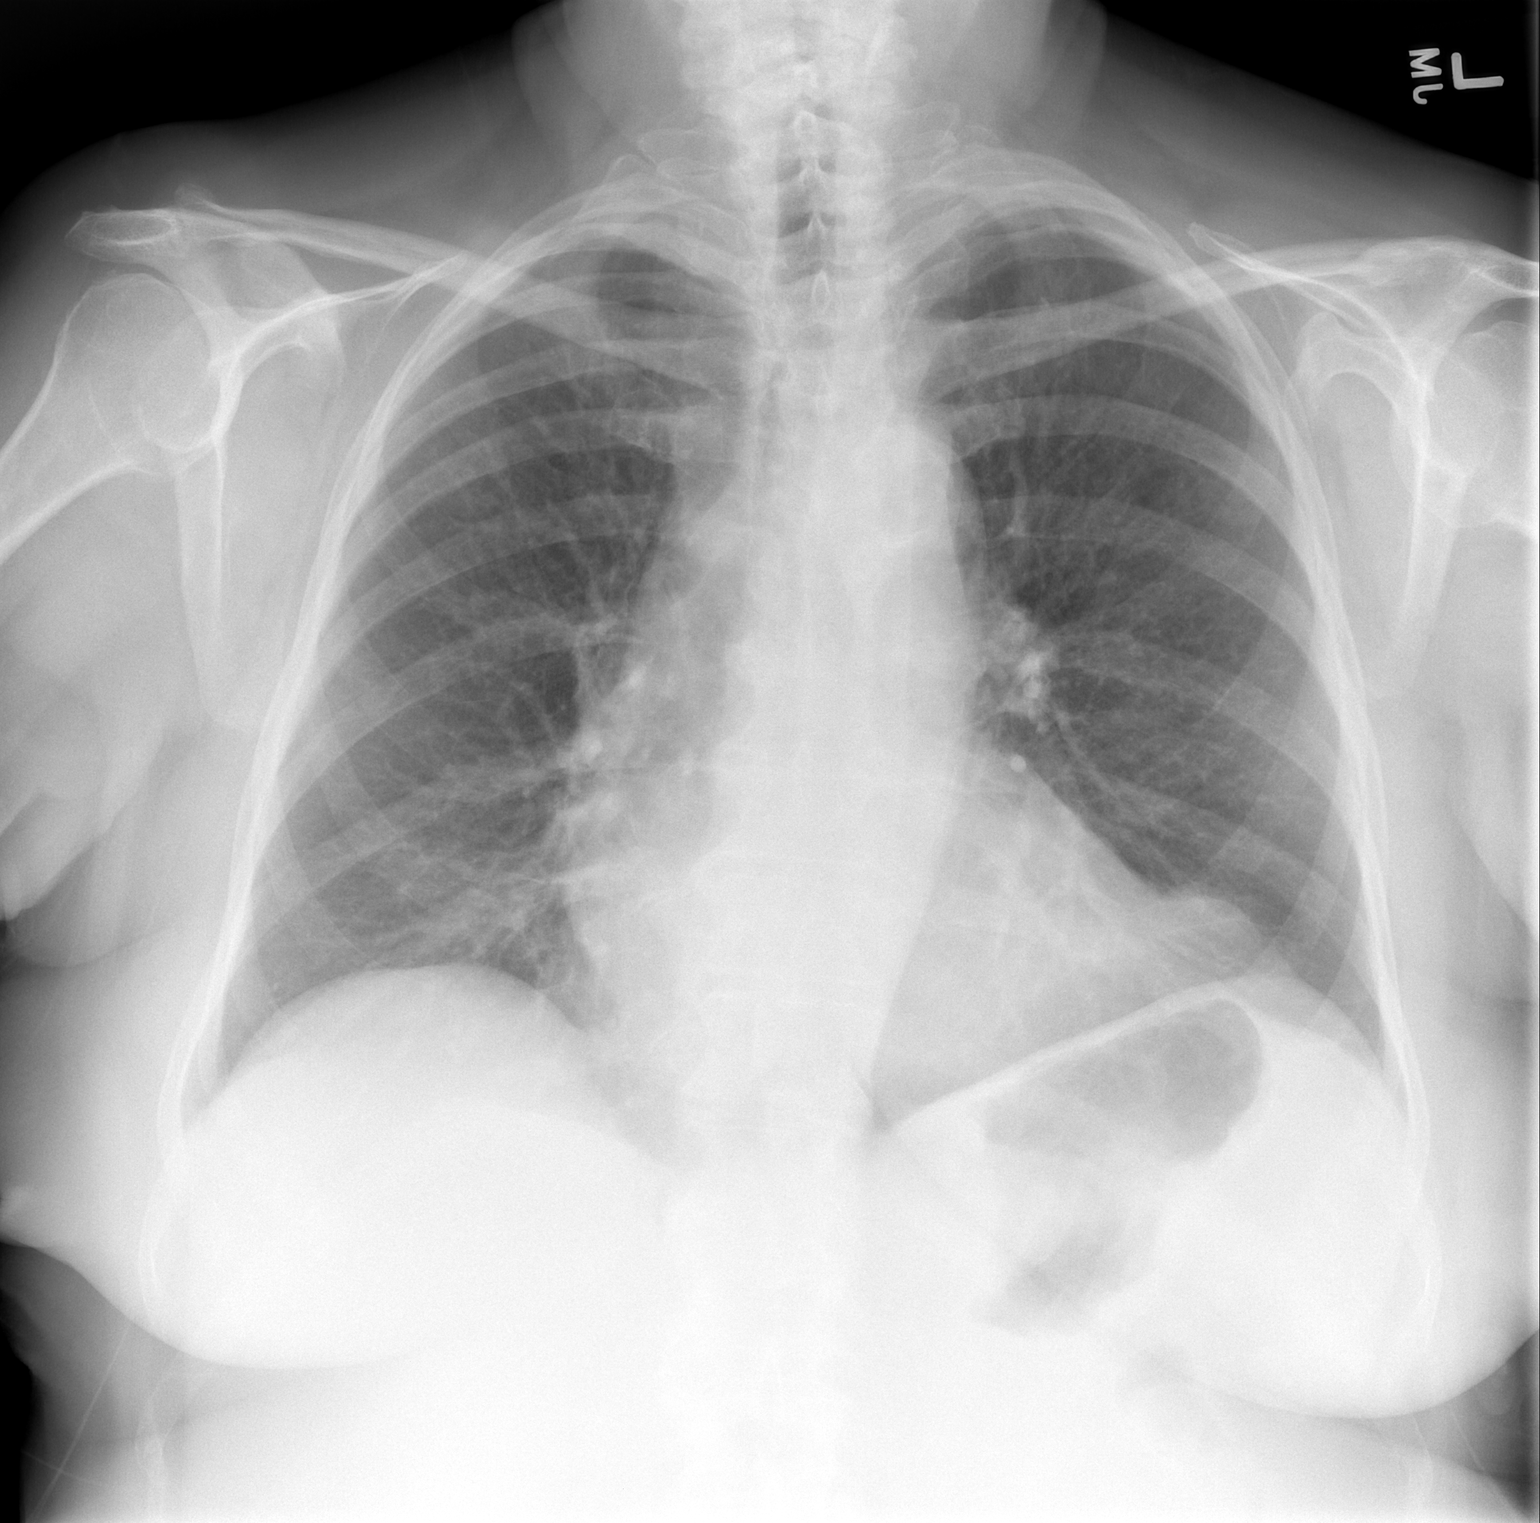

[w chest lat]
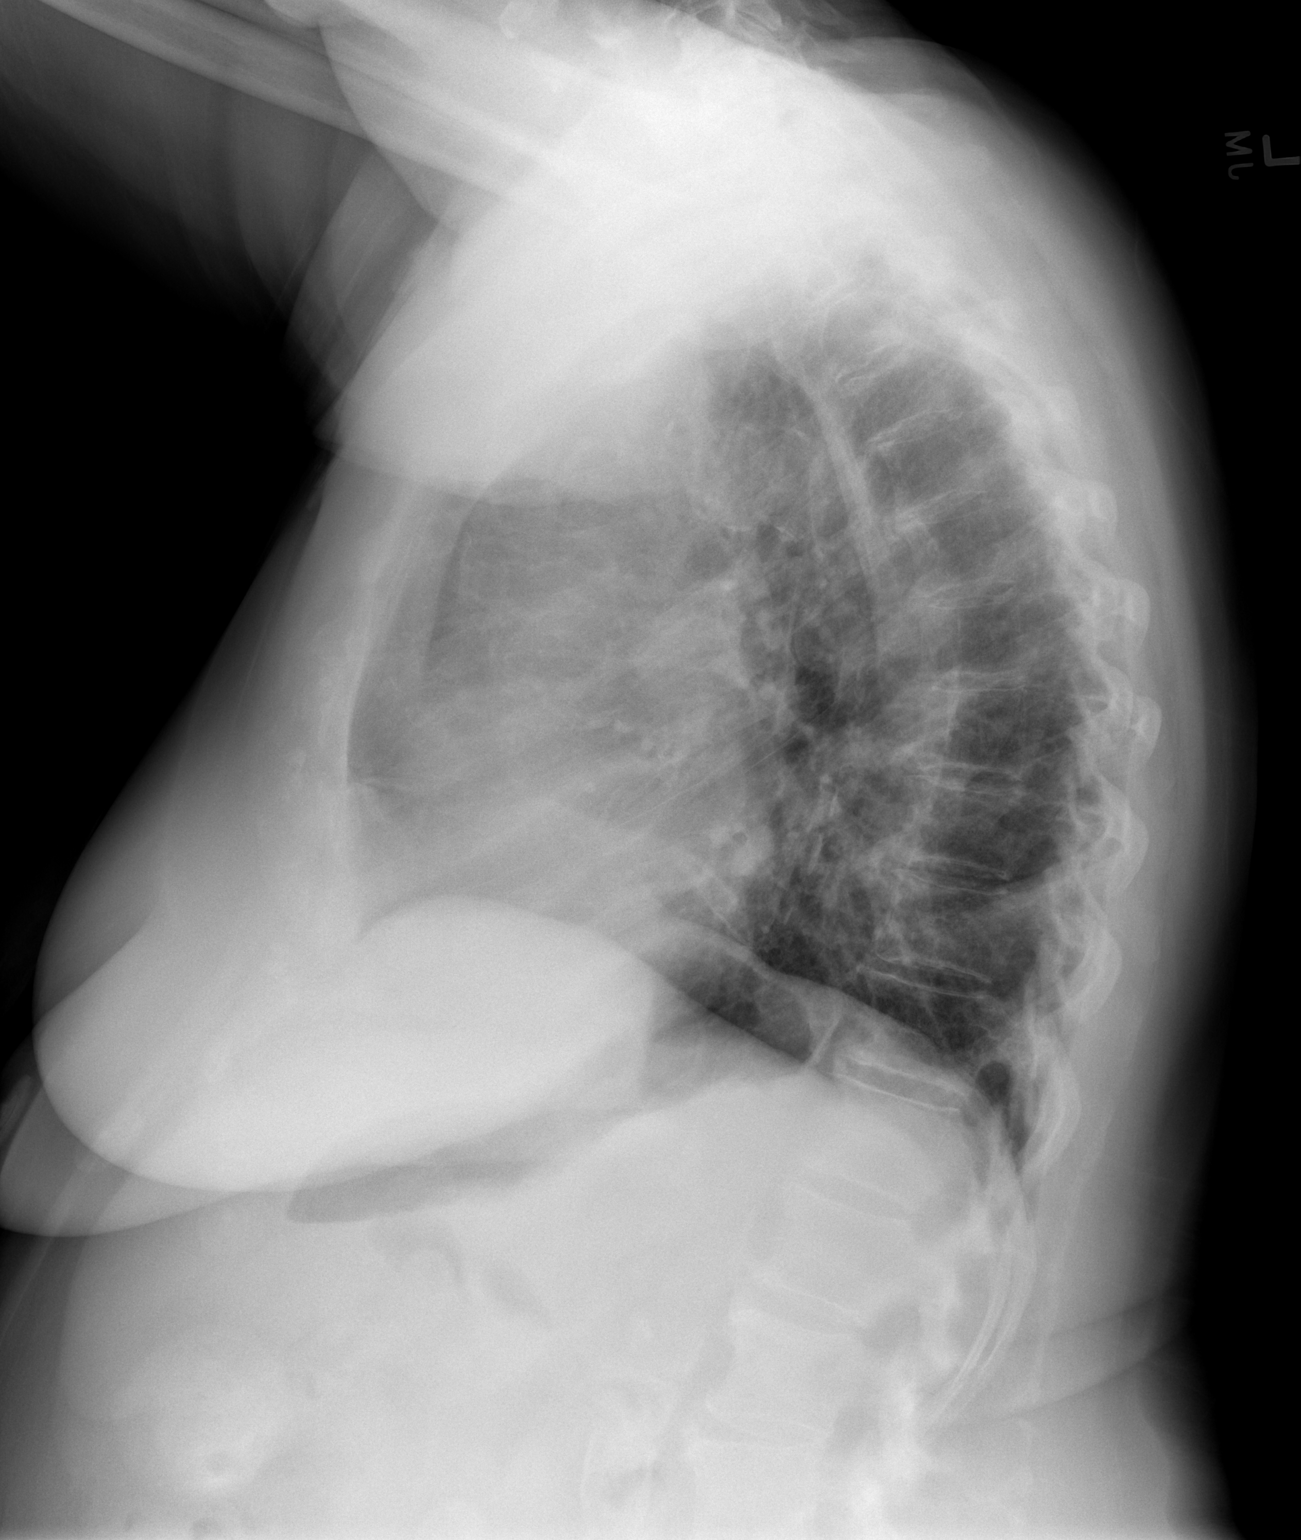

[2 of 2 positions shown; findings below may reference images not displayed]

FINDINGS: Chronic cardiomegaly. Mild scarring or atelectasis along the left
heart border. There is no edema, consolidation, effusion, or
pneumothorax. Stable mediastinal contours with aortic tortuosity.
Atherosclerosis. Rounded density projecting inferior to the liver in
the lateral projection may be gastric antrum or transverse colon.
IMPRESSION: No evidence of active disease.

## 2017-12-31 DIAGNOSIS — M7062 Trochanteric bursitis, left hip: Secondary | ICD-10-CM | POA: Diagnosis not present

## 2017-12-31 DIAGNOSIS — M7061 Trochanteric bursitis, right hip: Secondary | ICD-10-CM | POA: Diagnosis not present

## 2017-12-31 DIAGNOSIS — M25559 Pain in unspecified hip: Secondary | ICD-10-CM | POA: Diagnosis not present

## 2017-12-31 DIAGNOSIS — M76892 Other specified enthesopathies of left lower limb, excluding foot: Secondary | ICD-10-CM | POA: Diagnosis not present

## 2018-01-10 ENCOUNTER — Ambulatory Visit (INDEPENDENT_AMBULATORY_CARE_PROVIDER_SITE_OTHER): Payer: Medicare Other | Admitting: Family Medicine

## 2018-01-10 ENCOUNTER — Encounter: Payer: Self-pay | Admitting: Family Medicine

## 2018-01-10 VITALS — BP 120/72 | HR 58 | Temp 97.5°F | Resp 16 | Ht 61.0 in | Wt 195.1 lb

## 2018-01-10 DIAGNOSIS — I5189 Other ill-defined heart diseases: Secondary | ICD-10-CM

## 2018-01-10 DIAGNOSIS — R0609 Other forms of dyspnea: Secondary | ICD-10-CM | POA: Diagnosis not present

## 2018-01-10 DIAGNOSIS — I2510 Atherosclerotic heart disease of native coronary artery without angina pectoris: Secondary | ICD-10-CM

## 2018-01-10 DIAGNOSIS — I10 Essential (primary) hypertension: Secondary | ICD-10-CM

## 2018-01-10 DIAGNOSIS — E669 Obesity, unspecified: Secondary | ICD-10-CM | POA: Diagnosis not present

## 2018-01-10 DIAGNOSIS — I272 Pulmonary hypertension, unspecified: Secondary | ICD-10-CM

## 2018-01-10 DIAGNOSIS — G4733 Obstructive sleep apnea (adult) (pediatric): Secondary | ICD-10-CM | POA: Diagnosis not present

## 2018-01-10 DIAGNOSIS — E118 Type 2 diabetes mellitus with unspecified complications: Secondary | ICD-10-CM | POA: Diagnosis not present

## 2018-01-10 LAB — POCT GLYCOSYLATED HEMOGLOBIN (HGB A1C): HEMOGLOBIN A1C: 6 % — AB (ref 4.0–5.6)

## 2018-01-10 MED ORDER — FUROSEMIDE 20 MG PO TABS: ORAL_TABLET | ORAL | 3 refills | Status: DC

## 2018-01-10 NOTE — Progress Notes (Signed)
OFFICE VISIT  01/10/2018   CC:  Chief Complaint  Patient presents with  . Follow-up    RCI, pt is not fasting.     HPI:    Patient is a 84 y.o. Caucasian female who presents for 3 mo f/u DM 2, HTN, recent worsening of DOE.  Labs normal last f/u visit: A1c was 6.8%.  No med changes were made.  DM: glucoses 150 usually--usually she does not check it fasting.  Checks it about every 2 d.  HTN: at home it is consistently 120s/60s.  DOE: Last month she got a cath and this revealed mild CAD only, EF 60-65%.  It did show DD and mild pulm HTN so cardiologist started her on lasix.  After initiation of lasix she had stable renal function and electrolytes. Was started on lasix 20mg  qod but now she is taking it only as needed---legs swollen and/or feeling of lower abd bloating. She had sleep study and goes in for CPAP titration in 4 days.  Overall she feels significantly improved.  Past Medical History:  Diagnosis Date  . Abdominal pain, right lower quadrant   . Cataracts, both eyes    soon to have cataract surg as of 03/2015  . Diabetes mellitus with complication (HCC)    DPN.  NO diab retinopathy as of 06/2017 eye exam.  . Diabetic peripheral neuropathy associated with type 2 diabetes mellitus ([place])   . Diverticulosis of colon    noted on colonoscopy 2008  . Dyspnea    a. 09/2011 CTA Chest: No PE, Ca2+ cors;  b. 09/2011 R&L heart Cath: Relatively nl R heart pressures, nl co/ci, nonobs cad, nl EF.  12/2015 stress test normal, echo with grd II DD--likely explains DOE.  Worsening chronic DOE 2019-->cath w/no agiograph signif CAD.  Pulm HTN/DD.  EF 65%.  Cards started lasix.  Marland Kitchen External hemorrhoid   . Fatigue    w/ ? excessive daytime somnolence; ? OSA--eval by Dr. Halford Chessman 06/2016, home sleep study ordered.  . Former smoker    30 pack-yrs, quit 2005  . Heart murmur    hx of  . Hot flashes    restarted estradiol 01/2015  . Hx of adenomatous polyp of colon 08/25/2016   07/2016 no recall  .  Hyperlipemia, mixed    trigs remain elevated (200s-300s range), but LDL at goal.  . Hypertension   . Hypertensive heart disease 2019  . Hypothyroidism   . IBS (irritable bowel syndrome)    IBS-D (Dr. Carlean Purl) + ? worsened by GI side effect of metformin.  NO pancreatic insufficiency.  As per 09/2017 GI f/u, plan is to continue dicyclomine + f/u with them on prn basis  . Interstitial cystitis   . Liver function study, abnormal   . Lumbar degenerative disc disease    Facet-mediated pain per Dr. Nelva Bush.  Has gotten repeated LB injections, most recently April 2016 in [place].  Eval by Dr. Tonita Cong 03/2017.  Spinal stenosis without neurogenic claudication.  Arthropathy of lumbar facet jt: pt pursuing radiofrequency rhizotomy as of 04/23/17 ortho f/u  . Lumbar scoliosis    lumbosacral spondylosis w/out myelopathy (radiofrequency ablation/lesioning procedure planned for 05/2017).  . Macular degeneration    OS exudative (avastin treatments).  Advanced nonexudative OD, monitor.  AREDS  . Macular hole 07/05/2017   OD; surgically repaired 07/2017.  . Menopausal syndrome   . Morton's neuroma   . Obesity, Class II, BMI 35-39.9   . OSA on CPAP 07/2016   HST 07/27/16 >>  AHI 6.2, SaO2 low 79%. Did not tolerate CPAP.  Pt to try again as of 12/2017.  . Osteoarthritis of both hips 2019   surgery NOT recommended ([facility])  . Other specified gastritis without mention of hemorrhage    hx of antral gastritis on EGD 05/2009  . Peripheral neuropathy    ? DPN?--per EMG 11/2012.  Intolerant of gabapentin, failed pamelor.  Radiofrequency neurotomy 12/11/14 helped LB pain (Dr. Nelva Bush)  . Pneumonia   . Recurrent UTI    on daily antibiotic prophylaxis by her urologist.  . RLS (restless legs syndrome)   . Sleep apnea   . Tarsal tunnel syndrome   . Trochanteric bursitis of both hips 2019   Injections: [facility]  . Unspecified venous (peripheral) insufficiency     Past Surgical History:  Procedure Laterality Date  .  ANKLE SURGERY     bilateral  . CARDIAC CATHETERIZATION  09/2011;11/2017   2013: Nonobstructive CAD. 2019: mild pulmonary hypertension. LV end diastolic pressure is moderately elevated.  EF >65%.  DD/hypertensive heart dz-->BP control and add ASA 81mg .  . CARDIOVASCULAR STRESS TEST  01/04/2016   Normal stress nuclear study with no ischemia or infarction; EF 82 with normal wall motion  . CATARACT EXTRACTION    . COLONOSCOPY  2002/2005, 11/14/06, and 05/2009   polyps 2002 but none 2005 or 2008 or 2011.  Repeat 08/17/16 for diarrhea: one diminutive adenoma + small polyps removed, diverticulosis noted--no further colonoscopies needed (Dr. Carlean Purl).  . CT angiogram chest  11/05/2017   NEG for PE.  Marland Kitchen DEXA  10/2016   NORMAL--consider repeat 2 yrs.  . ESOPHAGOGASTRODUODENOSCOPY  05/2009   Moderate antral gastritis  . FOOT SURGERY     R foot bunionectomy and arthroplasty of digits R foot.  Arthroplasty digits 3 and 4 L foot.  Marland Kitchen HAMMER TOE SURGERY     bilateral  . left tarsal tunnel release     sept '11 (Dr Beola Cord)  . PARTIAL KNEE ARTHROPLASTY Right 11/06/2016   Procedure: UNICOMPARTMENTAL RIGHT KNEE- Medially;  Surgeon: Paralee Cancel, MD;  Location: [facility];  Service: Orthopedics;  Laterality: Right;  90 mins  . PFTs  12/05/2017   no obstruction, mild restriction. No reversibility. Moderate DLCO defect --continue prn albut  . radiofrequency neurotomy  06/18/2015   bilat L3-4 medial branch nerve and bilat L5 dorsal ramus nerve.(Dr. Nelva Bush)  . RIGHT/LEFT HEART CATH AND CORONARY ANGIOGRAPHY N/A 12/07/2017   Procedure: RIGHT/LEFT HEART CATH AND CORONARY ANGIOGRAPHY;  Surgeon: Leonie Man, MD;  Location: [facility];  Service: Cardiovascular;  Laterality: N/A;  . TONSILLECTOMY    . TOTAL ABDOMINAL HYSTERECTOMY W/ BILATERAL SALPINGOOPHORECTOMY    . TRANSTHORACIC ECHOCARDIOGRAM  08/2011; 2017   2013 Grade I DD; 2017 EF 60-65%, normal wall motion, grd II DD.    Outpatient Medications Prior to Visit   Medication Sig Dispense Refill  . albuterol (PROVENTIL HFA;VENTOLIN HFA) 108 (90 Base) MCG/ACT inhaler Inhale 2 puffs into the lungs every 6 (six) hours as needed for wheezing or shortness of breath. 1 Inhaler 1  . ALPRAZolam (XANAX) 0.25 MG tablet Take 0.25 mg by mouth at bedtime as needed for anxiety.    Marland Kitchen aspirin 81 MG chewable tablet Chew 1 tablet (81 mg total) by mouth 2 (two) times daily. for 4 weeks post-operative (Patient taking differently: Chew 81 mg by mouth 2 (two) times daily. ) 60 tablet 0  . atorvastatin (LIPITOR) 20 MG tablet TAKE ONE TABLET BY MOUTH DAILY 90 tablet 1  .  calcium carbonate (CALCIUM 600) 600 MG TABS tablet Take 600 mg by mouth daily.     . celecoxib (CELEBREX) 200 MG capsule Take 1 capsule (200 mg total) by mouth daily. 90 capsule 1  . diclofenac sodium (VOLTAREN) 1 % GEL Apply 2 g topically 4 (four) times daily.   1  . dicyclomine (BENTYL) 10 MG capsule Take 1 tab 2-3 times daily for diarrhea/urgency. (Patient taking differently: Take 10 mg by mouth 3 (three) times daily before meals. ) 90 capsule 6  . DULoxetine (CYMBALTA) 60 MG capsule TAKE ONE CAPSULE BY MOUTH DAILY (Patient taking differently: Take 60 mg by mouth daily. ) 90 capsule 1  . ferrous sulfate 325 (65 FE) MG tablet Take 1 tablet (325 mg total) by mouth 2 (two) times daily with a meal. (Patient taking differently: Take 325 mg by mouth daily as needed (low iron). ) 30 tablet 0  . furosemide (LASIX) 20 MG tablet Take 1 tablet (20 mg total) by mouth every other day. 45 tablet 3  . glucose blood test strip Use to check blood sugar once daily 100 each 11  . Lancets 30G MISC Use to check blood sugar once daily 100 each 11  . levothyroxine (SYNTHROID, LEVOTHROID) 25 MCG tablet TAKE ONE TABLET BY MOUTH DAILY (Patient taking differently: Take 25 mcg by mouth daily before breakfast. ) 90 tablet 1  . loratadine (CLARITIN) 10 MG tablet Take 10 mg by mouth daily as needed for allergies.    Marland Kitchen  losartan-hydrochlorothiazide (HYZAAR) 50-12.5 MG tablet TAKE ONE TABLET BY MOUTH DAILY (Patient taking differently: Take 1 tablet by mouth daily. ) 90 tablet 1  . LYRICA 50 MG capsule TAKE ONE CAPSULE BY MOUTH TWICE DAILY (Patient taking differently: Take 50 mg by mouth 2 (two) times daily. ) 180 capsule 1  . metFORMIN (GLUCOPHAGE-XR) 500 MG 24 hr tablet TAKE TWO TABLETS BY MOUTH DAILY WITH BREAKFAST (Patient taking differently: Take 1,000 mg by mouth daily with breakfast. ) 180 tablet 1  . metoprolol tartrate (LOPRESSOR) 50 MG tablet TAKE ONE TABLET BY MOUTH TWICE DAILY (Patient taking differently: Take 50 mg by mouth 2 (two) times daily. ) 180 tablet 1  . mirtazapine (REMERON) 15 MG tablet Take 0.5 tablets (7.5 mg total) by mouth at bedtime. 90 tablet 1  . Multiple Vitamins-Minerals (MULTIVITAMIN ADULT PO) Take 1 tablet by mouth daily.     . Multiple Vitamins-Minerals (PRESERVISION AREDS 2 PO) Take 1 tablet by mouth daily.     Marland Kitchen omeprazole (PRILOSEC) 20 MG capsule TAKE ONE CAPSULE BY MOUTH DAILY (Patient taking differently: Take 20 mg by mouth daily. ) 90 capsule 1  . OVER THE COUNTER MEDICATION Take 1 drop by mouth daily.     Marland Kitchen oxybutynin (DITROPAN-XL) 10 MG 24 hr tablet Take 10 mg by mouth daily as needed (urine frequency).     . pioglitazone (ACTOS) 15 MG tablet TAKE ONE TABLET BY MOUTH DAILY (Patient taking differently: Take 15 mg by mouth daily. ) 90 tablet 1  . potassium chloride SA (K-DUR,KLOR-CON) 20 MEQ tablet Take 1 tablet (20 mEq total) by mouth daily. 90 tablet 3  . tiZANidine (ZANAFLEX) 4 MG tablet Take 4 mg by mouth every 8 (eight) hours as needed for muscle spasms.   1  . estradiol (ESTRACE) 1 MG tablet Take 1 tablet (1 mg total) by mouth daily. (Patient not taking: Reported on 01/10/2018) 90 tablet 3   No facility-administered medications prior to visit.     Allergies  Allergen Reactions  . Gabapentin Itching  . Sulfonamide Derivatives Nausea Only    REACTION: Nausea     ROS As per HPI  PE: Blood pressure 120/72, pulse (!) 58, temperature (!) 97.5 F (36.4 C), temperature source Oral, resp. rate 16, height 5\' 1"  (1.549 m), weight 195 lb 2 oz (88.5 kg), SpO2 97 %. Gen: Alert, well appearing.  Patient is oriented to person, place, time, and situation. AFFECT: pleasant, lucid thought and speech. CV: Regular, slight bradycardia (55 or so), no m/r/g.   LUNGS: CTA bilat, nonlabored resps, good aeration in all lung fields. EXT: no clubbing or cyanosis.  Trace bilat LL pitting edema AT MOST.    LABS:  Lab Results  Component Value Date   TSH 3.06 11/01/2017   Lab Results  Component Value Date   WBC 7.7 12/04/2017   HGB 12.3 12/04/2017   HCT 36.5 12/04/2017   MCV 93 12/04/2017   PLT 324 12/04/2017   Lab Results  Component Value Date   CREATININE 0.79 12/27/2017   BUN 18 12/27/2017   NA 140 12/27/2017   K 4.4 12/27/2017   CL 99 12/27/2017   CO2 24 12/27/2017   Lab Results  Component Value Date   ALT 21 11/01/2017   AST 16 11/01/2017   ALKPHOS 99 11/01/2017   BILITOT 0.4 11/01/2017   Lab Results  Component Value Date   CHOL 154 04/17/2017   Lab Results  Component Value Date   HDL 46 (L) 04/17/2017   Lab Results  Component Value Date   LDLCALC 68 04/17/2017   Lab Results  Component Value Date   TRIG 334 (H) 04/17/2017   Lab Results  Component Value Date   CHOLHDL 3.3 04/17/2017   Lab Results  Component Value Date   HGBA1C 6.8 (H) 10/10/2017    IMPRESSION AND PLAN:  1) DOE, chronic.  Was worsening so she underwent some testing recently. DD/mild pulm HTN detected on recent cath, mild CAD, EF 60-65%. Pt was started on low dose lasix by her cardiologist, using this prn at this time and pt is pleased with her improvement. Most recent lytes/cr stable (12/27/17). She has OSA and is going to get CPAP titration soon.  2) DM 2: historically well controlled. Pioglit 15mg  qd, metform xr 1000 mg qAM. HbA1c today was 6%.  3)  HTN: The current medical regimen is effective;  continue present plan and medications. Lytes/cr good 2 weeks ago.  No repeat indicated today.  An After Visit Summary was printed and given to the patient.  FOLLOW UP: Return in about 3 months (around 04/12/2018) for routine chronic illness f/u.  Signed:  Crissie Sickles, MD           01/10/2018

## 2018-01-14 ENCOUNTER — Encounter: Payer: Self-pay | Admitting: Family Medicine

## 2018-01-14 DIAGNOSIS — L602 Onychogryphosis: Secondary | ICD-10-CM | POA: Diagnosis not present

## 2018-01-14 DIAGNOSIS — L84 Corns and callosities: Secondary | ICD-10-CM | POA: Diagnosis not present

## 2018-01-14 DIAGNOSIS — E1351 Other specified diabetes mellitus with diabetic peripheral angiopathy without gangrene: Secondary | ICD-10-CM | POA: Diagnosis not present

## 2018-01-21 DIAGNOSIS — H353222 Exudative age-related macular degeneration, left eye, with inactive choroidal neovascularization: Secondary | ICD-10-CM | POA: Diagnosis not present

## 2018-01-21 LAB — HM DIABETES EYE EXAM

## 2018-01-22 DIAGNOSIS — M1611 Unilateral primary osteoarthritis, right hip: Secondary | ICD-10-CM | POA: Diagnosis not present

## 2018-01-22 DIAGNOSIS — M79672 Pain in left foot: Secondary | ICD-10-CM | POA: Diagnosis not present

## 2018-01-22 DIAGNOSIS — M47816 Spondylosis without myelopathy or radiculopathy, lumbar region: Secondary | ICD-10-CM | POA: Diagnosis not present

## 2018-01-22 DIAGNOSIS — S92912A Unspecified fracture of left toe(s), initial encounter for closed fracture: Secondary | ICD-10-CM | POA: Diagnosis not present

## 2018-01-22 DIAGNOSIS — M1612 Unilateral primary osteoarthritis, left hip: Secondary | ICD-10-CM | POA: Diagnosis not present

## 2018-01-27 ENCOUNTER — Encounter: Payer: Self-pay | Admitting: Family Medicine

## 2018-01-27 NOTE — Progress Notes (Signed)
Cardiology Office Note    Date:  01/28/2018   ID:  Katherine Best, DOB 1933/08/27, MRN [id number]  PCP:  Tammi Sou, MD  Cardiologist:  Dr. Harrington Challenger  Chief Complaint: 6 weeks follow up  History of Present Illness:   Katherine Best is a 84 y.o. female with hx of mild CAD by cath 11/2017, DM, HTN, hypothyroidism, chronic diastolic dysfunction, OSA on CPAP and HLD  presents for follow up.   Echo 12/2015 showed LVEF of 60-65% and grade 2 DD.  Recently have continued SOB. Cath 12/07/17 showed mild CAD with elevated R sided pressure. Started on lasix.   Here today for follow up. She only takes lasix 20mg  every other day instead of daily. She does not like to go to bathroom. She eats food outside and mostly high in salt intake. Non compliant with [facility]. Sleeps on few pillows. Says weight has been stable between 191-194lb. No chest pain or palpitations.   Past Medical History:  Diagnosis Date  . Abdominal pain, right lower quadrant   . Cataracts, both eyes    soon to have cataract surg as of 03/2015  . Diabetes mellitus with complication (HCC)    DPN.  NO diab retinopathy as of 06/2017 eye exam.  . Diabetic peripheral neuropathy associated with type 2 diabetes mellitus ([place])   . Diverticulosis of colon    noted on colonoscopy 2008  . Dyspnea    a. 09/2011 CTA Chest: No PE, Ca2+ cors;  b. 09/2011 R&L heart Cath: Relatively nl R heart pressures, nl co/ci, nonobs cad, nl EF.  12/2015 stress test normal, echo with grd II DD--likely explains DOE.  Worsening chronic DOE 2019-->cath w/no agiograph signif CAD.  Pulm HTN/DD.  EF 65%.  Cards started lasix.  Marland Kitchen External hemorrhoid   . Fatigue    w/ ? excessive daytime somnolence; ? OSA--eval by Dr. Halford Chessman 06/2016, home sleep study ordered.  . Former smoker    30 pack-yrs, quit 2005  . Heart murmur    hx of  . Hot flashes    restarted estradiol 01/2015  . Hx of adenomatous polyp of colon 08/25/2016   07/2016 no recall  . Hyperlipemia, mixed    trigs  remain elevated (200s-300s range), but LDL at goal.  . Hypertension   . Hypertensive heart disease 2019  . Hypothyroidism   . IBS (irritable bowel syndrome)    IBS-D (Dr. Carlean Purl) + ? worsened by GI side effect of metformin.  NO pancreatic insufficiency.  As per 09/2017 GI f/u, plan is to continue dicyclomine + f/u with them on prn basis  . Interstitial cystitis   . Liver function study, abnormal   . Lumbar degenerative disc disease    Lumb spondylosis & facet-mediated pain per Dr. Nelva Bush.  Has gotten repeated LB injections.  Eval by Dr. Tonita Cong 03/2017.  Spinal stenosis without neurogenic claudication.  RF neurotomy 05/2017.  Rpeat planned 01/2018--->on L and R L3 and L4 medial branch and L5 dorsal ramus nerves.  . Lumbar scoliosis    lumbosacral spondylosis w/out myelopathy (radiofrequency ablation/lesioning procedure planned for 05/2017).  . Macular degeneration    OS exudative (avastin treatments).  Advanced nonexudative OD, monitor.  AREDS  . Macular hole 07/05/2017   OD; surgically repaired 07/2017.  . Menopausal syndrome   . Morton's neuroma   . Obesity, Class II, BMI 35-39.9   . OSA on CPAP 07/2016   HST 07/27/16 >> AHI 6.2, SaO2 low 79%. Did not tolerate CPAP.  Pt to try again as of 12/2017.  . Osteoarthritis of both hips 2019   surgery NOT recommended ([facility])  . Other specified gastritis without mention of hemorrhage    hx of antral gastritis on EGD 05/2009  . Peripheral neuropathy    ? DPN?--per EMG 11/2012.  Intolerant of gabapentin, failed pamelor.  Radiofrequency neurotomy 12/11/14 helped LB pain (Dr. Nelva Bush)  . Pneumonia   . Recurrent UTI    on daily antibiotic prophylaxis by her urologist.  . RLS (restless legs syndrome)   . Sleep apnea   . Tarsal tunnel syndrome   . Trochanteric bursitis of both hips 2019   Injections: [facility]  . Unspecified venous (peripheral) insufficiency     Past Surgical History:  Procedure Laterality Date  . ANKLE SURGERY     bilateral  .  CARDIAC CATHETERIZATION  09/2011;11/2017   2013: Nonobstructive CAD. 2019: mild pulmonary hypertension. LV end diastolic pressure is moderately elevated.  EF >65%.  DD/hypertensive heart dz-->BP control and add ASA 81mg .  . CARDIOVASCULAR STRESS TEST  01/04/2016   Normal stress nuclear study with no ischemia or infarction; EF 82 with normal wall motion  . CATARACT EXTRACTION    . COLONOSCOPY  2002/2005, 11/14/06, and 05/2009   polyps 2002 but none 2005 or 2008 or 2011.  Repeat 08/17/16 for diarrhea: one diminutive adenoma + small polyps removed, diverticulosis noted--no further colonoscopies needed (Dr. Carlean Purl).  . CT angiogram chest  11/05/2017   NEG for PE.  Marland Kitchen DEXA  10/2016   NORMAL--consider repeat 2 yrs.  . ESOPHAGOGASTRODUODENOSCOPY  05/2009   Moderate antral gastritis  . FOOT SURGERY     R foot bunionectomy and arthroplasty of digits R foot.  Arthroplasty digits 3 and 4 L foot.  Marland Kitchen HAMMER TOE SURGERY     bilateral  . left tarsal tunnel release     sept '11 (Dr Beola Cord)  . PARTIAL KNEE ARTHROPLASTY Right 11/06/2016   Procedure: UNICOMPARTMENTAL RIGHT KNEE- Medially;  Surgeon: Paralee Cancel, MD;  Location: [facility];  Service: Orthopedics;  Laterality: Right;  90 mins  . PFTs  12/05/2017   no obstruction, mild restriction. No reversibility. Moderate DLCO defect --continue prn albut  . radiofrequency neurotomy  06/18/2015   bilat L3-4 medial branch nerve and bilat L5 dorsal ramus nerve.(Dr. Nelva Bush)  . RIGHT/LEFT HEART CATH AND CORONARY ANGIOGRAPHY N/A 12/07/2017   Procedure: RIGHT/LEFT HEART CATH AND CORONARY ANGIOGRAPHY;  Surgeon: Leonie Man, MD;  Location: [facility];  Service: Cardiovascular;  Laterality: N/A;  . TONSILLECTOMY    . TOTAL ABDOMINAL HYSTERECTOMY W/ BILATERAL SALPINGOOPHORECTOMY    . TRANSTHORACIC ECHOCARDIOGRAM  08/2011; 2017   2013 Grade I DD; 2017 EF 60-65%, normal wall motion, grd II DD.    Current Medications: Prior to Admission medications   Medication Sig  Start Date End Date Taking? Authorizing Provider  albuterol (PROVENTIL HFA;VENTOLIN HFA) 108 (90 Base) MCG/ACT inhaler Inhale 2 puffs into the lungs every 6 (six) hours as needed for wheezing or shortness of breath. 10/10/17  Yes [name], [name], MD  ALPRAZolam Duanne Moron) 0.25 MG tablet Take 0.25 mg by mouth at bedtime as needed for anxiety.   Yes [provider]  aspirin 81 MG chewable tablet Chew 1 tablet (81 mg total) by mouth 2 (two) times daily. for 4 weeks post-operative 11/07/16  Yes Constable, Amber, PA-C  atorvastatin (LIPITOR) 20 MG tablet TAKE ONE TABLET BY MOUTH DAILY 12/10/17  Yes [name], [name], MD  calcium carbonate (CALCIUM 600)  600 MG TABS tablet Take 600 mg by mouth daily.    Yes [provider]  celecoxib (CELEBREX) 200 MG capsule Take 1 capsule (200 mg total) by mouth daily. 07/30/17  Yes [name], [name], MD  diclofenac sodium (VOLTAREN) 1 % GEL Apply 2 g topically 4 (four) times daily.  08/21/17  Yes [provider]  dicyclomine (BENTYL) 10 MG capsule Take 1 tab 2-3 times daily for diarrhea/urgency. 05/21/17  Yes Esterwood, Amy S, PA-C  DULoxetine (CYMBALTA) 60 MG capsule TAKE ONE CAPSULE BY MOUTH DAILY 08/21/17  Yes [name], [name], MD  estradiol (ESTRACE) 0.5 MG tablet Take 0.5 mg by mouth at bedtime.   Yes [provider]  ferrous sulfate 325 (65 FE) MG tablet Take 1 tablet (325 mg total) by mouth 2 (two) times daily with a meal. 11/07/16  Yes Constable, Amber, PA-C  furosemide (LASIX) 20 MG tablet 1 tab po qd PRN increase in LE edema 01/10/18  Yes [name], [name], MD  glucose blood test strip Use to check blood sugar once daily 08/09/16  Yes [name], [name], MD  Lancets 30G MISC Use to check blood sugar once daily 08/09/16  Yes [name], [name], MD  levothyroxine (SYNTHROID, LEVOTHROID) 25 MCG tablet TAKE ONE TABLET BY MOUTH DAILY 09/10/17  Yes [name], [name], MD  loratadine (CLARITIN) 10 MG tablet Take 10 mg by mouth daily as needed for  allergies.   Yes [provider]  losartan-hydrochlorothiazide (HYZAAR) 50-12.5 MG tablet TAKE ONE TABLET BY MOUTH DAILY 09/17/17  Yes [name], [name], MD  LYRICA 50 MG capsule TAKE ONE CAPSULE BY MOUTH TWICE DAILY 11/19/17  Yes [name], [name], MD  metFORMIN (GLUCOPHAGE-XR) 500 MG 24 hr tablet TAKE TWO TABLETS BY MOUTH DAILY WITH BREAKFAST 09/17/17  Yes [name], [name], MD  metoprolol tartrate (LOPRESSOR) 50 MG tablet TAKE ONE TABLET BY MOUTH TWICE DAILY 09/17/17  Yes [name], [name], MD  mirtazapine (REMERON) 15 MG tablet Take 0.5 tablets (7.5 mg total) by mouth at bedtime. 03/22/17  Yes [name], [name], MD  Multiple Vitamins-Minerals (MULTIVITAMIN ADULT PO) Take 1 tablet by mouth daily.    Yes [provider]  Multiple Vitamins-Minerals (PRESERVISION AREDS 2 PO) Take 1 tablet by mouth daily.    Yes [provider]  omeprazole (PRILOSEC) 20 MG capsule TAKE ONE CAPSULE BY MOUTH DAILY 07/16/17  Yes [name], [name], MD  OVER THE COUNTER MEDICATION Take 1 drop by mouth daily.    Yes [provider]  oxybutynin (DITROPAN-XL) 10 MG 24 hr tablet Take 10 mg by mouth daily as needed (urine frequency).  11/04/13  Yes [provider]  pioglitazone (ACTOS) 15 MG tablet TAKE ONE TABLET BY MOUTH DAILY 07/16/17  Yes [name], [name], MD  potassium chloride SA (K-DUR,KLOR-CON) 20 MEQ tablet Take 1 tablet (20 mEq total) by mouth daily. 03/22/17  Yes [name], [name], MD  tiZANidine (ZANAFLEX) 4 MG tablet Take 4 mg by mouth every 8 (eight) hours as needed for muscle spasms.  11/22/16  Yes [provider]  HYDROcodone-acetaminophen (NORCO/VICODIN) 5-325 MG tablet Take 1 tablet by mouth 3 (three) times daily as needed. 01/22/18   [provider]  ondansetron (ZOFRAN) 4 MG tablet Take 1 tablet by mouth as needed.    [provider]    Allergies:   Gabapentin and Sulfonamide derivatives   Social History   Socioeconomic History  .  Marital status: Married    Spouse name: Not on  file  . Number of children: Not on file  . Years of education: Not on file  . Highest education level: Not on file  Occupational History  . Not on file  Social Needs  . Financial resource strain: Not on file  . Food insecurity:    Worry: Not on file    Inability: Not on file  . Transportation needs:    Medical: Not on file    Non-medical: Not on file  Tobacco Use  . Smoking status: Former Smoker    Packs/day: 1.00    Years: 35.00    Pack years: 35.00    Types: Cigarettes    Last attempt to quit: 03/27/2002    Years since quitting: 15.8  . Smokeless tobacco: Never Used  Substance and Sexual Activity  . Alcohol use: No    Alcohol/week: 0.0 standard drinks  . Drug use: No  . Sexual activity: Not Currently  Lifestyle  . Physical activity:    Days per week: Not on file    Minutes per session: Not on file  . Stress: Not on file  Relationships  . Social connections:    Talks on phone: Not on file    Gets together: Not on file    Attends religious service: Not on file    Active member of club or organization: Not on file    Attends meetings of clubs or organizations: Not on file    Relationship status: Not on file  Other Topics Concern  . Not on file  Social History Narrative   Married '57   3 sons- '60, '61, '64, grandchildren 58 (7 girls, 1 boy)   Lives independently with husband   Patient is a former smoker: quit 2004.   Alcohol use- yes socially not in years.   Illicit Drug use- no   Occupation: Biomedical scientist     Family History:  The patient's family history includes Breast cancer in her mother; Cancer in her mother; Diabetes in her maternal grandfather; Hyperlipidemia in her father; Hypertension in her father; Mental illness in her brother.   ROS:   Please see the history of present illness.    ROS All other systems reviewed and are negative.   PHYSICAL EXAM:   VS:  BP 126/82   Pulse (!) 58   Ht 5\' 1"  (1.549  m)   Wt 196 lb 6.4 oz (89.1 kg)   SpO2 97%   BMI 37.11 kg/m    GEN: Well nourished, well developed, in no acute distress  HEENT: normal  Neck: no JVD, carotid bruits, or masses Cardiac: RRR; no murmurs, rubs, or gallops, Trace to 1 + BL LE edema  Respiratory:  clear to auscultation bilaterally, normal work of breathing GI: soft, nontender, nondistended, + BS MS: no deformity or atrophy  Skin: warm and dry, no rash Neuro:  Alert and Oriented x 3, Strength and sensation are intact Psych: euthymic mood, full affect  Wt Readings from Last 3 Encounters:  01/28/18 196 lb 6.4 oz (89.1 kg)  01/10/18 195 lb 2 oz (88.5 kg)  12/17/17 195 lb 9.6 oz (88.7 kg)      Studies/Labs Reviewed:   EKG:  EKG is not ordered today.   Recent Labs: 11/01/2017: ALT 21; TSH 3.06 12/04/2017: Hemoglobin 12.3; Platelets 324 12/17/2017: NT-Pro BNP 199 12/27/2017: BUN 18; Creatinine, Ser 0.79; Potassium 4.4; Sodium 140   Lipid Panel    Component Value Date/Time   CHOL 154 04/17/2017 0803   TRIG 334 (H)  04/17/2017 0803   HDL 46 (L) 04/17/2017 0803   CHOLHDL 3.3 04/17/2017 0803   VLDL 56 (H) 12/27/2015 1123   LDLCALC 68 04/17/2017 0803   LDLDIRECT 76.0 01/18/2015 0826    Additional studies/ records that were reviewed today include:     RIGHT/LEFT HEART CATH AND CORONARY ANGIOGRAPHY  11/2017  Conclusion     Hemodynamic findings consistent with mild pulmonary hypertension. LV end diastolic pressure is moderately elevated.  The left ventricular ejection fraction is greater than 65% by visual estimate.  Mild-moderate three-vessel CAD:  Prox RCA lesion is 20% stenosed. Mid RCA lesion is 20% stenosed.  Prox LAD to Mid LAD lesion is 35% stenosed.  Mid Cx lesion is 45% stenosed.   Angiographically minimal coronary disease in the LAD, mid circumflex and RCA. Normal EF with low normal cardiac output/index by thermodilution, but normal by Fick Mild pulmonary hypertension with elevated LVEDP and PCWP  -this is in the setting of systemic hypertension.  Suspect related to diastolic dysfunction.  Plan: Venous sheath removal in the PACU holding area Dissipate discharge home today after bedrest. Recommend more aggressive blood pressure control and consider adding diuretic.  Recommend Aspirin 81mg  daily for moderate CAD.      ASSESSMENT & PLAN:    1. Mild CAD - No angina. Continue ASA, statin and BB.   2. Chronic diastolic CHF - Cath 09/3218 showed mild pulmonary hypertension with elevated LVEDP and PCWP. Dyspnea is improved since started on diuretics and CPAP. Continues to have LE edema. Her symptoms is due to high salt intake. Dietary education given however seems poor inside and does not wants to change lifestyle. No does not want to take lasix daily. She is risk for acute exacerbation.   3. HTN -BP stable on current medicaiton  4. OSA on CPAP - non compliant due to mask issue. Has appointment with pulmonary for adjustment.   5. DM - Per PCP     Medication Adjustments/Labs and Tests Ordered: Current medicines are reviewed at length with the patient today.  Concerns regarding medicines are outlined above.  Medication changes, Labs and Tests ordered today are listed in the Patient Instructions below. Patient Instructions  Medication Instructions:  Your physician recommends that you continue on your current medications as directed. Please refer to the Current Medication list given to you today.  If you need a refill on your cardiac medications before your next appointment, please call your pharmacy.   Lab work: None ordered  If you have labs (blood work) drawn today and your tests are completely normal, you will receive your results only by: Marland Kitchen MyChart Message (if you have MyChart) OR . A paper copy in the mail If you have any lab test that is abnormal or we need to change your treatment, we will call you to review the results.  Testing/Procedures: None  ordered  Follow-Up: . Your physician recommends that you schedule a follow-up appointment in: 3 MONTHS WITH DR. ROSS .   Any Other Special Instructions Will Be Listed Below (If Applicable).       Jarrett Soho, [place]  01/28/2018 1:03 PM    [facility] [address], [place], [place]  [address] Phone: [phone]; Fax: [phone]

## 2018-01-28 ENCOUNTER — Telehealth: Payer: Self-pay | Admitting: Pulmonary Disease

## 2018-01-28 ENCOUNTER — Encounter: Payer: Self-pay | Admitting: Physician Assistant

## 2018-01-28 ENCOUNTER — Ambulatory Visit (INDEPENDENT_AMBULATORY_CARE_PROVIDER_SITE_OTHER): Payer: Medicare Other | Admitting: Physician Assistant

## 2018-01-28 VITALS — BP 126/82 | HR 58 | Ht 61.0 in | Wt 196.4 lb

## 2018-01-28 DIAGNOSIS — I2510 Atherosclerotic heart disease of native coronary artery without angina pectoris: Secondary | ICD-10-CM

## 2018-01-28 DIAGNOSIS — I5032 Chronic diastolic (congestive) heart failure: Secondary | ICD-10-CM | POA: Diagnosis not present

## 2018-01-28 DIAGNOSIS — Z9989 Dependence on other enabling machines and devices: Secondary | ICD-10-CM | POA: Diagnosis not present

## 2018-01-28 DIAGNOSIS — I10 Essential (primary) hypertension: Secondary | ICD-10-CM

## 2018-01-28 DIAGNOSIS — G4733 Obstructive sleep apnea (adult) (pediatric): Secondary | ICD-10-CM

## 2018-01-28 NOTE — Patient Instructions (Signed)
Medication Instructions:  Your physician recommends that you continue on your current medications as directed. Please refer to the Current Medication list given to you today.  If you need a refill on your cardiac medications before your next appointment, please call your pharmacy.   Lab work: None ordered  If you have labs (blood work) drawn today and your tests are completely normal, you will receive your results only by: Marland Kitchen MyChart Message (if you have MyChart) OR . A paper copy in the mail If you have any lab test that is abnormal or we need to change your treatment, we will call you to review the results.  Testing/Procedures: None ordered  Follow-Up: . Your physician recommends that you schedule a follow-up appointment in: 3 MONTHS WITH DR. ROSS .   Any Other Special Instructions Will Be Listed Below (If Applicable).

## 2018-01-28 NOTE — Telephone Encounter (Signed)
Can send an order to have her DME refit her CPAP mask.

## 2018-01-28 NOTE — Telephone Encounter (Signed)
Called and spoke with patient she stated that for the last 3 nights she has been unable to sleep on the CPAP machine. She stated that the nose piece pinches her and it bothers her during the night.   VS please advise, thank you.

## 2018-01-29 NOTE — Telephone Encounter (Signed)
Called patient, unable to reach left message to give us a call back. Order has been placed.  

## 2018-02-01 ENCOUNTER — Encounter: Payer: Self-pay | Admitting: Family Medicine

## 2018-02-04 ENCOUNTER — Other Ambulatory Visit: Payer: Self-pay | Admitting: Family Medicine

## 2018-02-04 NOTE — Telephone Encounter (Signed)
RF request for celecoxib LOV: 01/10/18 Next ov: None Last written: 07/30/17 #90 w/ 1RF  RF request for potassium Last written: 03/22/17 w/ 3RF  Please advise. Thanks.

## 2018-02-07 DIAGNOSIS — M6281 Muscle weakness (generalized): Secondary | ICD-10-CM | POA: Diagnosis not present

## 2018-02-07 DIAGNOSIS — M79672 Pain in left foot: Secondary | ICD-10-CM | POA: Diagnosis not present

## 2018-02-07 DIAGNOSIS — E1351 Other specified diabetes mellitus with diabetic peripheral angiopathy without gangrene: Secondary | ICD-10-CM | POA: Diagnosis not present

## 2018-02-07 DIAGNOSIS — S92912D Unspecified fracture of left toe(s), subsequent encounter for fracture with routine healing: Secondary | ICD-10-CM | POA: Diagnosis not present

## 2018-02-08 DIAGNOSIS — M47816 Spondylosis without myelopathy or radiculopathy, lumbar region: Secondary | ICD-10-CM | POA: Diagnosis not present

## 2018-02-08 DIAGNOSIS — M5136 Other intervertebral disc degeneration, lumbar region: Secondary | ICD-10-CM | POA: Diagnosis not present

## 2018-02-14 ENCOUNTER — Telehealth: Payer: Self-pay | Admitting: *Deleted

## 2018-02-14 NOTE — Telephone Encounter (Signed)
Paperwork completed and handed to [employer].

## 2018-02-14 NOTE — Telephone Encounter (Signed)
SW pt, and advised her that Dr. [name] is working on getting this paperwork completed. He has been very busy lately. She stated that she understands and is in no rush but does need it done before 03/15/18. That is when she has her next f/u with the foot doctor.   Form should still be on Dr. Idelle Leech desk.

## 2018-02-14 NOTE — Telephone Encounter (Signed)
Copied from [place] [phone]. Topic: Quick Communication - Office Called Patient (Clinic Use ONLY) >> Feb 14, 2018 10:52 AM Lennox Solders wrote: Reason for CRM:pt is return Katherine Best call concerning dm shoe.

## 2018-02-15 NOTE — Telephone Encounter (Signed)
Paperwork faxed °

## 2018-02-18 ENCOUNTER — Other Ambulatory Visit: Payer: Self-pay | Admitting: Family Medicine

## 2018-03-01 DIAGNOSIS — M1612 Unilateral primary osteoarthritis, left hip: Secondary | ICD-10-CM | POA: Diagnosis not present

## 2018-03-01 DIAGNOSIS — M1611 Unilateral primary osteoarthritis, right hip: Secondary | ICD-10-CM | POA: Diagnosis not present

## 2018-03-11 ENCOUNTER — Other Ambulatory Visit: Payer: Self-pay | Admitting: Family Medicine

## 2018-03-11 ENCOUNTER — Ambulatory Visit (INDEPENDENT_AMBULATORY_CARE_PROVIDER_SITE_OTHER): Payer: Medicare Other | Admitting: Pulmonary Disease

## 2018-03-11 ENCOUNTER — Encounter: Payer: Self-pay | Admitting: Pulmonary Disease

## 2018-03-11 VITALS — BP 116/70 | HR 66 | Ht 62.0 in | Wt 196.0 lb

## 2018-03-11 DIAGNOSIS — G4730 Sleep apnea, unspecified: Secondary | ICD-10-CM | POA: Diagnosis not present

## 2018-03-11 DIAGNOSIS — G4733 Obstructive sleep apnea (adult) (pediatric): Secondary | ICD-10-CM

## 2018-03-11 DIAGNOSIS — E669 Obesity, unspecified: Secondary | ICD-10-CM

## 2018-03-11 DIAGNOSIS — I2510 Atherosclerotic heart disease of native coronary artery without angina pectoris: Secondary | ICD-10-CM

## 2018-03-11 NOTE — Progress Notes (Signed)
[facility]  Chief Complaint  Patient presents with  . Follow-up    Pt is doing well overall with new cpap machine    Constitutional:  BP 116/70 (BP Location: Left Arm, Cuff Size: Normal)   Pulse 66   Ht 5\' 2"  (1.575 m)   Wt 196 lb (88.9 kg)   SpO2 97%   BMI 35.85 kg/m   Past Medical History:  RLS, PNA, Recurrent UTI, Peripheral neuropathy, OA, Macular degeneration, Scoliosis, IBS, Hypothyroidism, HTN, HLD, Colon polyp, Diverticulosis, Diastolic CHF, DM, Cataracts  Brief Summary:  Katherine Best is a 84 y.o. female former smoker with obstructive sleep apnea.  Since her last visit she did PFT > mild restriction and diffusion defect, and LHC > non obstructive CAD.  Breathing has been okay.  Gets chest discomfort when she bends over to pick something up.  Goes away when she sits up again.  Doing well with CPAP.  Uses full face mask.  Planning to try nasal pillows in January.  Has occasional sinus congestion.  No having dry mouth, sore throat, or aerophagia.  She has problem with back pain and has to sleep on her side.  Physical Exam:   Appearance - well kempt   ENMT - clear nasal mucosa, midline nasal  septum, no oral exudates, no LAN, trachea midline, MP 4, narrow nasal angles  Respiratory - normal chest wall, normal respiratory effort, no accessory muscle use, no wheeze/rales  CV - s1s2 regular rate and rhythm, no murmurs, no peripheral edema, radial pulses symmetric  GI - soft, non tender, no masses  Lymph - no adenopathy noted in neck and axillary areas  MSK - uses a cane  Ext - no cyanosis, clubbing, or joint inflammation noted  Skin - no rashes, lesions, or ulcers  Neuro - normal strength, oriented x 3  Psych - normal mood and affect  Assessment/Plan:   Obstructive sleep apnea. - she is compliant with CPAP and reports benefit from therapy - continue auto CPAP - discussed options to assist with mask  fit  Obesity. - discussed options to assist with weight loss  Patient Instructions  Follow up in 1 year    Chesley Mires, MD [place] Pager: [phone] 03/11/2018, 10:46 AM  Flow Sheet     Pulmonary tests:  CT angio chest 11/05/17 >> no PE  PFT 12/05/17 >> FEV1 1.43 (89%), FEV1% 89, TLC 2.93 (63%), DLCO 56%  Sleep tests:  HST 07/27/16 >> AHI 6.2, SaO2 low 79% HST 12/11/17 >> AHI 13.3, SpO2 low 84%. Auto CPAP 02/06/18 to 03/07/18 >> used on 29 of 30 nights with average 5 hrs 53 min.  Average AHI 0.3 with median CPAP 6 and 95 th percentile CPAP 10 cm H2O  Cardiac tests:  Echo 01/04/16 >> mod LVH, EF 60 to 65%, grade 2 DD LHC 12/07/17 >> medical management   Medications:   Allergies as of 03/11/2018      Reactions   Gabapentin Itching   Sulfonamide Derivatives Nausea Only   REACTION: Nausea      Medication List       Accurate as of March 11, 2018 10:46 AM. Always use your most recent med list.        albuterol 108 (90 Base) MCG/ACT inhaler Commonly known as:  PROVENTIL HFA;VENTOLIN HFA Inhale 2 puffs into the lungs every 6 (six) hours as needed for wheezing or shortness of breath.   ALPRAZolam 0.25 MG tablet Commonly known  as:  XANAX Take 0.25 mg by mouth at bedtime as needed for anxiety.   aspirin 81 MG chewable tablet Chew 1 tablet (81 mg total) by mouth 2 (two) times daily. for 4 weeks post-operative   atorvastatin 20 MG tablet Commonly known as:  LIPITOR TAKE ONE TABLET BY MOUTH DAILY   CALCIUM 600 600 MG Tabs tablet Generic drug:  calcium carbonate Take 600 mg by mouth daily.   celecoxib 200 MG capsule Commonly known as:  CELEBREX Take 1 capsule (200 mg total) by mouth daily.   diclofenac sodium 1 % Gel Commonly known as:  VOLTAREN Apply 2 g topically 4 (four) times daily.   dicyclomine 10 MG capsule Commonly known as:  BENTYL Take 1 tab 2-3 times daily for diarrhea/urgency.   DULoxetine 60 MG capsule Commonly  known as:  CYMBALTA TAKE ONE CAPSULE BY MOUTH DAILY   estradiol 0.5 MG tablet Commonly known as:  ESTRACE Take 0.5 mg by mouth at bedtime.   ferrous sulfate 325 (65 FE) MG tablet Take 1 tablet (325 mg total) by mouth 2 (two) times daily with a meal.   furosemide 20 MG tablet Commonly known as:  LASIX 1 tab po qd PRN increase in LE edema   glucose blood test strip Use to check blood sugar once daily   HYDROcodone-acetaminophen 5-325 MG tablet Commonly known as:  NORCO/VICODIN Take 1 tablet by mouth 3 (three) times daily as needed.   Lancets 30G Misc Use to check blood sugar once daily   levothyroxine 25 MCG tablet Commonly known as:  SYNTHROID, LEVOTHROID TAKE ONE TABLET BY MOUTH DAILY   loratadine 10 MG tablet Commonly known as:  CLARITIN Take 10 mg by mouth daily as needed for allergies.   losartan-hydrochlorothiazide 50-12.5 MG tablet Commonly known as:  HYZAAR TAKE ONE TABLET BY MOUTH DAILY   LYRICA 50 MG capsule Generic drug:  pregabalin TAKE ONE CAPSULE BY MOUTH TWICE DAILY   metFORMIN 500 MG 24 hr tablet Commonly known as:  GLUCOPHAGE-XR TAKE TWO TABLETS BY MOUTH DAILY WITH BREAKFAST   metoprolol tartrate 50 MG tablet Commonly known as:  LOPRESSOR TAKE ONE TABLET BY MOUTH TWICE DAILY   mirtazapine 15 MG tablet Commonly known as:  REMERON Take 0.5 tablets (7.5 mg total) by mouth at bedtime.   MULTIVITAMIN ADULT PO Take 1 tablet by mouth daily.   PRESERVISION AREDS 2 PO Take 1 tablet by mouth daily.   omeprazole 20 MG capsule Commonly known as:  PRILOSEC TAKE ONE CAPSULE BY MOUTH DAILY   ondansetron 4 MG tablet Commonly known as:  ZOFRAN Take 1 tablet by mouth as needed.   OVER THE COUNTER MEDICATION Take 1 drop by mouth daily.   oxybutynin 10 MG 24 hr tablet Commonly known as:  DITROPAN-XL Take 10 mg by mouth daily as needed (urine frequency).   pioglitazone 15 MG tablet Commonly known as:  ACTOS TAKE ONE TABLET BY MOUTH DAILY    potassium chloride SA 20 MEQ tablet Commonly known as:  K-DUR,KLOR-CON Take 1 tablet (20 mEq total) by mouth daily.   tiZANidine 4 MG tablet Commonly known as:  ZANAFLEX Take 4 mg by mouth every 8 (eight) hours as needed for muscle spasms.       Past Surgical History:  She  has a past surgical history that includes Total abdominal hysterectomy w/ bilateral salpingoophorectomy; Tonsillectomy; Foot surgery; left tarsal tunnel release; Colonoscopy (2002/2005, 11/14/06, and 05/2009); Hammer toe surgery; Ankle surgery; Esophagogastroduodenoscopy (05/2009); Cardiac catheterization (09/2011;11/2017); transthoracic echocardiogram (  08/2011; 2017); radiofrequency neurotomy (06/18/2015); Cardiovascular stress test (01/04/2016); Cataract extraction; DEXA (10/2016); Partial knee arthroplasty (Right, 11/06/2016); CT angiogram chest (11/05/2017); RIGHT/LEFT HEART CATH AND CORONARY ANGIOGRAPHY (N/A, 12/07/2017); and PFTs (12/05/2017).  Family History:  Her family history includes Breast cancer in her mother; Cancer in her mother; Diabetes in her maternal grandfather; Hyperlipidemia in her father; Hypertension in her father; Mental illness in her brother.  Social History:  She  reports that she quit smoking about 15 years ago. Her smoking use included cigarettes. She has a 35.00 pack-year smoking history. She has never used smokeless tobacco. She reports that she does not drink alcohol or use drugs.

## 2018-03-11 NOTE — Patient Instructions (Signed)
Follow up in 1 year.

## 2018-03-25 ENCOUNTER — Other Ambulatory Visit: Payer: Self-pay | Admitting: Family Medicine

## 2018-03-25 ENCOUNTER — Ambulatory Visit: Payer: Self-pay

## 2018-03-25 DIAGNOSIS — L602 Onychogryphosis: Secondary | ICD-10-CM | POA: Diagnosis not present

## 2018-03-25 DIAGNOSIS — M205X2 Other deformities of toe(s) (acquired), left foot: Secondary | ICD-10-CM | POA: Diagnosis not present

## 2018-03-25 DIAGNOSIS — L84 Corns and callosities: Secondary | ICD-10-CM | POA: Diagnosis not present

## 2018-03-25 DIAGNOSIS — L97529 Non-pressure chronic ulcer of other part of left foot with unspecified severity: Secondary | ICD-10-CM | POA: Diagnosis not present

## 2018-03-25 DIAGNOSIS — E1351 Other specified diabetes mellitus with diabetic peripheral angiopathy without gangrene: Secondary | ICD-10-CM | POA: Diagnosis not present

## 2018-03-25 DIAGNOSIS — I10 Essential (primary) hypertension: Secondary | ICD-10-CM

## 2018-03-25 MED ORDER — METFORMIN HCL ER 500 MG PO TB24: 1000.0000 mg | ORAL_TABLET | Freq: Every day | ORAL | 1 refills | Status: DC

## 2018-03-25 NOTE — Telephone Encounter (Signed)
  Reason for Disposition . General information question, no triage required and triager able to answer question . Pharmacy calling with prescription questions and triager unable to answer question  Protocols used: INFORMATION ONLY CALL-A-AH, MEDICATION QUESTION CALL-A-AH

## 2018-03-25 NOTE — Telephone Encounter (Signed)
Pharmacy called in regards to Rx declined and Rx sent for metformin. Chandler with [facility] stated that pt has been getting metformin XR 500mg  take 2 tabs daily. Rx that was sent was for plain metformin 1000mg  BID. Reviewed chart and last ov note, pt is to continue taking metformin XR 500mg  2 tab qAM (equals 1000mg ). Advised Chandler to cancel Rx for 1000mg  metformin and I will send in Rx for metformin XR 500mg  2 tab qAM.

## 2018-03-25 NOTE — Telephone Encounter (Signed)
[facility] called and spoke to [place], Education administrator about the call to the office. She says they received a fax from the office saying the patient is supposed to be on Metformin 1000 mg every morning, so they want to know if it's the regular or the extended release. The patient has been receiving Metformin XR 500 mg 2 tabs every morning. I advised according to last OV note, Dr. [name] noted Metformin XR 1000 mg QAM. Desiree says that's what the fax indicated, so that is what she needed clearing up.

## 2018-03-25 NOTE — Telephone Encounter (Signed)
RF request for metformin 500 mg refused.  Okay to send in Metformin 1000mg  as documented in last OV?   Please advise.

## 2018-03-27 DIAGNOSIS — G20A1 Parkinson's disease without dyskinesia, without mention of fluctuations: Secondary | ICD-10-CM

## 2018-03-27 DIAGNOSIS — G20 Parkinson's disease: Secondary | ICD-10-CM

## 2018-03-27 HISTORY — DX: Parkinson's disease without dyskinesia, without mention of fluctuations: G20.A1

## 2018-03-27 HISTORY — DX: Parkinson's disease: G20

## 2018-04-01 ENCOUNTER — Other Ambulatory Visit: Payer: Self-pay | Admitting: *Deleted

## 2018-04-02 ENCOUNTER — Encounter: Payer: Self-pay | Admitting: Family Medicine

## 2018-04-02 DIAGNOSIS — M5136 Other intervertebral disc degeneration, lumbar region: Secondary | ICD-10-CM | POA: Insufficient documentation

## 2018-04-03 ENCOUNTER — Other Ambulatory Visit: Payer: Self-pay | Admitting: *Deleted

## 2018-04-08 ENCOUNTER — Other Ambulatory Visit: Payer: Self-pay | Admitting: Family Medicine

## 2018-04-08 DIAGNOSIS — H353222 Exudative age-related macular degeneration, left eye, with inactive choroidal neovascularization: Secondary | ICD-10-CM | POA: Diagnosis not present

## 2018-04-10 ENCOUNTER — Encounter: Payer: Self-pay | Admitting: Family Medicine

## 2018-04-10 MED ORDER — GLUCOSE BLOOD VI STRP: ORAL_STRIP | 11 refills | Status: DC

## 2018-04-10 NOTE — Addendum Note (Signed)
Addended by: Onalee Hua on: 04/10/2018 01:25 PM   Modules accepted: Orders

## 2018-04-16 ENCOUNTER — Ambulatory Visit: Payer: Medicare Other | Admitting: Family Medicine

## 2018-04-16 ENCOUNTER — Encounter: Payer: Self-pay | Admitting: Family Medicine

## 2018-04-17 ENCOUNTER — Ambulatory Visit: Payer: Medicare Other | Admitting: Family Medicine

## 2018-04-17 ENCOUNTER — Telehealth: Payer: Self-pay | Admitting: Family Medicine

## 2018-04-17 MED ORDER — GLUCOSE BLOOD VI STRP: ORAL_STRIP | 11 refills | Status: DC

## 2018-04-17 NOTE — Telephone Encounter (Signed)
Copied from [place]. Topic: Quick Communication - See Telephone Encounter >> Apr 17, 2018  1:23 PM Conception Chancy, NT wrote: CRM for notification. See Telephone encounter for: 04/17/18.  [facility] is calling in regards to patients one touch ultra blue test strips and states they were needing a diagnostic code and the code that was sent over Medicare B will not accept. She states they are needing a new script with the diagnosis code sent over.  [id number] - [place], [place] - [address] [place] [address] Phone: [phone] Fax: [phone]

## 2018-04-17 NOTE — Telephone Encounter (Signed)
Rx resend with different Dx code (E11.42).

## 2018-04-24 ENCOUNTER — Encounter: Payer: Self-pay | Admitting: Family Medicine

## 2018-04-24 ENCOUNTER — Ambulatory Visit (INDEPENDENT_AMBULATORY_CARE_PROVIDER_SITE_OTHER): Payer: Medicare Other | Admitting: Family Medicine

## 2018-04-24 VITALS — BP 120/58 | HR 70 | Temp 97.7°F | Resp 16 | Ht 62.0 in | Wt 200.1 lb

## 2018-04-24 DIAGNOSIS — H6122 Impacted cerumen, left ear: Secondary | ICD-10-CM

## 2018-04-24 DIAGNOSIS — F411 Generalized anxiety disorder: Secondary | ICD-10-CM | POA: Diagnosis not present

## 2018-04-24 DIAGNOSIS — R0609 Other forms of dyspnea: Secondary | ICD-10-CM

## 2018-04-24 DIAGNOSIS — H9190 Unspecified hearing loss, unspecified ear: Secondary | ICD-10-CM | POA: Diagnosis not present

## 2018-04-24 DIAGNOSIS — I10 Essential (primary) hypertension: Secondary | ICD-10-CM | POA: Diagnosis not present

## 2018-04-24 DIAGNOSIS — H9313 Tinnitus, bilateral: Secondary | ICD-10-CM

## 2018-04-24 DIAGNOSIS — E1149 Type 2 diabetes mellitus with other diabetic neurological complication: Secondary | ICD-10-CM

## 2018-04-24 LAB — POCT GLYCOSYLATED HEMOGLOBIN (HGB A1C): Hemoglobin A1C: 6.1 % — AB (ref 4.0–5.6)

## 2018-04-24 NOTE — Progress Notes (Signed)
OFFICE VISIT  04/24/2018   CC:  Chief Complaint  Patient presents with  . Follow-up    RCI, pt is fasitng.    HPI:    Patient is a 85 y.o. Caucasian female who presents for 3 mo f/u DM 2, HTN, and chronic anxiety.  DM 2: fastings 145 the last 1-2 wks.  Prior to this her avg was 125. Eating a fair diabetic diet. HTN:  Her bp's at home have been all <130/80.   Chronic:  Taking duloxetine qd, but rarely takes xanax.  ROS: still with chronic postprandial urgent BMs. Ringing (faint ring/hum) in both ears x 2 wks, all day long, ? "maybe" hearing impairment along with this.  No ear pains.  No ear drainage.   No nasal congestion or sinus pain. No CP, no dizziness, no HAs, no rashes, no melena/hematochezia.  No polyuria or polydipsia.  No myalgias or arthralgias.     Past Medical History:  Diagnosis Date  . Abdominal pain, right lower quadrant   . Cataracts, both eyes    soon to have cataract surg as of 03/2015  . Diabetes mellitus with complication (HCC)    DPN.  NO diab retinopathy as of 06/2017 eye exam.  . Diabetic peripheral neuropathy associated with type 2 diabetes mellitus ([place])   . Diastolic dysfunction 1027  . Diverticulosis of colon    noted on colonoscopy 2008  . Dyspnea    a. 09/2011 CTA Chest: No PE, Ca2+ cors;  b. 09/2011 R&L heart Cath: Relatively nl R heart pressures, nl co/ci, nonobs cad, nl EF.  12/2015 stress test normal, echo with grd II DD--likely explains DOE.  Worsening chronic DOE 2019-->cath w/no agiograph signif CAD.  Pulm HTN/DD.  EF 65%.  Cards started lasix.  Marland Kitchen External hemorrhoid   . Fatigue    w/ ? excessive daytime somnolence; ? OSA--eval by Dr. Halford Chessman 06/2016, home sleep study ordered.  . Former smoker    30 pack-yrs, quit 2005  . Heart murmur    hx of  . Hot flashes    restarted estradiol 01/2015  . Hx of adenomatous polyp of colon 08/25/2016   07/2016 no recall  . Hyperlipemia, mixed    trigs remain elevated (200s-300s range), but LDL at goal.  .  Hypertension   . Hypertensive heart disease 2019  . Hypothyroidism   . IBS (irritable bowel syndrome)    IBS-D (Dr. Carlean Purl) + ? worsened by GI side effect of metformin.  NO pancreatic insufficiency.  As per 09/2017 GI f/u, plan is to continue dicyclomine + f/u with them on prn basis  . Interstitial cystitis   . Liver function study, abnormal   . Lumbar degenerative disc disease    Lumb spondylosis & facet-mediated pain per Dr. Nelva Bush.  Has gotten repeated LB injections.  Eval by Dr. Tonita Cong 03/2017.  Spinal stenosis without neurogenic claudication.  RF neurotomy 05/2017.  Rpeat planned 01/2018--->on L and R L3 and L4 medial branch and L5 dorsal ramus nerves.  . Lumbar scoliosis    lumbosacral spondylosis w/out myelopathy (radiofrequency ablation/lesioning procedure planned for 05/2017).  . Macular degeneration    OS exudative (avastin treatments).  Advanced nonexudative OD, monitor.  AREDS  . Macular hole 07/05/2017   OD; surgically repaired 07/2017.  . Menopausal syndrome   . Morton's neuroma   . Obesity, Class II, BMI 35-39.9   . OSA on CPAP 07/2016   HST 07/27/16 >> AHI 6.2, SaO2 low 79%. Did not tolerate CPAP.  Pt  to try again as of 12/2017.  Compliant with CPAP as of 02/2018 pulm f/u.  . Osteoarthritis of both hips 2019   surgery NOT recommended ([facility])  . Other specified gastritis without mention of hemorrhage    hx of antral gastritis on EGD 05/2009  . Peripheral neuropathy    ? DPN?--per EMG 11/2012.  Intolerant of gabapentin, failed pamelor.  Radiofrequency neurotomy 12/11/14 helped LB pain (Dr. Nelva Bush)  . Pneumonia   . RLS (restless legs syndrome)   . Tarsal tunnel syndrome   . Trochanteric bursitis of both hips 2019   Injections: [facility]  . Unspecified venous (peripheral) insufficiency     Past Surgical History:  Procedure Laterality Date  . ANKLE SURGERY     bilateral  . CARDIAC CATHETERIZATION  09/2011;11/2017   2013: Nonobstructive CAD. 2019: mild pulmonary  hypertension. LV end diastolic pressure is moderately elevated.  EF >65%.  DD/hypertensive heart dz-->BP control and add ASA 81mg  + lasix qd.  . CARDIOVASCULAR STRESS TEST  01/04/2016   Normal stress nuclear study with no ischemia or infarction; EF 82 with normal wall motion  . CATARACT EXTRACTION    . COLONOSCOPY  2002/2005, 11/14/06, and 05/2009   polyps 2002 but none 2005 or 2008 or 2011.  Repeat 08/17/16 for diarrhea: one diminutive adenoma + small polyps removed, diverticulosis noted--no further colonoscopies needed (Dr. Carlean Purl).  . CT angiogram chest  11/05/2017   NEG for PE.  Marland Kitchen DEXA  10/2016   NORMAL--consider repeat 2 yrs.  . ESOPHAGOGASTRODUODENOSCOPY  05/2009   Moderate antral gastritis  . FOOT SURGERY     R foot bunionectomy and arthroplasty of digits R foot.  Arthroplasty digits 3 and 4 L foot.  Marland Kitchen HAMMER TOE SURGERY     bilateral  . left tarsal tunnel release     sept '11 (Dr Beola Cord)  . PARTIAL KNEE ARTHROPLASTY Right 11/06/2016   Procedure: UNICOMPARTMENTAL RIGHT KNEE- Medially;  Surgeon: Paralee Cancel, MD;  Location: [facility];  Service: Orthopedics;  Laterality: Right;  90 mins  . PFTs  12/05/2017   no obstruction, mild restriction. No reversibility. Moderate DLCO defect --continue prn albut  . radiofrequency neurotomy  06/18/2015   bilat L3-4 medial branch nerve and bilat L5 dorsal ramus nerve.(Dr. Nelva Bush)  . RIGHT/LEFT HEART CATH AND CORONARY ANGIOGRAPHY N/A 12/07/2017   Procedure: RIGHT/LEFT HEART CATH AND CORONARY ANGIOGRAPHY;  Surgeon: Leonie Man, MD;  Location: [facility];  Service: Cardiovascular;  Laterality: N/A;  . TONSILLECTOMY    . TOTAL ABDOMINAL HYSTERECTOMY W/ BILATERAL SALPINGOOPHORECTOMY    . TRANSTHORACIC ECHOCARDIOGRAM  08/2011; 2017   2013 Grade I DD; 2017 EF 60-65%, normal wall motion, grd II DD.    Outpatient Medications Prior to Visit  Medication Sig Dispense Refill  . albuterol (PROVENTIL HFA;VENTOLIN HFA) 108 (90 Base) MCG/ACT inhaler  Inhale 2 puffs into the lungs every 6 (six) hours as needed for wheezing or shortness of breath. 1 Inhaler 1  . ALPRAZolam (XANAX) 0.25 MG tablet Take 0.25 mg by mouth at bedtime as needed for anxiety.    Marland Kitchen aspirin 81 MG chewable tablet Chew 1 tablet (81 mg total) by mouth 2 (two) times daily. for 4 weeks post-operative 60 tablet 0  . atorvastatin (LIPITOR) 20 MG tablet TAKE ONE TABLET BY MOUTH DAILY 90 tablet 1  . calcium carbonate (CALCIUM 600) 600 MG TABS tablet Take 600 mg by mouth daily.     . celecoxib (CELEBREX) 200 MG capsule Take 1 capsule (200 mg  total) by mouth daily. 90 capsule 1  . diclofenac sodium (VOLTAREN) 1 % GEL Apply 2 g topically 4 (four) times daily.   1  . dicyclomine (BENTYL) 10 MG capsule Take 1 tab 2-3 times daily for diarrhea/urgency. 90 capsule 6  . DULoxetine (CYMBALTA) 60 MG capsule TAKE ONE CAPSULE BY MOUTH DAILY 90 capsule 1  . estradiol (ESTRACE) 0.5 MG tablet Take 0.5 mg by mouth at bedtime.    . ferrous sulfate 325 (65 FE) MG tablet Take 1 tablet (325 mg total) by mouth 2 (two) times daily with a meal. 30 tablet 0  . furosemide (LASIX) 20 MG tablet 1 tab po qd PRN increase in LE edema 45 tablet 3  . glucose blood test strip Use to check blood once daily 100 each 11  . HYDROcodone-acetaminophen (NORCO/VICODIN) 5-325 MG tablet Take 1 tablet by mouth 3 (three) times daily as needed.  0  . Lancets 30G MISC Use to check blood sugar once daily 100 each 11  . levothyroxine (SYNTHROID, LEVOTHROID) 25 MCG tablet TAKE ONE TABLET BY MOUTH DAILY 90 tablet 1  . loratadine (CLARITIN) 10 MG tablet Take 10 mg by mouth daily as needed for allergies.    Marland Kitchen losartan-hydrochlorothiazide (HYZAAR) 50-12.5 MG tablet TAKE ONE TABLET BY MOUTH DAILY 90 tablet 1  . LYRICA 50 MG capsule TAKE ONE CAPSULE BY MOUTH TWICE DAILY 180 capsule 1  . metoprolol tartrate (LOPRESSOR) 50 MG tablet TAKE ONE TABLET BY MOUTH TWICE DAILY 180 tablet 1  . mirtazapine (REMERON) 15 MG tablet Take 0.5 tablets  (7.5 mg total) by mouth at bedtime. 90 tablet 1  . Multiple Vitamins-Minerals (MULTIVITAMIN ADULT PO) Take 1 tablet by mouth daily.     . Multiple Vitamins-Minerals (PRESERVISION AREDS 2 PO) Take 1 tablet by mouth daily.     Marland Kitchen omeprazole (PRILOSEC) 20 MG capsule TAKE ONE CAPSULE BY MOUTH DAILY 90 capsule 1  . ondansetron (ZOFRAN) 4 MG tablet Take 1 tablet by mouth as needed.    Marland Kitchen OVER THE COUNTER MEDICATION Take 1 drop by mouth daily.     Marland Kitchen oxybutynin (DITROPAN-XL) 10 MG 24 hr tablet Take 10 mg by mouth daily as needed (urine frequency).     . pioglitazone (ACTOS) 15 MG tablet TAKE ONE TABLET BY MOUTH DAILY 90 tablet 1  . potassium chloride SA (K-DUR,KLOR-CON) 20 MEQ tablet Take 1 tablet (20 mEq total) by mouth daily. 90 tablet 3  . tiZANidine (ZANAFLEX) 4 MG tablet Take 4 mg by mouth every 8 (eight) hours as needed for muscle spasms.   1  . metFORMIN (GLUCOPHAGE-XR) 500 MG 24 hr tablet Take 2 tablets (1,000 mg total) by mouth daily with breakfast. 180 tablet 1   No facility-administered medications prior to visit.     Allergies  Allergen Reactions  . Gabapentin Itching  . Sulfonamide Derivatives Nausea Only    REACTION: Nausea    ROS As per HPI  PE: Blood pressure (!) 120/58, pulse 70, temperature 97.7 F (36.5 C), temperature source Oral, resp. rate 16, height 5\' 2"  (1.575 m), weight 200 lb 2 oz (90.8 kg), SpO2 95 %. Body mass index is 36.6 kg/m.  Gen: Alert, well appearing.  Patient is oriented to person, place, time, and situation. AFFECT: pleasant, lucid thought and speech. R EAC with mod amount of cerumen but I can clearly see a normal TM. L EAC with 90 % cerumen impaction, cannot see TM. Nose: clear CV: RRR, soft systolic murmur, no rub/gallop Lungs: CTA  bilat, nonlabored. EXT: no clubbing or cyanosis.  1+ bilat LL pitting edema.  L hand pill rolling tremor.  LABS:  Lab Results  Component Value Date   TSH 3.06 11/01/2017   Lab Results  Component Value Date   WBC  7.7 12/04/2017   HGB 12.3 12/04/2017   HCT 36.5 12/04/2017   MCV 93 12/04/2017   PLT 324 12/04/2017   Lab Results  Component Value Date   CREATININE 0.79 12/27/2017   BUN 18 12/27/2017   NA 140 12/27/2017   K 4.4 12/27/2017   CL 99 12/27/2017   CO2 24 12/27/2017   Lab Results  Component Value Date   ALT 21 11/01/2017   AST 16 11/01/2017   ALKPHOS 99 11/01/2017   BILITOT 0.4 11/01/2017   Lab Results  Component Value Date   CHOL 154 04/17/2017   Lab Results  Component Value Date   HDL 46 (L) 04/17/2017   Lab Results  Component Value Date   LDLCALC 68 04/17/2017   Lab Results  Component Value Date   TRIG 334 (H) 04/17/2017   Lab Results  Component Value Date   CHOLHDL 3.3 04/17/2017   Lab Results  Component Value Date   TSH 3.06 11/01/2017   POC HbA1c today= 6.1%   Hearing Screening   125Hz  250Hz  500Hz  1000Hz  2000Hz  3000Hz  4000Hz  6000Hz  8000Hz   Right ear:   20 25 45  50    Left ear:   20 30 45  45       IMPRESSION AND PLAN:  1) DM 2, great control. POC A1c 6.1% today. Will do feet exam next f/u visit.  2) HTN: The current medical regimen is effective;  continue present plan and medications. Lytes/cr consistently stable over the last 18 months.  No BMET today.  3) GAD: stable on duloxetine.  Uses xanax very rarely.    4) Bilateral tinnitus with mild hearing deficit at 2000 and 4000 Hz bilat on audiogram today. Left ear 100% irrigated by nurse today, TM normal.  Pt said that ear felt better. At this time we'll simply take a watchful waiting approach to this problem.  5) Chronic DOE that is improved with getting back on CPAP, starting lasix qd, and using albut inhaler q6h prn. She is still significantly debilitated by her chronic back pain.  She'll be getting back injections again soon by Dr. Nelva Bush.  An After Visit Summary was printed and given to the patient.  FOLLOW UP: Return in about 3 months (around 07/24/2018) for routine chronic illness  f/u.  Signed:  Crissie Sickles, MD           04/24/2018

## 2018-04-24 NOTE — Patient Instructions (Signed)
Stop your metformin--hopefully this well help your diarrhea get better.  Take a dose of metamucil every night.

## 2018-04-25 ENCOUNTER — Other Ambulatory Visit: Payer: Self-pay | Admitting: Family Medicine

## 2018-04-25 MED ORDER — ALPRAZOLAM 0.25 MG PO TABS: 0.2500 mg | ORAL_TABLET | Freq: Every evening | ORAL | 1 refills | Status: DC | PRN

## 2018-04-25 NOTE — Telephone Encounter (Signed)
Copied from [place] [id number]. Topic: Quick Communication - Rx Refill/Question >> Apr 25, 2018 10:53 AM Reyne Dumas L wrote: Medication: ALPRAZolam Duanne Moron) 0.25 MG tablet  Has the patient contacted their pharmacy? No - thought she had this at home, but she doesn't so she needs a new script called in based off of what Dr. [name] told her at visit yesterday (Agent: If no, request that the patient contact the pharmacy for the refill.) (Agent: If yes, when and what did the pharmacy advise?)  Preferred Pharmacy (with phone number or street name): [facility] [place], [place], [place] - [address] [phone] (Phone) [phone] (Fax)  Agent: Please be advised that RX refills may take up to 3 business days. We ask that you follow-up with your pharmacy.

## 2018-04-25 NOTE — Telephone Encounter (Signed)
RF request for alprazolam LOV: 04/24/18 Next ov: 07/25/18 Last written: None - looks like we have not Rx'ed this med for pt  Please advise. Thanks.

## 2018-04-26 DIAGNOSIS — M5137 Other intervertebral disc degeneration, lumbosacral region: Secondary | ICD-10-CM | POA: Diagnosis not present

## 2018-05-01 ENCOUNTER — Other Ambulatory Visit: Payer: Self-pay | Admitting: Family Medicine

## 2018-05-01 NOTE — Telephone Encounter (Signed)
RF request for estradiol LOV: 04/24/18 Next ov: 07/25/18 Last written: 03/22/17 #90 w/ 3RF  RF request for mirtazapine Last written: 03/22/17 #90 w/ 1RF  Please advise. Thanks.

## 2018-05-06 DIAGNOSIS — M205X2 Other deformities of toe(s) (acquired), left foot: Secondary | ICD-10-CM | POA: Diagnosis not present

## 2018-05-06 DIAGNOSIS — M6281 Muscle weakness (generalized): Secondary | ICD-10-CM | POA: Diagnosis not present

## 2018-05-06 DIAGNOSIS — E1351 Other specified diabetes mellitus with diabetic peripheral angiopathy without gangrene: Secondary | ICD-10-CM | POA: Diagnosis not present

## 2018-05-09 ENCOUNTER — Encounter: Payer: Self-pay | Admitting: Internal Medicine

## 2018-05-09 ENCOUNTER — Ambulatory Visit (INDEPENDENT_AMBULATORY_CARE_PROVIDER_SITE_OTHER): Payer: Medicare Other | Admitting: Internal Medicine

## 2018-05-09 VITALS — BP 124/58 | HR 75 | Ht 62.0 in | Wt 203.4 lb

## 2018-05-09 DIAGNOSIS — I2510 Atherosclerotic heart disease of native coronary artery without angina pectoris: Secondary | ICD-10-CM | POA: Diagnosis not present

## 2018-05-09 DIAGNOSIS — I5030 Unspecified diastolic (congestive) heart failure: Secondary | ICD-10-CM | POA: Diagnosis not present

## 2018-05-09 DIAGNOSIS — E782 Mixed hyperlipidemia: Secondary | ICD-10-CM | POA: Diagnosis not present

## 2018-05-09 NOTE — Patient Instructions (Signed)
Medication Instructions:  No changes If you need a refill on your cardiac medications before your next appointment, please call your pharmacy.   Lab work: Today - cbc, lipids, bmet If you have labs (blood work) drawn today and your tests are completely normal, you will receive your results only by: Marland Kitchen MyChart Message (if you have MyChart) OR . A paper copy in the mail If you have any lab test that is abnormal or we need to change your treatment, we will call you to review the results.  Testing/Procedures: none  Follow-Up: At [facility], you and your health needs are our priority.  As part of our continuing mission to provide you with exceptional heart care, we have created designated Provider Care Teams.  These Care Teams include your primary Cardiologist (physician) and Advanced Practice Providers (APPs -  Physician Assistants and Nurse Practitioners) who all work together to provide you with the care you need, when you need it. You will need a follow up appointment in:  12 months.  Please call our office 2 months in advance to schedule this appointment.  You may see Dorris Carnes, MD or one of the following Advanced Practice Providers on your designated Care Team: Richardson Dopp, PA-C [place], [place] . Daune Perch, NP  Any Other Special Instructions Will Be Listed Below (If Applicable).

## 2018-05-09 NOTE — Progress Notes (Signed)
Cardiology Office Note   Date:  05/09/2018   ID:  Katherine Best, DOB 1933-06-13, MRN [id number]  PCP:  Tammi Sou, MD  Cardiologist:   Dorris Carnes, MD    Pt presents for f/u of CAD and SOB     History of Present Illness: Katherine Best is a 85 y.o. female with a history of mld CAD at [facility] Sept 2019 (20%prox RCA; 35% prox/midLAD; 45% mLC) and elevated R heart pressures PCWP 18 to 24   PA 37/13  RA 12 LVEDP 24  Started on Lasix She was seen by B Bhagat in November   Was taking lasix every other day because she didn't want to urinate that much   Current Meds  Medication Sig  . albuterol (PROVENTIL HFA;VENTOLIN HFA) 108 (90 Base) MCG/ACT inhaler Inhale 2 puffs into the lungs every 6 (six) hours as needed for wheezing or shortness of breath.  . ALPRAZolam (XANAX) 0.25 MG tablet Take 1 tablet (0.25 mg total) by mouth at bedtime as needed for anxiety.  Marland Kitchen aspirin EC 81 MG tablet Take 81 mg by mouth daily.  Marland Kitchen atorvastatin (LIPITOR) 20 MG tablet TAKE ONE TABLET BY MOUTH DAILY  . calcium carbonate (CALCIUM 600) 600 MG TABS tablet Take 600 mg by mouth daily.   . celecoxib (CELEBREX) 200 MG capsule Take 1 capsule (200 mg total) by mouth daily.  . diclofenac sodium (VOLTAREN) 1 % GEL Apply 2 g topically 4 (four) times daily.   Marland Kitchen dicyclomine (BENTYL) 10 MG capsule Take 1 tab 2-3 times daily for diarrhea/urgency.  . DULoxetine (CYMBALTA) 60 MG capsule TAKE ONE CAPSULE BY MOUTH DAILY  . estradiol (ESTRACE) 0.5 MG tablet Take 0.5 mg by mouth at bedtime.  . ferrous sulfate 325 (65 FE) MG tablet Take 1 tablet (325 mg total) by mouth 2 (two) times daily with a meal.  . furosemide (LASIX) 20 MG tablet 1 tab po qd PRN increase in LE edema  . glucose blood test strip Use to check blood once daily  . HYDROcodone-acetaminophen (NORCO/VICODIN) 5-325 MG tablet Take 1 tablet by mouth 3 (three) times daily as needed.  . Lancets 30G MISC Use to check blood sugar once daily  . levothyroxine (SYNTHROID,  LEVOTHROID) 25 MCG tablet TAKE ONE TABLET BY MOUTH DAILY  . loratadine (CLARITIN) 10 MG tablet Take 10 mg by mouth daily as needed for allergies.  Marland Kitchen losartan-hydrochlorothiazide (HYZAAR) 50-12.5 MG tablet TAKE ONE TABLET BY MOUTH DAILY  . LYRICA 50 MG capsule TAKE ONE CAPSULE BY MOUTH TWICE DAILY  . metoprolol tartrate (LOPRESSOR) 50 MG tablet TAKE ONE TABLET BY MOUTH TWICE DAILY  . mirtazapine (REMERON) 15 MG tablet TAKE 1/2 TABLET BY MOUTH AT BEDTIME  . Multiple Vitamins-Minerals (MULTIVITAMIN ADULT PO) Take 1 tablet by mouth daily.   . Multiple Vitamins-Minerals (PRESERVISION AREDS 2 PO) Take 1 tablet by mouth daily.   Marland Kitchen omeprazole (PRILOSEC) 20 MG capsule TAKE ONE CAPSULE BY MOUTH DAILY  . ondansetron (ZOFRAN) 4 MG tablet Take 1 tablet by mouth as needed.  Marland Kitchen OVER THE COUNTER MEDICATION Take 1 drop by mouth daily.   Marland Kitchen oxybutynin (DITROPAN-XL) 10 MG 24 hr tablet Take 10 mg by mouth daily as needed (urine frequency).   . pioglitazone (ACTOS) 15 MG tablet TAKE ONE TABLET BY MOUTH DAILY  . potassium chloride SA (K-DUR,KLOR-CON) 20 MEQ tablet Take 1 tablet (20 mEq total) by mouth daily.  Marland Kitchen tiZANidine (ZANAFLEX) 4 MG tablet Take 4 mg by mouth every  8 (eight) hours as needed for muscle spasms.      Allergies:   Gabapentin and Sulfonamide derivatives   Past Medical History:  Diagnosis Date  . Abdominal pain, right lower quadrant   . Cataracts, both eyes    soon to have cataract surg as of 03/2015  . Diabetes mellitus with complication (HCC)    DPN.  NO diab retinopathy as of 06/2017 eye exam.  . Diabetic peripheral neuropathy associated with type 2 diabetes mellitus ([place])   . Diastolic dysfunction 1027  . Diverticulosis of colon    noted on colonoscopy 2008  . Dyspnea    a. 09/2011 CTA Chest: No PE, Ca2+ cors;  b. 09/2011 R&L heart Cath: Relatively nl R heart pressures, nl co/ci, nonobs cad, nl EF.  12/2015 stress test normal, echo with grd II DD--likely explains DOE.  Worsening chronic DOE  2019-->cath w/no agiograph signif CAD.  Pulm HTN/DD.  EF 65%.  Cards started lasix.  Marland Kitchen External hemorrhoid   . Fatigue    w/ ? excessive daytime somnolence; ? OSA--eval by Dr. Halford Chessman 06/2016, home sleep study ordered.  . Former smoker    30 pack-yrs, quit 2005  . Heart murmur    hx of  . Hot flashes    restarted estradiol 01/2015  . Hx of adenomatous polyp of colon 08/25/2016   07/2016 no recall  . Hyperlipemia, mixed    trigs remain elevated (200s-300s range), but LDL at goal.  . Hypertension   . Hypertensive heart disease 2019  . Hypothyroidism   . IBS (irritable bowel syndrome)    IBS-D (Dr. Carlean Purl) + ? worsened by GI side effect of metformin.  NO pancreatic insufficiency.  As per 09/2017 GI f/u, plan is to continue dicyclomine + f/u with them on prn basis  . Interstitial cystitis   . Liver function study, abnormal   . Lumbar degenerative disc disease    Lumb spondylosis & facet-mediated pain per Dr. Nelva Bush.  Has gotten repeated LB injections.  Eval by Dr. Tonita Cong 03/2017.  Spinal stenosis without neurogenic claudication.  RF neurotomy 05/2017.  Rpeat planned 01/2018--->on L and R L3 and L4 medial branch and L5 dorsal ramus nerves.  . Lumbar scoliosis    lumbosacral spondylosis w/out myelopathy (radiofrequency ablation/lesioning procedure planned for 05/2017).  . Macular degeneration    OS exudative (avastin treatments).  Advanced nonexudative OD, monitor.  AREDS  . Macular hole 07/05/2017   OD; surgically repaired 07/2017.  . Menopausal syndrome   . Morton's neuroma   . Obesity, Class II, BMI 35-39.9   . OSA on CPAP 07/2016   HST 07/27/16 >> AHI 6.2, SaO2 low 79%. Did not tolerate CPAP.  Pt to try again as of 12/2017.  Compliant with CPAP as of 02/2018 pulm f/u.  . Osteoarthritis of both hips 2019   surgery NOT recommended ([facility])  . Other specified gastritis without mention of hemorrhage    hx of antral gastritis on EGD 05/2009  . Peripheral neuropathy    ? DPN?--per EMG 11/2012.   Intolerant of gabapentin, failed pamelor.  Radiofrequency neurotomy 12/11/14 helped LB pain (Dr. Nelva Bush)  . Pneumonia   . RLS (restless legs syndrome)   . Tarsal tunnel syndrome   . Trochanteric bursitis of both hips 2019   Injections: [facility]  . Unspecified venous (peripheral) insufficiency     Past Surgical History:  Procedure Laterality Date  . ANKLE SURGERY     bilateral  . CARDIAC CATHETERIZATION  09/2011;11/2017   2013:  Nonobstructive CAD. 2019: mild pulmonary hypertension. LV end diastolic pressure is moderately elevated.  EF >65%.  DD/hypertensive heart dz-->BP control and add ASA 81mg  + lasix qd.  . CARDIOVASCULAR STRESS TEST  01/04/2016   Normal stress nuclear study with no ischemia or infarction; EF 82 with normal wall motion  . CATARACT EXTRACTION    . COLONOSCOPY  2002/2005, 11/14/06, and 05/2009   polyps 2002 but none 2005 or 2008 or 2011.  Repeat 08/17/16 for diarrhea: one diminutive adenoma + small polyps removed, diverticulosis noted--no further colonoscopies needed (Dr. Carlean Purl).  . CT angiogram chest  11/05/2017   NEG for PE.  Marland Kitchen DEXA  10/2016   NORMAL--consider repeat 2 yrs.  . ESOPHAGOGASTRODUODENOSCOPY  05/2009   Moderate antral gastritis  . FOOT SURGERY     R foot bunionectomy and arthroplasty of digits R foot.  Arthroplasty digits 3 and 4 L foot.  Marland Kitchen HAMMER TOE SURGERY     bilateral  . left tarsal tunnel release     sept '11 (Dr Beola Cord)  . PARTIAL KNEE ARTHROPLASTY Right 11/06/2016   Procedure: UNICOMPARTMENTAL RIGHT KNEE- Medially;  Surgeon: Paralee Cancel, MD;  Location: [facility];  Service: Orthopedics;  Laterality: Right;  90 mins  . PFTs  12/05/2017   no obstruction, mild restriction. No reversibility. Moderate DLCO defect --continue prn albut  . radiofrequency neurotomy  06/18/2015   bilat L3-4 medial branch nerve and bilat L5 dorsal ramus nerve.(Dr. Nelva Bush)  . RIGHT/LEFT HEART CATH AND CORONARY ANGIOGRAPHY N/A 12/07/2017   Procedure: RIGHT/LEFT HEART CATH AND  CORONARY ANGIOGRAPHY;  Surgeon: Leonie Man, MD;  Location: [facility];  Service: Cardiovascular;  Laterality: N/A;  . TONSILLECTOMY    . TOTAL ABDOMINAL HYSTERECTOMY W/ BILATERAL SALPINGOOPHORECTOMY    . TRANSTHORACIC ECHOCARDIOGRAM  08/2011; 2017   2013 Grade I DD; 2017 EF 60-65%, normal wall motion, grd II DD.     Social History:  The patient  reports that she quit smoking about 16 years ago. Her smoking use included cigarettes. She has a 35.00 pack-year smoking history. She has never used smokeless tobacco. She reports that she does not drink alcohol or use drugs.   Family History:  The patient's family history includes Breast cancer in her mother; Cancer in her mother; Diabetes in her maternal grandfather; Hyperlipidemia in her father; Hypertension in her father; Mental illness in her brother.    ROS:  Please see the history of present illness. All other systems are reviewed and  Negative to the above problem except as noted.    PHYSICAL EXAM: VS:  BP (!) 124/58   Pulse 75   Ht 5\' 2"  (1.575 m)   Wt 203 lb 6.4 oz (92.3 kg)   SpO2 97%   BMI 37.20 kg/m   GEN: Obese 85 yo , in no acute distress  HEENT: normal  Neck: JVP is normal   No carotid bruits, or masses Cardiac: RRR; no murmurs, rubs, or gallops,Trt edema  Respiratory: Relatively clear No wheezes  No rales  GI: soft, nontender, nondistended, + BS  No hepatomegaly  MS: no deformi ty Moving all extremities   Skin: warm and dry,Some erythema to calvs   Neuro:  Strength and sensation are intact Psych: euthymic mood, full affect   EKG:  EKG is not  ordered today.   Lipid Panel    Component Value Date/Time   CHOL 154 04/17/2017 0803   TRIG 334 (H) 04/17/2017 0803   HDL 46 (L) 04/17/2017 5176  CHOLHDL 3.3 04/17/2017 0803   VLDL 56 (H) 12/27/2015 1123   LDLCALC 68 04/17/2017 0803   LDLDIRECT 76.0 01/18/2015 0826      Wt Readings from Last 3 Encounters:  05/09/18 203 lb 6.4 oz (92.3 kg)  04/24/18 200  lb 2 oz (90.8 kg)  03/11/18 196 lb (88.9 kg)      ASSESSMENT AND PLAN:  1 Acute on chronic diastolic CHF  Volume is not bad   Would check labs   Keep on same diet   Watch salt   2   CAD   Mild   Will need to Rx risks   (HTN, HL)    2  HL Continue statin  Check lipids today     4    HTN   Follow      5   Morbid obesity   COunselled on diet and wt loss  F/U in 1 year   Signed, Dorris Carnes, MD  05/09/2018 3:31 PM    [address], [place], [place]  [address] Phone: [phone]; Fax: [phone]

## 2018-05-10 DIAGNOSIS — M5416 Radiculopathy, lumbar region: Secondary | ICD-10-CM | POA: Diagnosis not present

## 2018-05-10 DIAGNOSIS — M545 Low back pain: Secondary | ICD-10-CM | POA: Diagnosis not present

## 2018-05-10 DIAGNOSIS — M5136 Other intervertebral disc degeneration, lumbar region: Secondary | ICD-10-CM | POA: Diagnosis not present

## 2018-05-10 LAB — CBC
HEMATOCRIT: 32.5 % — AB (ref 34.0–46.6)
Hemoglobin: 11.1 g/dL (ref 11.1–15.9)
MCH: 31.5 pg (ref 26.6–33.0)
MCHC: 34.2 g/dL (ref 31.5–35.7)
MCV: 92 fL (ref 79–97)
Platelets: 327 10*3/uL (ref 150–450)
RBC: 3.52 x10E6/uL — ABNORMAL LOW (ref 3.77–5.28)
RDW: 13.3 % (ref 11.7–15.4)
WBC: 8.9 10*3/uL (ref 3.4–10.8)

## 2018-05-10 LAB — BASIC METABOLIC PANEL
BUN/Creatinine Ratio: 25 (ref 12–28)
BUN: 19 mg/dL (ref 8–27)
CHLORIDE: 95 mmol/L — AB (ref 96–106)
CO2: 24 mmol/L (ref 20–29)
Calcium: 9.2 mg/dL (ref 8.7–10.3)
Creatinine, Ser: 0.76 mg/dL (ref 0.57–1.00)
GFR calc Af Amer: 84 mL/min/{1.73_m2} (ref >59)
GFR calc non Af Amer: 73 mL/min/{1.73_m2} (ref >59)
GLUCOSE: 141 mg/dL — AB (ref 65–99)
Potassium: 3.8 mmol/L (ref 3.5–5.2)
Sodium: 142 mmol/L (ref 134–144)

## 2018-05-10 LAB — LIPID PANEL
Chol/HDL Ratio: 3.3 ratio (ref 0.0–4.4)
Cholesterol, Total: 152 mg/dL (ref 100–199)
HDL: 46 mg/dL (ref >39)
LDL Calculated: 52 mg/dL (ref 0–99)
Triglycerides: 269 mg/dL — ABNORMAL HIGH (ref 0–149)
VLDL Cholesterol Cal: 54 mg/dL — ABNORMAL HIGH (ref 5–40)

## 2018-05-15 ENCOUNTER — Encounter: Payer: Self-pay | Admitting: Family Medicine

## 2018-05-16 DIAGNOSIS — M205X2 Other deformities of toe(s) (acquired), left foot: Secondary | ICD-10-CM | POA: Diagnosis not present

## 2018-05-16 DIAGNOSIS — L97529 Non-pressure chronic ulcer of other part of left foot with unspecified severity: Secondary | ICD-10-CM | POA: Diagnosis not present

## 2018-05-16 DIAGNOSIS — E1351 Other specified diabetes mellitus with diabetic peripheral angiopathy without gangrene: Secondary | ICD-10-CM | POA: Diagnosis not present

## 2018-05-19 ENCOUNTER — Encounter: Payer: Self-pay | Admitting: Family Medicine

## 2018-05-27 ENCOUNTER — Telehealth: Payer: Self-pay | Admitting: Pulmonary Disease

## 2018-05-27 DIAGNOSIS — G4733 Obstructive sleep apnea (adult) (pediatric): Secondary | ICD-10-CM

## 2018-05-27 NOTE — Telephone Encounter (Signed)
Called and spoke with patient. Patient states she feels extra dry when she wakes up and her head is hurting on the right side up in her sinuses. She thinks she needs per pressure setting turned down (pressure set at 5). She has also adjusted her humidity and she is running out of water (humidity set at 4). She was changed from the full face to the nasal pillows. Patient is unsure what needs to be done.  Dr. Halford Chessman please advise

## 2018-05-28 DIAGNOSIS — L602 Onychogryphosis: Secondary | ICD-10-CM | POA: Diagnosis not present

## 2018-05-28 DIAGNOSIS — E1351 Other specified diabetes mellitus with diabetic peripheral angiopathy without gangrene: Secondary | ICD-10-CM | POA: Diagnosis not present

## 2018-05-28 DIAGNOSIS — M205X2 Other deformities of toe(s) (acquired), left foot: Secondary | ICD-10-CM | POA: Diagnosis not present

## 2018-05-28 DIAGNOSIS — L97529 Non-pressure chronic ulcer of other part of left foot with unspecified severity: Secondary | ICD-10-CM | POA: Diagnosis not present

## 2018-05-29 NOTE — Telephone Encounter (Signed)
Please get a copy of her last 30 days CPAP download. 

## 2018-05-29 NOTE — Telephone Encounter (Signed)
VS please advise thank you.

## 2018-05-30 NOTE — Telephone Encounter (Signed)
Ok, download placed in VS to look at folder in [facility].

## 2018-05-30 NOTE — Telephone Encounter (Signed)
Called and explained that Dr.sood will review download and make changes as he see fit.

## 2018-05-30 NOTE — Telephone Encounter (Signed)
[name] from [facility] needs an order for pressure settings.  [phone]

## 2018-05-31 NOTE — Telephone Encounter (Signed)
Waiting for Dr Halford Chessman to see download.  Dr Halford Chessman to return to office next week.  Message routed to [facility] to follow up

## 2018-06-04 ENCOUNTER — Other Ambulatory Visit: Payer: Self-pay | Admitting: Family Medicine

## 2018-06-04 DIAGNOSIS — I10 Essential (primary) hypertension: Secondary | ICD-10-CM

## 2018-06-04 DIAGNOSIS — H353130 Nonexudative age-related macular degeneration, bilateral, stage unspecified: Secondary | ICD-10-CM | POA: Diagnosis not present

## 2018-06-04 DIAGNOSIS — Z961 Presence of intraocular lens: Secondary | ICD-10-CM | POA: Diagnosis not present

## 2018-06-04 DIAGNOSIS — H5213 Myopia, bilateral: Secondary | ICD-10-CM | POA: Diagnosis not present

## 2018-06-04 DIAGNOSIS — H524 Presbyopia: Secondary | ICD-10-CM | POA: Diagnosis not present

## 2018-06-04 NOTE — Telephone Encounter (Signed)
VS please advise if you were able to see the patients download. Will also route over to [facility]. Thank you.

## 2018-06-05 NOTE — Telephone Encounter (Signed)
New DME order placed with [facility] for CPAP change to 6cm H2O.  Called and spoke with Patient.  Informed her order was placed for cpap pressure change.  Understanding stated.  Nothing further at this time.

## 2018-06-05 NOTE — Telephone Encounter (Signed)
Auto CPAP 04/30/18 to 05/29/18 >> used on 29 of 30 nights with average 6 hrs 30 min.  Average AHI 0.3 with median CPAP 6 and 95 th percentile CPAP 8 cm H2O.   Please send order to change CPAP to 6 cm H2O.

## 2018-06-07 ENCOUNTER — Other Ambulatory Visit: Payer: Self-pay | Admitting: Family Medicine

## 2018-06-10 ENCOUNTER — Ambulatory Visit (INDEPENDENT_AMBULATORY_CARE_PROVIDER_SITE_OTHER): Payer: Medicare Other | Admitting: Family Medicine

## 2018-06-10 ENCOUNTER — Encounter: Payer: Self-pay | Admitting: Family Medicine

## 2018-06-10 ENCOUNTER — Other Ambulatory Visit: Payer: Self-pay

## 2018-06-10 ENCOUNTER — Encounter: Payer: Self-pay | Admitting: Neurology

## 2018-06-10 ENCOUNTER — Other Ambulatory Visit: Payer: Self-pay | Admitting: Family Medicine

## 2018-06-10 VITALS — BP 132/70 | HR 87 | Temp 97.9°F | Resp 17 | Ht 62.0 in | Wt 206.1 lb

## 2018-06-10 DIAGNOSIS — N6489 Other specified disorders of breast: Secondary | ICD-10-CM

## 2018-06-10 DIAGNOSIS — G20 Parkinson's disease: Secondary | ICD-10-CM | POA: Diagnosis not present

## 2018-06-10 DIAGNOSIS — I2510 Atherosclerotic heart disease of native coronary artery without angina pectoris: Secondary | ICD-10-CM | POA: Diagnosis not present

## 2018-06-10 DIAGNOSIS — S2000XA Contusion of breast, unspecified breast, initial encounter: Secondary | ICD-10-CM

## 2018-06-10 NOTE — Progress Notes (Signed)
OFFICE VISIT  06/10/2018   CC:  Chief Complaint  Patient presents with  . Bleeding/Bruising    Pt has bruising that came up about two months ago on L breast. Was in the shower this AM and bruise has changed colors and is slightly larger. Pt denies fall, injury, and pain. Last mammo 10/31/2017 NEG      HPI:    Patient is a 85 y.o. Caucasian female who presents for complaint about her left breast.  She noted something on left breast that looked like a bruise about 2 mo ago.  Getting darker the last 2 days. No pain, no itch.  No lump is palpable.  No nipple d/c.  No trauma recalled. Most recent mammogram 10/31/2017--neg.  She also continues to have trouble with slow and unsteady gait. Has left >>>right hand tremor.  Says she is unable to roll over in bed.  She can open a jar lid if she uses a gripper. Her writing has become very small and messy.  Walks with small steps.   Past Medical History:  Diagnosis Date  . Abdominal pain, right lower quadrant   . Cataracts, both eyes    soon to have cataract surg as of 03/2015  . Diabetes mellitus with complication (HCC)    DPN.  NO diab retinopathy as of 06/2017 eye exam.  . Diabetic peripheral neuropathy associated with type 2 diabetes mellitus ([place])   . Diastolic dysfunction 3295  . Diverticulosis of colon    noted on colonoscopy 2008  . Dyspnea    a. 09/2011 CTA Chest: No PE, Ca2+ cors;  b. 09/2011 R&L heart Cath: Relatively nl R heart pressures, nl co/ci, nonobs cad, nl EF.  12/2015 stress test normal, echo with grd II DD--likely explains DOE.  Worsening chronic DOE 2019-->cath w/no agiograph signif CAD.  Pulm HTN/DD.  EF 65%.  Cards started lasix.  Marland Kitchen External hemorrhoid   . Fatigue    w/ ? excessive daytime somnolence; ? OSA--eval by Dr. Halford Chessman 06/2016, home sleep study ordered.  . Former smoker    30 pack-yrs, quit 2005  . Heart murmur    hx of  . Hot flashes    restarted estradiol 01/2015  . Hx of adenomatous polyp of colon 08/25/2016   07/2016 no recall  . Hyperlipemia, mixed    trigs remain elevated (200s-300s range), but LDL at goal.  . Hypertension   . Hypertensive heart disease 2019  . Hypothyroidism   . IBS (irritable bowel syndrome)    IBS-D (Dr. Carlean Purl) + ? worsened by GI side effect of metformin.  NO pancreatic insufficiency.  As per 09/2017 GI f/u, plan is to continue dicyclomine + f/u with them on prn basis  . Interstitial cystitis   . Liver function study, abnormal   . Lumbar degenerative disc disease    Lumb spondylosis & facet-mediated pain per Dr. Nelva Bush.  Has gotten repeated LB injections.  Eval by Dr. Tonita Cong 03/2017.  Spinal stenosis without neurogenic claudication.  RF neurotomy 05/2017 and 01/2018->on L and R L3 and L4 medial branch and L5 dorsal ramus nerves.  . Lumbar scoliosis    lumbosacral spondylosis w/out myelopathy (radiofrequency ablation/lesioning procedure planned for 05/2017).  . Macular degeneration    OS exudative (avastin treatments).  Advanced nonexudative OD, monitor.  AREDS  . Macular hole 07/05/2017   OD; surgically repaired 07/2017.  . Menopausal syndrome   . Morton's neuroma   . Obesity, Class II, BMI 35-39.9   . OSA on CPAP  07/2016   HST 07/27/16 >> AHI 6.2, SaO2 low 79%. Did not tolerate CPAP.  Pt to try again as of 12/2017.  Compliant with CPAP as of 02/2018 pulm f/u.  . Osteoarthritis of both hips 2019   surgery NOT recommended ([facility])  . Other specified gastritis without mention of hemorrhage    hx of antral gastritis on EGD 05/2009  . Peripheral neuropathy    ? DPN?--per EMG 11/2012.  Intolerant of gabapentin, failed pamelor.  Radiofrequency neurotomy 12/11/14 helped LB pain (Dr. Nelva Bush)  . Pneumonia   . RLS (restless legs syndrome)   . Tarsal tunnel syndrome   . Trochanteric bursitis of both hips 2019   Injections: [facility]  . Unspecified venous (peripheral) insufficiency     Past Surgical History:  Procedure Laterality Date  . ANKLE SURGERY     bilateral  .  CARDIAC CATHETERIZATION  09/2011;11/2017   2013: Nonobstructive CAD. 2019: mild pulmonary hypertension. LV end diastolic pressure is moderately elevated.  EF >65%.  DD/hypertensive heart dz-->BP control and add ASA 81mg  + lasix qd.  . CARDIOVASCULAR STRESS TEST  01/04/2016   Normal stress nuclear study with no ischemia or infarction; EF 82 with normal wall motion  . CATARACT EXTRACTION    . COLONOSCOPY  2002/2005, 11/14/06, and 05/2009   polyps 2002 but none 2005 or 2008 or 2011.  Repeat 08/17/16 for diarrhea: one diminutive adenoma + small polyps removed, diverticulosis noted--no further colonoscopies needed (Dr. Carlean Purl).  . CT angiogram chest  11/05/2017   NEG for PE.  Marland Kitchen DEXA  10/2016   NORMAL--consider repeat 2 yrs.  . ESOPHAGOGASTRODUODENOSCOPY  05/2009   Moderate antral gastritis  . FOOT SURGERY     R foot bunionectomy and arthroplasty of digits R foot.  Arthroplasty digits 3 and 4 L foot.  Marland Kitchen HAMMER TOE SURGERY     bilateral  . left tarsal tunnel release     sept '11 (Dr Beola Cord)  . PARTIAL KNEE ARTHROPLASTY Right 11/06/2016   Procedure: UNICOMPARTMENTAL RIGHT KNEE- Medially;  Surgeon: Paralee Cancel, MD;  Location: [facility];  Service: Orthopedics;  Laterality: Right;  90 mins  . PFTs  12/05/2017   no obstruction, mild restriction. No reversibility. Moderate DLCO defect --continue prn albut  . radiofrequency neurotomy  06/18/2015   bilat L3-4 medial branch nerve and bilat L5 dorsal ramus nerve.(Dr. Nelva Bush)  . RIGHT/LEFT HEART CATH AND CORONARY ANGIOGRAPHY N/A 12/07/2017   Procedure: RIGHT/LEFT HEART CATH AND CORONARY ANGIOGRAPHY;  Surgeon: Leonie Man, MD;  Location: [facility];  Service: Cardiovascular;  Laterality: N/A;  . TONSILLECTOMY    . TOTAL ABDOMINAL HYSTERECTOMY W/ BILATERAL SALPINGOOPHORECTOMY    . TRANSTHORACIC ECHOCARDIOGRAM  08/2011; 2017   2013 Grade I DD; 2017 EF 60-65%, normal wall motion, grd II DD.    Outpatient Medications Prior to Visit  Medication Sig  Dispense Refill  . albuterol (PROVENTIL HFA;VENTOLIN HFA) 108 (90 Base) MCG/ACT inhaler Inhale 2 puffs into the lungs every 6 (six) hours as needed for wheezing or shortness of breath. 1 Inhaler 1  . ALPRAZolam (XANAX) 0.25 MG tablet Take 1 tablet (0.25 mg total) by mouth at bedtime as needed for anxiety. 30 tablet 1  . aspirin EC 81 MG tablet Take 81 mg by mouth daily.    Marland Kitchen atorvastatin (LIPITOR) 20 MG tablet TAKE ONE TABLET BY MOUTH DAILY 90 tablet 1  . calcium carbonate (CALCIUM 600) 600 MG TABS tablet Take 600 mg by mouth daily.     Marland Kitchen  celecoxib (CELEBREX) 200 MG capsule Take 1 capsule (200 mg total) by mouth daily. 90 capsule 1  . diclofenac sodium (VOLTAREN) 1 % GEL Apply 2 g topically 4 (four) times daily.   1  . dicyclomine (BENTYL) 10 MG capsule Take 1 tab 2-3 times daily for diarrhea/urgency. 90 capsule 6  . DULoxetine (CYMBALTA) 60 MG capsule TAKE ONE CAPSULE BY MOUTH DAILY 90 capsule 1  . estradiol (ESTRACE) 0.5 MG tablet Take 0.5 mg by mouth at bedtime.    . ferrous sulfate 325 (65 FE) MG tablet Take 1 tablet (325 mg total) by mouth 2 (two) times daily with a meal. 30 tablet 0  . furosemide (LASIX) 20 MG tablet 1 tab po qd PRN increase in LE edema 45 tablet 3  . glucose blood test strip Use to check blood once daily 100 each 11  . HYDROcodone-acetaminophen (NORCO/VICODIN) 5-325 MG tablet Take 1 tablet by mouth 3 (three) times daily as needed.  0  . Lancets 30G MISC Use to check blood sugar once daily 100 each 11  . levothyroxine (SYNTHROID, LEVOTHROID) 25 MCG tablet TAKE ONE TABLET BY MOUTH DAILY 90 tablet 1  . loratadine (CLARITIN) 10 MG tablet Take 10 mg by mouth daily as needed for allergies.    Marland Kitchen losartan-hydrochlorothiazide (HYZAAR) 50-12.5 MG tablet TAKE ONE TABLET BY MOUTH DAILY 90 tablet 1  . LYRICA 50 MG capsule TAKE ONE CAPSULE BY MOUTH TWICE DAILY 180 capsule 1  . metoprolol tartrate (LOPRESSOR) 50 MG tablet TAKE ONE TABLET BY MOUTH TWICE DAILY 180 tablet 1  . mirtazapine  (REMERON) 15 MG tablet TAKE 1/2 TABLET BY MOUTH AT BEDTIME 45 tablet 3  . Multiple Vitamins-Minerals (MULTIVITAMIN ADULT PO) Take 1 tablet by mouth daily.     . Multiple Vitamins-Minerals (PRESERVISION AREDS 2 PO) Take 1 tablet by mouth daily.     Marland Kitchen omeprazole (PRILOSEC) 20 MG capsule TAKE ONE CAPSULE BY MOUTH DAILY 90 capsule 1  . ondansetron (ZOFRAN) 4 MG tablet Take 1 tablet by mouth as needed.    Marland Kitchen OVER THE COUNTER MEDICATION Take 1 drop by mouth daily.     Marland Kitchen oxybutynin (DITROPAN-XL) 10 MG 24 hr tablet Take 10 mg by mouth daily as needed (urine frequency).     . pioglitazone (ACTOS) 15 MG tablet TAKE ONE TABLET BY MOUTH DAILY 90 tablet 1  . potassium chloride SA (K-DUR,KLOR-CON) 20 MEQ tablet Take 1 tablet (20 mEq total) by mouth daily. 90 tablet 3  . tiZANidine (ZANAFLEX) 4 MG tablet Take 4 mg by mouth every 8 (eight) hours as needed for muscle spasms.   1   No facility-administered medications prior to visit.     Allergies  Allergen Reactions  . Gabapentin Itching  . Sulfonamide Derivatives Nausea Only    REACTION: Nausea    ROS As per HPI  PE: Blood pressure 132/70, pulse 87, temperature 97.9 F (36.6 C), temperature source Oral, resp. rate 17, height 5\' 2"  (1.575 m), weight 206 lb 2 oz (93.5 kg), SpO2 97 %.  Pt examined with Helayne Seminole, CMA, as chaperone.  Gen: Alert, well appearing.  Patient is oriented to person, place, time, and situation. AFFECT: pleasant, lucid thought and speech. Left breast: upper, inner quadrant with quarter-sized ecchymosis.  Some mixed brown, light yellow, and violaceous color.  No nodule, mass, induration, or tenderness. Musculoskeletal/neuro: question of mild cogwheel rigidity.  She pushes up with both hands on chair rails once to get to standing.  She walks a bit  stooped over using short strides that are not quite shuffles.  She does a 3 point turn. Left hand tremor noted with arms at rest and when stretched out in front of her.  Range of  facial expressions normal.  LABS:  none  IMPRESSION AND PLAN:  1) Small bruise, left breast.  Reassured pt.  2) Parkinsonism-->question of. Referred to neurology today.  An After Visit Summary was printed and given to the patient.  FOLLOW UP: Return for keep appt set for 07/25/2018.  Signed:  Crissie Sickles, MD           06/10/2018

## 2018-06-17 ENCOUNTER — Ambulatory Visit: Payer: Medicare Other | Admitting: Neurology

## 2018-06-18 ENCOUNTER — Telehealth: Payer: Self-pay | Admitting: Pulmonary Disease

## 2018-06-18 DIAGNOSIS — G4733 Obstructive sleep apnea (adult) (pediatric): Secondary | ICD-10-CM

## 2018-06-18 NOTE — Telephone Encounter (Signed)
3:14 PM  Chesley Mires, MD routed this conversation to [facility] Krystal Clark, Elisabeth Cara, MD       3:14 PM  Note    Auto CPAP 04/30/18 to 05/29/18 >> used on 29 of 30 nights with average 6 hrs 30 min.  Average AHI 0.3 with median CPAP 6 and 95 th percentile CPAP 8 cm H2O.   Please send order to change CPAP to 6 cm H2O.    June 04, 2018  _______________________________________________  Called patient, she stated that the pressure on her CPAP machine is now too weak for her. She feels like she is not getting enough air. Patient would like the pressure to be turned up. Please advise, thank you.

## 2018-06-18 NOTE — Telephone Encounter (Signed)
Please change CPAP to 7 cm H2O.

## 2018-06-18 NOTE — Telephone Encounter (Signed)
Spoke with pt. She is aware of Dr. Juanetta Gosling response. Order has been placed for pressure change. Nothing further was needed.

## 2018-07-01 ENCOUNTER — Other Ambulatory Visit: Payer: Self-pay | Admitting: Family Medicine

## 2018-07-01 NOTE — Telephone Encounter (Signed)
Patient requesting rf of Lyrica.   Last OV 06/10/2018 Next OV 07/25/2018  Last RX dated 11/19/2017 # 180 X 1 RF.  Please advise.

## 2018-07-02 DIAGNOSIS — H353222 Exudative age-related macular degeneration, left eye, with inactive choroidal neovascularization: Secondary | ICD-10-CM | POA: Diagnosis not present

## 2018-07-02 DIAGNOSIS — H35372 Puckering of macula, left eye: Secondary | ICD-10-CM | POA: Diagnosis not present

## 2018-07-02 DIAGNOSIS — H353113 Nonexudative age-related macular degeneration, right eye, advanced atrophic without subfoveal involvement: Secondary | ICD-10-CM | POA: Diagnosis not present

## 2018-07-02 DIAGNOSIS — H43812 Vitreous degeneration, left eye: Secondary | ICD-10-CM | POA: Diagnosis not present

## 2018-07-07 ENCOUNTER — Encounter: Payer: Self-pay | Admitting: Family Medicine

## 2018-07-22 ENCOUNTER — Ambulatory Visit: Payer: Medicare Other | Admitting: Neurology

## 2018-07-25 ENCOUNTER — Ambulatory Visit: Payer: Medicare Other | Admitting: Family Medicine

## 2018-07-25 ENCOUNTER — Ambulatory Visit (INDEPENDENT_AMBULATORY_CARE_PROVIDER_SITE_OTHER): Payer: Medicare Other | Admitting: Family Medicine

## 2018-07-25 ENCOUNTER — Encounter: Payer: Self-pay | Admitting: Family Medicine

## 2018-07-25 ENCOUNTER — Other Ambulatory Visit: Payer: Self-pay

## 2018-07-25 VITALS — BP 150/79 | Temp 96.0°F | Wt 200.0 lb

## 2018-07-25 DIAGNOSIS — I5032 Chronic diastolic (congestive) heart failure: Secondary | ICD-10-CM | POA: Diagnosis not present

## 2018-07-25 DIAGNOSIS — I10 Essential (primary) hypertension: Secondary | ICD-10-CM | POA: Diagnosis not present

## 2018-07-25 DIAGNOSIS — I2510 Atherosclerotic heart disease of native coronary artery without angina pectoris: Secondary | ICD-10-CM

## 2018-07-25 DIAGNOSIS — E118 Type 2 diabetes mellitus with unspecified complications: Secondary | ICD-10-CM

## 2018-07-25 DIAGNOSIS — F411 Generalized anxiety disorder: Secondary | ICD-10-CM

## 2018-07-25 MED ORDER — PIOGLITAZONE HCL 30 MG PO TABS: 30.0000 mg | ORAL_TABLET | Freq: Every day | ORAL | 1 refills | Status: DC

## 2018-07-25 NOTE — Progress Notes (Signed)
Virtual Visit via Video Note  I connected with pt on 07/25/18 at  9:20 AM EDT by a video enabled telemedicine application and verified that I am speaking with the correct person using two identifiers.  Location patient: home Location provider:work or home office Persons participating in the virtual visit: patient, provider  I discussed the limitations of evaluation and management by telemedicine and the availability of in person appointments. The patient expressed understanding and agreed to proceed.  Telemedicine visit is a necessity given the COVID-19 restrictions in place at the current time.  HPI: 85 y/o WF here for 3 mo f/u DM 2, HTN, and chronic anxiety.  DM 2: glucoses 160-170 since getting off the metformin about 3 mo ago.  Taking pioglit 15mg  qd.  No significant swelling/bloating lately, has not had to use lasix any lately. I took her off metformin altogether 3 mo ago to see if this helped her diarrhea and she says it DID.  HTN: home bp's consistently <130/80, no probs with meds.  Anxiety: having good days and bad days.  Only taking alprazolam avg of twice a week, always at night.  Taking duloxetine qd as rx'd.  Chronic LBP: this is by far her biggest chronic medical issue: DDD/spondylosis + scoliosis. Vicodin is the only thing helping, gets this med via Dr. Nelva Bush.  ROS: walking is limited by her back pain, not SOB/DOE,  Has only occasional cough, most of the time this is stimulated by using her neb machine hs. Also feels like steps are shorter, more of a shuffle. no CP, no dizziness, no HAs, no rashes, no melena/hematochezia.  No polyuria or polydipsia.   Past Medical History:  Diagnosis Date  . Abdominal pain, right lower quadrant   . Cataracts, both eyes    soon to have cataract surg as of 03/2015  . Chronic diastolic heart failure ([place]) 2019  . Diabetes mellitus with complication (HCC)    DPN.  NO diab retinopathy as of 06/2017 eye exam.  . Diabetic peripheral  neuropathy associated with type 2 diabetes mellitus ([place])   . Diverticulosis of colon    noted on colonoscopy 2008  . Dyspnea    a. 09/2011 CTA Chest: No PE, Ca2+ cors;  b. 09/2011 R&L heart Cath: Relatively nl R heart pressures, nl co/ci, nonobs cad, nl EF.  12/2015 stress test normal, echo with grd II DD--likely explains DOE.  Worsening chronic DOE 2019-->cath w/no agiograph signif CAD.  Pulm HTN/DD.  EF 65%.  Cards started lasix.  Marland Kitchen External hemorrhoid   . Fatigue    w/ ? excessive daytime somnolence; ? OSA--eval by Dr. Halford Chessman 06/2016, home sleep study ordered.  . Former smoker    30 pack-yrs, quit 2005  . Heart murmur    hx of  . Hx of adenomatous polyp of colon 08/25/2016   07/2016 no recall  . Hyperlipemia, mixed    trigs remain elevated (200s-300s range), but LDL at goal.  . Hypertension   . Hypertensive heart disease 2019  . Hypothyroidism   . IBS (irritable bowel syndrome)    IBS-D (Dr. Carlean Purl) + ? worsened by GI side effect of metformin.  NO pancreatic insufficiency.  As per 09/2017 GI f/u, plan is to continue dicyclomine + f/u with them on prn basis  . Interstitial cystitis   . Liver function study, abnormal   . Lumbar degenerative disc disease    Lumb spondylosis & facet-mediated pain per Dr. Nelva Bush.  Has gotten repeated LB injections.  Eval by  Dr. Tonita Cong 03/2017.  Spinal stenosis without neurogenic claudication.  RF neurotomy 05/2017 and 01/2018->on L and R L3 and L4 medial branch and L5 dorsal ramus nerves.  . Lumbar scoliosis    lumbosacral spondylosis w/out myelopathy (radiofrequency ablation/lesioning procedure planned for 05/2017).  . Macular degeneration    OS exudative (avastin treatments).  Advanced nonexudative OD, monitor.  AREDS  . Macular hole 07/05/2017   OD; surgically repaired 07/2017.  . Menopausal syndrome    restarted estradiol 2016  . Morton's neuroma   . Obesity, Class II, BMI 35-39.9   . OSA on CPAP 07/2016   HST 07/27/16 >> AHI 6.2, SaO2 low 79%. Did not  tolerate CPAP.  Pt to try again as of 12/2017.  Compliant with CPAP as of 02/2018 pulm f/u.  . Osteoarthritis of both hips 2019   surgery NOT recommended ([facility])  . Other specified gastritis without mention of hemorrhage    hx of antral gastritis on EGD 05/2009  . Peripheral neuropathy    ? DPN?--per EMG 11/2012.  Intolerant of gabapentin, failed pamelor.  Radiofrequency neurotomy 12/11/14 helped LB pain (Dr. Nelva Bush)  . Pneumonia   . RLS (restless legs syndrome)   . Tarsal tunnel syndrome   . Trochanteric bursitis of both hips 2019   Injections: [facility]  . Unspecified venous (peripheral) insufficiency     Past Surgical History:  Procedure Laterality Date  . ANKLE SURGERY     bilateral  . CARDIAC CATHETERIZATION  09/2011;11/2017   2013: Nonobstructive CAD. 2019: mild pulmonary hypertension. LV end diastolic pressure is moderately elevated.  EF >65%.  DD/hypertensive heart dz-->BP control and add ASA 81mg  + lasix qd.  . CARDIOVASCULAR STRESS TEST  01/04/2016   Normal stress nuclear study with no ischemia or infarction; EF 82 with normal wall motion  . CATARACT EXTRACTION    . COLONOSCOPY  2002/2005, 11/14/06, and 05/2009   polyps 2002 but none 2005 or 2008 or 2011.  Repeat 08/17/16 for diarrhea: one diminutive adenoma + small polyps removed, diverticulosis noted--no further colonoscopies needed (Dr. Carlean Purl).  . CT angiogram chest  11/05/2017   NEG for PE.  Marland Kitchen DEXA  10/2016   NORMAL--consider repeat 2 yrs.  . ESOPHAGOGASTRODUODENOSCOPY  05/2009   Moderate antral gastritis  . FOOT SURGERY     R foot bunionectomy and arthroplasty of digits R foot.  Arthroplasty digits 3 and 4 L foot.  Marland Kitchen HAMMER TOE SURGERY     bilateral  . left tarsal tunnel release     sept '11 (Dr Beola Cord)  . PARTIAL KNEE ARTHROPLASTY Right 11/06/2016   Procedure: UNICOMPARTMENTAL RIGHT KNEE- Medially;  Surgeon: Paralee Cancel, MD;  Location: [facility];  Service: Orthopedics;  Laterality: Right;  90 mins  . PFTs   12/05/2017   no obstruction, mild restriction. No reversibility. Moderate DLCO defect --continue prn albut  . radiofrequency neurotomy  06/18/2015   bilat L3-4 medial branch nerve and bilat L5 dorsal ramus nerve.(Dr. Nelva Bush)  . RIGHT/LEFT HEART CATH AND CORONARY ANGIOGRAPHY N/A 12/07/2017   Procedure: RIGHT/LEFT HEART CATH AND CORONARY ANGIOGRAPHY;  Surgeon: Leonie Man, MD;  Location: [facility];  Service: Cardiovascular;  Laterality: N/A;  . TONSILLECTOMY    . TOTAL ABDOMINAL HYSTERECTOMY W/ BILATERAL SALPINGOOPHORECTOMY    . TRANSTHORACIC ECHOCARDIOGRAM  08/2011; 2017   2013 Grade I DD; 2017 EF 60-65%, normal wall motion, grd II DD.    Family History  Problem Relation Age of Onset  . Cancer Mother  Breast  . Breast cancer Mother   . Hypertension Father   . Hyperlipidemia Father   . Diabetes Maternal Grandfather   . Mental illness Brother   . Colon cancer Neg Hx      Current Outpatient Medications:  .  ALPRAZolam (XANAX) 0.25 MG tablet, Take 1 tablet (0.25 mg total) by mouth at bedtime as needed for anxiety., Disp: 30 tablet, Rfl: 1 .  aspirin EC 81 MG tablet, Take 81 mg by mouth daily., Disp: , Rfl:  .  atorvastatin (LIPITOR) 20 MG tablet, TAKE ONE TABLET BY MOUTH DAILY, Disp: 90 tablet, Rfl: 1 .  calcium carbonate (CALCIUM 600) 600 MG TABS tablet, Take 600 mg by mouth daily. , Disp: , Rfl:  .  celecoxib (CELEBREX) 200 MG capsule, Take 1 capsule (200 mg total) by mouth daily., Disp: 90 capsule, Rfl: 1 .  diclofenac sodium (VOLTAREN) 1 % GEL, Apply 2 g topically 4 (four) times daily. , Disp: , Rfl: 1 .  dicyclomine (BENTYL) 10 MG capsule, Take 1 tab 2-3 times daily for diarrhea/urgency., Disp: 90 capsule, Rfl: 6 .  DULoxetine (CYMBALTA) 60 MG capsule, TAKE ONE CAPSULE BY MOUTH DAILY, Disp: 90 capsule, Rfl: 1 .  estradiol (ESTRACE) 0.5 MG tablet, Take 0.5 mg by mouth at bedtime., Disp: , Rfl:  .  ferrous sulfate 325 (65 FE) MG tablet, Take 1 tablet (325 mg total) by  mouth 2 (two) times daily with a meal., Disp: 30 tablet, Rfl: 0 .  furosemide (LASIX) 20 MG tablet, 1 tab po qd PRN increase in LE edema, Disp: 45 tablet, Rfl: 3 .  glucose blood test strip, Use to check blood once daily, Disp: 100 each, Rfl: 11 .  glucose blood test strip, OneTouch Verio test strips  USE TO CHECK BLOOD ONCE DAILY, Disp: , Rfl:  .  HYDROcodone-acetaminophen (NORCO/VICODIN) 5-325 MG tablet, Take 1 tablet by mouth 3 (three) times daily as needed., Disp: , Rfl: 0 .  Lancets 30G MISC, Use to check blood sugar once daily, Disp: 100 each, Rfl: 11 .  levothyroxine (SYNTHROID, LEVOTHROID) 25 MCG tablet, TAKE ONE TABLET BY MOUTH DAILY, Disp: 90 tablet, Rfl: 1 .  loratadine (CLARITIN) 10 MG tablet, Take 10 mg by mouth daily as needed for allergies., Disp: , Rfl:  .  losartan-hydrochlorothiazide (HYZAAR) 50-12.5 MG tablet, TAKE ONE TABLET BY MOUTH DAILY, Disp: 90 tablet, Rfl: 1 .  metoprolol tartrate (LOPRESSOR) 50 MG tablet, TAKE ONE TABLET BY MOUTH TWICE DAILY, Disp: 180 tablet, Rfl: 1 .  mirtazapine (REMERON) 15 MG tablet, TAKE 1/2 TABLET BY MOUTH AT BEDTIME, Disp: 45 tablet, Rfl: 3 .  Multiple Vitamins-Minerals (MULTIVITAMIN ADULT PO), Take 1 tablet by mouth daily. , Disp: , Rfl:  .  Multiple Vitamins-Minerals (PRESERVISION AREDS 2 PO), Take 1 tablet by mouth daily. , Disp: , Rfl:  .  omeprazole (PRILOSEC) 20 MG capsule, TAKE ONE CAPSULE BY MOUTH DAILY, Disp: 90 capsule, Rfl: 1 .  ondansetron (ZOFRAN) 4 MG tablet, Take 1 tablet by mouth as needed., Disp: , Rfl:  .  OVER THE COUNTER MEDICATION, Take 1 drop by mouth daily. , Disp: , Rfl:  .  oxybutynin (DITROPAN-XL) 10 MG 24 hr tablet, Take 10 mg by mouth daily as needed (urine frequency). , Disp: , Rfl:  .  pioglitazone (ACTOS) 15 MG tablet, TAKE ONE TABLET BY MOUTH DAILY, Disp: 90 tablet, Rfl: 1 .  potassium chloride SA (K-DUR,KLOR-CON) 20 MEQ tablet, Take 1 tablet (20 mEq total) by mouth daily., Disp:  90 tablet, Rfl: 3 .  pregabalin  (LYRICA) 50 MG capsule, TAKE ONE CAPSULE BY MOUTH TWICE DAILY, Disp: 180 capsule, Rfl: 1 .  tiZANidine (ZANAFLEX) 4 MG tablet, Take 4 mg by mouth every 8 (eight) hours as needed for muscle spasms. , Disp: , Rfl: 1 .  albuterol (PROVENTIL HFA;VENTOLIN HFA) 108 (90 Base) MCG/ACT inhaler, Inhale 2 puffs into the lungs every 6 (six) hours as needed for wheezing or shortness of breath. (Patient not taking: Reported on 07/25/2018), Disp: 1 Inhaler, Rfl: 1  EXAM:  VITALS per patient if applicable: BP (!) 329/51 (BP Location: Left Arm, Patient Position: Sitting, Cuff Size: Normal)   Temp (!) 96 F (35.6 C) (Oral)   Wt 200 lb (90.7 kg)   BMI 36.58 kg/m    GENERAL: alert, oriented, appears well and in no acute distress  HEENT: atraumatic, conjunttiva clear, no obvious abnormalities on inspection of external nose and ears  NECK: normal movements of the head and neck  LUNGS: on inspection no signs of respiratory distress, breathing rate appears normal, no obvious gross SOB, gasping or wheezing  CV: no obvious cyanosis  MS: moves all visible extremities without noticeable abnormality  PSYCH/NEURO: pleasant and cooperative, no obvious depression or anxiety, speech and thought processing grossly intact  LABS: none today    Chemistry      Component Value Date/Time   NA 142 05/09/2018 1550   K 3.8 05/09/2018 1550   CL 95 (L) 05/09/2018 1550   CO2 24 05/09/2018 1550   BUN 19 05/09/2018 1550   CREATININE 0.76 05/09/2018 1550   CREATININE 0.72 04/17/2017 0803      Component Value Date/Time   CALCIUM 9.2 05/09/2018 1550   ALKPHOS 99 11/01/2017 1103   AST 16 11/01/2017 1103   ALT 21 11/01/2017 1103   BILITOT 0.4 11/01/2017 1103     Lab Results  Component Value Date   WBC 8.9 05/09/2018   HGB 11.1 05/09/2018   HCT 32.5 (L) 05/09/2018   MCV 92 05/09/2018   PLT 327 05/09/2018   Lab Results  Component Value Date   HGBA1C 6.1 (A) 04/24/2018   Lab Results  Component Value Date    CHOL 152 05/09/2018   HDL 46 05/09/2018   LDLCALC 52 05/09/2018   LDLDIRECT 76.0 01/18/2015   TRIG 269 (H) 05/09/2018   CHOLHDL 3.3 05/09/2018   Lab Results  Component Value Date   TSH 3.06 11/01/2017    ASSESSMENT AND PLAN:  Discussed the following assessment and plan:  1) DM 2, glucoses up some since d/c of metformin 3 mo ago. Will increase pioglitazone to 30mg  qd.  Therapeutic expectations and side effect profile of medication discussed today.  Patient's questions answered. Will defer A1c check to next o/v in 3 mo.  2) HTN: The current medical regimen is effective;  continue present plan and medications. BMET at next f/u visit in 3 mo.  3) GAD: The current medical regimen is effective;  continue present plan and medications. Continue remeron, dulox, and prn alprazolam. Need to get [facility] signed and in chart at next IN-OFFICE visit in 3 mo.  4) Chronic diastolic HF: doing well-->continue bp control, prn lasix.  5) Gait abnormality: multifactorial, stable (chronic LBP and chronic bilat hip pain + DPN +/- parkinsonism.  Continue pain mgmt as per Dr. Nelva Bush and she will be getting neurologist evaluation 08/2018.   I discussed the assessment and treatment plan with the patient. The patient was provided an opportunity to ask  questions and all were answered. The patient agreed with the plan and demonstrated an understanding of the instructions.   The patient was advised to call back or seek an in-person evaluation if the symptoms worsen or if the condition fails to improve as anticipated.  F/u: 3 mo RCI  Signed:  Crissie Sickles, MD           07/25/2018

## 2018-08-02 DIAGNOSIS — Z20828 Contact with and (suspected) exposure to other viral communicable diseases: Secondary | ICD-10-CM | POA: Diagnosis not present

## 2018-08-07 ENCOUNTER — Other Ambulatory Visit: Payer: Self-pay | Admitting: Family Medicine

## 2018-08-09 NOTE — Telephone Encounter (Signed)
RF request for Celebrex LOV: 07/25/18 Next ov: 10/31/18 Last written: 02/05/18 (90,1)  Please advise, thanks. Medication pending

## 2018-08-15 DIAGNOSIS — M5136 Other intervertebral disc degeneration, lumbar region: Secondary | ICD-10-CM | POA: Diagnosis not present

## 2018-08-15 DIAGNOSIS — M7061 Trochanteric bursitis, right hip: Secondary | ICD-10-CM | POA: Diagnosis not present

## 2018-08-15 DIAGNOSIS — M1612 Unilateral primary osteoarthritis, left hip: Secondary | ICD-10-CM | POA: Diagnosis not present

## 2018-08-15 DIAGNOSIS — M545 Low back pain: Secondary | ICD-10-CM | POA: Diagnosis not present

## 2018-08-15 DIAGNOSIS — M1611 Unilateral primary osteoarthritis, right hip: Secondary | ICD-10-CM | POA: Diagnosis not present

## 2018-08-15 DIAGNOSIS — M7062 Trochanteric bursitis, left hip: Secondary | ICD-10-CM | POA: Diagnosis not present

## 2018-08-18 DIAGNOSIS — M7061 Trochanteric bursitis, right hip: Secondary | ICD-10-CM | POA: Insufficient documentation

## 2018-08-20 ENCOUNTER — Other Ambulatory Visit: Payer: Self-pay | Admitting: Family Medicine

## 2018-08-22 NOTE — Progress Notes (Signed)
Katherine Best was seen today in the movement disorders clinic for neurologic consultation at the request of [name], [name], MD.  The consultation is for the evaluation of slow and unsteady gait and left greater than right hand tremor.   Specific Symptoms:  Tremor: Yes.  , x 1 year.  "I didn't pay attention to it though until Dr. [name] said something to me."  Notes when at rest and when threading a needle Family hx of similar:  Yes.  , mom had a tremor (believes she had undiagnosed PD) and maternal uncle had PD Voice: no change Sleep: uses CPAP  Vivid Dreams:  Yes.    Acting out dreams:  No. Wet Pillows: No., wears cpap Postural symptoms:  Yes.    Falls?  Yes.  , last one 6 months ago and when in middle of the night to go to BR.  Now uses walker in middle of the night.  Uses cane now in day.  Attributes balance issues to bad back and sees Dr. Nelva Bush this afternoon Bradykinesia symptoms: shuffling gait, difficulty getting out of a chair and difficulty regaining balance Loss of smell:  No. Loss of taste:  No. Urinary Incontinence:  Yes.  , wears pad during day Difficulty Swallowing:  No. Handwriting, micrographia: Yes.   Trouble with ADL's:  No. (some trouble with shoes - has long shoe horn)  Trouble buttoning clothing: No. 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 Hallucinations:  No.  visual distortions: No. N/V:  No. Lightheaded:  No.  Syncope: No. Diplopia:  Rare and thinks due macular degeneration   Neuroimaging of the brain has not previously been performed.    PREVIOUS MEDICATIONS: none to date  ALLERGIES:   Allergies  Allergen Reactions  . Gabapentin Itching  . Sulfonamide Derivatives Nausea Only    REACTION: Nausea    CURRENT MEDICATIONS:  Outpatient Encounter Medications as of 08/26/2018  Medication Sig  . albuterol (PROVENTIL HFA;VENTOLIN HFA) 108 (90 Base) MCG/ACT inhaler Inhale 2 puffs into the lungs every 6  (six) hours as needed for wheezing or shortness of breath.  . ALPRAZolam (XANAX) 0.25 MG tablet Take 1 tablet (0.25 mg total) by mouth at bedtime as needed for anxiety.  Marland Kitchen aspirin EC 81 MG tablet Take 81 mg by mouth daily.  Marland Kitchen atorvastatin (LIPITOR) 20 MG tablet TAKE ONE TABLET BY MOUTH DAILY  . calcium carbonate (CALCIUM 600) 600 MG TABS tablet Take 600 mg by mouth daily.   . celecoxib (CELEBREX) 200 MG capsule TAKE ONE CAPSULE BY MOUTH EVERY DAY  . diclofenac sodium (VOLTAREN) 1 % GEL Apply 2 g topically 4 (four) times daily.   Marland Kitchen dicyclomine (BENTYL) 10 MG capsule Take 1 tab 2-3 times daily for diarrhea/urgency.  . DULoxetine (CYMBALTA) 60 MG capsule TAKE ONE CAPSULE BY MOUTH DAILY  . estradiol (ESTRACE) 0.5 MG tablet Take 0.5 mg by mouth at bedtime.  . ferrous sulfate 325 (65 FE) MG tablet Take 1 tablet (325 mg total) by mouth 2 (two) times daily with a meal.  . furosemide (LASIX) 20 MG tablet 1 tab po qd PRN increase in LE edema  . glucose blood test strip Use to check blood once daily  . glucose blood test strip OneTouch Verio test strips  USE TO CHECK BLOOD ONCE DAILY  . HYDROcodone-acetaminophen (NORCO/VICODIN) 5-325 MG tablet Take 1 tablet by mouth 3 (three) times daily as needed.  . Lancets 30G MISC Use  to check blood sugar once daily  . levothyroxine (SYNTHROID, LEVOTHROID) 25 MCG tablet TAKE ONE TABLET BY MOUTH DAILY  . loratadine (CLARITIN) 10 MG tablet Take 10 mg by mouth daily as needed for allergies.  Marland Kitchen losartan-hydrochlorothiazide (HYZAAR) 50-12.5 MG tablet TAKE ONE TABLET BY MOUTH DAILY  . metoprolol tartrate (LOPRESSOR) 50 MG tablet TAKE ONE TABLET BY MOUTH TWICE DAILY  . mirtazapine (REMERON) 15 MG tablet TAKE 1/2 TABLET BY MOUTH AT BEDTIME  . Multiple Vitamins-Minerals (MULTIVITAMIN ADULT PO) Take 1 tablet by mouth daily.   . Multiple Vitamins-Minerals (PRESERVISION AREDS 2 PO) Take 1 tablet by mouth daily.   Marland Kitchen omeprazole (PRILOSEC) 20 MG capsule TAKE ONE CAPSULE BY MOUTH  DAILY  . ondansetron (ZOFRAN) 4 MG tablet Take 1 tablet by mouth as needed.  Marland Kitchen OVER THE COUNTER MEDICATION Take 1 drop by mouth daily.   Marland Kitchen oxybutynin (DITROPAN-XL) 10 MG 24 hr tablet Take 10 mg by mouth daily as needed (urine frequency).   . pioglitazone (ACTOS) 30 MG tablet Take 1 tablet (30 mg total) by mouth daily.  . potassium chloride SA (K-DUR,KLOR-CON) 20 MEQ tablet Take 1 tablet (20 mEq total) by mouth daily.  . pregabalin (LYRICA) 50 MG capsule TAKE ONE CAPSULE BY MOUTH TWICE DAILY  . tiZANidine (ZANAFLEX) 4 MG tablet Take 4 mg by mouth every 8 (eight) hours as needed for muscle spasms.    No facility-administered encounter medications on file as of 08/26/2018.     PAST MEDICAL HISTORY:   Past Medical History:  Diagnosis Date  . Abdominal pain, right lower quadrant   . Cataracts, both eyes    soon to have cataract surg as of 03/2015  . Chronic diastolic heart failure ([place]) 2019  . Diabetes mellitus with complication (HCC)    DPN.  NO diab retinopathy as of 06/2017 eye exam.  . Diabetic peripheral neuropathy associated with type 2 diabetes mellitus ([place])   . Diverticulosis of colon    noted on colonoscopy 2008  . Dyspnea    a. 09/2011 CTA Chest: No PE, Ca2+ cors;  b. 09/2011 R&L heart Cath: Relatively nl R heart pressures, nl co/ci, nonobs cad, nl EF.  12/2015 stress test normal, echo with grd II DD--likely explains DOE.  Worsening chronic DOE 2019-->cath w/no agiograph signif CAD.  Pulm HTN/DD.  EF 65%.  Cards started lasix.  Marland Kitchen External hemorrhoid   . Fatigue    w/ ? excessive daytime somnolence; ? OSA--eval by Dr. Halford Chessman 06/2016, home sleep study ordered.  . Former smoker    30 pack-yrs, quit 2005  . Heart murmur    hx of  . Hx of adenomatous polyp of colon 08/25/2016   07/2016 no recall  . Hyperlipemia, mixed    trigs remain elevated (200s-300s range), but LDL at goal.  . Hypertension   . Hypertensive heart disease 2019  . Hypothyroidism   . IBS (irritable bowel syndrome)     IBS-D (Dr. Carlean Purl) + ? worsened by GI side effect of metformin.  NO pancreatic insufficiency.  As per 09/2017 GI f/u, plan is to continue dicyclomine + f/u with them on prn basis  . Interstitial cystitis   . Liver function study, abnormal   . Lumbar degenerative disc disease    Lumb spondylosis & facet-mediated pain per Dr. Nelva Bush.  Has gotten repeated LB injections.  Eval by Dr. Tonita Cong 03/2017.  Spinal stenosis without neurogenic claudication.  RF neurotomy 05/2017 and 01/2018->on L and R L3 and L4 medial branch and  L5 dorsal ramus nerves.  . Lumbar scoliosis    lumbosacral spondylosis w/out myelopathy (radiofrequency ablation/lesioning procedure planned for 05/2017).  . Macular degeneration    OS exudative (avastin treatments).  Advanced nonexudative OD, monitor.  AREDS  . Macular hole 07/05/2017   OD; surgically repaired 07/2017.  . Menopausal syndrome    restarted estradiol 2016  . Morton's neuroma   . Obesity, Class II, BMI 35-39.9   . OSA on CPAP 07/2016   HST 07/27/16 >> AHI 6.2, SaO2 low 79%. Did not tolerate CPAP.  Pt to try again as of 12/2017.  Compliant with CPAP as of 02/2018 pulm f/u.  . Osteoarthritis of both hips 2019   surgery NOT recommended ([facility])  . Other specified gastritis without mention of hemorrhage    hx of antral gastritis on EGD 05/2009  . Peripheral neuropathy    ? DPN?--per EMG 11/2012.  Intolerant of gabapentin, failed pamelor.  Radiofrequency neurotomy 12/11/14 helped LB pain (Dr. Nelva Bush)  . Pneumonia   . RLS (restless legs syndrome)   . Tarsal tunnel syndrome   . Trochanteric bursitis of both hips 2019   Injections: [facility]  . Unspecified venous (peripheral) insufficiency     PAST SURGICAL HISTORY:   Past Surgical History:  Procedure Laterality Date  . ANKLE SURGERY     bilateral  . CARDIAC CATHETERIZATION  09/2011;11/2017   2013: Nonobstructive CAD. 2019: mild pulmonary hypertension. LV end diastolic pressure is moderately elevated.  EF >65%.   DD/hypertensive heart dz-->BP control and add ASA 81mg  + lasix qd.  . CARDIOVASCULAR STRESS TEST  01/04/2016   Normal stress nuclear study with no ischemia or infarction; EF 82 with normal wall motion  . CATARACT EXTRACTION    . COLONOSCOPY  2002/2005, 11/14/06, and 05/2009   polyps 2002 but none 2005 or 2008 or 2011.  Repeat 08/17/16 for diarrhea: one diminutive adenoma + small polyps removed, diverticulosis noted--no further colonoscopies needed (Dr. Carlean Purl).  . CT angiogram chest  11/05/2017   NEG for PE.  Marland Kitchen DEXA  10/2016   NORMAL--consider repeat 2 yrs.  . ESOPHAGOGASTRODUODENOSCOPY  05/2009   Moderate antral gastritis  . FOOT SURGERY     R foot bunionectomy and arthroplasty of digits R foot.  Arthroplasty digits 3 and 4 L foot.  Marland Kitchen HAMMER TOE SURGERY     bilateral  . left tarsal tunnel release     sept '11 (Dr Beola Cord)  . PARTIAL KNEE ARTHROPLASTY Right 11/06/2016   Procedure: UNICOMPARTMENTAL RIGHT KNEE- Medially;  Surgeon: Paralee Cancel, MD;  Location: [facility];  Service: Orthopedics;  Laterality: Right;  90 mins  . PFTs  12/05/2017   no obstruction, mild restriction. No reversibility. Moderate DLCO defect --continue prn albut  . radiofrequency neurotomy  06/18/2015   bilat L3-4 medial branch nerve and bilat L5 dorsal ramus nerve.(Dr. Nelva Bush)  . RIGHT/LEFT HEART CATH AND CORONARY ANGIOGRAPHY N/A 12/07/2017   Procedure: RIGHT/LEFT HEART CATH AND CORONARY ANGIOGRAPHY;  Surgeon: Leonie Man, MD;  Location: [facility];  Service: Cardiovascular;  Laterality: N/A;  . TONSILLECTOMY    . TOTAL ABDOMINAL HYSTERECTOMY W/ BILATERAL SALPINGOOPHORECTOMY    . TRANSTHORACIC ECHOCARDIOGRAM  08/2011; 2017   2013 Grade I DD; 2017 EF 60-65%, normal wall motion, grd II DD.    SOCIAL HISTORY:   Social History   Socioeconomic History  . Marital status: Married    Spouse name: Not on file  . Number of children: Not on file  . Years of education:  Not on file  . Highest education level: Not  on file  Occupational History  . Not on file  Social Needs  . Financial resource strain: Not on file  . Food insecurity:    Worry: Not on file    Inability: Not on file  . Transportation needs:    Medical: Not on file    Non-medical: Not on file  Tobacco Use  . Smoking status: Former Smoker    Packs/day: 1.00    Years: 35.00    Pack years: 35.00    Types: Cigarettes    Last attempt to quit: 03/27/2002    Years since quitting: 16.4  . Smokeless tobacco: Never Used  Substance and Sexual Activity  . Alcohol use: No    Alcohol/week: 0.0 standard drinks  . Drug use: No  . Sexual activity: Not Currently  Lifestyle  . Physical activity:    Days per week: Not on file    Minutes per session: Not on file  . Stress: Not on file  Relationships  . Social connections:    Talks on phone: Not on file    Gets together: Not on file    Attends religious service: Not on file    Active member of club or organization: Not on file    Attends meetings of clubs or organizations: Not on file    Relationship status: Not on file  . Intimate partner violence:    Fear of current or ex partner: Not on file    Emotionally abused: Not on file    Physically abused: Not on file    Forced sexual activity: Not on file  Other Topics Concern  . Not on file  Social History Narrative   Married '57   3 sons- '60, '61, '64, grandchildren 43 (7 girls, 1 boy)   Lives independently with husband   Patient is a former smoker: quit 2004.   Alcohol use- yes socially not in years.   Illicit Drug use- no   Occupation: Biomedical scientist    FAMILY HISTORY:   Family Status  Relation Name Status  . Mother  Deceased  . Father  Deceased at age 29       MI fatal  . MGF  Deceased  . PGM  Deceased  . PGF  Deceased  . MGM  Deceased  . Brother [employer]  . Son  Alive  . Brother [employer]  . Brother Annalee Genta Deceased       suicide  . Son  Alive  . Son  Alive  . Neg Hx  (Not Specified)    ROS:  Review of  Systems  Constitutional: Negative.   HENT: Negative.   Eyes: Negative.   Respiratory: Negative.   Cardiovascular: Negative.   Gastrointestinal: Negative.   Genitourinary: Negative.   Musculoskeletal: Negative.   Skin: Negative.   Neurological: Positive for sensory change (paresthesias in the fingers and toes).  Endo/Heme/Allergies: Negative.     PHYSICAL EXAMINATION:    VITALS:   Vitals:   08/26/18 1015  BP: (!) 131/55  Pulse: 64  Temp: (!) 97.4 F (36.3 C)  SpO2: 98%  Weight: 200 lb (90.7 kg)  Height: 5\' 2"  (1.575 m)    GEN:  The patient appears stated age and is in NAD. HEENT:  Normocephalic, atraumatic.  The mucous membranes are moist. The superficial temporal arteries are without ropiness or tenderness. CV:  RRR Lungs:  CTAB Neck/HEME:  There are no carotid bruits bilaterally.  Neurological  examination:  Orientation: The patient is alert and oriented x3. Fund of knowledge is appropriate.  Recent and remote memory are intact.  Attention and concentration are normal.    Able to name objects and repeat phrases. Cranial nerves: There is good facial symmetry. Pupils are equal round and reactive to light bilaterally. Fundoscopic exam reveals clear margins bilaterally. Extraocular muscles are intact. The visual fields are full to confrontational testing. The speech is fluent and clear. Soft palate rises symmetrically and there is no tongue deviation. Hearing is intact to conversational tone. Sensation: Sensation is intact to light and pinprick throughout (facial, trunk, extremities). Vibration is decreased at the bilateral big toe. There is no extinction with double simultaneous stimulation. There is no sensory dermatomal level identified. Motor: Strength is 5/5 in the bilateral upper and lower extremities.   Shoulder shrug is equal and symmetric.  There is no pronator drift. Deep tendon reflexes: Deep tendon reflexes are 2/4 at the bilateral biceps, triceps, brachioradialis,  patella and achilles. Plantar responses are downgoing bilaterally.  Movement examination: Tone: There is normal tone in the bilateral upper extremities.  The tone in the lower extremities is normal.  Abnormal movements: there is LUE rest tremor Coordination:  There is decremation with RAM's, with any form of RAMS, including alternating supination and pronation of the forearm, hand opening and closing, finger taps, heel taps and toe taps on the left. Gait and Station: The patient has mild difficulty arising out of a deep-seated chair without the use of the hands. The patient's stride length is wide based, short stepped with decreased arm swing on the left (holds cane on the right).      Chemistry      Component Value Date/Time   NA 142 05/09/2018 1550   K 3.8 05/09/2018 1550   CL 95 (L) 05/09/2018 1550   CO2 24 05/09/2018 1550   BUN 19 05/09/2018 1550   CREATININE 0.76 05/09/2018 1550   CREATININE 0.72 04/17/2017 0803      Component Value Date/Time   CALCIUM 9.2 05/09/2018 1550   ALKPHOS 99 11/01/2017 1103   AST 16 11/01/2017 1103   ALT 21 11/01/2017 1103   BILITOT 0.4 11/01/2017 1103     Lab Results  Component Value Date   TSH 3.06 11/01/2017     ASSESSMENT/PLAN:  1.  Parkinsonism.  I suspect that this does represent idiopathic Parkinson's disease.  The patient has tremor, bradykinesia, and mild postural instability.  There is a family hx of PD  -We discussed the diagnosis as well as pathophysiology of the disease.  We discussed treatment options as well as prognostic indicators.  Patient education was provided.  -We discussed that it used to be thought that levodopa would increase risk of melanoma but now it is believed that Parkinsons itself likely increases risk of melanoma. she is to get regular skin checks.  -Greater than 50% of the 60 minute visit was spent in counseling answering questions and talking about what to expect now as well as in the future.  We talked about  medication options as well as potential future surgical options.  We talked about safety in the home.  -We decided to add carbidopa/levodopa 25/100.  1/2 tab tid x 1 wk, then 1/2 in am & noon & 1 at night for a week, then 1/2 in am &1 at noon &night for a week, then 1 po tid.  Risks, benefits, side effects and alternative therapies were discussed.  The opportunity to ask questions  was given and they were answered to the best of my ability.  The patient expressed understanding and willingness to follow the outlined treatment protocols.  -pt would like to do PT in home.  She is homebound due to covid.  We will refer.  -We discussed community resources in the area including patient support groups and community exercise programs for PD and pt education was provided to the patient.   2.  Chronic LBP  -sees Dr. Nelva Bush for pain management  3.  Diabetic PN  -The patient has clinical examination evidence of a diffuse peripheral neuropathy, which certainly can affect gait and balance.  We discussed safety associated with peripheral neuropathy.  We discussed balance therapy and the importance of ambulatory assistive device for balance assistance.   4.  Follow up is anticipated in the next few months, sooner should new neurologic issues arise.     Cc:  Tammi Sou, MD

## 2018-08-26 ENCOUNTER — Encounter: Payer: Self-pay | Admitting: Neurology

## 2018-08-26 ENCOUNTER — Ambulatory Visit (INDEPENDENT_AMBULATORY_CARE_PROVIDER_SITE_OTHER): Payer: Medicare Other | Admitting: Neurology

## 2018-08-26 ENCOUNTER — Other Ambulatory Visit: Payer: Self-pay

## 2018-08-26 VITALS — BP 131/55 | HR 64 | Temp 97.4°F | Ht 62.0 in | Wt 200.0 lb

## 2018-08-26 DIAGNOSIS — M5136 Other intervertebral disc degeneration, lumbar region: Secondary | ICD-10-CM | POA: Diagnosis not present

## 2018-08-26 DIAGNOSIS — M545 Low back pain, unspecified: Secondary | ICD-10-CM

## 2018-08-26 DIAGNOSIS — I2510 Atherosclerotic heart disease of native coronary artery without angina pectoris: Secondary | ICD-10-CM

## 2018-08-26 DIAGNOSIS — G20 Parkinson's disease: Secondary | ICD-10-CM | POA: Diagnosis not present

## 2018-08-26 DIAGNOSIS — E1142 Type 2 diabetes mellitus with diabetic polyneuropathy: Secondary | ICD-10-CM

## 2018-08-26 DIAGNOSIS — G8929 Other chronic pain: Secondary | ICD-10-CM | POA: Diagnosis not present

## 2018-08-26 MED ORDER — CARBIDOPA-LEVODOPA 25-100 MG PO TABS: 1.0000 | ORAL_TABLET | Freq: Three times a day (TID) | ORAL | 1 refills | Status: DC

## 2018-08-26 NOTE — Addendum Note (Signed)
Addended by: Ranae Plumber on: 08/26/2018 11:26 AM   Modules accepted: Orders

## 2018-08-26 NOTE — Patient Instructions (Addendum)
Start Carbidopa Levodopa as follows:  Take 1/2 tablet three times daily, at least 30 minutes before meals, for one week  Then take 1/2 tablet in the morning, 1/2 tablet in the afternoon, 1 tablet in the evening, at least 30 minutes before meals, for one week  Then take 1/2 tablet in the morning, 1 tablet in the afternoon, 1 tablet in the evening, at least 30 minutes before meals, for one week  Then take 1 tablet three times daily, at least 30 minutes before meals   As a reminder, carbidopa/levodopa can be taken at the same time as a carbohydrate, but we like to have you take your pill either 30 minutes before a protein source or 1 hour after as protein can interfere with carbidopa/levodopa absorption.  

## 2018-09-05 DIAGNOSIS — I2510 Atherosclerotic heart disease of native coronary artery without angina pectoris: Secondary | ICD-10-CM | POA: Diagnosis not present

## 2018-09-05 DIAGNOSIS — G20 Parkinson's disease: Secondary | ICD-10-CM | POA: Diagnosis not present

## 2018-09-05 DIAGNOSIS — E1142 Type 2 diabetes mellitus with diabetic polyneuropathy: Secondary | ICD-10-CM | POA: Diagnosis not present

## 2018-09-05 DIAGNOSIS — I5032 Chronic diastolic (congestive) heart failure: Secondary | ICD-10-CM | POA: Diagnosis not present

## 2018-09-05 DIAGNOSIS — I110 Hypertensive heart disease with heart failure: Secondary | ICD-10-CM | POA: Diagnosis not present

## 2018-09-09 ENCOUNTER — Telehealth: Payer: Self-pay | Admitting: Family Medicine

## 2018-09-09 NOTE — Telephone Encounter (Signed)
Copied from [place] [id number]. Topic: General - Inquiry >> Sep 09, 2018 10:52 AM Oneta Rack wrote: Caller name: Leah  Relation to pt: PT from [facility]  Call back number: [phone]   Reason for call:  Requesting social worker referral to help with community resources and verbal orders for PT 2x 3 1x 5   Please advise.

## 2018-09-09 NOTE — Telephone Encounter (Signed)
Left detailed message of okay per Dr [name].

## 2018-09-09 NOTE — Telephone Encounter (Signed)
Verbal ok?

## 2018-09-10 ENCOUNTER — Encounter: Payer: Self-pay | Admitting: Family Medicine

## 2018-09-11 DIAGNOSIS — I110 Hypertensive heart disease with heart failure: Secondary | ICD-10-CM | POA: Diagnosis not present

## 2018-09-11 DIAGNOSIS — G20 Parkinson's disease: Secondary | ICD-10-CM | POA: Diagnosis not present

## 2018-09-11 DIAGNOSIS — E1142 Type 2 diabetes mellitus with diabetic polyneuropathy: Secondary | ICD-10-CM | POA: Diagnosis not present

## 2018-09-11 DIAGNOSIS — I2510 Atherosclerotic heart disease of native coronary artery without angina pectoris: Secondary | ICD-10-CM | POA: Diagnosis not present

## 2018-09-11 DIAGNOSIS — I5032 Chronic diastolic (congestive) heart failure: Secondary | ICD-10-CM | POA: Diagnosis not present

## 2018-09-11 NOTE — Telephone Encounter (Signed)
[place] Nurse [phone]  [address] twice yesterday, no injuries She is sore.

## 2018-09-12 ENCOUNTER — Ambulatory Visit (INDEPENDENT_AMBULATORY_CARE_PROVIDER_SITE_OTHER): Payer: Medicare Other

## 2018-09-12 ENCOUNTER — Other Ambulatory Visit: Payer: Self-pay

## 2018-09-12 DIAGNOSIS — R350 Frequency of micturition: Secondary | ICD-10-CM

## 2018-09-12 LAB — POCT URINALYSIS DIPSTICK
Bilirubin, UA: NEGATIVE
Blood, UA: NEGATIVE
Glucose, UA: NEGATIVE
Leukocytes, UA: NEGATIVE
Nitrite, UA: NEGATIVE
Protein, UA: POSITIVE — AB
Spec Grav, UA: 1.02 (ref 1.010–1.025)
Urobilinogen, UA: 0.2 E.U./dL
pH, UA: 6 (ref 5.0–8.0)

## 2018-09-12 NOTE — Telephone Encounter (Signed)
Pls have pt come in for lab visit to give clean catch urine specimen for dipstick and culture.-thx

## 2018-09-12 NOTE — Progress Notes (Signed)
Urine dipstick performed. Culture sent to lab.   Please advise

## 2018-09-13 ENCOUNTER — Telehealth: Payer: Self-pay | Admitting: Family Medicine

## 2018-09-13 ENCOUNTER — Other Ambulatory Visit: Payer: Self-pay | Admitting: Family Medicine

## 2018-09-13 ENCOUNTER — Telehealth: Payer: Self-pay

## 2018-09-13 DIAGNOSIS — G20 Parkinson's disease: Secondary | ICD-10-CM | POA: Diagnosis not present

## 2018-09-13 DIAGNOSIS — E1142 Type 2 diabetes mellitus with diabetic polyneuropathy: Secondary | ICD-10-CM | POA: Diagnosis not present

## 2018-09-13 DIAGNOSIS — I2510 Atherosclerotic heart disease of native coronary artery without angina pectoris: Secondary | ICD-10-CM | POA: Diagnosis not present

## 2018-09-13 DIAGNOSIS — I110 Hypertensive heart disease with heart failure: Secondary | ICD-10-CM | POA: Diagnosis not present

## 2018-09-13 DIAGNOSIS — I5032 Chronic diastolic (congestive) heart failure: Secondary | ICD-10-CM | POA: Diagnosis not present

## 2018-09-13 LAB — URINE CULTURE
MICRO NUMBER:: 583845
Result:: NO GROWTH
SPECIMEN QUALITY:: ADEQUATE

## 2018-09-13 LAB — EXTRA URINE SPECIMEN

## 2018-09-13 MED ORDER — OXYBUTYNIN CHLORIDE ER 10 MG PO TB24: 10.0000 mg | ORAL_TABLET | Freq: Every day | ORAL | 6 refills | Status: DC | PRN

## 2018-09-13 NOTE — Telephone Encounter (Signed)
My Chart message sent

## 2018-09-13 NOTE — Telephone Encounter (Signed)
Patient left a VM on front desk phone asking for call back about a medication. It sounded like patient said oxybutynin but she mispronounced it so I am not sure. Patient needs a refill.

## 2018-09-13 NOTE — Telephone Encounter (Signed)
SW pt and she just wanted to make sure the oxybutynin would be sent in since she didn't have any.

## 2018-09-16 ENCOUNTER — Telehealth: Payer: Self-pay

## 2018-09-16 ENCOUNTER — Other Ambulatory Visit: Payer: Self-pay | Admitting: Family Medicine

## 2018-09-16 DIAGNOSIS — I10 Essential (primary) hypertension: Secondary | ICD-10-CM

## 2018-09-16 DIAGNOSIS — G20 Parkinson's disease: Secondary | ICD-10-CM | POA: Diagnosis not present

## 2018-09-16 DIAGNOSIS — I5032 Chronic diastolic (congestive) heart failure: Secondary | ICD-10-CM | POA: Diagnosis not present

## 2018-09-16 DIAGNOSIS — E1142 Type 2 diabetes mellitus with diabetic polyneuropathy: Secondary | ICD-10-CM | POA: Diagnosis not present

## 2018-09-16 DIAGNOSIS — I110 Hypertensive heart disease with heart failure: Secondary | ICD-10-CM | POA: Diagnosis not present

## 2018-09-16 DIAGNOSIS — I2510 Atherosclerotic heart disease of native coronary artery without angina pectoris: Secondary | ICD-10-CM | POA: Diagnosis not present

## 2018-09-16 NOTE — Telephone Encounter (Signed)
Verbal order approved by PCP regarding request social worker referral to help with community resources and verbal orders for PT 2x 3 1x 5.  Received forms regarding orders needing PCP signature and [place] certification.  Please advise, thanks.

## 2018-09-17 DIAGNOSIS — I5032 Chronic diastolic (congestive) heart failure: Secondary | ICD-10-CM | POA: Diagnosis not present

## 2018-09-17 DIAGNOSIS — E1142 Type 2 diabetes mellitus with diabetic polyneuropathy: Secondary | ICD-10-CM | POA: Diagnosis not present

## 2018-09-17 DIAGNOSIS — I2510 Atherosclerotic heart disease of native coronary artery without angina pectoris: Secondary | ICD-10-CM | POA: Diagnosis not present

## 2018-09-17 DIAGNOSIS — G20 Parkinson's disease: Secondary | ICD-10-CM | POA: Diagnosis not present

## 2018-09-17 DIAGNOSIS — I110 Hypertensive heart disease with heart failure: Secondary | ICD-10-CM | POA: Diagnosis not present

## 2018-09-17 NOTE — Telephone Encounter (Signed)
signed

## 2018-09-18 DIAGNOSIS — I5032 Chronic diastolic (congestive) heart failure: Secondary | ICD-10-CM | POA: Diagnosis not present

## 2018-09-18 DIAGNOSIS — G20 Parkinson's disease: Secondary | ICD-10-CM | POA: Diagnosis not present

## 2018-09-18 DIAGNOSIS — I110 Hypertensive heart disease with heart failure: Secondary | ICD-10-CM | POA: Diagnosis not present

## 2018-09-18 DIAGNOSIS — I2510 Atherosclerotic heart disease of native coronary artery without angina pectoris: Secondary | ICD-10-CM | POA: Diagnosis not present

## 2018-09-18 DIAGNOSIS — E1142 Type 2 diabetes mellitus with diabetic polyneuropathy: Secondary | ICD-10-CM | POA: Diagnosis not present

## 2018-09-23 ENCOUNTER — Other Ambulatory Visit: Payer: Self-pay | Admitting: Family Medicine

## 2018-09-25 DIAGNOSIS — I2510 Atherosclerotic heart disease of native coronary artery without angina pectoris: Secondary | ICD-10-CM | POA: Diagnosis not present

## 2018-09-25 DIAGNOSIS — E1142 Type 2 diabetes mellitus with diabetic polyneuropathy: Secondary | ICD-10-CM | POA: Diagnosis not present

## 2018-09-25 DIAGNOSIS — G20 Parkinson's disease: Secondary | ICD-10-CM | POA: Diagnosis not present

## 2018-09-25 DIAGNOSIS — I110 Hypertensive heart disease with heart failure: Secondary | ICD-10-CM | POA: Diagnosis not present

## 2018-09-25 DIAGNOSIS — I5032 Chronic diastolic (congestive) heart failure: Secondary | ICD-10-CM | POA: Diagnosis not present

## 2018-09-26 DIAGNOSIS — G20 Parkinson's disease: Secondary | ICD-10-CM | POA: Diagnosis not present

## 2018-09-26 DIAGNOSIS — I5032 Chronic diastolic (congestive) heart failure: Secondary | ICD-10-CM | POA: Diagnosis not present

## 2018-09-26 DIAGNOSIS — I2510 Atherosclerotic heart disease of native coronary artery without angina pectoris: Secondary | ICD-10-CM | POA: Diagnosis not present

## 2018-09-26 DIAGNOSIS — E1142 Type 2 diabetes mellitus with diabetic polyneuropathy: Secondary | ICD-10-CM | POA: Diagnosis not present

## 2018-09-26 DIAGNOSIS — I110 Hypertensive heart disease with heart failure: Secondary | ICD-10-CM | POA: Diagnosis not present

## 2018-09-30 ENCOUNTER — Other Ambulatory Visit: Payer: Self-pay | Admitting: Family Medicine

## 2018-09-30 DIAGNOSIS — Z1231 Encounter for screening mammogram for malignant neoplasm of breast: Secondary | ICD-10-CM

## 2018-10-01 DIAGNOSIS — I5032 Chronic diastolic (congestive) heart failure: Secondary | ICD-10-CM | POA: Diagnosis not present

## 2018-10-01 DIAGNOSIS — I2510 Atherosclerotic heart disease of native coronary artery without angina pectoris: Secondary | ICD-10-CM | POA: Diagnosis not present

## 2018-10-01 DIAGNOSIS — G20 Parkinson's disease: Secondary | ICD-10-CM | POA: Diagnosis not present

## 2018-10-01 DIAGNOSIS — I110 Hypertensive heart disease with heart failure: Secondary | ICD-10-CM | POA: Diagnosis not present

## 2018-10-01 DIAGNOSIS — E1142 Type 2 diabetes mellitus with diabetic polyneuropathy: Secondary | ICD-10-CM | POA: Diagnosis not present

## 2018-10-02 DIAGNOSIS — H353113 Nonexudative age-related macular degeneration, right eye, advanced atrophic without subfoveal involvement: Secondary | ICD-10-CM | POA: Diagnosis not present

## 2018-10-02 DIAGNOSIS — H353222 Exudative age-related macular degeneration, left eye, with inactive choroidal neovascularization: Secondary | ICD-10-CM | POA: Diagnosis not present

## 2018-10-05 DIAGNOSIS — I110 Hypertensive heart disease with heart failure: Secondary | ICD-10-CM | POA: Diagnosis not present

## 2018-10-05 DIAGNOSIS — G20 Parkinson's disease: Secondary | ICD-10-CM | POA: Diagnosis not present

## 2018-10-05 DIAGNOSIS — I2510 Atherosclerotic heart disease of native coronary artery without angina pectoris: Secondary | ICD-10-CM | POA: Diagnosis not present

## 2018-10-05 DIAGNOSIS — I5032 Chronic diastolic (congestive) heart failure: Secondary | ICD-10-CM | POA: Diagnosis not present

## 2018-10-05 DIAGNOSIS — E1142 Type 2 diabetes mellitus with diabetic polyneuropathy: Secondary | ICD-10-CM | POA: Diagnosis not present

## 2018-10-07 NOTE — Progress Notes (Signed)
Agree with dipstick UA being done. Signed:  Crissie Sickles, MD           10/07/2018

## 2018-10-09 DIAGNOSIS — I2510 Atherosclerotic heart disease of native coronary artery without angina pectoris: Secondary | ICD-10-CM | POA: Diagnosis not present

## 2018-10-09 DIAGNOSIS — I5032 Chronic diastolic (congestive) heart failure: Secondary | ICD-10-CM | POA: Diagnosis not present

## 2018-10-09 DIAGNOSIS — G20 Parkinson's disease: Secondary | ICD-10-CM | POA: Diagnosis not present

## 2018-10-09 DIAGNOSIS — E1142 Type 2 diabetes mellitus with diabetic polyneuropathy: Secondary | ICD-10-CM | POA: Diagnosis not present

## 2018-10-09 DIAGNOSIS — I110 Hypertensive heart disease with heart failure: Secondary | ICD-10-CM | POA: Diagnosis not present

## 2018-10-10 DIAGNOSIS — M79672 Pain in left foot: Secondary | ICD-10-CM | POA: Diagnosis not present

## 2018-10-10 DIAGNOSIS — L84 Corns and callosities: Secondary | ICD-10-CM | POA: Diagnosis not present

## 2018-10-10 DIAGNOSIS — M205X1 Other deformities of toe(s) (acquired), right foot: Secondary | ICD-10-CM | POA: Diagnosis not present

## 2018-10-10 DIAGNOSIS — L03119 Cellulitis of unspecified part of limb: Secondary | ICD-10-CM | POA: Diagnosis not present

## 2018-10-10 DIAGNOSIS — E13621 Other specified diabetes mellitus with foot ulcer: Secondary | ICD-10-CM | POA: Diagnosis not present

## 2018-10-10 DIAGNOSIS — M79671 Pain in right foot: Secondary | ICD-10-CM | POA: Diagnosis not present

## 2018-10-16 DIAGNOSIS — I2510 Atherosclerotic heart disease of native coronary artery without angina pectoris: Secondary | ICD-10-CM | POA: Diagnosis not present

## 2018-10-16 DIAGNOSIS — I110 Hypertensive heart disease with heart failure: Secondary | ICD-10-CM | POA: Diagnosis not present

## 2018-10-16 DIAGNOSIS — E1142 Type 2 diabetes mellitus with diabetic polyneuropathy: Secondary | ICD-10-CM | POA: Diagnosis not present

## 2018-10-16 DIAGNOSIS — G20 Parkinson's disease: Secondary | ICD-10-CM | POA: Diagnosis not present

## 2018-10-16 DIAGNOSIS — I5032 Chronic diastolic (congestive) heart failure: Secondary | ICD-10-CM | POA: Diagnosis not present

## 2018-10-21 ENCOUNTER — Other Ambulatory Visit: Payer: Self-pay | Admitting: Family Medicine

## 2018-10-22 DIAGNOSIS — M79671 Pain in right foot: Secondary | ICD-10-CM | POA: Diagnosis not present

## 2018-10-22 DIAGNOSIS — M79672 Pain in left foot: Secondary | ICD-10-CM | POA: Diagnosis not present

## 2018-10-23 DIAGNOSIS — E1142 Type 2 diabetes mellitus with diabetic polyneuropathy: Secondary | ICD-10-CM | POA: Diagnosis not present

## 2018-10-23 DIAGNOSIS — I5032 Chronic diastolic (congestive) heart failure: Secondary | ICD-10-CM | POA: Diagnosis not present

## 2018-10-23 DIAGNOSIS — I2510 Atherosclerotic heart disease of native coronary artery without angina pectoris: Secondary | ICD-10-CM | POA: Diagnosis not present

## 2018-10-23 DIAGNOSIS — I110 Hypertensive heart disease with heart failure: Secondary | ICD-10-CM | POA: Diagnosis not present

## 2018-10-23 DIAGNOSIS — G20 Parkinson's disease: Secondary | ICD-10-CM | POA: Diagnosis not present

## 2018-10-25 DIAGNOSIS — M5137 Other intervertebral disc degeneration, lumbosacral region: Secondary | ICD-10-CM | POA: Diagnosis not present

## 2018-10-30 DIAGNOSIS — M79671 Pain in right foot: Secondary | ICD-10-CM | POA: Diagnosis not present

## 2018-10-30 DIAGNOSIS — E11621 Type 2 diabetes mellitus with foot ulcer: Secondary | ICD-10-CM | POA: Insufficient documentation

## 2018-10-30 DIAGNOSIS — L97509 Non-pressure chronic ulcer of other part of unspecified foot with unspecified severity: Secondary | ICD-10-CM | POA: Insufficient documentation

## 2018-10-31 ENCOUNTER — Ambulatory Visit: Payer: Medicare Other | Admitting: Family Medicine

## 2018-11-01 DIAGNOSIS — I2510 Atherosclerotic heart disease of native coronary artery without angina pectoris: Secondary | ICD-10-CM | POA: Diagnosis not present

## 2018-11-01 DIAGNOSIS — I5032 Chronic diastolic (congestive) heart failure: Secondary | ICD-10-CM | POA: Diagnosis not present

## 2018-11-01 DIAGNOSIS — E1142 Type 2 diabetes mellitus with diabetic polyneuropathy: Secondary | ICD-10-CM | POA: Diagnosis not present

## 2018-11-01 DIAGNOSIS — I110 Hypertensive heart disease with heart failure: Secondary | ICD-10-CM | POA: Diagnosis not present

## 2018-11-01 DIAGNOSIS — G20 Parkinson's disease: Secondary | ICD-10-CM | POA: Diagnosis not present

## 2018-11-04 DIAGNOSIS — E1142 Type 2 diabetes mellitus with diabetic polyneuropathy: Secondary | ICD-10-CM | POA: Diagnosis not present

## 2018-11-04 DIAGNOSIS — I2510 Atherosclerotic heart disease of native coronary artery without angina pectoris: Secondary | ICD-10-CM | POA: Diagnosis not present

## 2018-11-04 DIAGNOSIS — I5032 Chronic diastolic (congestive) heart failure: Secondary | ICD-10-CM | POA: Diagnosis not present

## 2018-11-04 DIAGNOSIS — I110 Hypertensive heart disease with heart failure: Secondary | ICD-10-CM | POA: Diagnosis not present

## 2018-11-04 DIAGNOSIS — G20 Parkinson's disease: Secondary | ICD-10-CM | POA: Diagnosis not present

## 2018-11-06 ENCOUNTER — Telehealth: Payer: Self-pay

## 2018-11-06 NOTE — Telephone Encounter (Signed)
Received forms for HH certification and plan of care. PCP will review and sign, if appropriate.  

## 2018-11-08 ENCOUNTER — Telehealth: Payer: Self-pay

## 2018-11-08 NOTE — Telephone Encounter (Signed)
Danton Sewer, PT called from [facility] asking for verbal orders to recert patient for physical therapy for 2x wk for 4 wks and 1x wk for 4 weeks   Call back # [phone]  Please advise

## 2018-11-10 NOTE — Telephone Encounter (Signed)
Signed and put in box to go up front.  

## 2018-11-11 ENCOUNTER — Telehealth: Payer: Self-pay | Admitting: Family Medicine

## 2018-11-11 NOTE — Telephone Encounter (Signed)
Left detailed message on secure VM advising okay for PT order and frequency. Office and fax number provided.

## 2018-11-11 NOTE — Telephone Encounter (Signed)
[place] certification and plan of care paperwork signed and faxed. Okay for verbal orders?

## 2018-11-11 NOTE — Telephone Encounter (Signed)
Yes this is fine.

## 2018-11-11 NOTE — Telephone Encounter (Signed)
Caller/Agency: [place] Number: [phone] [facility] to leave verbal on Voice Mail Requesting OT/PT/Skilled Nursing/Social Work/Speech Therapy: PT Frequency: 1 time a week for 7 weeks,

## 2018-11-13 ENCOUNTER — Ambulatory Visit (INDEPENDENT_AMBULATORY_CARE_PROVIDER_SITE_OTHER): Payer: Medicare Other | Admitting: Family Medicine

## 2018-11-13 ENCOUNTER — Encounter: Payer: Self-pay | Admitting: Family Medicine

## 2018-11-13 ENCOUNTER — Other Ambulatory Visit: Payer: Self-pay

## 2018-11-13 VITALS — Wt 185.0 lb

## 2018-11-13 DIAGNOSIS — E039 Hypothyroidism, unspecified: Secondary | ICD-10-CM | POA: Diagnosis not present

## 2018-11-13 DIAGNOSIS — I2510 Atherosclerotic heart disease of native coronary artery without angina pectoris: Secondary | ICD-10-CM | POA: Diagnosis not present

## 2018-11-13 DIAGNOSIS — I10 Essential (primary) hypertension: Secondary | ICD-10-CM | POA: Diagnosis not present

## 2018-11-13 DIAGNOSIS — F411 Generalized anxiety disorder: Secondary | ICD-10-CM | POA: Diagnosis not present

## 2018-11-13 DIAGNOSIS — E118 Type 2 diabetes mellitus with unspecified complications: Secondary | ICD-10-CM | POA: Diagnosis not present

## 2018-11-13 DIAGNOSIS — E7800 Pure hypercholesterolemia, unspecified: Secondary | ICD-10-CM | POA: Diagnosis not present

## 2018-11-13 NOTE — Progress Notes (Signed)
Virtual Visit via Video Note  I connected with pt on 11/13/18 at  4:15 PM EDT by a video enabled telemedicine application and verified that I am speaking with the correct person using two identifiers.  Location patient: home Location provider:work or home office Persons participating in the virtual visit: patient, provider  I discussed the limitations of evaluation and management by telemedicine and the availability of in person appointments. The patient expressed understanding and agreed to proceed.  Telemedicine visit is a necessity given the COVID-19 restrictions in place at the current time.  HPI: 85 y/o WF being seen today for 3 mo f/u DM 2, HTN, chronic anxiety. Of note, she saw Dr. Carles Collet 08/26/18 and was dx'd with parkinson's dz, started on carbidopa/levadopa and is now on this tid.  PT was arranged.  F/u with Dr. Carles Collet is scheduled for 02/05/19. Pt does feel like the med has been helping with tremor.   Also, her husband has had a significant illness recently and he ultimately was dx'd with cholangiocarcinoma. She is very busy helping him at home and with getting lots of testing and going to MD visits. She is very tired lately due to all this.  DM: last visit I increased her pioglit to 30mg  qd. Gluc 130s-150.  HTN: no change in regimen was made last visit. Home bp has been normal.  Anxiety: last visit was stable on remeron, duloxetine, and alprazolam.  ROS: no CP, no SOB, no wheezing, no cough, no dizziness, no HAs, no rashes, no melena/hematochezia.  No polyuria or polydipsia.    Past Medical History:  Diagnosis Date  . Abdominal pain, right lower quadrant   . Cataracts, both eyes    soon to have cataract surg as of 03/2015  . Chronic diastolic heart failure ([place]) 2019  . Diabetes mellitus with complication (HCC)    DPN.  NO diab retinopathy as of 06/2017 eye exam.  . Diabetic peripheral neuropathy associated with type 2 diabetes mellitus ([place])   . Diverticulosis of colon    noted on colonoscopy 2008  . Dyspnea    a. 09/2011 CTA Chest: No PE, Ca2+ cors;  b. 09/2011 R&L heart Cath: Relatively nl R heart pressures, nl co/ci, nonobs cad, nl EF.  12/2015 stress test normal, echo with grd II DD--likely explains DOE.  Worsening chronic DOE 2019-->cath w/no agiograph signif CAD.  Pulm HTN/DD.  EF 65%.  Cards started lasix.  Marland Kitchen External hemorrhoid   . Fatigue    w/ ? excessive daytime somnolence; ? OSA--eval by Dr. Halford Chessman 06/2016, home sleep study ordered.  . Former smoker    30 pack-yrs, quit 2005  . Heart murmur    hx of  . Hx of adenomatous polyp of colon 08/25/2016   07/2016 no recall  . Hyperlipemia, mixed    trigs remain elevated (200s-300s range), but LDL at goal.  . Hypertension   . Hypertensive heart disease 2019  . Hypothyroidism   . IBS (irritable bowel syndrome)    IBS-D (Dr. Carlean Purl) + ? worsened by GI side effect of metformin.  NO pancreatic insufficiency.  As per 09/2017 GI f/u, plan is to continue dicyclomine + f/u with them on prn basis  . Interstitial cystitis   . Liver function study, abnormal   . Lumbar degenerative disc disease    Lumb spondylosis & facet-mediated pain per Dr. Nelva Bush.  Has gotten repeated LB injections.  Eval by Dr. Tonita Cong 03/2017.  Spinal stenosis without neurogenic claudication.  RF neurotomy 05/2017 and 01/2018->on  L and R L3 and L4 medial branch and L5 dorsal ramus nerves.  . Lumbar scoliosis    lumbosacral spondylosis w/out myelopathy (radiofrequency ablation/lesioning procedure planned for 05/2017).  . Macular degeneration    OS exudative (avastin treatments).  Advanced nonexudative OD, monitor.  AREDS  . Macular hole 07/05/2017   OD; surgically repaired 07/2017.  . Menopausal syndrome    restarted estradiol 2016  . Morton's neuroma   . Obesity, Class II, BMI 35-39.9   . OSA on CPAP 07/2016   HST 07/27/16 >> AHI 6.2, SaO2 low 79%. Did not tolerate CPAP.  Pt to try again as of 12/2017.  Compliant with CPAP as of 02/2018 pulm f/u.  .  Osteoarthritis of both hips 2019   surgery NOT recommended ([facility])  . Other specified gastritis without mention of hemorrhage    hx of antral gastritis on EGD 05/2009  . Peripheral neuropathy    ? DPN?--per EMG 11/2012.  Intolerant of gabapentin, failed pamelor.  Radiofrequency neurotomy 12/11/14 helped LB pain (Dr. Nelva Bush)  . Pneumonia   . RLS (restless legs syndrome)   . Tarsal tunnel syndrome   . Trochanteric bursitis of both hips 2019   Injections: [facility]  . Unspecified venous (peripheral) insufficiency     Past Surgical History:  Procedure Laterality Date  . ANKLE SURGERY     bilateral  . CARDIAC CATHETERIZATION  09/2011;11/2017   2013: Nonobstructive CAD. 2019: mild pulmonary hypertension. LV end diastolic pressure is moderately elevated.  EF >65%.  DD/hypertensive heart dz-->BP control and add ASA 81mg  + lasix qd.  . CARDIOVASCULAR STRESS TEST  01/04/2016   Normal stress nuclear study with no ischemia or infarction; EF 82 with normal wall motion  . CATARACT EXTRACTION    . COLONOSCOPY  2002/2005, 11/14/06, and 05/2009   polyps 2002 but none 2005 or 2008 or 2011.  Repeat 08/17/16 for diarrhea: one diminutive adenoma + small polyps removed, diverticulosis noted--no further colonoscopies needed (Dr. Carlean Purl).  . CT angiogram chest  11/05/2017   NEG for PE.  Marland Kitchen DEXA  10/2016   NORMAL--consider repeat 2 yrs.  . ESOPHAGOGASTRODUODENOSCOPY  05/2009   Moderate antral gastritis  . FOOT SURGERY     R foot bunionectomy and arthroplasty of digits R foot.  Arthroplasty digits 3 and 4 L foot.  Marland Kitchen HAMMER TOE SURGERY     bilateral  . left tarsal tunnel release     sept '11 (Dr Beola Cord)  . PARTIAL KNEE ARTHROPLASTY Right 11/06/2016   Procedure: UNICOMPARTMENTAL RIGHT KNEE- Medially;  Surgeon: Paralee Cancel, MD;  Location: [facility];  Service: Orthopedics;  Laterality: Right;  90 mins  . PFTs  12/05/2017   no obstruction, mild restriction. No reversibility. Moderate DLCO defect --continue prn  albut  . radiofrequency neurotomy  06/18/2015   bilat L3-4 medial branch nerve and bilat L5 dorsal ramus nerve.(Dr. Nelva Bush)  . RIGHT/LEFT HEART CATH AND CORONARY ANGIOGRAPHY N/A 12/07/2017   Procedure: RIGHT/LEFT HEART CATH AND CORONARY ANGIOGRAPHY;  Surgeon: Leonie Man, MD;  Location: [facility];  Service: Cardiovascular;  Laterality: N/A;  . TONSILLECTOMY    . TOTAL ABDOMINAL HYSTERECTOMY W/ BILATERAL SALPINGOOPHORECTOMY    . TRANSTHORACIC ECHOCARDIOGRAM  08/2011; 2017   2013 Grade I DD; 2017 EF 60-65%, normal wall motion, grd II DD.    Family History  Problem Relation Age of Onset  . Cancer Mother        Breast  . Breast cancer Mother   . Hypertension Father   .  Hyperlipidemia Father   . Diabetes Maternal Grandfather   . Mental illness Brother   . Colon cancer Neg Hx     SOCIAL HX:  Social History   Socioeconomic History  . Marital status: Married    Spouse name: Not on file  . Number of children: Not on file  . Years of education: Not on file  . Highest education level: Not on file  Occupational History  . Not on file  Social Needs  . Financial resource strain: Not on file  . Food insecurity    Worry: Not on file    Inability: Not on file  . Transportation needs    Medical: Not on file    Non-medical: Not on file  Tobacco Use  . Smoking status: Former Smoker    Packs/day: 1.00    Years: 35.00    Pack years: 35.00    Types: Cigarettes    Quit date: 03/27/2002    Years since quitting: 16.6  . Smokeless tobacco: Never Used  Substance and Sexual Activity  . Alcohol use: No    Alcohol/week: 0.0 standard drinks  . Drug use: No  . Sexual activity: Not Currently  Lifestyle  . Physical activity    Days per week: Not on file    Minutes per session: Not on file  . Stress: Not on file  Relationships  . Social Herbalist on phone: Not on file    Gets together: Not on file    Attends religious service: Not on file    Active member of club or  organization: Not on file    Attends meetings of clubs or organizations: Not on file    Relationship status: Not on file  Other Topics Concern  . Not on file  Social History Narrative   Married '57   3 sons- '60, '61, '64, grandchildren 30 (7 girls, 1 boy)   Lives independently with husband   Patient is a former smoker: quit 2004.   Alcohol use- yes socially not in years.   Illicit Drug use- no   Occupation: Biomedical scientist      Current Outpatient Medications:  .  ALPRAZolam (XANAX) 0.25 MG tablet, Take 1 tablet (0.25 mg total) by mouth at bedtime as needed for anxiety., Disp: 30 tablet, Rfl: 1 .  aspirin EC 81 MG tablet, Take 81 mg by mouth daily., Disp: , Rfl:  .  atorvastatin (LIPITOR) 20 MG tablet, TAKE ONE TABLET BY MOUTH DAILY, Disp: 90 tablet, Rfl: 1 .  calcium carbonate (CALCIUM 600) 600 MG TABS tablet, Take 600 mg by mouth daily. , Disp: , Rfl:  .  carbidopa-levodopa (SINEMET IR) 25-100 MG tablet, Take 1 tablet by mouth 3 (three) times daily., Disp: 270 tablet, Rfl: 1 .  celecoxib (CELEBREX) 200 MG capsule, TAKE ONE CAPSULE BY MOUTH EVERY DAY, Disp: 90 capsule, Rfl: 1 .  diclofenac sodium (VOLTAREN) 1 % GEL, Apply 2 g topically 4 (four) times daily. , Disp: , Rfl: 1 .  dicyclomine (BENTYL) 10 MG capsule, Take 1 tab 2-3 times daily for diarrhea/urgency., Disp: 90 capsule, Rfl: 6 .  DULoxetine (CYMBALTA) 60 MG capsule, TAKE ONE CAPSULE BY MOUTH DAILY, Disp: 90 capsule, Rfl: 0 .  estradiol (ESTRACE) 0.5 MG tablet, Take 0.5 mg by mouth at bedtime., Disp: , Rfl:  .  ferrous sulfate 325 (65 FE) MG tablet, Take 1 tablet (325 mg total) by mouth 2 (two) times daily with a meal., Disp: 30 tablet, Rfl:  0 .  furosemide (LASIX) 20 MG tablet, 1 tab po qd PRN increase in LE edema, Disp: 45 tablet, Rfl: 3 .  glucose blood test strip, Use to check blood once daily, Disp: 100 each, Rfl: 11 .  glucose blood test strip, OneTouch Verio test strips  USE TO CHECK BLOOD ONCE DAILY, Disp: , Rfl:  .   HYDROcodone-acetaminophen (NORCO/VICODIN) 5-325 MG tablet, Take 1 tablet by mouth 3 (three) times daily as needed., Disp: , Rfl: 0 .  Lancets 30G MISC, Use to check blood sugar once daily, Disp: 100 each, Rfl: 11 .  levothyroxine (SYNTHROID) 25 MCG tablet, TAKE ONE TABLET BY MOUTH DAILY, Disp: 90 tablet, Rfl: 0 .  loratadine (CLARITIN) 10 MG tablet, Take 10 mg by mouth daily as needed for allergies., Disp: , Rfl:  .  losartan-hydrochlorothiazide (HYZAAR) 50-12.5 MG tablet, TAKE ONE TABLET BY MOUTH EVERY DAY, Disp: 90 tablet, Rfl: 0 .  metoprolol tartrate (LOPRESSOR) 50 MG tablet, TAKE ONE TABLET BY MOUTH TWICE DAILY, Disp: 180 tablet, Rfl: 1 .  mirtazapine (REMERON) 15 MG tablet, TAKE 1/2 TABLET BY MOUTH AT BEDTIME, Disp: 45 tablet, Rfl: 3 .  Multiple Vitamins-Minerals (MULTIVITAMIN ADULT PO), Take 1 tablet by mouth daily. , Disp: , Rfl:  .  Multiple Vitamins-Minerals (PRESERVISION AREDS 2 PO), Take 1 tablet by mouth daily. , Disp: , Rfl:  .  omeprazole (PRILOSEC) 20 MG capsule, TAKE ONE CAPSULE BY MOUTH DAILY, Disp: 90 capsule, Rfl: 1 .  ondansetron (ZOFRAN) 4 MG tablet, Take 1 tablet by mouth as needed., Disp: , Rfl:  .  OVER THE COUNTER MEDICATION, Take 1 drop by mouth daily. , Disp: , Rfl:  .  pioglitazone (ACTOS) 30 MG tablet, Take 1 tablet (30 mg total) by mouth daily., Disp: 30 tablet, Rfl: 0 .  potassium chloride SA (K-DUR,KLOR-CON) 20 MEQ tablet, Take 1 tablet (20 mEq total) by mouth daily., Disp: 90 tablet, Rfl: 3 .  pregabalin (LYRICA) 50 MG capsule, TAKE ONE CAPSULE BY MOUTH TWICE DAILY, Disp: 180 capsule, Rfl: 1 .  tiZANidine (ZANAFLEX) 4 MG tablet, Take 4 mg by mouth every 8 (eight) hours as needed for muscle spasms. , Disp: , Rfl: 1 .  albuterol (PROVENTIL HFA;VENTOLIN HFA) 108 (90 Base) MCG/ACT inhaler, Inhale 2 puffs into the lungs every 6 (six) hours as needed for wheezing or shortness of breath. (Patient not taking: Reported on 11/13/2018), Disp: 1 Inhaler, Rfl: 1 .  oxybutynin  (DITROPAN-XL) 10 MG 24 hr tablet, Take 1 tablet (10 mg total) by mouth daily as needed (urine frequency)., Disp: 30 tablet, Rfl: 6  EXAM:  VITALS per patient if applicable:  GENERAL: alert, oriented, appears well and in no acute distress  HEENT: atraumatic, conjunttiva clear, no obvious abnormalities on inspection of external nose and ears  NECK: normal movements of the head and neck  LUNGS: on inspection no signs of respiratory distress, breathing rate appears normal, no obvious gross SOB, gasping or wheezing  CV: no obvious cyanosis  MS: moves all visible extremities without noticeable abnormality  PSYCH/NEURO: pleasant and cooperative, no obvious depression or anxiety, speech and thought processing grossly intact  LABS: none today    Chemistry      Component Value Date/Time   NA 142 05/09/2018 1550   K 3.8 05/09/2018 1550   CL 95 (L) 05/09/2018 1550   CO2 24 05/09/2018 1550   BUN 19 05/09/2018 1550   CREATININE 0.76 05/09/2018 1550   CREATININE 0.72 04/17/2017 0803  Component Value Date/Time   CALCIUM 9.2 05/09/2018 1550   ALKPHOS 99 11/01/2017 1103   AST 16 11/01/2017 1103   ALT 21 11/01/2017 1103   BILITOT 0.4 11/01/2017 1103     Lab Results  Component Value Date   WBC 8.9 05/09/2018   HGB 11.1 05/09/2018   HCT 32.5 (L) 05/09/2018   MCV 92 05/09/2018   PLT 327 05/09/2018   Lab Results  Component Value Date   TSH 3.06 11/01/2017   Lab Results  Component Value Date   HGBA1C 6.1 (A) 04/24/2018   Lab Results  Component Value Date   CHOL 152 05/09/2018   HDL 46 05/09/2018   LDLCALC 52 05/09/2018   LDLDIRECT 76.0 01/18/2015   TRIG 269 (H) 05/09/2018   CHOLHDL 3.3 05/09/2018    ASSESSMENT AND PLAN:  Discussed the following assessment and plan:  1) DM 2: fairly stable.  Continue actos 30mg  qd. Plan A1c and urine microalb/cr check prior to next f/u in 3 mo.  2) HTN: The current medical regimen is effective;  continue present plan and  medications. Lytes/cr check prior to next f/u in 3 mo.  3) HLD: tolerating statin.  FLP-future.  4) Anxiety: she is dealing with a lot lately and seems to be coping ok. No change in meds at this time.  5) Parkinson's dz: new dx 08/2018.  Some improvement on sinemet is noted by pt. Keep f/u with Dr. Carles Collet scheduled for November this year. She'll continue with home PT.   Spent 25 min with pt today, with >50% of this time spent in counseling and care coordination regarding the above problems.  I discussed the assessment and treatment plan with the patient. The patient was provided an opportunity to ask questions and all were answered. The patient agreed with the plan and demonstrated an understanding of the instructions.   The patient was advised to call back or seek an in-person evaluation if the symptoms worsen or if the condition fails to improve as anticipated.  F/u: 3 mo RCI, lab visit 1 wk prior (CMET, A1c, TSH, FLP, urine microalb/cr).  Signed:  Crissie Sickles, MD           11/13/2018

## 2018-11-14 ENCOUNTER — Ambulatory Visit: Payer: Medicare Other

## 2018-11-15 DIAGNOSIS — I2510 Atherosclerotic heart disease of native coronary artery without angina pectoris: Secondary | ICD-10-CM | POA: Diagnosis not present

## 2018-11-15 DIAGNOSIS — I5032 Chronic diastolic (congestive) heart failure: Secondary | ICD-10-CM | POA: Diagnosis not present

## 2018-11-15 DIAGNOSIS — I110 Hypertensive heart disease with heart failure: Secondary | ICD-10-CM | POA: Diagnosis not present

## 2018-11-15 DIAGNOSIS — G20 Parkinson's disease: Secondary | ICD-10-CM | POA: Diagnosis not present

## 2018-11-15 DIAGNOSIS — E1142 Type 2 diabetes mellitus with diabetic polyneuropathy: Secondary | ICD-10-CM | POA: Diagnosis not present

## 2018-11-20 DIAGNOSIS — G20 Parkinson's disease: Secondary | ICD-10-CM | POA: Diagnosis not present

## 2018-11-20 DIAGNOSIS — E11621 Type 2 diabetes mellitus with foot ulcer: Secondary | ICD-10-CM | POA: Diagnosis not present

## 2018-11-20 DIAGNOSIS — E1142 Type 2 diabetes mellitus with diabetic polyneuropathy: Secondary | ICD-10-CM | POA: Diagnosis not present

## 2018-11-20 DIAGNOSIS — I5032 Chronic diastolic (congestive) heart failure: Secondary | ICD-10-CM | POA: Diagnosis not present

## 2018-11-20 DIAGNOSIS — I2510 Atherosclerotic heart disease of native coronary artery without angina pectoris: Secondary | ICD-10-CM | POA: Diagnosis not present

## 2018-11-20 DIAGNOSIS — I110 Hypertensive heart disease with heart failure: Secondary | ICD-10-CM | POA: Diagnosis not present

## 2018-11-21 DIAGNOSIS — E1142 Type 2 diabetes mellitus with diabetic polyneuropathy: Secondary | ICD-10-CM | POA: Diagnosis not present

## 2018-11-21 DIAGNOSIS — I5032 Chronic diastolic (congestive) heart failure: Secondary | ICD-10-CM | POA: Diagnosis not present

## 2018-11-21 DIAGNOSIS — I110 Hypertensive heart disease with heart failure: Secondary | ICD-10-CM | POA: Diagnosis not present

## 2018-11-21 DIAGNOSIS — I2510 Atherosclerotic heart disease of native coronary artery without angina pectoris: Secondary | ICD-10-CM | POA: Diagnosis not present

## 2018-11-21 DIAGNOSIS — G20 Parkinson's disease: Secondary | ICD-10-CM | POA: Diagnosis not present

## 2018-11-26 ENCOUNTER — Ambulatory Visit (INDEPENDENT_AMBULATORY_CARE_PROVIDER_SITE_OTHER): Payer: Medicare Other

## 2018-11-26 ENCOUNTER — Other Ambulatory Visit: Payer: Self-pay

## 2018-11-26 DIAGNOSIS — Z23 Encounter for immunization: Secondary | ICD-10-CM

## 2018-11-26 DIAGNOSIS — E11621 Type 2 diabetes mellitus with foot ulcer: Secondary | ICD-10-CM

## 2018-11-26 HISTORY — DX: Type 2 diabetes mellitus with foot ulcer: E11.621

## 2018-11-29 DIAGNOSIS — M5136 Other intervertebral disc degeneration, lumbar region: Secondary | ICD-10-CM | POA: Diagnosis not present

## 2018-11-29 DIAGNOSIS — M47896 Other spondylosis, lumbar region: Secondary | ICD-10-CM | POA: Diagnosis not present

## 2018-11-29 DIAGNOSIS — M5416 Radiculopathy, lumbar region: Secondary | ICD-10-CM | POA: Diagnosis not present

## 2018-11-29 DIAGNOSIS — M545 Low back pain: Secondary | ICD-10-CM | POA: Diagnosis not present

## 2018-12-09 DIAGNOSIS — M47896 Other spondylosis, lumbar region: Secondary | ICD-10-CM | POA: Diagnosis not present

## 2018-12-09 DIAGNOSIS — M545 Low back pain: Secondary | ICD-10-CM | POA: Diagnosis not present

## 2018-12-10 DIAGNOSIS — N3281 Overactive bladder: Secondary | ICD-10-CM | POA: Diagnosis not present

## 2018-12-10 DIAGNOSIS — N3020 Other chronic cystitis without hematuria: Secondary | ICD-10-CM | POA: Diagnosis not present

## 2018-12-10 DIAGNOSIS — R300 Dysuria: Secondary | ICD-10-CM | POA: Diagnosis not present

## 2018-12-11 ENCOUNTER — Encounter: Payer: Self-pay | Admitting: Family Medicine

## 2018-12-11 DIAGNOSIS — E11621 Type 2 diabetes mellitus with foot ulcer: Secondary | ICD-10-CM | POA: Insufficient documentation

## 2018-12-11 DIAGNOSIS — E13621 Other specified diabetes mellitus with foot ulcer: Secondary | ICD-10-CM | POA: Diagnosis not present

## 2018-12-16 ENCOUNTER — Other Ambulatory Visit: Payer: Self-pay

## 2018-12-16 ENCOUNTER — Other Ambulatory Visit: Payer: Self-pay | Admitting: Neurology

## 2018-12-16 ENCOUNTER — Ambulatory Visit
Admission: RE | Admit: 2018-12-16 | Discharge: 2018-12-16 | Disposition: A | Discharge status: Home / Self Care | Payer: Medicare Other | Source: Ambulatory Visit | Attending: Family Medicine | Admitting: Family Medicine

## 2018-12-16 ENCOUNTER — Other Ambulatory Visit: Payer: Self-pay | Admitting: Family Medicine

## 2018-12-16 DIAGNOSIS — I10 Essential (primary) hypertension: Secondary | ICD-10-CM

## 2018-12-16 DIAGNOSIS — Z1231 Encounter for screening mammogram for malignant neoplasm of breast: Secondary | ICD-10-CM | POA: Diagnosis not present

## 2018-12-16 NOTE — Telephone Encounter (Signed)
Requested Prescriptions   Pending Prescriptions Disp Refills  . carbidopa-levodopa (SINEMET IR) 25-100 MG tablet [Pharmacy Med Name: carbidopa 25 mg-levodopa 100 mg tablet] 270 tablet 1    Sig: TAKE ONE TABLET BY MOUTH THREE TIMES DAILY   Rx last filled: 08/26/18 #270 1 refills  Pt last seen: 08/26/18   Follow up appt scheduled: 02/05/19

## 2018-12-19 ENCOUNTER — Other Ambulatory Visit: Payer: Self-pay | Admitting: Family Medicine

## 2018-12-19 ENCOUNTER — Encounter: Payer: Self-pay | Admitting: Family Medicine

## 2018-12-19 NOTE — Telephone Encounter (Signed)
Requesting: Alprazolam Contract: no UDS: no Last Visit: 11/13/18 Next Visit: 02/13/19 Last Refill: 04/25/18(30,1)  Please Advise

## 2018-12-20 NOTE — Telephone Encounter (Signed)
Message has been sent to Dr. [name] regarding refill per chart note

## 2018-12-25 DIAGNOSIS — M5136 Other intervertebral disc degeneration, lumbar region: Secondary | ICD-10-CM | POA: Diagnosis not present

## 2018-12-25 DIAGNOSIS — M545 Low back pain: Secondary | ICD-10-CM | POA: Diagnosis not present

## 2018-12-25 DIAGNOSIS — M48062 Spinal stenosis, lumbar region with neurogenic claudication: Secondary | ICD-10-CM | POA: Diagnosis not present

## 2018-12-27 DIAGNOSIS — H353222 Exudative age-related macular degeneration, left eye, with inactive choroidal neovascularization: Secondary | ICD-10-CM | POA: Diagnosis not present

## 2018-12-31 ENCOUNTER — Other Ambulatory Visit: Payer: Self-pay | Admitting: Physical Medicine and Rehabilitation

## 2018-12-31 DIAGNOSIS — M545 Low back pain, unspecified: Secondary | ICD-10-CM

## 2019-01-01 ENCOUNTER — Encounter: Payer: Self-pay | Admitting: Family Medicine

## 2019-01-02 DIAGNOSIS — E13621 Other specified diabetes mellitus with foot ulcer: Secondary | ICD-10-CM | POA: Diagnosis not present

## 2019-01-07 ENCOUNTER — Encounter: Payer: Self-pay | Admitting: Family Medicine

## 2019-01-07 ENCOUNTER — Ambulatory Visit
Admission: RE | Admit: 2019-01-07 | Discharge: 2019-01-07 | Disposition: A | Discharge status: Home / Self Care | Payer: Medicare Other | Source: Ambulatory Visit | Attending: Physical Medicine and Rehabilitation | Admitting: Physical Medicine and Rehabilitation

## 2019-01-07 ENCOUNTER — Other Ambulatory Visit: Payer: Self-pay

## 2019-01-07 DIAGNOSIS — M545 Low back pain, unspecified: Secondary | ICD-10-CM

## 2019-01-07 DIAGNOSIS — M48061 Spinal stenosis, lumbar region without neurogenic claudication: Secondary | ICD-10-CM | POA: Diagnosis not present

## 2019-01-09 DIAGNOSIS — M4316 Spondylolisthesis, lumbar region: Secondary | ICD-10-CM | POA: Diagnosis not present

## 2019-01-09 DIAGNOSIS — M545 Low back pain: Secondary | ICD-10-CM | POA: Diagnosis not present

## 2019-01-13 ENCOUNTER — Other Ambulatory Visit: Payer: Self-pay | Admitting: Family Medicine

## 2019-01-13 NOTE — Telephone Encounter (Signed)
RF request for lyrica. Last OV 11/13/2018 Next OV 02/13/2019  Last RX 07/01/2018 # 180 x 1 RF.  Please advise.

## 2019-01-16 ENCOUNTER — Other Ambulatory Visit: Payer: Medicare Other

## 2019-01-17 DIAGNOSIS — E13621 Other specified diabetes mellitus with foot ulcer: Secondary | ICD-10-CM | POA: Diagnosis not present

## 2019-01-21 ENCOUNTER — Encounter: Payer: Self-pay | Admitting: Family Medicine

## 2019-01-21 ENCOUNTER — Other Ambulatory Visit: Payer: Self-pay

## 2019-01-21 MED ORDER — LANCETS 30G MISC: 11 refills | Status: DC

## 2019-01-27 ENCOUNTER — Other Ambulatory Visit: Payer: Self-pay

## 2019-01-27 MED ORDER — LANCETS 30G MISC: 11 refills | Status: DC

## 2019-01-27 NOTE — Telephone Encounter (Signed)
Patient [facility], [facility] is her preferred pharmacy but cannot fill her lancets because of medicare part D. Patient states she has requested this numerous of times.  [facility]

## 2019-01-28 ENCOUNTER — Encounter: Payer: Self-pay | Admitting: Family Medicine

## 2019-01-31 DIAGNOSIS — M47816 Spondylosis without myelopathy or radiculopathy, lumbar region: Secondary | ICD-10-CM | POA: Diagnosis not present

## 2019-02-03 DIAGNOSIS — E13621 Other specified diabetes mellitus with foot ulcer: Secondary | ICD-10-CM | POA: Diagnosis not present

## 2019-02-03 NOTE — Progress Notes (Signed)
Virtual Visit via Telephone Note The purpose of this virtual visit is to provide medical care while limiting exposure to the novel coronavirus.    Consent was obtained for phone visit:  Yes.   Answered questions that patient had about telehealth interaction:  Yes.   I discussed the limitations, risks, security and privacy concerns of performing an evaluation and management service by telephone. I also discussed with the patient that there may be a patient responsible charge related to this service. The patient expressed understanding and agreed to proceed.  Pt location: Home Physician Location: home Name of referring provider:  Tammi Sou, MD I connected with .Katherine Best at patients initiation/request on 02/05/2019 at  9:45 AM EST by telephone and verified that I am speaking with the correct person using two identifiers.  Pt MRN:  [id number] Pt DOB:  April 22, 1933   History of Present Illness:  Patient seen today in follow-up for Parkinson's disease, which was just diagnosed last visit in June.  I started her on levodopa and worked to carbidopa/levodopa 25/100, 1 tablet 3 times per day.  She reports that she is not sure if the medication is effective or not.  No SE unless "it caused me to gain weight."  Has had trouble working it around her other meds.  She did home physical therapy since our last visit but "I let them go because it got my back hurting and they tried to do therapy on my back and it made it worse."  She does have chronic back pain and had a nerve ablation with Dr. Nelva Bush recently.  Pt denies falls.  Pt denies lightheadedness, near syncope.  No hallucinations.  Mood has been good.  Medical records have been reviewed since our last visit.  She has been seen at [facility] for diabetic foot ulcer.   Current Outpatient Medications:  .  albuterol (PROVENTIL HFA;VENTOLIN HFA) 108 (90 Base) MCG/ACT inhaler, Inhale 2 puffs into the lungs every 6 (six) hours as needed for  wheezing or shortness of breath., Disp: 1 Inhaler, Rfl: 1 .  ALPRAZolam (XANAX) 0.25 MG tablet, Take 1 tablet (0.25 mg total) by mouth at bedtime as needed for anxiety., Disp: 30 tablet, Rfl: 1 .  aspirin EC 81 MG tablet, Take 81 mg by mouth daily., Disp: , Rfl:  .  atorvastatin (LIPITOR) 20 MG tablet, TAKE ONE TABLET BY MOUTH DAILY, Disp: 90 tablet, Rfl: 1 .  calcium carbonate (CALCIUM 600) 600 MG TABS tablet, Take 600 mg by mouth daily. , Disp: , Rfl:  .  carbidopa-levodopa (SINEMET IR) 25-100 MG tablet, TAKE ONE TABLET BY MOUTH THREE TIMES DAILY, Disp: 270 tablet, Rfl: 1 .  celecoxib (CELEBREX) 200 MG capsule, TAKE ONE CAPSULE BY MOUTH EVERY DAY, Disp: 90 capsule, Rfl: 1 .  diclofenac sodium (VOLTAREN) 1 % GEL, Apply 2 g topically 4 (four) times daily. , Disp: , Rfl: 1 .  dicyclomine (BENTYL) 10 MG capsule, Take 1 tab 2-3 times daily for diarrhea/urgency., Disp: 90 capsule, Rfl: 6 .  DULoxetine (CYMBALTA) 60 MG capsule, TAKE ONE CAPSULE BY MOUTH DAILY, Disp: 90 capsule, Rfl: 0 .  estradiol (ESTRACE) 0.5 MG tablet, Take 0.5 mg by mouth at bedtime., Disp: , Rfl:  .  estradiol (ESTRACE) 1 MG tablet, Take 1 mg by mouth daily., Disp: , Rfl:  .  ferrous sulfate 325 (65 FE) MG tablet, Take 1 tablet (325 mg total) by mouth 2 (two) times daily with a meal., Disp: 30 tablet, Rfl:  0 .  furosemide (LASIX) 20 MG tablet, 1 tab po qd PRN increase in LE edema, Disp: 45 tablet, Rfl: 3 .  glucose blood test strip, Use to check blood once daily, Disp: 100 each, Rfl: 11 .  glucose blood test strip, OneTouch Verio test strips  USE TO CHECK BLOOD ONCE DAILY, Disp: , Rfl:  .  HYDROcodone-acetaminophen (NORCO/VICODIN) 5-325 MG tablet, Take 1 tablet by mouth 3 (three) times daily as needed., Disp: , Rfl: 0 .  Lancets 30G MISC, Use to check blood sugar once daily, Disp: 100 each, Rfl: 11 .  levothyroxine (SYNTHROID) 25 MCG tablet, TAKE ONE TABLET BY MOUTH DAILY, Disp: 90 tablet, Rfl: 0 .  loratadine (CLARITIN) 10 MG  tablet, Take 10 mg by mouth daily as needed for allergies., Disp: , Rfl:  .  losartan-hydrochlorothiazide (HYZAAR) 50-12.5 MG tablet, TAKE ONE TABLET BY MOUTH EVERY DAY, Disp: 90 tablet, Rfl: 0 .  metoprolol tartrate (LOPRESSOR) 50 MG tablet, TAKE ONE TABLET BY MOUTH TWICE DAILY, Disp: 180 tablet, Rfl: 1 .  mirtazapine (REMERON) 15 MG tablet, TAKE 1/2 TABLET BY MOUTH AT BEDTIME, Disp: 45 tablet, Rfl: 3 .  Multiple Vitamins-Minerals (MULTIVITAMIN ADULT PO), Take 1 tablet by mouth daily. , Disp: , Rfl:  .  Multiple Vitamins-Minerals (PRESERVISION AREDS 2 PO), Take 1 tablet by mouth daily. , Disp: , Rfl:  .  MYRBETRIQ 50 MG TB24 tablet, Take 50 mg by mouth daily., Disp: , Rfl:  .  omeprazole (PRILOSEC) 20 MG capsule, TAKE ONE CAPSULE BY MOUTH DAILY, Disp: 90 capsule, Rfl: 1 .  ondansetron (ZOFRAN) 4 MG tablet, Take 1 tablet by mouth as needed., Disp: , Rfl:  .  OVER THE COUNTER MEDICATION, Take 1 drop by mouth daily. , Disp: , Rfl:  .  oxybutynin (DITROPAN-XL) 10 MG 24 hr tablet, Take 1 tablet (10 mg total) by mouth daily as needed (urine frequency)., Disp: 30 tablet, Rfl: 6 .  oxyCODONE-acetaminophen (PERCOCET/ROXICET) 5-325 MG tablet, Take 1 tablet by mouth 4 (four) times daily as needed., Disp: , Rfl:  .  pioglitazone (ACTOS) 30 MG tablet, Take 1 tablet (30 mg total) by mouth daily., Disp: 30 tablet, Rfl: 0 .  potassium chloride SA (K-DUR,KLOR-CON) 20 MEQ tablet, Take 1 tablet (20 mEq total) by mouth daily., Disp: 90 tablet, Rfl: 3 .  pregabalin (LYRICA) 50 MG capsule, TAKE ONE CAPSULE BY MOUTH TWICE DAILY, Disp: 180 capsule, Rfl: 1 .  tiZANidine (ZANAFLEX) 4 MG tablet, Take 4 mg by mouth every 8 (eight) hours as needed for muscle spasms. , Disp: , Rfl: 1    Observations/Objective:   Vitals:   02/04/19 1034  BP: (!) 146/68  Weight: 200 lb (90.7 kg)     Assessment and Plan:   1.  Parkinson's disease, diagnosed June, 2020  -Continue carbidopa/levodopa 25/100, 1 tablet 3 times per day.   Discussed proper timing of that in relation to her other meds and protein today 2.  Chronic low back pain  -Follows with Dr. Nelva Bush.  Had trouble with tolerating PT for PD because of back pain. 3.  Diabetic peripheral neuropathy  -Safety discussed.  Dealing with a diabetic foot ulcer and seeing ortho for that.  Follow Up Instructions:  Her for a follow-up visit in person next time, since I have actually never seen her on levodopa and she is not sure if it is working.  For now, we will not change the dose.  She prefers to wait 4-5 months for follow-up, as she  states that she has a lot going on, especially of her husband who has dementia.  She will let me know if she needs me before that.  -I discussed the assessment and treatment plan with the patient. The patient was provided an opportunity to ask questions and all were answered. The patient agreed with the plan and demonstrated an understanding of the instructions.   The patient was advised to call back or seek an in-person evaluation if the symptoms worsen or if the condition fails to improve as anticipated.    Total Time spent in visit with the patient was:  12 min, of which 100% of the time was spent in counseling.   Pt understands and agrees with the plan of care outlined.     Alonza Bogus, DO

## 2019-02-04 ENCOUNTER — Encounter: Payer: Self-pay | Admitting: Neurology

## 2019-02-05 ENCOUNTER — Other Ambulatory Visit: Payer: Self-pay

## 2019-02-05 ENCOUNTER — Telehealth (INDEPENDENT_AMBULATORY_CARE_PROVIDER_SITE_OTHER): Payer: Medicare Other | Admitting: Neurology

## 2019-02-05 VITALS — BP 146/68 | Wt 200.0 lb

## 2019-02-05 DIAGNOSIS — G20 Parkinson's disease: Secondary | ICD-10-CM

## 2019-02-05 DIAGNOSIS — M545 Low back pain, unspecified: Secondary | ICD-10-CM

## 2019-02-05 DIAGNOSIS — G8929 Other chronic pain: Secondary | ICD-10-CM | POA: Diagnosis not present

## 2019-02-05 DIAGNOSIS — E1142 Type 2 diabetes mellitus with diabetic polyneuropathy: Secondary | ICD-10-CM | POA: Diagnosis not present

## 2019-02-05 DIAGNOSIS — G20A1 Parkinson's disease without dyskinesia, without mention of fluctuations: Secondary | ICD-10-CM

## 2019-02-06 ENCOUNTER — Ambulatory Visit (INDEPENDENT_AMBULATORY_CARE_PROVIDER_SITE_OTHER): Payer: Medicare Other | Admitting: Family Medicine

## 2019-02-06 DIAGNOSIS — E118 Type 2 diabetes mellitus with unspecified complications: Secondary | ICD-10-CM | POA: Diagnosis not present

## 2019-02-06 DIAGNOSIS — I10 Essential (primary) hypertension: Secondary | ICD-10-CM

## 2019-02-06 DIAGNOSIS — E039 Hypothyroidism, unspecified: Secondary | ICD-10-CM

## 2019-02-06 DIAGNOSIS — E7800 Pure hypercholesterolemia, unspecified: Secondary | ICD-10-CM

## 2019-02-06 LAB — LIPID PANEL
Cholesterol: 161 mg/dL (ref 0–200)
HDL: 44.7 mg/dL (ref >39.00)
NonHDL: 115.99
Total CHOL/HDL Ratio: 4
Triglycerides: 307 mg/dL — ABNORMAL HIGH (ref 0.0–149.0)
VLDL: 61.4 mg/dL — ABNORMAL HIGH (ref 0.0–40.0)

## 2019-02-06 LAB — LDL CHOLESTEROL, DIRECT: Direct LDL: 73 mg/dL

## 2019-02-06 LAB — COMPREHENSIVE METABOLIC PANEL
ALT: 21 U/L (ref 0–35)
AST: 17 U/L (ref 0–37)
Albumin: 4 g/dL (ref 3.5–5.2)
Alkaline Phosphatase: 98 U/L (ref 39–117)
BUN: 20 mg/dL (ref 6–23)
CO2: 29 mEq/L (ref 19–32)
Calcium: 9 mg/dL (ref 8.4–10.5)
Chloride: 100 mEq/L (ref 96–112)
Creatinine, Ser: 0.75 mg/dL (ref 0.40–1.20)
GFR: 73.81 mL/min (ref >60.00)
Glucose, Bld: 143 mg/dL — ABNORMAL HIGH (ref 70–99)
Potassium: 3.8 mEq/L (ref 3.5–5.1)
Sodium: 140 mEq/L (ref 135–145)
Total Bilirubin: 0.4 mg/dL (ref 0.2–1.2)
Total Protein: 6.3 g/dL (ref 6.0–8.3)

## 2019-02-06 LAB — MICROALBUMIN / CREATININE URINE RATIO
Creatinine,U: 194.2 mg/dL
Microalb Creat Ratio: 0.9 mg/g (ref 0.0–30.0)
Microalb, Ur: 1.8 mg/dL (ref 0.0–1.9)

## 2019-02-06 LAB — TSH: TSH: 3.45 u[IU]/mL (ref 0.35–4.50)

## 2019-02-06 LAB — HEMOGLOBIN A1C: Hgb A1c MFr Bld: 6.5 % (ref 4.6–6.5)

## 2019-02-07 ENCOUNTER — Other Ambulatory Visit: Payer: Self-pay

## 2019-02-07 MED ORDER — OMEPRAZOLE 20 MG PO CPDR: 20.0000 mg | DELAYED_RELEASE_CAPSULE | Freq: Every day | ORAL | 1 refills | Status: AC

## 2019-02-11 ENCOUNTER — Other Ambulatory Visit: Payer: Self-pay | Admitting: Family Medicine

## 2019-02-12 ENCOUNTER — Other Ambulatory Visit: Payer: Self-pay

## 2019-02-13 ENCOUNTER — Encounter: Payer: Self-pay | Admitting: Family Medicine

## 2019-02-13 ENCOUNTER — Other Ambulatory Visit: Payer: Self-pay

## 2019-02-13 ENCOUNTER — Ambulatory Visit (INDEPENDENT_AMBULATORY_CARE_PROVIDER_SITE_OTHER): Payer: Medicare Other | Admitting: Family Medicine

## 2019-02-13 VITALS — BP 123/63 | HR 67

## 2019-02-13 DIAGNOSIS — F99 Mental disorder, not otherwise specified: Secondary | ICD-10-CM

## 2019-02-13 DIAGNOSIS — F411 Generalized anxiety disorder: Secondary | ICD-10-CM | POA: Diagnosis not present

## 2019-02-13 DIAGNOSIS — I10 Essential (primary) hypertension: Secondary | ICD-10-CM | POA: Diagnosis not present

## 2019-02-13 DIAGNOSIS — E118 Type 2 diabetes mellitus with unspecified complications: Secondary | ICD-10-CM | POA: Diagnosis not present

## 2019-02-13 DIAGNOSIS — E039 Hypothyroidism, unspecified: Secondary | ICD-10-CM

## 2019-02-13 DIAGNOSIS — F5105 Insomnia due to other mental disorder: Secondary | ICD-10-CM

## 2019-02-13 DIAGNOSIS — I2510 Atherosclerotic heart disease of native coronary artery without angina pectoris: Secondary | ICD-10-CM

## 2019-02-13 MED ORDER — ALPRAZOLAM 0.25 MG PO TABS: 0.2500 mg | ORAL_TABLET | Freq: Every evening | ORAL | 1 refills | Status: AC | PRN

## 2019-02-13 NOTE — Progress Notes (Addendum)
Virtual Visit via Video Note  I connected with pt on 02/13/19 at  4:00 PM EST by a video enabled telemedicine application and verified that I am speaking with the correct person using two identifiers.  Location patient: home Location provider:work or home office Persons participating in the virtual visit: patient, provider  I discussed the limitations of evaluation and management by telemedicine and the availability of in person appointments. The patient expressed understanding and agreed to proceed.  Telemedicine visit is a necessity given the COVID-19 restrictions in place at the current time.  HPI: 85 y/o WF being seen for 3 mo f/u DM, HTN, chronic anxiety, and hypothyroidism. She got lab panel 02/06/19 and we reviewed these in detail today.  Feeling pretty good except for the stress in her life is signif increased due to husbands progressive dementia and cancer.  She takes care of him and sometimes she gets real frustrated with him. Takes alpraz every night, rare dose during the day.  Taking cymbalta 60mg  qd and remeron 1/2 15mg  tab qd. No signif depressed mood. PMP AWARE reviewed today: most recent alpraz rx filled 12/20/18, #30, rx by me.  No red flags.  HTN: home bp's consistently <130/80.  DM: home glucoses usually 140s avg. Decreased sensation as well as paresthesias both feet, has ulcer on bottom of 3rd to R foot. See podiatry.  Needs custom/diabetic shoes.  Hypothyroidism: taking T4 correctly every day.  ROS: no CP, no SOB, no wheezing, no cough, no dizziness, no HAs, no rashes, no melena/hematochezia.  No polyuria or polydipsia.  +chronic LBP.  Past Medical History:  Diagnosis Date  . Abdominal pain, right lower quadrant   . Cataracts, both eyes    soon to have cataract surg as of 03/2015  . Chronic diastolic heart failure ([place]) 2019  . Diabetes mellitus with complication (HCC)    DPN.  NO diab retinopathy as of 06/2017 eye exam.  . Diabetic foot ulcer ([place]) 11/2018    R 3rd toe  . Diabetic peripheral neuropathy associated with type 2 diabetes mellitus ([place])    uncomplicated diabetic ulcer R 3rd toe-->MRI both feet Neg for osteo 10/2018  . Diverticulosis of colon    noted on colonoscopy 2008  . Dyspnea    a. 09/2011 CTA Chest: No PE, Ca2+ cors;  b. 09/2011 R&L heart Cath: Relatively nl R heart pressures, nl co/ci, nonobs cad, nl EF.  12/2015 stress test normal, echo with grd II DD--likely explains DOE.  Worsening chronic DOE 2019-->cath w/no agiograph signif CAD.  Pulm HTN/DD.  EF 65%.  Cards started lasix.  Marland Kitchen External hemorrhoid   . Fatigue    w/ ? excessive daytime somnolence; ? OSA--eval by Dr. Halford Chessman 06/2016, home sleep study ordered.  . Former smoker    30 pack-yrs, quit 2005  . Heart murmur    hx of  . Hx of adenomatous polyp of colon 08/25/2016   07/2016 no recall  . Hyperlipemia, mixed    trigs remain elevated (200s-300s range), but LDL at goal.  . Hypertension   . Hypertensive heart disease 2019  . Hypothyroidism   . IBS (irritable bowel syndrome)    IBS-D (Dr. Carlean Purl) + ? worsened by GI side effect of metformin.  NO pancreatic insufficiency.  As per 09/2017 GI f/u, plan is to continue dicyclomine + f/u with them on prn basis  . Interstitial cystitis   . Liver function study, abnormal   . Lumbar degenerative disc disease    Lumb spondylosis &  facet-mediated pain per Dr. Nelva Bush.  Has gotten repeated LB injections.  Eval by Dr. Tonita Cong 03/2017.  Spinal stenosis without neurogenic claudication.  RF neurotomy 05/2017 and 01/2018->on L and R L3 and L4 medial branch and L5 dorsal ramus nerves.  . Lumbar degenerative disc disease    Dr. Rolena Infante eval 11/2018->recommended against any surgery. Pt to see Dr. Ronnald Ramp for 2nd opinion.  . Lumbar scoliosis    lumbosacral spondylosis w/out myelopathy (radiofrequency ablation/lesioning procedure planned for 05/2017).  . Macular degeneration    OS exudative (avastin treatments).  Advanced nonexudative OD, monitor.  AREDS  .  Macular hole 07/05/2017   OD; surgically repaired 07/2017.  . Menopausal syndrome    restarted estradiol 2016  . Morton's neuroma   . Obesity, Class II, BMI 35-39.9   . OSA on CPAP 07/2016   HST 07/27/16 >> AHI 6.2, SaO2 low 79%. Did not tolerate CPAP.  Pt to try again as of 12/2017.  Compliant with CPAP as of 02/2018 pulm f/u.  . Osteoarthritis, multiple sites 2019   HIPs->surgery NOT recommended ([facility]).  +bilat feet.  . Other specified gastritis without mention of hemorrhage    hx of antral gastritis on EGD 05/2009  . Parkinson's disease ([place]) 2020   Carb/lev started by Dr. Carles Collet 08/2018  . Peripheral neuropathy    ? DPN?--per EMG 11/2012.  Intolerant of gabapentin, failed pamelor.  Radiofrequency neurotomy 12/11/14 helped LB pain (Dr. Nelva Bush)  . Pneumonia   . RLS (restless legs syndrome)   . Tarsal tunnel syndrome   . Trochanteric bursitis of both hips 2019   Injections: [facility]  . Unspecified venous (peripheral) insufficiency     Past Surgical History:  Procedure Laterality Date  . ANKLE SURGERY     bilateral  . CARDIAC CATHETERIZATION  09/2011;11/2017   2013: Nonobstructive CAD. 2019: mild pulmonary hypertension. LV end diastolic pressure is moderately elevated.  EF >65%.  DD/hypertensive heart dz-->BP control and add ASA 81mg  + lasix qd.  . CARDIOVASCULAR STRESS TEST  01/04/2016   Normal stress nuclear study with no ischemia or infarction; EF 82 with normal wall motion  . CATARACT EXTRACTION    . COLONOSCOPY  2002/2005, 11/14/06, and 05/2009   polyps 2002 but none 2005 or 2008 or 2011.  Repeat 08/17/16 for diarrhea: one diminutive adenoma + small polyps removed, diverticulosis noted--no further colonoscopies needed (Dr. Carlean Purl).  . CT angiogram chest  11/05/2017   NEG for PE.  Marland Kitchen DEXA  10/2016   NORMAL--consider repeat 2 yrs.  . ESOPHAGOGASTRODUODENOSCOPY  05/2009   Moderate antral gastritis  . FOOT SURGERY     R foot bunionectomy and arthroplasty of digits R foot.   Arthroplasty digits 3 and 4 L foot.  Marland Kitchen HAMMER TOE SURGERY     bilateral  . left tarsal tunnel release     sept '11 (Dr Beola Cord)  . PARTIAL KNEE ARTHROPLASTY Right 11/06/2016   Procedure: UNICOMPARTMENTAL RIGHT KNEE- Medially;  Surgeon: Paralee Cancel, MD;  Location: [facility];  Service: Orthopedics;  Laterality: Right;  90 mins  . PFTs  12/05/2017   no obstruction, mild restriction. No reversibility. Moderate DLCO defect --continue prn albut  . radiofrequency neurotomy  06/18/2015   bilat L3-4 medial branch nerve and bilat L5 dorsal ramus nerve.(Dr. Nelva Bush)  . RIGHT/LEFT HEART CATH AND CORONARY ANGIOGRAPHY N/A 12/07/2017   Procedure: RIGHT/LEFT HEART CATH AND CORONARY ANGIOGRAPHY;  Surgeon: Leonie Man, MD;  Location: [facility];  Service: Cardiovascular;  Laterality: N/A;  .  TONSILLECTOMY    . TOTAL ABDOMINAL HYSTERECTOMY W/ BILATERAL SALPINGOOPHORECTOMY    . TRANSTHORACIC ECHOCARDIOGRAM  08/2011; 2017   2013 Grade I DD; 2017 EF 60-65%, normal wall motion, grd II DD.    Family History  Problem Relation Age of Onset  . Cancer Mother        Breast  . Breast cancer Mother   . Hypertension Father   . Hyperlipidemia Father   . Diabetes Maternal Grandfather   . Mental illness Brother   . Colon cancer Neg Hx     SOCIAL HX:  Social History   Socioeconomic History  . Marital status: Married    Spouse name: Not on file  . Number of children: Not on file  . Years of education: Not on file  . Highest education level: Not on file  Occupational History  . Not on file  Social Needs  . Financial resource strain: Not on file  . Food insecurity    Worry: Not on file    Inability: Not on file  . Transportation needs    Medical: Not on file    Non-medical: Not on file  Tobacco Use  . Smoking status: Former Smoker    Packs/day: 1.00    Years: 35.00    Pack years: 35.00    Types: Cigarettes    Quit date: 03/27/2002    Years since quitting: 16.8  . Smokeless tobacco: Never Used   Substance and Sexual Activity  . Alcohol use: No    Alcohol/week: 0.0 standard drinks  . Drug use: No  . Sexual activity: Not Currently  Lifestyle  . Physical activity    Days per week: Not on file    Minutes per session: Not on file  . Stress: Not on file  Relationships  . Social Herbalist on phone: Not on file    Gets together: Not on file    Attends religious service: Not on file    Active member of club or organization: Not on file    Attends meetings of clubs or organizations: Not on file    Relationship status: Not on file  Other Topics Concern  . Not on file  Social History Narrative   Married '57   3 sons- '60, '61, '64, grandchildren 73 (7 girls, 1 boy)   Lives independently with husband   Patient is a former smoker: quit 2004.   Alcohol use- yes socially not in years.   Illicit Drug use- no   Occupation: Biomedical scientist     Current Outpatient Medications:  .  ALPRAZolam (XANAX) 0.25 MG tablet, Take 1 tablet (0.25 mg total) by mouth at bedtime as needed for anxiety., Disp: 30 tablet, Rfl: 1 .  aspirin EC 81 MG tablet, Take 81 mg by mouth daily., Disp: , Rfl:  .  atorvastatin (LIPITOR) 20 MG tablet, TAKE ONE TABLET BY MOUTH DAILY, Disp: 90 tablet, Rfl: 1 .  calcium carbonate (CALCIUM 600) 600 MG TABS tablet, Take 600 mg by mouth daily. , Disp: , Rfl:  .  carbidopa-levodopa (SINEMET IR) 25-100 MG tablet, TAKE ONE TABLET BY MOUTH THREE TIMES DAILY, Disp: 270 tablet, Rfl: 1 .  celecoxib (CELEBREX) 200 MG capsule, TAKE ONE CAPSULE BY MOUTH DAILY, Disp: 90 capsule, Rfl: 1 .  diclofenac sodium (VOLTAREN) 1 % GEL, Apply 2 g topically 4 (four) times daily. , Disp: , Rfl: 1 .  DULoxetine (CYMBALTA) 60 MG capsule, TAKE ONE CAPSULE BY MOUTH EVERY DAY, Disp: 90  capsule, Rfl: 0 .  estradiol (ESTRACE) 0.5 MG tablet, Take 0.5 mg by mouth at bedtime., Disp: , Rfl:  .  ferrous sulfate 325 (65 FE) MG tablet, Take 1 tablet (325 mg total) by mouth 2 (two) times daily with  a meal., Disp: 30 tablet, Rfl: 0 .  furosemide (LASIX) 20 MG tablet, 1 tab po qd PRN increase in LE edema, Disp: 45 tablet, Rfl: 3 .  glucose blood test strip, Use to check blood once daily, Disp: 100 each, Rfl: 11 .  glucose blood test strip, OneTouch Verio test strips  USE TO CHECK BLOOD ONCE DAILY, Disp: , Rfl:  .  Lancets 30G MISC, Use to check blood sugar once daily, Disp: 100 each, Rfl: 11 .  levothyroxine (SYNTHROID) 25 MCG tablet, TAKE ONE TABLET BY MOUTH DAILY, Disp: 90 tablet, Rfl: 0 .  loratadine (CLARITIN) 10 MG tablet, Take 10 mg by mouth daily as needed for allergies., Disp: , Rfl:  .  losartan-hydrochlorothiazide (HYZAAR) 50-12.5 MG tablet, TAKE ONE TABLET BY MOUTH EVERY DAY, Disp: 90 tablet, Rfl: 0 .  metoprolol tartrate (LOPRESSOR) 50 MG tablet, TAKE ONE TABLET BY MOUTH TWICE DAILY, Disp: 180 tablet, Rfl: 1 .  mirtazapine (REMERON) 15 MG tablet, TAKE 1/2 TABLET BY MOUTH AT BEDTIME, Disp: 45 tablet, Rfl: 1 .  Multiple Vitamins-Minerals (MULTIVITAMIN ADULT PO), Take 1 tablet by mouth daily. , Disp: , Rfl:  .  Multiple Vitamins-Minerals (PRESERVISION AREDS 2 PO), Take 1 tablet by mouth daily. , Disp: , Rfl:  .  MYRBETRIQ 50 MG TB24 tablet, Take 50 mg by mouth daily., Disp: , Rfl:  .  omeprazole (PRILOSEC) 20 MG capsule, Take 1 capsule (20 mg total) by mouth daily., Disp: 90 capsule, Rfl: 1 .  oxyCODONE-acetaminophen (PERCOCET/ROXICET) 5-325 MG tablet, Take 1 tablet by mouth 4 (four) times daily as needed., Disp: , Rfl:  .  pioglitazone (ACTOS) 30 MG tablet, Take 1 tablet (30 mg total) by mouth daily., Disp: 30 tablet, Rfl: 0 .  potassium chloride SA (KLOR-CON) 20 MEQ tablet, TAKE ONE TABLET BY MOUTH EVERY DAY, Disp: 90 tablet, Rfl: 1 .  pregabalin (LYRICA) 50 MG capsule, TAKE ONE CAPSULE BY MOUTH TWICE DAILY, Disp: 180 capsule, Rfl: 1 .  tiZANidine (ZANAFLEX) 4 MG tablet, Take 4 mg by mouth every 8 (eight) hours as needed for muscle spasms. , Disp: , Rfl: 1 .  dicyclomine (BENTYL) 10  MG capsule, Take 1 tab 2-3 times daily for diarrhea/urgency. (Patient not taking: Reported on 02/13/2019), Disp: 90 capsule, Rfl: 6 .  HYDROcodone-acetaminophen (NORCO/VICODIN) 5-325 MG tablet, Take 1 tablet by mouth 3 (three) times daily as needed., Disp: , Rfl: 0 .  ondansetron (ZOFRAN) 4 MG tablet, Take 1 tablet by mouth as needed., Disp: , Rfl:  .  oxybutynin (DITROPAN-XL) 10 MG 24 hr tablet, Take 1 tablet (10 mg total) by mouth daily as needed (urine frequency). (Patient not taking: Reported on 02/13/2019), Disp: 30 tablet, Rfl: 6  EXAM:  VITALS per patient if applicable: R foot 3rd toe plantar surface with shallow pinkish ulceration with surrounding callus.  GENERAL: alert, oriented, appears well and in no acute distress  HEENT: atraumatic, conjunttiva clear, no obvious abnormalities on inspection of external nose and ears  NECK: normal movements of the head and neck  LUNGS: on inspection no signs of respiratory distress, breathing rate appears normal, no obvious gross SOB, gasping or wheezing  CV: no obvious cyanosis  MS: moves all visible extremities without noticeable abnormality  PSYCH/NEURO:  pleasant and cooperative, no obvious depression or anxiety, speech and thought processing grossly intact  LABS: none today  Lab Results  Component Value Date   TSH 3.45 02/06/2019   Lab Results  Component Value Date   WBC 8.9 05/09/2018   HGB 11.1 05/09/2018   HCT 32.5 (L) 05/09/2018   MCV 92 05/09/2018   PLT 327 05/09/2018   Lab Results  Component Value Date   CREATININE 0.75 02/06/2019   BUN 20 02/06/2019   NA 140 02/06/2019   K 3.8 02/06/2019   CL 100 02/06/2019   CO2 29 02/06/2019   Lab Results  Component Value Date   ALT 21 02/06/2019   AST 17 02/06/2019   ALKPHOS 98 02/06/2019   BILITOT 0.4 02/06/2019   Lab Results  Component Value Date   CHOL 161 02/06/2019   Lab Results  Component Value Date   HDL 44.70 02/06/2019   Lab Results  Component Value  Date   LDLCALC 52 05/09/2018   Lab Results  Component Value Date   TRIG 307.0 (H) 02/06/2019   Lab Results  Component Value Date   CHOLHDL 4 02/06/2019   Lab Results  Component Value Date   HGBA1C 6.5 02/06/2019   ASSESSMENT AND PLAN:  Discussed the following assessment and plan:  1) DM 2 w/complication: has DPN and current R 3rd toe ulcer for which she is currently seeing ortho. She does need custom diabetic orthotics/extra depth diabetic shoes. Recent HbA1c 6.5%. Continue pioglit 30mg  qd and diabetic diet.  2) HTN: The current medical regimen is effective;  continue present plan and medications. Lytes/cr good 1 wk ago.  3) GAD/insomnia: doing pretty good considering the stressful issues she is dealing with. No med changes.  Encouraged her to go ahead and take a daytime alpraz dose prn.  4) Chronic LBP->per Dr. Nelva Bush.  5) Parkinson's dz: sounds like she is not sure if sinemet is helping so far. She has f/u with Dr. Carles Collet again pretty soon.   6) Hypothyroidism: compliant with med and taking it correctly. TSH normal 1 wk ago.  I discussed the assessment and treatment plan with the patient. The patient was provided an opportunity to ask questions and all were answered. The patient agreed with the plan and demonstrated an understanding of the instructions.   The patient was advised to call back or seek an in-person evaluation if the symptoms worsen or if the condition fails to improve as anticipated.  F/u: 3 mo  Signed:  Crissie Sickles, MD           02/13/2019

## 2019-02-14 DIAGNOSIS — M47817 Spondylosis without myelopathy or radiculopathy, lumbosacral region: Secondary | ICD-10-CM | POA: Diagnosis not present

## 2019-02-18 ENCOUNTER — Encounter (HOSPITAL_COMMUNITY): Payer: Medicare Other

## 2019-02-18 ENCOUNTER — Encounter: Payer: Medicare Other | Admitting: Vascular Surgery

## 2019-02-21 ENCOUNTER — Other Ambulatory Visit: Payer: Self-pay

## 2019-02-21 DIAGNOSIS — E13621 Other specified diabetes mellitus with foot ulcer: Secondary | ICD-10-CM

## 2019-02-24 ENCOUNTER — Telehealth (HOSPITAL_COMMUNITY): Payer: Self-pay | Admitting: *Deleted

## 2019-02-24 DIAGNOSIS — M79671 Pain in right foot: Secondary | ICD-10-CM | POA: Diagnosis not present

## 2019-02-24 DIAGNOSIS — E13621 Other specified diabetes mellitus with foot ulcer: Secondary | ICD-10-CM | POA: Diagnosis not present

## 2019-02-24 DIAGNOSIS — M79672 Pain in left foot: Secondary | ICD-10-CM | POA: Diagnosis not present

## 2019-02-24 NOTE — Telephone Encounter (Signed)

## 2019-02-25 ENCOUNTER — Encounter: Payer: Self-pay | Admitting: Vascular Surgery

## 2019-02-25 ENCOUNTER — Telehealth: Payer: Self-pay

## 2019-02-25 ENCOUNTER — Other Ambulatory Visit: Payer: Self-pay | Admitting: Family Medicine

## 2019-02-25 ENCOUNTER — Ambulatory Visit (INDEPENDENT_AMBULATORY_CARE_PROVIDER_SITE_OTHER): Payer: Medicare Other | Admitting: Vascular Surgery

## 2019-02-25 ENCOUNTER — Other Ambulatory Visit: Payer: Self-pay

## 2019-02-25 ENCOUNTER — Ambulatory Visit (HOSPITAL_COMMUNITY)
Admission: RE | Admit: 2019-02-25 | Discharge: 2019-02-25 | Disposition: A | Discharge status: Home / Self Care | Payer: Medicare Other | Source: Ambulatory Visit | Attending: Family | Admitting: Family

## 2019-02-25 VITALS — BP 119/70 | HR 67 | Temp 97.9°F | Resp 20 | Ht 62.0 in | Wt 205.3 lb

## 2019-02-25 DIAGNOSIS — L97509 Non-pressure chronic ulcer of other part of unspecified foot with unspecified severity: Secondary | ICD-10-CM | POA: Diagnosis not present

## 2019-02-25 DIAGNOSIS — E13621 Other specified diabetes mellitus with foot ulcer: Secondary | ICD-10-CM

## 2019-02-25 DIAGNOSIS — I2510 Atherosclerotic heart disease of native coronary artery without angina pectoris: Secondary | ICD-10-CM

## 2019-02-25 HISTORY — PX: OTHER SURGICAL HISTORY: SHX169

## 2019-02-25 MED ORDER — PIOGLITAZONE HCL 30 MG PO TABS: 30.0000 mg | ORAL_TABLET | Freq: Every day | ORAL | 0 refills | Status: DC

## 2019-02-25 NOTE — Progress Notes (Signed)
Vascular and Vein Specialist of [facility]  Patient name: Katherine Best MRN: [id number] DOB: [birth year] Sex: female  REASON FOR CONSULT: Evaluate arterial flow right lower extremity  HPI: Katherine Best is a 85 y.o. female, who is here today for evaluation of lower extremity arterial flow.  She has a long history of diabetes with prior ulceration.  She has had these in the past which have healed.  She has an area of ulceration on her right third toe currently with slow healing.  She has had prior surgery on her fifth toes for straightening in the past with no difficulty healing.  MRI in August 2020 showed no evidence of osteomyelitis.  Past Medical History:  Diagnosis Date  . Abdominal pain, right lower quadrant   . Cataracts, both eyes    soon to have cataract surg as of 03/2015  . Chronic diastolic heart failure ([place]) 2019  . Diabetes mellitus with complication (HCC)    DPN.  NO diab retinopathy as of 06/2017 eye exam.  . Diabetic foot ulcer ([place]) 11/2018   R 3rd toe  . Diabetic peripheral neuropathy associated with type 2 diabetes mellitus ([place])    uncomplicated diabetic ulcer R 3rd toe-->MRI both feet Neg for osteo 10/2018  . Diverticulosis of colon    noted on colonoscopy 2008  . Dyspnea    a. 09/2011 CTA Chest: No PE, Ca2+ cors;  b. 09/2011 R&L heart Cath: Relatively nl R heart pressures, nl co/ci, nonobs cad, nl EF.  12/2015 stress test normal, echo with grd II DD--likely explains DOE.  Worsening chronic DOE 2019-->cath w/no agiograph signif CAD.  Pulm HTN/DD.  EF 65%.  Cards started lasix.  Marland Kitchen External hemorrhoid   . Fatigue    w/ ? excessive daytime somnolence; ? OSA--eval by Dr. Halford Chessman 06/2016, home sleep study ordered.  . Former smoker    30 pack-yrs, quit 2005  . Heart murmur    hx of  . Hx of adenomatous polyp of colon 08/25/2016   07/2016 no recall  . Hyperlipemia, mixed    trigs remain elevated (200s-300s range), but LDL at goal.  .  Hypertension   . Hypertensive heart disease 2019  . Hypothyroidism   . IBS (irritable bowel syndrome)    IBS-D (Dr. Carlean Purl) + ? worsened by GI side effect of metformin.  NO pancreatic insufficiency.  As per 09/2017 GI f/u, plan is to continue dicyclomine + f/u with them on prn basis  . Interstitial cystitis   . Liver function study, abnormal   . Lumbar degenerative disc disease    Lumb spondylosis & facet-mediated pain per Dr. Nelva Bush.  Has gotten repeated LB injections.  Eval by Dr. Tonita Cong 03/2017.  Spinal stenosis without neurogenic claudication.  RF neurotomy 05/2017 and 01/2018->on L and R L3 and L4 medial branch and L5 dorsal ramus nerves.  . Lumbar degenerative disc disease    Dr. Rolena Infante eval 11/2018->recommended against any surgery. Pt to see Dr. Ronnald Ramp for 2nd opinion.  . Lumbar scoliosis    lumbosacral spondylosis w/out myelopathy (radiofrequency ablation/lesioning procedure planned for 05/2017).  . Macular degeneration    OS exudative (avastin treatments).  Advanced nonexudative OD, monitor.  AREDS  . Macular hole 07/05/2017   OD; surgically repaired 07/2017.  . Menopausal syndrome    restarted estradiol 2016  . Morton's neuroma   . Obesity, Class II, BMI 35-39.9   . OSA on CPAP 07/2016   HST 07/27/16 >> AHI 6.2, SaO2 low 79%. Did  not tolerate CPAP.  Pt to try again as of 12/2017.  Compliant with CPAP as of 02/2018 pulm f/u.  . Osteoarthritis, multiple sites 2019   HIPs->surgery NOT recommended ([facility]).  +bilat feet.  . Other specified gastritis without mention of hemorrhage    hx of antral gastritis on EGD 05/2009  . Parkinson's disease ([place]) 2020   Carb/lev started by Dr. Carles Collet 08/2018  . Peripheral neuropathy    ? DPN?--per EMG 11/2012.  Intolerant of gabapentin, failed pamelor.  Radiofrequency neurotomy 12/11/14 helped LB pain (Dr. Nelva Bush)  . Pneumonia   . RLS (restless legs syndrome)   . Tarsal tunnel syndrome   . Trochanteric bursitis of both hips 2019   Injections:  [facility]  . Unspecified venous (peripheral) insufficiency     Family History  Problem Relation Age of Onset  . Cancer Mother        Breast  . Breast cancer Mother   . Hypertension Father   . Hyperlipidemia Father   . Diabetes Maternal Grandfather   . Mental illness Brother   . Colon cancer Neg Hx     SOCIAL HISTORY: Social History   Socioeconomic History  . Marital status: Married    Spouse name: Not on file  . Number of children: Not on file  . Years of education: Not on file  . Highest education level: Not on file  Occupational History  . Not on file  Social Needs  . Financial resource strain: Not on file  . Food insecurity    Worry: Not on file    Inability: Not on file  . Transportation needs    Medical: Not on file    Non-medical: Not on file  Tobacco Use  . Smoking status: Former Smoker    Packs/day: 1.00    Years: 35.00    Pack years: 35.00    Types: Cigarettes    Quit date: 03/27/2002    Years since quitting: 16.9  . Smokeless tobacco: Never Used  Substance and Sexual Activity  . Alcohol use: No    Alcohol/week: 0.0 standard drinks  . Drug use: No  . Sexual activity: Not Currently  Lifestyle  . Physical activity    Days per week: Not on file    Minutes per session: Not on file  . Stress: Not on file  Relationships  . Social Herbalist on phone: Not on file    Gets together: Not on file    Attends religious service: Not on file    Active member of club or organization: Not on file    Attends meetings of clubs or organizations: Not on file    Relationship status: Not on file  . Intimate partner violence    Fear of current or ex partner: Not on file    Emotionally abused: Not on file    Physically abused: Not on file    Forced sexual activity: Not on file  Other Topics Concern  . Not on file  Social History Narrative   Married '57   3 sons- '60, '61, '64, grandchildren 36 (7 girls, 1 boy)   Lives independently with husband    Patient is a former smoker: quit 2004.   Alcohol use- yes socially not in years.   Illicit Drug use- no   Occupation: Biomedical scientist    Allergies  Allergen Reactions  . Gabapentin Itching  . Sulfonamide Derivatives Nausea Only    REACTION: Nausea    Current Outpatient Medications  Medication Sig Dispense Refill  . ALPRAZolam (XANAX) 0.25 MG tablet Take 1 tablet (0.25 mg total) by mouth at bedtime as needed for anxiety. 30 tablet 1  . aspirin EC 81 MG tablet Take 81 mg by mouth daily.    Marland Kitchen atorvastatin (LIPITOR) 20 MG tablet TAKE ONE TABLET BY MOUTH DAILY 90 tablet 1  . calcium carbonate (CALCIUM 600) 600 MG TABS tablet Take 600 mg by mouth daily.     . carbidopa-levodopa (SINEMET IR) 25-100 MG tablet TAKE ONE TABLET BY MOUTH THREE TIMES DAILY 270 tablet 1  . celecoxib (CELEBREX) 200 MG capsule TAKE ONE CAPSULE BY MOUTH DAILY 90 capsule 1  . diclofenac sodium (VOLTAREN) 1 % GEL Apply 2 g topically 4 (four) times daily.   1  . doxycycline (VIBRA-TABS) 100 MG tablet Take 100 mg by mouth 2 (two) times daily.    . DULoxetine (CYMBALTA) 60 MG capsule TAKE ONE CAPSULE BY MOUTH EVERY DAY 90 capsule 0  . estradiol (ESTRACE) 0.5 MG tablet Take 0.5 mg by mouth at bedtime.    . ferrous sulfate 325 (65 FE) MG tablet Take 1 tablet (325 mg total) by mouth 2 (two) times daily with a meal. 30 tablet 0  . furosemide (LASIX) 20 MG tablet 1 tab po qd PRN increase in LE edema 45 tablet 3  . glucose blood test strip Use to check blood once daily 100 each 11  . glucose blood test strip OneTouch Verio test strips  USE TO CHECK BLOOD ONCE DAILY    . HYDROcodone-acetaminophen (NORCO/VICODIN) 5-325 MG tablet Take 1 tablet by mouth 3 (three) times daily as needed.  0  . Lancets 30G MISC Use to check blood sugar once daily 100 each 11  . levothyroxine (SYNTHROID) 25 MCG tablet TAKE ONE TABLET BY MOUTH DAILY 90 tablet 0  . loratadine (CLARITIN) 10 MG tablet Take 10 mg by mouth daily as needed for allergies.     Marland Kitchen losartan-hydrochlorothiazide (HYZAAR) 50-12.5 MG tablet TAKE ONE TABLET BY MOUTH EVERY DAY 90 tablet 0  . metoprolol tartrate (LOPRESSOR) 50 MG tablet TAKE ONE TABLET BY MOUTH TWICE DAILY 180 tablet 1  . mirtazapine (REMERON) 15 MG tablet TAKE 1/2 TABLET BY MOUTH AT BEDTIME 45 tablet 1  . Multiple Vitamins-Minerals (MULTIVITAMIN ADULT PO) Take 1 tablet by mouth daily.     . Multiple Vitamins-Minerals (PRESERVISION AREDS 2 PO) Take 1 tablet by mouth daily.     Marland Kitchen MYRBETRIQ 50 MG TB24 tablet Take 50 mg by mouth daily.    Marland Kitchen omeprazole (PRILOSEC) 20 MG capsule Take 1 capsule (20 mg total) by mouth daily. 90 capsule 1  . ondansetron (ZOFRAN) 4 MG tablet Take 1 tablet by mouth as needed.    Marland Kitchen oxyCODONE-acetaminophen (PERCOCET/ROXICET) 5-325 MG tablet Take 1 tablet by mouth 4 (four) times daily as needed.    . pioglitazone (ACTOS) 30 MG tablet Take 1 tablet (30 mg total) by mouth daily. 30 tablet 0  . potassium chloride SA (KLOR-CON) 20 MEQ tablet TAKE ONE TABLET BY MOUTH EVERY DAY 90 tablet 1  . pregabalin (LYRICA) 50 MG capsule TAKE ONE CAPSULE BY MOUTH TWICE DAILY 180 capsule 1  . tiZANidine (ZANAFLEX) 4 MG tablet Take 4 mg by mouth every 8 (eight) hours as needed for muscle spasms.   1  . dicyclomine (BENTYL) 10 MG capsule Take 1 tab 2-3 times daily for diarrhea/urgency. (Patient not taking: Reported on 02/13/2019) 90 capsule 6  . oxybutynin (DITROPAN-XL) 10 MG 24 hr  tablet Take 1 tablet (10 mg total) by mouth daily as needed (urine frequency). (Patient not taking: Reported on 02/13/2019) 30 tablet 6   No current facility-administered medications for this visit.     REVIEW OF SYSTEMS:  [X]  denotes positive finding, [ ]  denotes negative finding Cardiac  Comments:  Chest pain or chest pressure:    Shortness of breath upon exertion:    Short of breath when lying flat:    Irregular heart rhythm:        Vascular    Pain in calf, thigh, or hip brought on by ambulation: x   Pain in feet at  night that wakes you up from your sleep:     Blood clot in your veins:    Leg swelling:         Pulmonary    Oxygen at home:    Productive cough:     Wheezing:         Neurologic    Sudden weakness in arms or legs:     Sudden numbness in arms or legs:     Sudden onset of difficulty speaking or slurred speech:    Temporary loss of vision in one eye:     Problems with dizziness:         Gastrointestinal    Blood in stool:     Vomited blood:         Genitourinary    Burning when urinating:     Blood in urine:        Psychiatric    Major depression:         Hematologic    Bleeding problems:    Problems with blood clotting too easily:        Skin    Rashes or ulcers:        Constitutional    Fever or chills:      PHYSICAL EXAM: Vitals:   02/25/19 1432  BP: 119/70  Pulse: 67  Resp: 20  Temp: 97.9 F (36.6 C)  SpO2: 98%  Weight: 205 lb 4.8 oz (93.1 kg)  Height: 5\' 2"  (1.575 m)    GENERAL: The patient is a well-nourished female, in no acute distress. The vital signs are documented above. CARDIOVASCULAR: 2+ radial and 2+ dorsalis pedis pulses bilaterally PULMONARY: There is good air exchange  ABDOMEN: Soft and non-tender  MUSCULOSKELETAL: There are no major deformities or cyanosis. NEUROLOGIC: No focal weakness or paresthesias are detected. SKIN: Superficial ulceration on third toe.  Some deformity of second toe rubbing this area. PSYCHIATRIC: The patient has a normal affect.  DATA:  Lower extremity noninvasive arterial studies reveal normal ankle arm index and normal triphasic waveforms bilaterally  MEDICAL ISSUES: Discussed these findings with the patient.  She has no evidence of arterial insufficiency.  Does have neuropathy and pressure issues regarding her feet.  She will continue to follow-up with Dr. Doran Durand for local care and see [place] again on an as-needed basis   Rosetta Posner, MD [facility] Vascular and Vein Specialists of [facility] Tel [phone] Pager [phone]

## 2019-02-25 NOTE — Telephone Encounter (Signed)
Received voicemail from pt's pharmacy that pt was requesting 90 d/s instead of 30. She had been getting 90 in the past from PCP. New Rx sent for 90 day supply.

## 2019-02-28 ENCOUNTER — Encounter: Payer: Self-pay | Admitting: Family Medicine

## 2019-03-10 ENCOUNTER — Encounter: Payer: Self-pay | Admitting: Family Medicine

## 2019-03-19 ENCOUNTER — Other Ambulatory Visit: Payer: Self-pay | Admitting: Family Medicine

## 2019-03-19 ENCOUNTER — Telehealth: Payer: Self-pay

## 2019-03-19 ENCOUNTER — Other Ambulatory Visit: Payer: Self-pay

## 2019-03-19 DIAGNOSIS — I10 Essential (primary) hypertension: Secondary | ICD-10-CM

## 2019-03-19 MED ORDER — LOSARTAN POTASSIUM-HCTZ 50-12.5 MG PO TABS: 1.0000 | ORAL_TABLET | Freq: Every day | ORAL | 0 refills | Status: DC

## 2019-03-19 MED ORDER — ESTRADIOL 0.5 MG PO TABS: 0.5000 mg | ORAL_TABLET | Freq: Every day | ORAL | 3 refills | Status: DC

## 2019-03-19 NOTE — Telephone Encounter (Signed)
RF request for estrace 1mg  LOV: 02/13/19 Next ov: advised to f/u 3 mo. Last written: 05/09/18 (90,3), this med was discontinued due to change in therapy.  She has 0.5mg  dose of this med on her current list. Please advise, thanks.

## 2019-03-19 NOTE — Telephone Encounter (Signed)
OK, I'm sending in the 0.5mg  dose of estradiol.

## 2019-03-19 NOTE — Telephone Encounter (Signed)
Patient was notified regarding Rx.

## 2019-03-27 ENCOUNTER — Other Ambulatory Visit: Payer: Self-pay | Admitting: Family Medicine

## 2019-03-31 DIAGNOSIS — H353222 Exudative age-related macular degeneration, left eye, with inactive choroidal neovascularization: Secondary | ICD-10-CM | POA: Diagnosis not present

## 2019-03-31 DIAGNOSIS — H35372 Puckering of macula, left eye: Secondary | ICD-10-CM | POA: Diagnosis not present

## 2019-03-31 DIAGNOSIS — H04123 Dry eye syndrome of bilateral lacrimal glands: Secondary | ICD-10-CM | POA: Diagnosis not present

## 2019-03-31 DIAGNOSIS — H353113 Nonexudative age-related macular degeneration, right eye, advanced atrophic without subfoveal involvement: Secondary | ICD-10-CM | POA: Diagnosis not present

## 2019-03-31 LAB — HM DIABETES EYE EXAM

## 2019-04-10 ENCOUNTER — Encounter: Payer: Self-pay | Admitting: Family Medicine

## 2019-04-13 ENCOUNTER — Ambulatory Visit: Payer: Medicare Other | Attending: Internal Medicine

## 2019-04-13 DIAGNOSIS — Z23 Encounter for immunization: Secondary | ICD-10-CM | POA: Diagnosis not present

## 2019-04-13 NOTE — Progress Notes (Signed)
   Covid-19 Vaccination Clinic  Name:  Katherine Best    MRN: [id number] DOB: [birth year]  04/13/2019  [name] was observed post Covid-19 immunization for 15 minutes without incidence. She was provided with Vaccine Information Sheet and instruction to access the V-Safe system.   [name] was instructed to call 911 with any severe reactions post vaccine: Marland Kitchen Difficulty breathing  . Swelling of your face and throat  . A fast heartbeat  . A bad rash all over your body  . Dizziness and weakness   Patient was discharged at 10:56.

## 2019-04-16 ENCOUNTER — Telehealth: Payer: Self-pay

## 2019-04-16 DIAGNOSIS — L97519 Non-pressure chronic ulcer of other part of right foot with unspecified severity: Secondary | ICD-10-CM | POA: Diagnosis not present

## 2019-04-16 NOTE — Telephone Encounter (Signed)
Patient dropped off diabetic shoe form from [facility] for completion.

## 2019-04-17 NOTE — Telephone Encounter (Signed)
Placed on PCP desk to review and sign, if appropriate.  

## 2019-04-18 DIAGNOSIS — M47816 Spondylosis without myelopathy or radiculopathy, lumbar region: Secondary | ICD-10-CM | POA: Diagnosis not present

## 2019-04-24 NOTE — Telephone Encounter (Signed)
Form faxed to [place], original still on my desk.

## 2019-04-24 NOTE — Telephone Encounter (Signed)
Form signed.

## 2019-05-02 DIAGNOSIS — M5136 Other intervertebral disc degeneration, lumbar region: Secondary | ICD-10-CM | POA: Diagnosis not present

## 2019-05-02 DIAGNOSIS — Z79891 Long term (current) use of opiate analgesic: Secondary | ICD-10-CM | POA: Diagnosis not present

## 2019-05-04 ENCOUNTER — Ambulatory Visit: Payer: Medicare Other | Attending: Internal Medicine

## 2019-05-04 DIAGNOSIS — Z23 Encounter for immunization: Secondary | ICD-10-CM

## 2019-05-04 NOTE — Progress Notes (Signed)
   Covid-19 Vaccination Clinic  Name:  Katherine Best    MRN: [id number] DOB: [birth year]  05/04/2019  Ms. [name] was observed post Covid-19 immunization for 15 minutes without incidence. She was provided with Vaccine Information Sheet and instruction to access the V-Safe system.   Ms. [name] was instructed to call 911 with any severe reactions post vaccine: Marland Kitchen Difficulty breathing  . Swelling of your face and throat  . A fast heartbeat  . A bad rash all over your body  . Dizziness and weakness    Immunizations Administered    Name Date Dose VIS Date Route   Pfizer COVID-19 Vaccine 05/04/2019 10:03 AM 0.3 mL 03/07/2019 Intramuscular   Manufacturer: [place]   Lot: [id number]   [place]: [id number]

## 2019-05-08 ENCOUNTER — Telehealth: Payer: Self-pay | Admitting: Internal Medicine

## 2019-05-08 NOTE — Telephone Encounter (Signed)

## 2019-05-12 ENCOUNTER — Telehealth: Payer: Medicare Other | Admitting: Internal Medicine

## 2019-05-15 ENCOUNTER — Other Ambulatory Visit: Payer: Self-pay | Admitting: Family Medicine

## 2019-05-16 DIAGNOSIS — M5137 Other intervertebral disc degeneration, lumbosacral region: Secondary | ICD-10-CM | POA: Diagnosis not present

## 2019-05-21 ENCOUNTER — Encounter: Payer: Self-pay | Admitting: Family Medicine

## 2019-05-23 NOTE — Progress Notes (Signed)
Cardiology Office Note   Date:  05/26/2019   ID:  Katherine Best, DOB Apr 15, 1933, MRN [id number]  PCP:  Tammi Sou, MD  Cardiologist:   Dorris Carnes, MD    Pt presents for f/u of CAD and SOB     History of Present Illness: Katherine Best is a 86 y.o. female with a history of mld CAD at [facility] Sept 2019 (20%prox RCA; 35% prox/midLAD; 45% mLC) and elevated R heart pressures PCWP 18 to 24   PA 37/13  RA 12 LVEDP 24  Started on Lasix  I saw the patient in early 2020   Since seen the pt has been busy caring for husband who has dementia and cancer  This fatigues her    Her breathng is OK except for when has mask on  Denies CP  Occcasional LE edema    Current Meds  Medication Sig  . ALPRAZolam (XANAX) 0.25 MG tablet Take 1 tablet (0.25 mg total) by mouth at bedtime as needed for anxiety.  Marland Kitchen aspirin EC 81 MG tablet Take 81 mg by mouth daily.  Marland Kitchen atorvastatin (LIPITOR) 20 MG tablet TAKE ONE TABLET BY MOUTH DAILY  . calcium carbonate (CALCIUM 600) 600 MG TABS tablet Take 600 mg by mouth daily.   . carbidopa-levodopa (SINEMET IR) 25-100 MG tablet TAKE ONE TABLET BY MOUTH THREE TIMES DAILY  . celecoxib (CELEBREX) 200 MG capsule TAKE ONE CAPSULE BY MOUTH DAILY  . diclofenac sodium (VOLTAREN) 1 % GEL Apply 2 g topically 4 (four) times daily.   Marland Kitchen dicyclomine (BENTYL) 10 MG capsule Take 1 tab 2-3 times daily for diarrhea/urgency.  Marland Kitchen doxycycline (VIBRA-TABS) 100 MG tablet Take 100 mg by mouth 2 (two) times daily.  . DULoxetine (CYMBALTA) 60 MG capsule TAKE ONE CAPSULE BY MOUTH EVERY DAY  . estradiol (ESTRACE) 0.5 MG tablet Take 1 tablet (0.5 mg total) by mouth at bedtime.  . ferrous sulfate 325 (65 FE) MG tablet Take 1 tablet (325 mg total) by mouth 2 (two) times daily with a meal.  . furosemide (LASIX) 20 MG tablet 1 tab po qd PRN increase in LE edema  . glucose blood test strip Use to check blood once daily  . glucose blood test strip OneTouch Verio test strips  USE TO CHECK BLOOD ONCE  DAILY  . HYDROcodone-acetaminophen (NORCO/VICODIN) 5-325 MG tablet Take 1 tablet by mouth 3 (three) times daily as needed.  . Lancets 30G MISC Use to check blood sugar once daily  . levothyroxine (SYNTHROID) 25 MCG tablet TAKE ONE TABLET BY MOUTH EVERY DAY  . loratadine (CLARITIN) 10 MG tablet Take 10 mg by mouth daily as needed for allergies.  Marland Kitchen losartan-hydrochlorothiazide (HYZAAR) 50-12.5 MG tablet Take 1 tablet by mouth daily.  . metoprolol tartrate (LOPRESSOR) 50 MG tablet TAKE ONE TABLET BY MOUTH TWICE DAILY  . mirtazapine (REMERON) 15 MG tablet TAKE 1/2 TABLET BY MOUTH AT BEDTIME  . Multiple Vitamins-Minerals (MULTIVITAMIN ADULT PO) Take 1 tablet by mouth daily.   . Multiple Vitamins-Minerals (PRESERVISION AREDS 2 PO) Take 1 tablet by mouth daily.   Marland Kitchen MYRBETRIQ 50 MG TB24 tablet Take 50 mg by mouth daily.  Marland Kitchen omeprazole (PRILOSEC) 20 MG capsule Take 1 capsule (20 mg total) by mouth daily.  . ondansetron (ZOFRAN) 4 MG tablet Take 1 tablet by mouth as needed.  Marland Kitchen oxybutynin (DITROPAN-XL) 10 MG 24 hr tablet Take 1 tablet (10 mg total) by mouth daily as needed (urine frequency).  Marland Kitchen oxyCODONE-acetaminophen (PERCOCET/ROXICET) 5-325  MG tablet Take 1 tablet by mouth 4 (four) times daily as needed.  . pioglitazone (ACTOS) 30 MG tablet Take 1 tablet (30 mg total) by mouth daily.  . potassium chloride SA (KLOR-CON) 20 MEQ tablet TAKE ONE TABLET BY MOUTH EVERY DAY  . pregabalin (LYRICA) 50 MG capsule TAKE ONE CAPSULE BY MOUTH TWICE DAILY  . tiZANidine (ZANAFLEX) 4 MG tablet Take 4 mg by mouth every 8 (eight) hours as needed for muscle spasms.      Allergies:   Gabapentin and Sulfonamide derivatives   Past Medical History:  Diagnosis Date  . Abdominal pain, right lower quadrant   . Cataracts, both eyes    soon to have cataract surg as of 03/2015  . Chronic diastolic heart failure ([place]) 2019  . Diabetes mellitus with complication (HCC)    DPN.  NO diab retinopathy as of 06/2017 eye exam.  .  Diabetic foot ulcer ([place]) 11/2018   R 3rd toe  . Diabetic peripheral neuropathy associated with type 2 diabetes mellitus ([place])    uncomplicated diabetic ulcer R 3rd toe-->MRI both feet Neg for osteo 10/2018  . Diverticulosis of colon    noted on colonoscopy 2008  . Dyspnea    a. 09/2011 CTA Chest: No PE, Ca2+ cors;  b. 09/2011 R&L heart Cath: Relatively nl R heart pressures, nl co/ci, nonobs cad, nl EF.  12/2015 stress test normal, echo with grd II DD--likely explains DOE.  Worsening chronic DOE 2019-->cath w/no agiograph signif CAD.  Pulm HTN/DD.  EF 65%.  Cards started lasix.  Marland Kitchen External hemorrhoid   . Fatigue    w/ ? excessive daytime somnolence; ? OSA--eval by Dr. Halford Chessman 06/2016, home sleep study ordered.  . Former smoker    30 pack-yrs, quit 2005  . Heart murmur    hx of  . Hx of adenomatous polyp of colon 08/25/2016   07/2016 no recall  . Hyperlipemia, mixed    trigs remain elevated (200s-300s range), but LDL at goal.  . Hypertension   . Hypertensive heart disease 2019  . Hypothyroidism   . IBS (irritable bowel syndrome)    IBS-D (Dr. Carlean Purl) + ? worsened by GI side effect of metformin.  NO pancreatic insufficiency.  As per 09/2017 GI f/u, plan is to continue dicyclomine + f/u with them on prn basis  . Interstitial cystitis   . Liver function study, abnormal   . Lumbar degenerative disc disease    Lumb spondylosis & facet-mediated pain per Dr. Nelva Bush.  Has gotten repeated LB injections.  Eval by Dr. Tonita Cong 03/2017.  Spinal stenosis without neurogenic claudication.  RF neurotomy 05/2017 and 01/2018->on L and R L3 and L4 medial branch and L5 dorsal ramus nerves.  . Lumbar degenerative disc disease    Surgery not recommended, Dr. Rolena Infante and Dr. Ronnald Ramp.  Radiofreqency neuronotomy 04/18/19, Dr. Nelva Bush.  . Lumbar scoliosis    lumbosacral spondylosis w/out myelopathy (radiofrequency ablation/lesioning procedure planned for 05/2017).  . Macular degeneration    OS exudative (avastin treatments).   Advanced nonexudative OD, monitor.  AREDS  . Macular hole 07/05/2017   OD; surgically repaired 07/2017.  . Menopausal syndrome    restarted estradiol 2016  . Morton's neuroma   . Obesity, Class II, BMI 35-39.9   . OSA on CPAP 07/2016   HST 07/27/16 >> AHI 6.2, SaO2 low 79%. Did not tolerate CPAP.  Pt to try again as of 12/2017.  Compliant with CPAP as of 02/2018 pulm f/u.  . Osteoarthritis, multiple sites  2019   HIPs->surgery NOT recommended ([facility]).  +bilat feet.  . Other specified gastritis without mention of hemorrhage    hx of antral gastritis on EGD 05/2009  . Parkinson's disease ([place]) 2020   Carb/lev started by Dr. Carles Collet 08/2018  . Peripheral neuropathy    ? DPN?--per EMG 11/2012.  Intolerant of gabapentin, failed pamelor.  Radiofrequency neurotomy 12/11/14 helped LB pain (Dr. Nelva Bush)  . Pneumonia   . RLS (restless legs syndrome)   . Tarsal tunnel syndrome   . Trochanteric bursitis of both hips 2019   Injections: [facility]  . Unspecified venous (peripheral) insufficiency     Past Surgical History:  Procedure Laterality Date  . ABI/Arterial eval  02/25/2019   normal ankle arm index and normal triphasic waveforms bilat->no PAD->reassured by vasc surgeon  . ANKLE SURGERY     bilateral  . CARDIAC CATHETERIZATION  09/2011;11/2017   2013: Nonobstructive CAD. 2019: mild pulmonary hypertension. LV end diastolic pressure is moderately elevated.  EF >65%.  DD/hypertensive heart dz-->BP control and add ASA 81mg  + lasix qd.  . CARDIOVASCULAR STRESS TEST  01/04/2016   Normal stress nuclear study with no ischemia or infarction; EF 82 with normal wall motion  . CATARACT EXTRACTION    . COLONOSCOPY  2002/2005, 11/14/06, and 05/2009   polyps 2002 but none 2005 or 2008 or 2011.  Repeat 08/17/16 for diarrhea: one diminutive adenoma + small polyps removed, diverticulosis noted--no further colonoscopies needed (Dr. Carlean Purl).  . CT angiogram chest  11/05/2017   NEG for PE.  Marland Kitchen DEXA  10/2016    NORMAL--consider repeat 2 yrs.  . ESOPHAGOGASTRODUODENOSCOPY  05/2009   Moderate antral gastritis  . FOOT SURGERY     R foot bunionectomy and arthroplasty of digits R foot.  Arthroplasty digits 3 and 4 L foot.  Marland Kitchen HAMMER TOE SURGERY     bilateral  . left tarsal tunnel release     sept '11 (Dr Beola Cord)  . PARTIAL KNEE ARTHROPLASTY Right 11/06/2016   Procedure: UNICOMPARTMENTAL RIGHT KNEE- Medially;  Surgeon: Paralee Cancel, MD;  Location: [facility];  Service: Orthopedics;  Laterality: Right;  90 mins  . PFTs  12/05/2017   no obstruction, mild restriction. No reversibility. Moderate DLCO defect --continue prn albut  . radiofrequency neurotomy  06/18/2015   bilat L3-4 medial branch nerve and bilat L5 dorsal ramus nerve.(Dr. Nelva Bush)  . RIGHT/LEFT HEART CATH AND CORONARY ANGIOGRAPHY N/A 12/07/2017   Procedure: RIGHT/LEFT HEART CATH AND CORONARY ANGIOGRAPHY;  Surgeon: Leonie Man, MD;  Location: [facility];  Service: Cardiovascular;  Laterality: N/A;  . TONSILLECTOMY    . TOTAL ABDOMINAL HYSTERECTOMY W/ BILATERAL SALPINGOOPHORECTOMY    . TRANSTHORACIC ECHOCARDIOGRAM  08/2011; 2017   2013 Grade I DD; 2017 EF 60-65%, normal wall motion, grd II DD.     Social History:  The patient  reports that she quit smoking about 17 years ago. Her smoking use included cigarettes. She has a 35.00 pack-year smoking history. She has never used smokeless tobacco. She reports that she does not drink alcohol or use drugs.   Family History:  The patient's family history includes Breast cancer in her mother; Cancer in her mother; Diabetes in her maternal grandfather; Hyperlipidemia in her father; Hypertension in her father; Mental illness in her brother.    ROS:  Please see the history of present illness. All other systems are reviewed and  Negative to the above problem except as noted.    PHYSICAL EXAM: VS:  BP (!) 118/56  Pulse 70   Ht 5\' 2"  (1.575 m)   Wt 204 lb 9.6 oz (92.8 kg)   BMI 37.42 kg/m    GEN: Morbidly obese 86 yo , in no acute distress  HEENT: normal  Neck: JVP is normal   No carotid bruits Cardiac: RRR; no murmurs, rubs, or gallops,Tr tp 1+ edema  Respiratory: Relatively clear No wheezes  No rales  GI: soft, nontender, nondistended, + BS  No hepatomegaly  MS: no deformi ty Moving all extremities   Skin: warm and dry,Some erythema to calvs   Neuro:  Strength and sensation are intact Psych: euthymic mood, full affect   EKG:  EKG is ordered today.  SR 70   Ectopic atrial rhytmm   Lipid Panel    Component Value Date/Time   CHOL 161 02/06/2019 0850   CHOL 152 05/09/2018 1550   TRIG 307.0 (H) 02/06/2019 0850   HDL 44.70 02/06/2019 0850   HDL 46 05/09/2018 1550   CHOLHDL 4 02/06/2019 0850   VLDL 61.4 (H) 02/06/2019 0850   LDLCALC 52 05/09/2018 1550   LDLCALC 68 04/17/2017 0803   LDLDIRECT 73.0 02/06/2019 0850      Wt Readings from Last 3 Encounters:  05/26/19 204 lb 9.6 oz (92.8 kg)  02/25/19 205 lb 4.8 oz (93.1 kg)  02/04/19 200 lb (90.7 kg)      ASSESSMENT AND PLAN:  1 Acute on chronic diastolic CHF Volume appears to be  up a little on exam.  I woud recomm lasix 20 and 10 KCL 1x per week to start   Weigh daily (her weight at time of cath was 195)   Watch/limit salt     2   CAD   Mild  Continue with risk factor modifications     (HTN, HL)  No symptoms of angina    2  HL Continue statin and follow lipids   Last LDL 73    4    HTN   BP is good  Continue meds  5   Morbid obesity  Watch intake    F/U in next winter    Signed, Dorris Carnes, MD  05/26/2019 1:41 PM    [address], [place], [place]  [address] Phone: [phone]; Fax: [phone]

## 2019-05-26 ENCOUNTER — Other Ambulatory Visit: Payer: Self-pay

## 2019-05-26 ENCOUNTER — Ambulatory Visit (INDEPENDENT_AMBULATORY_CARE_PROVIDER_SITE_OTHER): Payer: Medicare Other | Admitting: Internal Medicine

## 2019-05-26 ENCOUNTER — Encounter: Payer: Self-pay | Admitting: Internal Medicine

## 2019-05-26 VITALS — BP 118/56 | HR 70 | Ht 62.0 in | Wt 204.6 lb

## 2019-05-26 DIAGNOSIS — I5032 Chronic diastolic (congestive) heart failure: Secondary | ICD-10-CM

## 2019-05-26 MED ORDER — FUROSEMIDE 20 MG PO TABS: ORAL_TABLET | ORAL | 0 refills | Status: DC

## 2019-05-26 NOTE — Patient Instructions (Signed)
Medication Instructions:  Take LASIX 20 mg --one tablet by mouth ONCE A WEEK When you take the LASIX - take ONE EXTRA POTASSIUM also.  Lab Work: None  Testing/Procedures: none  Follow-Up: At [facility], you and your health needs are our priority.  As part of our continuing mission to provide you with exceptional heart care, we have created designated Provider Care Teams.  These Care Teams include your primary Cardiologist (physician) and Advanced Practice Providers (APPs -  Physician Assistants and Nurse Practitioners) who all work together to provide you with the care you need, when you need it.  Your next appointment:   12 month(s)  The format for your next appointment:   Either In Person or Virtual  Provider:   You may see Dorris Carnes, MD or one of the following Advanced Practice Providers on your designated Care Team:    Richardson Dopp, PA-C  [place], [place]  Daune Perch, NP    Other Instructions Please write in via MyChart with how your swelling is on once weekly Lasix (furosemide)

## 2019-05-28 ENCOUNTER — Other Ambulatory Visit: Payer: Self-pay | Admitting: Family Medicine

## 2019-05-30 ENCOUNTER — Other Ambulatory Visit: Payer: Self-pay | Admitting: Family Medicine

## 2019-05-30 DIAGNOSIS — M5136 Other intervertebral disc degeneration, lumbar region: Secondary | ICD-10-CM | POA: Diagnosis not present

## 2019-05-30 DIAGNOSIS — G894 Chronic pain syndrome: Secondary | ICD-10-CM | POA: Insufficient documentation

## 2019-06-20 ENCOUNTER — Other Ambulatory Visit: Payer: Self-pay | Admitting: Family Medicine

## 2019-06-20 DIAGNOSIS — I10 Essential (primary) hypertension: Secondary | ICD-10-CM

## 2019-06-21 ENCOUNTER — Other Ambulatory Visit: Payer: Self-pay | Admitting: Family Medicine

## 2019-06-24 DIAGNOSIS — H353222 Exudative age-related macular degeneration, left eye, with inactive choroidal neovascularization: Secondary | ICD-10-CM | POA: Diagnosis not present

## 2019-06-25 ENCOUNTER — Ambulatory Visit: Payer: Medicare Other | Admitting: Neurology

## 2019-06-30 ENCOUNTER — Encounter: Payer: Self-pay | Admitting: Family Medicine

## 2019-07-03 ENCOUNTER — Other Ambulatory Visit: Payer: Self-pay

## 2019-07-03 ENCOUNTER — Ambulatory Visit (INDEPENDENT_AMBULATORY_CARE_PROVIDER_SITE_OTHER): Payer: Medicare Other | Admitting: Family Medicine

## 2019-07-03 ENCOUNTER — Encounter: Payer: Self-pay | Admitting: Family Medicine

## 2019-07-03 VITALS — BP 150/80 | HR 64 | Temp 97.6°F | Resp 16 | Ht 62.0 in | Wt 204.6 lb

## 2019-07-03 DIAGNOSIS — I10 Essential (primary) hypertension: Secondary | ICD-10-CM | POA: Diagnosis not present

## 2019-07-03 DIAGNOSIS — E1149 Type 2 diabetes mellitus with other diabetic neurological complication: Secondary | ICD-10-CM | POA: Diagnosis not present

## 2019-07-03 DIAGNOSIS — R0981 Nasal congestion: Secondary | ICD-10-CM

## 2019-07-03 DIAGNOSIS — F411 Generalized anxiety disorder: Secondary | ICD-10-CM

## 2019-07-03 MED ORDER — LOSARTAN POTASSIUM-HCTZ 50-12.5 MG PO TABS: ORAL_TABLET | ORAL | 1 refills | Status: DC

## 2019-07-03 MED ORDER — AZELASTINE HCL 0.1 % NA SOLN: NASAL | 12 refills | Status: AC

## 2019-07-03 NOTE — Progress Notes (Signed)
Assessment/Plan:   1.  Parkinsons Disease  -Continue carbidopa/levodopa 25/100, 1 tablet 3 times per day.  Biggest issue is with compliance with tid dosing.  Stressed importance of regular dosing even if waking up late  -Does not tolerate exercise well because of her chronic pain 2.  Chronic low back pain  -Follows with Dr. Nelva Bush 3.  Diabetic peripheral neuropathy  -Still with right third toe ulcer.  Seeing orthopedics. 4.  Adjustment d/o  -due to stress of caregiving for husband with dementia  -respite resources given to patient to call  Subjective:   Katherine Best was seen today in follow up for Parkinsons disease.  My previous records were reviewed prior to todays visit as well as outside records available to me. Pt denies falls.  Pt denies lightheadedness, near syncope.  No hallucinations.  Mood has been fair.  Patient saw her primary care physician on April 8.  Patient is under caregiving stress.  She caregives for her husband who has dementia.  She is struggling more with this task and has trouble sleeping b/c of his needs.  She therefore wakes up late and then is off of schedule.  Current prescribed movement disorder medications: Carbidopa/levodopa 25/100, 1 tablet 3 times per day (missing the middle of the day dosage "quite often")   ALLERGIES:   Allergies  Allergen Reactions  . Gabapentin Itching  . Sulfonamide Derivatives Nausea Only    REACTION: Nausea    CURRENT MEDICATIONS:  Outpatient Encounter Medications as of 07/07/2019  Medication Sig  . ALPRAZolam (XANAX) 0.25 MG tablet Take 1 tablet (0.25 mg total) by mouth at bedtime as needed for anxiety.  Marland Kitchen aspirin EC 81 MG tablet Take 81 mg by mouth daily.  Marland Kitchen atorvastatin (LIPITOR) 20 MG tablet TAKE ONE TABLET BY MOUTH DAILY  . azelastine (ASTELIN) 0.1 % nasal spray 2 sprays each nostril 20-30 min prior to wearing CPAP  . calcium carbonate (CALCIUM 600) 600 MG TABS tablet Take 600 mg by mouth daily.   .  carbidopa-levodopa (SINEMET IR) 25-100 MG tablet TAKE ONE TABLET BY MOUTH THREE TIMES DAILY  . celecoxib (CELEBREX) 200 MG capsule TAKE ONE CAPSULE BY MOUTH DAILY  . diclofenac sodium (VOLTAREN) 1 % GEL Apply 2 g topically 4 (four) times daily.   Marland Kitchen dicyclomine (BENTYL) 10 MG capsule Take 1 tab 2-3 times daily for diarrhea/urgency.  . DULoxetine (CYMBALTA) 60 MG capsule TAKE ONE CAPSULE BY MOUTH EVERY DAY  . estradiol (ESTRACE) 0.5 MG tablet Take 1 tablet (0.5 mg total) by mouth at bedtime.  . ferrous sulfate 325 (65 FE) MG tablet Take 1 tablet (325 mg total) by mouth 2 (two) times daily with a meal.  . furosemide (LASIX) 20 MG tablet As directed, one tablet once a week  . glucose blood test strip Use to check blood once daily  . glucose blood test strip OneTouch Verio test strips  USE TO CHECK BLOOD ONCE DAILY  . HYDROcodone-acetaminophen (NORCO/VICODIN) 5-325 MG tablet Take 1 tablet by mouth 3 (three) times daily as needed.  . Lancets 30G MISC Use to check blood sugar once daily  . levothyroxine (SYNTHROID) 25 MCG tablet TAKE ONE TABLET BY MOUTH EVERY DAY  . loratadine (CLARITIN) 10 MG tablet Take 10 mg by mouth daily as needed for allergies.  Marland Kitchen losartan-hydrochlorothiazide (HYZAAR) 50-12.5 MG tablet 1 and 1/2 tabs po qd  . metoprolol tartrate (LOPRESSOR) 50 MG tablet TAKE ONE TABLET BY MOUTH TWICE DAILY  . mirtazapine (REMERON) 15  MG tablet TAKE 1/2 TABLET BY MOUTH AT BEDTIME  . Multiple Vitamins-Minerals (MULTIVITAMIN ADULT PO) Take 1 tablet by mouth daily.   . Multiple Vitamins-Minerals (PRESERVISION AREDS 2 PO) Take 1 tablet by mouth daily.   Marland Kitchen MYRBETRIQ 50 MG TB24 tablet Take 50 mg by mouth daily.  Marland Kitchen omeprazole (PRILOSEC) 20 MG capsule Take 1 capsule (20 mg total) by mouth daily.  . ondansetron (ZOFRAN) 4 MG tablet Take 1 tablet by mouth as needed.  Marland Kitchen oxybutynin (DITROPAN-XL) 10 MG 24 hr tablet Take 1 tablet (10 mg total) by mouth daily as needed (urine frequency).  Marland Kitchen  oxyCODONE-acetaminophen (PERCOCET/ROXICET) 5-325 MG tablet Take 1 tablet by mouth 4 (four) times daily as needed.  . pioglitazone (ACTOS) 30 MG tablet Take 1 tablet (30 mg total) by mouth daily.  . potassium chloride SA (KLOR-CON) 20 MEQ tablet TAKE ONE TABLET BY MOUTH EVERY DAY  . pregabalin (LYRICA) 50 MG capsule TAKE ONE CAPSULE BY MOUTH TWICE DAILY  . tiZANidine (ZANAFLEX) 4 MG tablet Take 4 mg by mouth every 8 (eight) hours as needed for muscle spasms.    No facility-administered encounter medications on file as of 07/07/2019.    Objective:   PHYSICAL EXAMINATION:    VITALS:   Vitals:   07/07/19 1452  BP: 140/90  Pulse: 96  Resp: 18  SpO2: 96%  Weight: 204 lb (92.5 kg)  Height: 5\' 2"  (1.575 m)    GEN:  The patient appears stated age and is in NAD. HEENT:  Normocephalic, atraumatic.  The mucous membranes are moist. The superficial temporal arteries are without ropiness or tenderness. CV:  RRR Lungs:  CTAB Neck/HEME:  There are no carotid bruits bilaterally.  Neurological examination:  Orientation: The patient is alert and oriented x3. Cranial nerves: There is good facial symmetry with facial hypomimia. The speech is fluent and clear. Soft palate rises symmetrically and there is no tongue deviation. Hearing is intact to conversational tone. Sensation: Sensation is intact to light touch throughout Motor: Strength is at least antigravity x4.  Movement examination: Tone: There is mild increased tone in the LUE Abnormal movements: rare and mild LUE rest tremor Coordination:  There is mild decremation with RAM's, mostly with L foot taps Gait and Station: The patient pushes off of a chair to arise.  She ambulates with her cane.  She is slightly antalgic.    Total time spent on today's visit was 30 minutes, including both face-to-face time and nonface-to-face time.  Time included that spent on review of records (prior notes available to me/labs/imaging if pertinent), discussing  treatment and goals, answering patient's questions and coordinating care.  Cc:  Tammi Sou, MD

## 2019-07-03 NOTE — Progress Notes (Signed)
OFFICE VISIT  07/03/2019   CC:  Chief Complaint  Patient presents with  . Follow-up    RCI, pt is not fasting   HPI:    Patient is a 86 y.o. Caucasian female who presents for 5 mo f/u DM with DPN, HTN, chronic anxiety.  A/P as of last visit: "1) DM 2 w/complication: has DPN and current R 3rd toe ulcer for which she is currently seeing ortho. She does need custom diabetic orthotics/extra depth diabetic shoes. Recent HbA1c 6.5%. Continue pioglit 30mg  qd and diabetic diet.  2) HTN: The current medical regimen is effective;  continue present plan and medications. Lytes/cr good 1 wk ago.  3) GAD/insomnia: doing pretty good considering the stressful issues she is dealing with. No med changes.  Encouraged her to go ahead and take a daytime alpraz dose prn.  4) Chronic LBP->per Dr. Nelva Bush.  5) Parkinson's dz: sounds like she is not sure if sinemet is helping so far. She has f/u with Dr. Carles Collet again pretty soon.  6) Hypothyroidism: compliant with med and taking it correctly. TSH normal 1 wk ago."  Interim hx: Tired, o/w no signif acute complaint.  DM: fasting 130s-160 fastings.  Taking pioglitazone, says diet is "okay". Feet: R foot 2nd toe tingles, hx of ulcer on this toe.  Some numbness on bottoms bilat.  HTN: bp consistently 150s/80s.  She doesn't get much sleep b/c husband wakes her up frequently. She does wear her CPAP but it does cause some coughing. Nose congested a lot at night, does not take any nasal spray.    Anx/dep: duloxetine, mirtazapine, and prn alprazolam have been helpful longterm. She is doing ok on these. Her biggest issue is the stress of taking care of her husband who has signif dementia.  PMP AWARE reviewed today: most recent rx for alpraz  was filled 02/13/19, # [phone], rx by me. No red flags.  Past Medical History:  Diagnosis Date  . Abdominal pain, right lower quadrant   . Cataracts, both eyes    soon to have cataract surg as of 03/2015  . Chronic  diastolic heart failure ([place]) 2019  . Chronic pain syndrome    low back->opioids managed by Dr. Nelva Bush.  . Diabetes mellitus with complication (HCC)    DPN.  NO diab retinopathy as of 06/2017 eye exam.  . Diabetic foot ulcer ([place]) 11/2018   R 3rd toe  . Diabetic peripheral neuropathy associated with type 2 diabetes mellitus ([place])    uncomplicated diabetic ulcer R 3rd toe-->MRI both feet Neg for osteo 10/2018  . Diverticulosis of colon    noted on colonoscopy 2008  . Dyspnea    a. 09/2011 CTA Chest: No PE, Ca2+ cors;  b. 09/2011 R&L heart Cath: Relatively nl R heart pressures, nl co/ci, nonobs cad, nl EF.  12/2015 stress test normal, echo with grd II DD--likely explains DOE.  Worsening chronic DOE 2019-->cath w/no agiograph signif CAD.  Pulm HTN/DD.  EF 65%.  Cards started lasix.  Marland Kitchen External hemorrhoid   . Fatigue    w/ ? excessive daytime somnolence; ? OSA--eval by Dr. Halford Chessman 06/2016, home sleep study ordered.  . Former smoker    30 pack-yrs, quit 2005  . Heart murmur    hx of  . Hx of adenomatous polyp of colon 08/25/2016   07/2016 no recall  . Hyperlipemia, mixed    trigs remain elevated (200s-300s range), but LDL at goal.  . Hypertension   . Hypertensive heart disease 2019  .  Hypothyroidism   . IBS (irritable bowel syndrome)    IBS-D (Dr. Carlean Purl) + ? worsened by GI side effect of metformin.  NO pancreatic insufficiency.  As per 09/2017 GI f/u, plan is to continue dicyclomine + f/u with them on prn basis  . Interstitial cystitis   . Liver function study, abnormal   . Lumbar degenerative disc disease    Lumb spondylosis & facet-mediated pain per Dr. Nelva Bush.  Has gotten repeated LB injections.  Eval by Dr. Tonita Cong 03/2017.  Spinal stenosis without neurogenic claudication.  RF neurotomy 05/2017 and 01/2018->on L and R L3 and L4 medial branch and L5 dorsal ramus nerves.  . Lumbar degenerative disc disease    Surgery not recommended, Dr. Rolena Infante and Dr. Ronnald Ramp.  Radiofreqency neuronotomy 04/18/19, Dr.  Nelva Bush.  . Lumbar scoliosis    lumbosacral spondylosis w/out myelopathy (radiofrequency ablation/lesioning procedure planned for 05/2017).  . Macular degeneration    OS exudative (avastin treatments).  Advanced nonexudative OD, monitor.  AREDS  . Macular hole 07/05/2017   OD; surgically repaired 07/2017.  . Menopausal syndrome    restarted estradiol 2016  . Morton's neuroma   . Obesity, Class II, BMI 35-39.9   . OSA on CPAP 07/2016   HST 07/27/16 >> AHI 6.2, SaO2 low 79%. Did not tolerate CPAP.  Pt to try again as of 12/2017.  Compliant with CPAP as of 02/2018 pulm f/u.  . Osteoarthritis, multiple sites 2019   HIPs->surgery NOT recommended ([facility]).  +bilat feet.  . Other specified gastritis without mention of hemorrhage    hx of antral gastritis on EGD 05/2009  . Parkinson's disease ([place]) 2020   Carb/lev started by Dr. Carles Collet 08/2018  . Peripheral neuropathy    ? DPN?--per EMG 11/2012.  Intolerant of gabapentin, failed pamelor.  Radiofrequency neurotomy 12/11/14 helped LB pain (Dr. Nelva Bush)  . Pneumonia   . RLS (restless legs syndrome)   . Tarsal tunnel syndrome   . Trochanteric bursitis of both hips 2019   Injections: [facility]  . Unspecified venous (peripheral) insufficiency     Past Surgical History:  Procedure Laterality Date  . ABI/Arterial eval  02/25/2019   normal ankle arm index and normal triphasic waveforms bilat->no PAD->reassured by vasc surgeon  . ANKLE SURGERY     bilateral  . CARDIAC CATHETERIZATION  09/2011;11/2017   2013: Nonobstructive CAD. 2019: mild pulmonary hypertension. LV end diastolic pressure is moderately elevated.  EF >65%.  DD/hypertensive heart dz-->BP control and add ASA 81mg  + lasix qd.  . CARDIOVASCULAR STRESS TEST  01/04/2016   Normal stress nuclear study with no ischemia or infarction; EF 82 with normal wall motion  . CATARACT EXTRACTION    . COLONOSCOPY  2002/2005, 11/14/06, and 05/2009   polyps 2002 but none 2005 or 2008 or 2011.  Repeat 08/17/16  for diarrhea: one diminutive adenoma + small polyps removed, diverticulosis noted--no further colonoscopies needed (Dr. Carlean Purl).  . CT angiogram chest  11/05/2017   NEG for PE.  Marland Kitchen DEXA  10/2016   NORMAL--consider repeat 2 yrs.  . ESOPHAGOGASTRODUODENOSCOPY  05/2009   Moderate antral gastritis  . FOOT SURGERY     R foot bunionectomy and arthroplasty of digits R foot.  Arthroplasty digits 3 and 4 L foot.  Marland Kitchen HAMMER TOE SURGERY     bilateral  . left tarsal tunnel release     sept '11 (Dr Beola Cord)  . PARTIAL KNEE ARTHROPLASTY Right 11/06/2016   Procedure: UNICOMPARTMENTAL RIGHT KNEE- Medially;  Surgeon: Paralee Cancel, MD;  Location:  [facility];  Service: Orthopedics;  Laterality: Right;  90 mins  . PFTs  12/05/2017   no obstruction, mild restriction. No reversibility. Moderate DLCO defect --continue prn albut  . radiofrequency neurotomy  06/18/2015   bilat L3-4 medial branch nerve and bilat L5 dorsal ramus nerve.(Dr. Nelva Bush)  . RIGHT/LEFT HEART CATH AND CORONARY ANGIOGRAPHY N/A 12/07/2017   Procedure: RIGHT/LEFT HEART CATH AND CORONARY ANGIOGRAPHY;  Surgeon: Leonie Man, MD;  Location: [facility];  Service: Cardiovascular;  Laterality: N/A;  . TONSILLECTOMY    . TOTAL ABDOMINAL HYSTERECTOMY W/ BILATERAL SALPINGOOPHORECTOMY    . TRANSTHORACIC ECHOCARDIOGRAM  08/2011; 2017   2013 Grade I DD; 2017 EF 60-65%, normal wall motion, grd II DD.    Outpatient Medications Prior to Visit  Medication Sig Dispense Refill  . ALPRAZolam (XANAX) 0.25 MG tablet Take 1 tablet (0.25 mg total) by mouth at bedtime as needed for anxiety. 30 tablet 1  . aspirin EC 81 MG tablet Take 81 mg by mouth daily.    Marland Kitchen atorvastatin (LIPITOR) 20 MG tablet TAKE ONE TABLET BY MOUTH DAILY 30 tablet 0  . calcium carbonate (CALCIUM 600) 600 MG TABS tablet Take 600 mg by mouth daily.     . celecoxib (CELEBREX) 200 MG capsule TAKE ONE CAPSULE BY MOUTH DAILY 90 capsule 1  . diclofenac sodium (VOLTAREN) 1 % GEL Apply 2 g  topically 4 (four) times daily.   1  . DULoxetine (CYMBALTA) 60 MG capsule TAKE ONE CAPSULE BY MOUTH EVERY DAY 90 capsule 0  . estradiol (ESTRACE) 0.5 MG tablet Take 1 tablet (0.5 mg total) by mouth at bedtime. 90 tablet 3  . ferrous sulfate 325 (65 FE) MG tablet Take 1 tablet (325 mg total) by mouth 2 (two) times daily with a meal. 30 tablet 0  . furosemide (LASIX) 20 MG tablet As directed, one tablet once a week 45 tablet 0  . glucose blood test strip Use to check blood once daily 100 each 11  . glucose blood test strip OneTouch Verio test strips  USE TO CHECK BLOOD ONCE DAILY    . HYDROcodone-acetaminophen (NORCO/VICODIN) 5-325 MG tablet Take 1 tablet by mouth 3 (three) times daily as needed.  0  . Lancets 30G MISC Use to check blood sugar once daily 100 each 11  . levothyroxine (SYNTHROID) 25 MCG tablet TAKE ONE TABLET BY MOUTH EVERY DAY 30 tablet 0  . loratadine (CLARITIN) 10 MG tablet Take 10 mg by mouth daily as needed for allergies.    . metoprolol tartrate (LOPRESSOR) 50 MG tablet TAKE ONE TABLET BY MOUTH TWICE DAILY 180 tablet 1  . mirtazapine (REMERON) 15 MG tablet TAKE 1/2 TABLET BY MOUTH AT BEDTIME 45 tablet 1  . Multiple Vitamins-Minerals (MULTIVITAMIN ADULT PO) Take 1 tablet by mouth daily.     . Multiple Vitamins-Minerals (PRESERVISION AREDS 2 PO) Take 1 tablet by mouth daily.     Marland Kitchen MYRBETRIQ 50 MG TB24 tablet Take 50 mg by mouth daily.    Marland Kitchen omeprazole (PRILOSEC) 20 MG capsule Take 1 capsule (20 mg total) by mouth daily. 90 capsule 1  . oxyCODONE-acetaminophen (PERCOCET/ROXICET) 5-325 MG tablet Take 1 tablet by mouth 4 (four) times daily as needed.    . pioglitazone (ACTOS) 30 MG tablet Take 1 tablet (30 mg total) by mouth daily. 90 tablet 0  . potassium chloride SA (KLOR-CON) 20 MEQ tablet TAKE ONE TABLET BY MOUTH EVERY DAY 90 tablet 1  . pregabalin (LYRICA) 50 MG capsule  TAKE ONE CAPSULE BY MOUTH TWICE DAILY 180 capsule 1  . tiZANidine (ZANAFLEX) 4 MG tablet Take 4 mg by mouth  every 8 (eight) hours as needed for muscle spasms.   1  . losartan-hydrochlorothiazide (HYZAAR) 50-12.5 MG tablet Take 1 tablet by mouth daily. 30 tablet 0  . carbidopa-levodopa (SINEMET IR) 25-100 MG tablet TAKE ONE TABLET BY MOUTH THREE TIMES DAILY (Patient not taking: Reported on 07/03/2019) 270 tablet 1  . dicyclomine (BENTYL) 10 MG capsule Take 1 tab 2-3 times daily for diarrhea/urgency. (Patient not taking: Reported on 07/03/2019) 90 capsule 6  . ondansetron (ZOFRAN) 4 MG tablet Take 1 tablet by mouth as needed.    Marland Kitchen oxybutynin (DITROPAN-XL) 10 MG 24 hr tablet Take 1 tablet (10 mg total) by mouth daily as needed (urine frequency). (Patient not taking: Reported on 07/03/2019) 30 tablet 6  . doxycycline (VIBRA-TABS) 100 MG tablet Take 100 mg by mouth 2 (two) times daily.     No facility-administered medications prior to visit.    Allergies  Allergen Reactions  . Gabapentin Itching  . Sulfonamide Derivatives Nausea Only    REACTION: Nausea    ROS As per HPI  PE: Blood pressure (!) 150/80, pulse 64, temperature 97.6 F (36.4 C), temperature source Temporal, resp. rate 16, height 5\' 2"  (1.575 m), weight 204 lb 9.6 oz (92.8 kg), SpO2 100 %. Gen: Alert, well appearing.  Patient is oriented to person, place, time, and situation. AFFECT: pleasant, lucid thought and speech. CV: RRR, no m/r/g.   LUNGS: CTA bilat, nonlabored resps, good aeration in all lung fields. EXT: no clubbing or cyanosis.  No pitting edema.  Foot exam - no swelling or tenderness. Color and temperature is normal. Sensation is impaired to light touch and monofilament bilat, monofilament ABSENT on L plantar surface. Peripheral pulses are palpable. Toenails are normal. R 2nd toe medial aspect with punctate shallow ulcer w/out drainage, erythema, or tenderness.   LABS:  Lab Results  Component Value Date   TSH 3.45 02/06/2019   Lab Results  Component Value Date   WBC 8.9 05/09/2018   HGB 11.1 05/09/2018   HCT 32.5 (L)  05/09/2018   MCV 92 05/09/2018   PLT 327 05/09/2018   Lab Results  Component Value Date   IRON 74 11/01/2017   IRON 92 11/01/2017   TIBC 373 11/01/2017   FERRITIN 42.4 11/01/2017    Lab Results  Component Value Date   CREATININE 0.75 02/06/2019   BUN 20 02/06/2019   NA 140 02/06/2019   K 3.8 02/06/2019   CL 100 02/06/2019   CO2 29 02/06/2019   Lab Results  Component Value Date   ALT 21 02/06/2019   AST 17 02/06/2019   ALKPHOS 98 02/06/2019   BILITOT 0.4 02/06/2019   Lab Results  Component Value Date   CHOL 161 02/06/2019   Lab Results  Component Value Date   HDL 44.70 02/06/2019   Lab Results  Component Value Date   LDLCALC 52 05/09/2018   Lab Results  Component Value Date   TRIG 307.0 (H) 02/06/2019   Lab Results  Component Value Date   CHOLHDL 4 02/06/2019   Lab Results  Component Value Date   HGBA1C 6.5 02/06/2019    IMPRESSION AND PLAN:  1) DM 2 with DPN. Glucoses fairly well controlled on actos 30mg  qd. HbA1c, BMET, feet exam today-->no active feet problems but definite exam findings c/w DPN.  2) Uncontrolled HTN: plan is to Increase losar hct 50/12.5  to 1 and 1/2 tabs qd. Lytes/cr today.  3) Chronic anxiety, recurrent MDD: stable on remeron, cymbalta, and prn benzo.  4) Fatigue: poor sleep due to taking care of demented husband. Has OSA, and says nasal congestion causing some issues with her ability to use her CPAP. Rx'd Astelin for nasal cong->take prior to wearing CPAP (also saline nasal spray), also saline nasal spray.   An After Visit Summary was printed and given to the patient.  FOLLOW UP: Return in about 4 weeks (around 07/31/2019) for f/u HTN.  Signed:  Crissie Sickles, MD           07/03/2019

## 2019-07-03 NOTE — Patient Instructions (Signed)
Get otc saline nasal spray and use 2 sprays in each nostril before bedtime, blow and clear nasal passages.  Then apply the astelin nasal spray (rx sent in today) as directed.  Take 1 and 1/2 of your losartan-HCT tabs every day.

## 2019-07-04 LAB — BASIC METABOLIC PANEL
BUN: 23 mg/dL (ref 6–23)
CO2: 32 mEq/L (ref 19–32)
Calcium: 9.5 mg/dL (ref 8.4–10.5)
Chloride: 100 mEq/L (ref 96–112)
Creatinine, Ser: 0.81 mg/dL (ref 0.40–1.20)
GFR: 67.47 mL/min (ref >60.00)
Glucose, Bld: 122 mg/dL — ABNORMAL HIGH (ref 70–99)
Potassium: 4 mEq/L (ref 3.5–5.1)
Sodium: 140 mEq/L (ref 135–145)

## 2019-07-04 LAB — HEMOGLOBIN A1C: Hgb A1c MFr Bld: 6.4 % (ref 4.6–6.5)

## 2019-07-07 ENCOUNTER — Other Ambulatory Visit: Payer: Self-pay

## 2019-07-07 ENCOUNTER — Encounter: Payer: Self-pay | Admitting: Neurology

## 2019-07-07 ENCOUNTER — Ambulatory Visit (INDEPENDENT_AMBULATORY_CARE_PROVIDER_SITE_OTHER): Payer: Medicare Other | Admitting: Neurology

## 2019-07-07 VITALS — BP 140/90 | HR 96 | Resp 18 | Ht 62.0 in | Wt 204.0 lb

## 2019-07-07 DIAGNOSIS — G8929 Other chronic pain: Secondary | ICD-10-CM

## 2019-07-07 DIAGNOSIS — M545 Low back pain, unspecified: Secondary | ICD-10-CM

## 2019-07-07 DIAGNOSIS — G20 Parkinson's disease: Secondary | ICD-10-CM | POA: Diagnosis not present

## 2019-07-07 NOTE — Patient Instructions (Addendum)
1.  Call [facility] [place].  They have respite care services available.  Information attached 2.  You really need to take the carbidopa/levodopa 25/100 three times per day at 10am/2pm/6pm

## 2019-07-18 ENCOUNTER — Other Ambulatory Visit: Payer: Self-pay | Admitting: Family Medicine

## 2019-07-18 NOTE — Telephone Encounter (Signed)
Pharmacy called in wanting to know if a new Rx could be sent over for 90 days, all her medications are 90 days supply except this one and wanting to get this one switched also     losartan-hydrochlorothiazide (HYZAAR) 50-12.5 MG    Please call pharmacy and advise

## 2019-07-23 ENCOUNTER — Telehealth: Payer: Self-pay

## 2019-07-23 NOTE — Telephone Encounter (Signed)
Received fax for diabetic shoes rx, Placed on PCP desk to review and sign, if appropriate.

## 2019-07-24 DIAGNOSIS — M5136 Other intervertebral disc degeneration, lumbar region: Secondary | ICD-10-CM | POA: Diagnosis not present

## 2019-07-25 ENCOUNTER — Other Ambulatory Visit: Payer: Self-pay | Admitting: Family Medicine

## 2019-07-31 ENCOUNTER — Telehealth: Payer: Self-pay

## 2019-07-31 NOTE — Telephone Encounter (Signed)
[employer] faxed request for non formulary Exception Form for her Losartan-Potassium-HCTZ. Completed form and faxed back. Placed on my desk until response returns.

## 2019-08-01 NOTE — Telephone Encounter (Signed)
Medication has been approved

## 2019-08-04 ENCOUNTER — Ambulatory Visit: Payer: Medicare Other | Admitting: Family Medicine

## 2019-08-07 ENCOUNTER — Other Ambulatory Visit: Payer: Self-pay

## 2019-08-07 ENCOUNTER — Encounter: Payer: Self-pay | Admitting: Family Medicine

## 2019-08-07 ENCOUNTER — Ambulatory Visit (INDEPENDENT_AMBULATORY_CARE_PROVIDER_SITE_OTHER): Payer: Medicare Other | Admitting: Family Medicine

## 2019-08-07 VITALS — BP 111/73 | HR 72 | Temp 97.8°F | Resp 16 | Ht 62.0 in | Wt 204.4 lb

## 2019-08-07 DIAGNOSIS — N3281 Overactive bladder: Secondary | ICD-10-CM | POA: Diagnosis not present

## 2019-08-07 DIAGNOSIS — F4321 Adjustment disorder with depressed mood: Secondary | ICD-10-CM

## 2019-08-07 DIAGNOSIS — I10 Essential (primary) hypertension: Secondary | ICD-10-CM

## 2019-08-07 MED ORDER — MYRBETRIQ 50 MG PO TB24: 50.0000 mg | ORAL_TABLET | Freq: Every day | ORAL | 6 refills | Status: DC

## 2019-08-07 MED ORDER — OXYBUTYNIN CHLORIDE ER 10 MG PO TB24: 10.0000 mg | ORAL_TABLET | Freq: Every day | ORAL | 6 refills | Status: DC | PRN

## 2019-08-07 NOTE — Telephone Encounter (Signed)
RF request for Oxybutynin LOV:08/07/19 Next ov: n/a Last written:09/13/18(30,6)   Medication pending, please advise.

## 2019-08-07 NOTE — Progress Notes (Signed)
OFFICE VISIT  08/07/2019   CC:  Chief Complaint  Patient presents with  . Follow-up    Hypertension   HPI:    Patient is a 86 y.o. Caucasian female who presents for 5 wk f/u uncontrolled HTN. A/P as of last visit: "1) DM 2 with DPN. Glucoses fairly well controlled on actos 30mg  qd. HbA1c, BMET, feet exam today-->no active feet problems but definite exam findings c/w DPN.  2) Uncontrolled HTN: plan is to Increase losar hct 50/12.5 to 1 and 1/2 tabs qd. Lytes/cr today.  3) Chronic anxiety, recurrent MDD: stable on remeron, cymbalta, and prn benzo.  4) Fatigue: poor sleep due to taking care of demented husband. Has OSA, and says nasal congestion causing some issues with her ability to use her CPAP. Rx'd Astelin for nasal cong->take prior to wearing CPAP (also saline nasal spray), also saline nasal spray."  INTERIM HX: Unfortunately her husband passed away earlier this month. Has 3 sons around now for support at least temporarily. Intermittent sleep problems, has had to use xanax a couple times and this has helped.  Has been using oxybutynin more lately.  Still having moderate amount of urinary urgency/incont, particularly when she gets up at night to pee.  No dysuria, abd pain, nausea or fever. She is not taking myrbetriq, checked with pharmacy today and she last filled this for #30 tabs about 7 months ago.  She does not remember why she stopped taking it.  BP 140/76 recently at home.  She is pretty confident that she'll be fine living alone.  She had been managing things fine for the last couple years with her husband's advancing dementia.    Past Medical History:  Diagnosis Date  . Abdominal pain, right lower quadrant   . Cataracts, both eyes    soon to have cataract surg as of 03/2015  . Chronic diastolic heart failure ([place]) 2019  . Chronic pain syndrome    low back->opioids managed by Dr. Nelva Bush.  . Diabetes mellitus with complication (HCC)    DPN.  NO diab  retinopathy as of 06/2017 eye exam.  . Diabetic foot ulcer ([place]) 11/2018   R 3rd toe  . Diabetic peripheral neuropathy associated with type 2 diabetes mellitus ([place])    uncomplicated diabetic ulcer R 3rd toe-->MRI both feet Neg for osteo 10/2018  . Diverticulosis of colon    noted on colonoscopy 2008  . Dyspnea    a. 09/2011 CTA Chest: No PE, Ca2+ cors;  b. 09/2011 R&L heart Cath: Relatively nl R heart pressures, nl co/ci, nonobs cad, nl EF.  12/2015 stress test normal, echo with grd II DD--likely explains DOE.  Worsening chronic DOE 2019-->cath w/no agiograph signif CAD.  Pulm HTN/DD.  EF 65%.  Cards started lasix.  Marland Kitchen External hemorrhoid   . Fatigue    w/ ? excessive daytime somnolence; ? OSA--eval by Dr. Halford Chessman 06/2016, home sleep study ordered.  . Former smoker    30 pack-yrs, quit 2005  . Heart murmur    hx of  . Hx of adenomatous polyp of colon 08/25/2016   07/2016 no recall  . Hyperlipemia, mixed    trigs remain elevated (200s-300s range), but LDL at goal.  . Hypertension   . Hypertensive heart disease 2019  . Hypothyroidism   . IBS (irritable bowel syndrome)    IBS-D (Dr. Carlean Purl) + ? worsened by GI side effect of metformin.  NO pancreatic insufficiency.  As per 09/2017 GI f/u, plan is to continue dicyclomine +  f/u with them on prn basis  . Interstitial cystitis   . Liver function study, abnormal   . Lumbar degenerative disc disease    Lumb spondylosis & facet-mediated pain per Dr. Nelva Bush.  Has gotten repeated LB injections.  Eval by Dr. Tonita Cong 03/2017.  Spinal stenosis without neurogenic claudication.  RF neurotomy 05/2017 and 01/2018->on L and R L3 and L4 medial branch and L5 dorsal ramus nerves.  . Lumbar degenerative disc disease    Surgery not recommended, Dr. Rolena Infante and Dr. Ronnald Ramp.  Radiofreqency neuronotomy 04/18/19, Dr. Nelva Bush.  . Lumbar scoliosis    lumbosacral spondylosis w/out myelopathy (radiofrequency ablation/lesioning procedure planned for 05/2017).  . Macular degeneration    OS  exudative (avastin treatments).  Advanced nonexudative OD, monitor.  AREDS  . Macular hole 07/05/2017   OD; surgically repaired 07/2017.  . Menopausal syndrome    restarted estradiol 2016  . Morton's neuroma   . Obesity, Class II, BMI 35-39.9   . OSA on CPAP 07/2016   HST 07/27/16 >> AHI 6.2, SaO2 low 79%. Did not tolerate CPAP.  Pt to try again as of 12/2017.  Compliant with CPAP as of 02/2018 pulm f/u.  . Osteoarthritis, multiple sites 2019   HIPs->surgery NOT recommended ([facility]).  +bilat feet.  . Other specified gastritis without mention of hemorrhage    hx of antral gastritis on EGD 05/2009  . Parkinson's disease ([place]) 2020   Carb/lev started by Dr. Carles Collet 08/2018  . Peripheral neuropathy    ? DPN?--per EMG 11/2012.  Intolerant of gabapentin, failed pamelor.  Radiofrequency neurotomy 12/11/14 helped LB pain (Dr. Nelva Bush)  . Pneumonia   . RLS (restless legs syndrome)   . Tarsal tunnel syndrome   . Trochanteric bursitis of both hips 2019   Injections: [facility]  . Unspecified venous (peripheral) insufficiency     Past Surgical History:  Procedure Laterality Date  . ABI/Arterial eval  02/25/2019   normal ankle arm index and normal triphasic waveforms bilat->no PAD->reassured by vasc surgeon  . ANKLE SURGERY     bilateral  . CARDIAC CATHETERIZATION  09/2011;11/2017   2013: Nonobstructive CAD. 2019: mild pulmonary hypertension. LV end diastolic pressure is moderately elevated.  EF >65%.  DD/hypertensive heart dz-->BP control and add ASA 81mg  + lasix qd.  . CARDIOVASCULAR STRESS TEST  01/04/2016   Normal stress nuclear study with no ischemia or infarction; EF 82 with normal wall motion  . CATARACT EXTRACTION    . COLONOSCOPY  2002/2005, 11/14/06, and 05/2009   polyps 2002 but none 2005 or 2008 or 2011.  Repeat 08/17/16 for diarrhea: one diminutive adenoma + small polyps removed, diverticulosis noted--no further colonoscopies needed (Dr. Carlean Purl).  . CT angiogram chest  11/05/2017   NEG  for PE.  Marland Kitchen DEXA  10/2016   NORMAL--consider repeat 2 yrs.  . ESOPHAGOGASTRODUODENOSCOPY  05/2009   Moderate antral gastritis  . FOOT SURGERY     R foot bunionectomy and arthroplasty of digits R foot.  Arthroplasty digits 3 and 4 L foot.  Marland Kitchen HAMMER TOE SURGERY     bilateral  . left tarsal tunnel release     sept '11 (Dr Beola Cord)  . PARTIAL KNEE ARTHROPLASTY Right 11/06/2016   Procedure: UNICOMPARTMENTAL RIGHT KNEE- Medially;  Surgeon: Paralee Cancel, MD;  Location: [facility];  Service: Orthopedics;  Laterality: Right;  90 mins  . PFTs  12/05/2017   no obstruction, mild restriction. No reversibility. Moderate DLCO defect --continue prn albut  . radiofrequency neurotomy  06/18/2015   bilat  L3-4 medial branch nerve and bilat L5 dorsal ramus nerve.(Dr. Nelva Bush)  . RIGHT/LEFT HEART CATH AND CORONARY ANGIOGRAPHY N/A 12/07/2017   Procedure: RIGHT/LEFT HEART CATH AND CORONARY ANGIOGRAPHY;  Surgeon: Leonie Man, MD;  Location: [facility];  Service: Cardiovascular;  Laterality: N/A;  . TONSILLECTOMY    . TOTAL ABDOMINAL HYSTERECTOMY W/ BILATERAL SALPINGOOPHORECTOMY    . TRANSTHORACIC ECHOCARDIOGRAM  08/2011; 2017   2013 Grade I DD; 2017 EF 60-65%, normal wall motion, grd II DD.    Outpatient Medications Prior to Visit  Medication Sig Dispense Refill  . ALPRAZolam (XANAX) 0.25 MG tablet Take 1 tablet (0.25 mg total) by mouth at bedtime as needed for anxiety. 30 tablet 1  . aspirin EC 81 MG tablet Take 81 mg by mouth daily.    Marland Kitchen atorvastatin (LIPITOR) 20 MG tablet TAKE ONE TABLET BY MOUTH DAILY 30 tablet 0  . azelastine (ASTELIN) 0.1 % nasal spray 2 sprays each nostril 20-30 min prior to wearing CPAP 30 mL 12  . calcium carbonate (CALCIUM 600) 600 MG TABS tablet Take 600 mg by mouth daily.     . carbidopa-levodopa (SINEMET IR) 25-100 MG tablet TAKE ONE TABLET BY MOUTH THREE TIMES DAILY 270 tablet 1  . celecoxib (CELEBREX) 200 MG capsule TAKE ONE CAPSULE BY MOUTH DAILY 90 capsule 1  . diclofenac  sodium (VOLTAREN) 1 % GEL Apply 2 g topically 4 (four) times daily.   1  . dicyclomine (BENTYL) 10 MG capsule Take 1 tab 2-3 times daily for diarrhea/urgency. 90 capsule 6  . DULoxetine (CYMBALTA) 60 MG capsule TAKE ONE CAPSULE BY MOUTH EVERY DAY 90 capsule 0  . furosemide (LASIX) 20 MG tablet As directed, one tablet once a week 45 tablet 0  . glucose blood test strip Use to check blood once daily 100 each 11  . glucose blood test strip OneTouch Verio test strips  USE TO CHECK BLOOD ONCE DAILY    . HYDROcodone-acetaminophen (NORCO/VICODIN) 5-325 MG tablet Take 1 tablet by mouth 3 (three) times daily as needed.  0  . Lancets 30G MISC Use to check blood sugar once daily 100 each 11  . levothyroxine (SYNTHROID) 25 MCG tablet TAKE ONE TABLET BY MOUTH EVERY DAY 30 tablet 0  . loratadine (CLARITIN) 10 MG tablet Take 10 mg by mouth daily as needed for allergies.    Marland Kitchen losartan-hydrochlorothiazide (HYZAAR) 50-12.5 MG tablet 1 and 1/2 tabs po qd 45 tablet 1  . metoprolol tartrate (LOPRESSOR) 50 MG tablet TAKE ONE TABLET BY MOUTH TWICE DAILY 180 tablet 1  . mirtazapine (REMERON) 15 MG tablet TAKE 1/2 TABLET BY MOUTH AT BEDTIME 45 tablet 1  . Multiple Vitamins-Minerals (MULTIVITAMIN ADULT PO) Take 1 tablet by mouth daily.     . Multiple Vitamins-Minerals (PRESERVISION AREDS 2 PO) Take 1 tablet by mouth daily.     Marland Kitchen omeprazole (PRILOSEC) 20 MG capsule Take 1 capsule (20 mg total) by mouth daily. 90 capsule 1  . ondansetron (ZOFRAN) 4 MG tablet Take 1 tablet by mouth as needed.    Marland Kitchen oxyCODONE-acetaminophen (PERCOCET/ROXICET) 5-325 MG tablet Take 1 tablet by mouth 4 (four) times daily as needed.    . pioglitazone (ACTOS) 30 MG tablet Take 1 tablet (30 mg total) by mouth daily. 90 tablet 0  . potassium chloride SA (KLOR-CON) 20 MEQ tablet TAKE ONE TABLET BY MOUTH EVERY DAY 90 tablet 1  . pregabalin (LYRICA) 50 MG capsule TAKE ONE CAPSULE BY MOUTH TWICE DAILY 180 capsule 1  .  tiZANidine (ZANAFLEX) 4 MG tablet  Take 4 mg by mouth every 8 (eight) hours as needed for muscle spasms.   1  . MYRBETRIQ 50 MG TB24 tablet Take 50 mg by mouth daily.    Marland Kitchen oxybutynin (DITROPAN-XL) 10 MG 24 hr tablet Take 1 tablet (10 mg total) by mouth daily as needed (urine frequency). 30 tablet 6  . estradiol (ESTRACE) 0.5 MG tablet Take 1 tablet (0.5 mg total) by mouth at bedtime. (Patient not taking: Reported on 08/07/2019) 90 tablet 3  . ferrous sulfate 325 (65 FE) MG tablet Take 1 tablet (325 mg total) by mouth 2 (two) times daily with a meal. (Patient not taking: Reported on 08/07/2019) 30 tablet 0   No facility-administered medications prior to visit.    Allergies  Allergen Reactions  . Gabapentin Itching  . Sulfonamide Derivatives Nausea Only    REACTION: Nausea    ROS As per HPI  PE: Vitals with BMI 08/07/2019 07/07/2019 07/03/2019  Height 5\' 2"  5\' 2"  5\' 2"   Weight 204 lbs 6 oz 204 lbs 204 lbs 10 oz  BMI 37.38 [id number]  Systolic [id number]  Diastolic 73 90 80  Pulse 72 96 64    Gen: Alert, well appearing.  Patient is oriented to person, place, time, and situation. AFFECT: pleasant, lucid thought and speech. CV: RRR, no m/r/g.   LUNGS: CTA bilat, nonlabored resps, good aeration in all lung fields. EXT: no clubbing or cyanosis.  2+ pitting edema bilat LL's.    LABS:  Lab Results  Component Value Date   TSH 3.45 02/06/2019   Lab Results  Component Value Date   WBC 8.9 05/09/2018   HGB 11.1 05/09/2018   HCT 32.5 (L) 05/09/2018   MCV 92 05/09/2018   PLT 327 05/09/2018   Lab Results  Component Value Date   CREATININE 0.81 07/03/2019   BUN 23 07/03/2019   NA 140 07/03/2019   K 4.0 07/03/2019   CL 100 07/03/2019   CO2 32 07/03/2019   Lab Results  Component Value Date   ALT 21 02/06/2019   AST 17 02/06/2019   ALKPHOS 98 02/06/2019   BILITOT 0.4 02/06/2019   Lab Results  Component Value Date   CHOL 161 02/06/2019   Lab Results  Component Value Date   HDL 44.70 02/06/2019   Lab  Results  Component Value Date   LDLCALC 52 05/09/2018   Lab Results  Component Value Date   TRIG 307.0 (H) 02/06/2019   Lab Results  Component Value Date   CHOLHDL 4 02/06/2019   Lab Results  Component Value Date   HGBA1C 6.4 07/03/2019    IMPRESSION AND PLAN:  1) HTN; control is near normal, pretty good considering her emotional/social situation right now, grieving for husband. No changes today, no labs today.  2) OAB, not well controlled.  Somehow off her myrbetriq, unclear what happened with this med but I want her to restart it and we'll continue to have her use oxybutynin xl on a prn basis. I want to see how she's doing regarding her symptoms and tolerance of these meds in 1 mo.  3) Grief reaction: appropriate, family is supportive, she will do well living on her own unless her own medical problems (parkinsonism particularly) cause more signif debilitation. She is taking alpraz appropriately PRN severe anxiety and insomnia.  An After Visit Summary was printed and given to the patient.  FOLLOW UP: Return in about 4 weeks (around 09/04/2019) for f/u  oab/med.  Signed:  Crissie Sickles, MD           08/07/2019

## 2019-08-13 NOTE — Telephone Encounter (Signed)
Faxed back already.

## 2019-08-14 ENCOUNTER — Other Ambulatory Visit: Payer: Self-pay | Admitting: Family Medicine

## 2019-08-14 DIAGNOSIS — I10 Essential (primary) hypertension: Secondary | ICD-10-CM

## 2019-08-21 ENCOUNTER — Other Ambulatory Visit: Payer: Self-pay | Admitting: Family Medicine

## 2019-08-26 HISTORY — PX: TOE AMPUTATION: SHX809

## 2019-08-28 DIAGNOSIS — E11621 Type 2 diabetes mellitus with foot ulcer: Secondary | ICD-10-CM | POA: Diagnosis not present

## 2019-08-28 DIAGNOSIS — L600 Ingrowing nail: Secondary | ICD-10-CM | POA: Diagnosis not present

## 2019-08-29 ENCOUNTER — Telehealth: Payer: Self-pay

## 2019-08-29 NOTE — Telephone Encounter (Signed)
Phone call received from [facility] from [facility] regarding form that was faxed and mailed to Dr. [name] to complete and sign.  Incorrect address/fax # on top of document. Updated in their system with correct information. When [place] and I discussed the form, (form is dated 5/27)  we thought Dr. [name] had already completed form and form was in route to scan center. Rather than duplicate work, I wanted to wait to see if form showed up in patients chart. Michelle at [employer] was okay with the delayed process.    It is not in patient's chart as of 6/4  Prescription - Letter / Certificate of Medical Necessity form given to [facility] so that Dr. [name] can complete and sign, if applicable.

## 2019-08-29 NOTE — Telephone Encounter (Signed)
Placed on PCP desk to review and sign, if appropriate.  

## 2019-08-29 NOTE — Telephone Encounter (Signed)
Signed and put in box to go up front. Signed:  Crissie Sickles, MD           08/29/2019

## 2019-09-03 ENCOUNTER — Other Ambulatory Visit: Payer: Self-pay

## 2019-09-04 ENCOUNTER — Encounter: Payer: Self-pay | Admitting: Family Medicine

## 2019-09-04 ENCOUNTER — Other Ambulatory Visit: Payer: Self-pay

## 2019-09-04 ENCOUNTER — Ambulatory Visit (INDEPENDENT_AMBULATORY_CARE_PROVIDER_SITE_OTHER): Payer: Medicare Other | Admitting: Family Medicine

## 2019-09-04 VITALS — BP 127/69 | HR 75 | Temp 97.9°F | Resp 16 | Ht 62.0 in | Wt 201.2 lb

## 2019-09-04 DIAGNOSIS — F4321 Adjustment disorder with depressed mood: Secondary | ICD-10-CM

## 2019-09-04 DIAGNOSIS — I10 Essential (primary) hypertension: Secondary | ICD-10-CM | POA: Diagnosis not present

## 2019-09-04 DIAGNOSIS — N3281 Overactive bladder: Secondary | ICD-10-CM | POA: Diagnosis not present

## 2019-09-04 DIAGNOSIS — R300 Dysuria: Secondary | ICD-10-CM | POA: Diagnosis not present

## 2019-09-04 NOTE — Progress Notes (Signed)
OFFICE VISIT  09/25/2019   CC:  Chief Complaint  Patient presents with  . Follow-up    OAB/med   HPI:    Patient is a 86 y.o. Caucasian female who presents accompanied by son ED (from [place]) for 1 mo f/u HTN, uncontrolled OAB, and grief reaction from recent death of her husband. A/P as of last visit: "1) HTN; control is near normal, pretty good considering her emotional/social situation right now, grieving for husband. No changes today, no labs today.  2) OAB, not well controlled.  Somehow off her myrbetriq, unclear what happened with this med but I want her to restart it and we'll continue to have her use oxybutynin xl on a prn basis. I want to see how she's doing regarding her symptoms and tolerance of these meds in 1 mo.  3) Grief reaction: appropriate, family is supportive, she will do well living on her own unless her own medical problems (parkinsonism particularly) cause more signif debilitation. She is taking alpraz appropriately PRN severe anxiety and insomnia."  INTERIM HX: Working out better.  She is taking both myrbetriq and oxybutinyn every night.  Nocturia x 1-2 per night compared to 4-5 before. Less frequency in daytime, less urgency.  Empties bladder completely.  Recently had couple days where she had persistent dysuria w/out any foul odor or change in appearance of urine.  Took  cipro bid a couple days and it resolved.  Home bp's much better: consistently <130/80.  Still feels like she's grieving appropriately, keeping busy, family visiting and supportive.    Past Medical History:  Diagnosis Date  . Abdominal pain, right lower quadrant   . Cataracts, both eyes    soon to have cataract surg as of 03/2015  . Chronic diastolic heart failure ([place]) 2019  . Chronic pain syndrome    low back->opioids managed by Dr. Nelva Bush.  . Diabetes mellitus with complication (HCC)    DPN.  NO diab retinopathy as of 06/2017 eye exam.  . Diabetic foot ulcer ([place]) 11/2018   R  3rd toe  . Diabetic peripheral neuropathy associated with type 2 diabetes mellitus ([place])    uncomplicated diabetic ulcer R 3rd toe-->MRI both feet Neg for osteo 10/2018  . Diverticulosis of colon    noted on colonoscopy 2008  . Dyspnea    a. 09/2011 CTA Chest: No PE, Ca2+ cors;  b. 09/2011 R&L heart Cath: Relatively nl R heart pressures, nl co/ci, nonobs cad, nl EF.  12/2015 stress test normal, echo with grd II DD--likely explains DOE.  Worsening chronic DOE 2019-->cath w/no agiograph signif CAD.  Pulm HTN/DD.  EF 65%.  Cards started lasix.  Marland Kitchen External hemorrhoid   . Fatigue    w/ ? excessive daytime somnolence; ? OSA--eval by Dr. Halford Chessman 06/2016, home sleep study ordered.  . Former smoker    30 pack-yrs, quit 2005  . Heart murmur    hx of  . Hx of adenomatous polyp of colon 08/25/2016   07/2016 no recall  . Hyperlipemia, mixed    trigs remain elevated (200s-300s range), but LDL at goal.  . Hypertension   . Hypertensive heart disease 2019  . Hypothyroidism   . IBS (irritable bowel syndrome)    IBS-D (Dr. Carlean Purl) + ? worsened by GI side effect of metformin.  NO pancreatic insufficiency.  As per 09/2017 GI f/u, plan is to continue dicyclomine + f/u with them on prn basis  . Interstitial cystitis   . Liver function study, abnormal   .  Lumbar degenerative disc disease    Lumb spondylosis & facet-mediated pain per Dr. Nelva Bush.  Has gotten repeated LB injections.  Eval by Dr. Tonita Cong 03/2017.  Spinal stenosis without neurogenic claudication.  RF neurotomy 05/2017 and 01/2018->on L and R L3 and L4 medial branch and L5 dorsal ramus nerves.  . Lumbar degenerative disc disease    Surgery not recommended, Dr. Rolena Infante and Dr. Ronnald Ramp.  Radiofreqency neuronotomy 04/18/19, Dr. Nelva Bush.  . Lumbar scoliosis    lumbosacral spondylosis w/out myelopathy (radiofrequency ablation/lesioning procedure planned for 05/2017).  . Macular degeneration    OS exudative (avastin treatments).  Advanced nonexudative OD, monitor.  AREDS  .  Macular hole 07/05/2017   OD; surgically repaired 07/2017.  . Menopausal syndrome    restarted estradiol 2016  . Morton's neuroma   . Obesity, Class II, BMI 35-39.9   . OSA on CPAP 07/2016   HST 07/27/16 >> AHI 6.2, SaO2 low 79%. Did not tolerate CPAP.  Pt to try again as of 12/2017.  Compliant with CPAP as of 02/2018 pulm f/u.  . Osteoarthritis, multiple sites 2019   HIPs->surgery NOT recommended ([facility]).  +bilat feet.  . Other specified gastritis without mention of hemorrhage    hx of antral gastritis on EGD 05/2009  . Parkinson's disease ([place]) 2020   Carb/lev started by Dr. Carles Collet 08/2018  . Peripheral neuropathy    ? DPN?--per EMG 11/2012.  Intolerant of gabapentin, failed pamelor.  Radiofrequency neurotomy 12/11/14 helped LB pain (Dr. Nelva Bush)  . Pneumonia   . RLS (restless legs syndrome)   . Tarsal tunnel syndrome   . Trochanteric bursitis of both hips 2019   Injections: [facility]  . Unspecified venous (peripheral) insufficiency     Past Surgical History:  Procedure Laterality Date  . ABI/Arterial eval  02/25/2019   normal ankle arm index and normal triphasic waveforms bilat->no PAD->reassured by vasc surgeon  . ANKLE SURGERY     bilateral  . CARDIAC CATHETERIZATION  09/2011;11/2017   2013: Nonobstructive CAD. 2019: mild pulmonary hypertension. LV end diastolic pressure is moderately elevated.  EF >65%.  DD/hypertensive heart dz-->BP control and add ASA 81mg  + lasix qd.  . CARDIOVASCULAR STRESS TEST  01/04/2016   Normal stress nuclear study with no ischemia or infarction; EF 82 with normal wall motion  . CATARACT EXTRACTION    . COLONOSCOPY  2002/2005, 11/14/06, and 05/2009   polyps 2002 but none 2005 or 2008 or 2011.  Repeat 08/17/16 for diarrhea: one diminutive adenoma + small polyps removed, diverticulosis noted--no further colonoscopies needed (Dr. Carlean Purl).  . CT angiogram chest  11/05/2017   NEG for PE.  Marland Kitchen DEXA  10/2016   NORMAL--consider repeat 2 yrs.  .  ESOPHAGOGASTRODUODENOSCOPY  05/2009   Moderate antral gastritis  . FOOT SURGERY     R foot bunionectomy and arthroplasty of digits R foot.  Arthroplasty digits 3 and 4 L foot.  Marland Kitchen HAMMER TOE SURGERY     bilateral  . left tarsal tunnel release     sept '11 (Dr Beola Cord)  . PARTIAL KNEE ARTHROPLASTY Right 11/06/2016   Procedure: UNICOMPARTMENTAL RIGHT KNEE- Medially;  Surgeon: Paralee Cancel, MD;  Location: [facility];  Service: Orthopedics;  Laterality: Right;  90 mins  . PFTs  12/05/2017   no obstruction, mild restriction. No reversibility. Moderate DLCO defect --continue prn albut  . radiofrequency neurotomy  06/18/2015   bilat L3-4 medial branch nerve and bilat L5 dorsal ramus nerve.(Dr. Nelva Bush)  . RIGHT/LEFT HEART CATH AND CORONARY ANGIOGRAPHY N/A  12/07/2017   Procedure: RIGHT/LEFT HEART CATH AND CORONARY ANGIOGRAPHY;  Surgeon: Leonie Man, MD;  Location: [facility];  Service: Cardiovascular;  Laterality: N/A;  . TONSILLECTOMY    . TOTAL ABDOMINAL HYSTERECTOMY W/ BILATERAL SALPINGOOPHORECTOMY    . TRANSTHORACIC ECHOCARDIOGRAM  08/2011; 2017   2013 Grade I DD; 2017 EF 60-65%, normal wall motion, grd II DD.    Outpatient Medications Prior to Visit  Medication Sig Dispense Refill  . ALPRAZolam (XANAX) 0.25 MG tablet Take 1 tablet (0.25 mg total) by mouth at bedtime as needed for anxiety. 30 tablet 1  . aspirin EC 81 MG tablet Take 81 mg by mouth daily.    Marland Kitchen atorvastatin (LIPITOR) 20 MG tablet TAKE ONE TABLET BY MOUTH DAILY 30 tablet 0  . azelastine (ASTELIN) 0.1 % nasal spray 2 sprays each nostril 20-30 min prior to wearing CPAP 30 mL 12  . calcium carbonate (CALCIUM 600) 600 MG TABS tablet Take 600 mg by mouth daily.     . carbidopa-levodopa (SINEMET IR) 25-100 MG tablet TAKE ONE TABLET BY MOUTH THREE TIMES DAILY 270 tablet 1  . celecoxib (CELEBREX) 200 MG capsule TAKE ONE CAPSULE BY MOUTH DAILY 90 capsule 0  . cephALEXin (KEFLEX) 500 MG capsule Take 500 mg by mouth 4 (four) times  daily.    . diclofenac sodium (VOLTAREN) 1 % GEL Apply 2 g topically 4 (four) times daily.   1  . dicyclomine (BENTYL) 10 MG capsule Take 1 tab 2-3 times daily for diarrhea/urgency. 90 capsule 6  . DULoxetine (CYMBALTA) 60 MG capsule TAKE ONE CAPSULE BY MOUTH EVERY DAY 90 capsule 0  . ferrous sulfate 325 (65 FE) MG tablet Take 1 tablet (325 mg total) by mouth 2 (two) times daily with a meal. 30 tablet 0  . furosemide (LASIX) 20 MG tablet As directed, one tablet once a week 45 tablet 0  . glucose blood test strip Use to check blood once daily 100 each 11  . glucose blood test strip OneTouch Verio test strips  USE TO CHECK BLOOD ONCE DAILY    . HYDROcodone-acetaminophen (NORCO/VICODIN) 5-325 MG tablet Take 1 tablet by mouth 3 (three) times daily as needed.  0  . Lancets 30G MISC Use to check blood sugar once daily 100 each 11  . levothyroxine (SYNTHROID) 25 MCG tablet TAKE ONE TABLET BY MOUTH EVERY DAY 30 tablet 0  . loratadine (CLARITIN) 10 MG tablet Take 10 mg by mouth daily as needed for allergies.    Marland Kitchen losartan-hydrochlorothiazide (HYZAAR) 50-12.5 MG tablet TAKE ONE & ONE-HALF TABLETS BY MOUTH EVERY DAY 45 tablet 1  . metoprolol tartrate (LOPRESSOR) 50 MG tablet TAKE ONE TABLET BY MOUTH TWICE DAILY 180 tablet 1  . mirtazapine (REMERON) 15 MG tablet TAKE 1/2 TABLET BY MOUTH AT BEDTIME 45 tablet 1  . Multiple Vitamins-Minerals (MULTIVITAMIN ADULT PO) Take 1 tablet by mouth daily.     . Multiple Vitamins-Minerals (PRESERVISION AREDS 2 PO) Take 1 tablet by mouth daily.     Marland Kitchen MYRBETRIQ 50 MG TB24 tablet Take 1 tablet (50 mg total) by mouth daily. 30 tablet 6  . omeprazole (PRILOSEC) 20 MG capsule Take 1 capsule (20 mg total) by mouth daily. 90 capsule 1  . ondansetron (ZOFRAN) 4 MG tablet Take 1 tablet by mouth as needed.    Marland Kitchen oxybutynin (DITROPAN-XL) 10 MG 24 hr tablet Take 1 tablet (10 mg total) by mouth daily as needed (urine frequency). 30 tablet 6  . oxyCODONE-acetaminophen (PERCOCET/ROXICET)  5-325 MG tablet Take 1 tablet by mouth 4 (four) times daily as needed.    . pioglitazone (ACTOS) 30 MG tablet TAKE ONE TABLET BY MOUTH DAILY 90 tablet 0  . potassium chloride SA (KLOR-CON) 20 MEQ tablet TAKE ONE TABLET BY MOUTH EVERY DAY 90 tablet 1  . pregabalin (LYRICA) 50 MG capsule TAKE ONE CAPSULE BY MOUTH TWICE DAILY 180 capsule 0  . tiZANidine (ZANAFLEX) 4 MG tablet Take 4 mg by mouth every 8 (eight) hours as needed for muscle spasms.   1  . estradiol (ESTRACE) 0.5 MG tablet Take 1 tablet (0.5 mg total) by mouth at bedtime. (Patient not taking: Reported on 08/07/2019) 90 tablet 3   No facility-administered medications prior to visit.    Allergies  Allergen Reactions  . Gabapentin Itching  . Sulfonamide Derivatives Nausea Only    REACTION: Nausea    ROS As per HPI  PE: Vitals with BMI 09/04/2019 08/07/2019 07/07/2019  Height 5\' 2"  5\' 2"  5\' 2"   Weight 201 lbs 3 oz 204 lbs 6 oz 204 lbs  BMI 36.79 29.52 84.1  Systolic 324 401 027  Diastolic 69 73 90  Pulse 75 72 96    Gen: Alert, well appearing.  Patient is oriented to person, place, time, and situation. AFFECT: pleasant, lucid thought and speech. CV: RRR, no m/r/g.   LUNGS: CTA bilat, nonlabored resps, good aeration in all lung fields.   LABS:  Lab Results  Component Value Date   TSH 3.45 02/06/2019   Lab Results  Component Value Date   WBC 8.9 05/09/2018   HGB 11.1 05/09/2018   HCT 32.5 (L) 05/09/2018   MCV 92 05/09/2018   PLT 327 05/09/2018   Lab Results  Component Value Date   CREATININE 0.81 07/03/2019   BUN 23 07/03/2019   NA 140 07/03/2019   K 4.0 07/03/2019   CL 100 07/03/2019   CO2 32 07/03/2019   Lab Results  Component Value Date   ALT 21 02/06/2019   AST 17 02/06/2019   ALKPHOS 98 02/06/2019   BILITOT 0.4 02/06/2019   Lab Results  Component Value Date   CHOL 161 02/06/2019   Lab Results  Component Value Date   HDL 44.70 02/06/2019   Lab Results  Component Value Date   LDLCALC 52  05/09/2018   Lab Results  Component Value Date   TRIG 307.0 (H) 02/06/2019   Lab Results  Component Value Date   CHOLHDL 4 02/06/2019   Lab Results  Component Value Date   HGBA1C 6.4 07/03/2019   POC CC dipstick UA today: she was unable to give a urine sample today.  IMPRESSION AND PLAN:  1) Urge incontinence: great response on daily myrbetriq 50 mg and oxybutynin xl 10mg  qd.  No adverse side effects. Recent mild sx's of UTI, cleared with 2d of cipro. UA today->she was unable to produce urine sample today.  2) HTN: The current medical regimen is effective;  continue present plan and medications. Lytes/cr stable 2 mo ago.  3) Grief rxn to husband's death: doing fine, grieving approp, has good family support.  4) Dysuria: possible mild UTI, she self-treated with some cipro she had at home. She could not produce a urine sample here today. Monitor for recurrence of sx's.  An After Visit Summary was printed and given to the patient.  FOLLOW UP: Return in about 6 weeks (around 10/16/2019) for routine chronic illness f/u.  Signed:  Crissie Sickles, MD  09/25/2019     

## 2019-09-08 DIAGNOSIS — E11621 Type 2 diabetes mellitus with foot ulcer: Secondary | ICD-10-CM | POA: Diagnosis not present

## 2019-09-23 LAB — HM DIABETES EYE EXAM

## 2019-09-30 ENCOUNTER — Encounter: Payer: Self-pay | Admitting: Family Medicine

## 2019-10-03 ENCOUNTER — Encounter: Payer: Self-pay | Admitting: Family Medicine

## 2019-10-03 NOTE — Telephone Encounter (Signed)
Pt was called and given all information, she verbalized understanding

## 2019-10-03 NOTE — Telephone Encounter (Signed)
Pt was called and said she is not having any vertigo. Has not drank that much liquid today (half a cup of water). Pt took Metoprolol one tablet two times daily and Hyzaar 1.5 tablets once daily. This afternoon pt just felt extra fatigued and it was 75/46 (wrist cuff). I had pt take her BP with me on the phone and BP was 136/54 Pulse 70 BPM.   Pt was encouraged to drink plenty of water, if BP dropped that low again and she lives alone she would need to call 911 and let EMS come to evaluate her. Pt advised I would send note to Dr [name].

## 2019-10-03 NOTE — Telephone Encounter (Signed)
Stop bp meds. Restart bp med one at a time if bp gets back up over 150/90. If further drop in bp <100/60 then call 911. Also, if any bad periods of SOB with minimal activity ---even if bp normal--then call 911.

## 2019-10-07 ENCOUNTER — Telehealth: Payer: Self-pay | Admitting: Family Medicine

## 2019-10-07 DIAGNOSIS — R0602 Shortness of breath: Secondary | ICD-10-CM | POA: Diagnosis not present

## 2019-10-07 DIAGNOSIS — J189 Pneumonia, unspecified organism: Secondary | ICD-10-CM | POA: Diagnosis not present

## 2019-10-07 NOTE — Telephone Encounter (Signed)
Spoke with patient and she followed PCP recommendations. She has since restarted all of her bp meds. Most recent reading was 151/76. She was seen at [facility] and determined to have pneumonia and given Rx.

## 2019-10-07 NOTE — Telephone Encounter (Signed)
Patient called in wanting to see provider today with blood pressure concerns and fatigue. States when she took her blood pressure this morning it was 167/57. Mentioned she just started taking her blood pressure medicine again this past Saturday. Since we did not have any openings I sent her over to nurse triage.

## 2019-10-07 NOTE — Telephone Encounter (Signed)
Sent as FYI. 

## 2019-10-07 NOTE — Telephone Encounter (Signed)
On 7/9 she called about a bp drop and I advised her to stop her bp meds and monitor bp and if it rises back over 140/90 then restart meds one at a time.  Did she restart any bp med(s)?

## 2019-10-07 NOTE — Telephone Encounter (Signed)
Patient Name: Katherine Best Gender: Female DOB: 1933/10/22 Age: 86 Y 48 M 15 D Return Phone Number: [phone] (Primary) Address: City/State/Zip: [place] [address] Client [facility] - Client Client Site [place] - Day Physician Crissie Sickles - MD Contact Type Call Who Is Calling Patient / Member / Family / Caregiver Call Type Triage / Clinical Relationship To Patient Self Return Phone Number [phone] (Primary) Chief Complaint Blood Pressure High Reason for Call Symptomatic / Request for [place] states she has Bp issues. It read 157/67. Translation No Nurse Assessment Nurse: Leilani Merl, RN, Heather Date/Time (Eastern Time): 10/07/2019 9:16:53 AM Confirm and document reason for call. If symptomatic, describe symptoms. ---Caller states she has BP issues. It read 157/63 this morning. She has had some fatigue with exertion for the last 5 days. Has the patient had close contact with a person known or suspected to have the novel coronavirus illness OR traveled / lives in area with major community spread (including international travel) in the last 14 days from the onset of symptoms? * If Asymptomatic, screen for exposure and travel within the last 14 days. ---No Does the patient have any new or worsening symptoms? ---Yes Will a triage be completed? ---Yes Related visit to physician within the last 2 weeks? ---No Does the PT have any chronic conditions? (i.e. diabetes, asthma, this includes High risk factors for pregnancy, etc.) ---Yes List chronic conditions. ---HTN, DM, Is this a behavioral health or substance abuse call? ---No Guidelines Guideline Title Affirmed Question Affirmed Notes Nurse Date/Time (Eastern Time) Blood Pressure - High [4] Systolic BP >= 825 OR Diastolic >= 80 AND [0] taking BP medications Standifer, RN, Nira Conn 10/07/2019 9:17:16 AM Weakness (Generalized) and  Fatigue [1] MODERATE weakness (i.e., interferes with work, school, normal activities) AND [2] cause unknown (Exceptions: weakness Standifer, RN, Nira Conn 10/07/2019 9:19:41 AMPLEASE NOTE: All timestamps contained within this report are represented as [place] Standard Time. CONFIDENTIALTY NOTICE: This fax transmission is intended only for the addressee. It contains information that is legally privileged, confidential or otherwise protected from use or disclosure. If you are not the intended recipient, you are strictly prohibited from reviewing, disclosing, copying using or disseminating any of this information or taking any action in reliance on or regarding this information. If you have received this fax in error, please notify us immediately by telephone so that we can arrange for its return to [place]. Phone: [phone], Toll-Free: [phone], Fax: [phone] Page: 2 of 2 Call Id: [id number] Guidelines Guideline Title Affirmed Question Affirmed Notes Nurse Date/Time Eilene Ghazi Time) with acute minor illness, or weakness from poor fluid intake) Disp. Time Eilene Ghazi Time) Disposition Final User 10/07/2019 9:19:28 AM See PCP within 2 Weeks Standifer, RN, Nira Conn 10/07/2019 9:23:24 AM See HCP within 4 Hours (or PCP triage) Yes Standifer, RN, Soyla Murphy Disagree/Comply Comply Caller Understands Yes PreDisposition Call Doctor Care Advice Given Per Guideline SEE PCP WITHIN 2 WEEKS: * You need to be seen for this ongoing problem within the next 2 weeks. CARE ADVICE given per High Blood Pressure (Adult) guideline. SEE HCP WITHIN 4 HOURS (OR PCP TRIAGE): CARE ADVICE given per Weakness and Fatigue (Adult) guideline. CALL BACK IF: * You become worse. Comments User: Ave Filter, RN Date/Time Eilene Ghazi Time): 10/07/2019 9:21:30 AM She has had the fatigue off and on for the last month and it is getting worse. Referrals GO TO FACILITY UNDECIDED

## 2019-10-07 NOTE — Telephone Encounter (Signed)
LM for pt to returncall

## 2019-10-08 NOTE — Telephone Encounter (Signed)
Noted  

## 2019-10-11 ENCOUNTER — Other Ambulatory Visit: Payer: Self-pay | Admitting: Family Medicine

## 2019-10-13 ENCOUNTER — Other Ambulatory Visit: Payer: Self-pay | Admitting: Family Medicine

## 2019-10-13 ENCOUNTER — Other Ambulatory Visit: Payer: Self-pay

## 2019-10-13 DIAGNOSIS — I10 Essential (primary) hypertension: Secondary | ICD-10-CM

## 2019-10-13 MED ORDER — LEVOTHYROXINE SODIUM 25 MCG PO TABS: 25.0000 ug | ORAL_TABLET | Freq: Every day | ORAL | 2 refills | Status: DC

## 2019-10-14 ENCOUNTER — Encounter: Payer: Self-pay | Admitting: Family Medicine

## 2019-10-16 DIAGNOSIS — M5136 Other intervertebral disc degeneration, lumbar region: Secondary | ICD-10-CM | POA: Diagnosis not present

## 2019-10-21 ENCOUNTER — Encounter: Payer: Self-pay | Admitting: Family Medicine

## 2019-10-21 ENCOUNTER — Other Ambulatory Visit: Payer: Self-pay

## 2019-10-21 ENCOUNTER — Ambulatory Visit (INDEPENDENT_AMBULATORY_CARE_PROVIDER_SITE_OTHER): Payer: Medicare Other | Admitting: Family Medicine

## 2019-10-21 VITALS — BP 126/77 | HR 75 | Temp 98.2°F | Resp 18 | Ht 62.0 in | Wt 202.1 lb

## 2019-10-21 DIAGNOSIS — R300 Dysuria: Secondary | ICD-10-CM | POA: Diagnosis not present

## 2019-10-21 DIAGNOSIS — R350 Frequency of micturition: Secondary | ICD-10-CM

## 2019-10-21 LAB — POCT URINALYSIS DIPSTICK
Bilirubin, UA: NEGATIVE
Blood, UA: NEGATIVE
Glucose, UA: NEGATIVE
Ketones, UA: NEGATIVE
Leukocytes, UA: NEGATIVE
Nitrite, UA: NEGATIVE
Protein, UA: NEGATIVE
Spec Grav, UA: >=1.03 — AB (ref 1.010–1.025)
Urobilinogen, UA: 0.2 E.U./dL
pH, UA: 5 (ref 5.0–8.0)

## 2019-10-21 MED ORDER — CIPROFLOXACIN HCL 250 MG PO TABS
250.0000 mg | ORAL_TABLET | Freq: Two times a day (BID) | ORAL | 0 refills | Status: DC
Start: 2019-10-21 — End: 2019-11-25

## 2019-10-21 NOTE — Progress Notes (Signed)
This visit occurred during the SARS-CoV-2 public health emergency.  Safety protocols were in place, including screening questions prior to the visit, additional usage of staff PPE, and extensive cleaning of exam room while observing appropriate contact time as indicated for disinfecting solutions.    Katherine Best , [birth year], 86 y.o., female MRN: [id number] Patient Care Team    Relationship Specialty Notifications Start End  [name], [name], MD PCP - General Family Medicine  09/02/14   Fay Records, MD PCP - Cardiology Cardiology Admissions 12/04/17   Collene Gobble, MD  Pulmonary Disease  07/10/12    Comment: Lia Hopping, MD Consulting Physician Physical Medicine and Rehabilitation  12/30/14   Paralee Cancel, MD Consulting Physician Orthopedic Surgery  12/30/14   Kathie Rhodes, MD Consulting Physician Urology  12/30/14   Darleen Crocker, MD Consulting Physician Ophthalmology  07/02/15   Chesley Mires, MD Consulting Physician Pulmonary Disease  07/17/16   Gatha Mayer, MD Consulting Physician Gastroenterology  07/25/16   Susa Day, MD Consulting Physician Orthopedic Surgery  03/29/17   Lavonna Monarch, MD Consulting Physician Dermatology  09/14/17   Nada Libman, MD Consulting Physician Ophthalmology  11/22/17   Fay Records, MD Consulting Physician Cardiology  12/05/17   Tat, Eustace Quail, DO Consulting Physician Neurology  12/11/18   Wylene Simmer, MD Consulting Physician Orthopedic Surgery  12/16/18   Melina Schools, MD Consulting Physician Orthopedic Surgery  01/01/19   Wylene Simmer, MD Consulting Physician Orthopedic Surgery  04/24/19   Wylene Simmer, MD Consulting Physician Orthopedic Surgery  09/30/19     Chief Complaint  Patient presents with  . Urinary Tract Infection    Burning and frequency x2 days.      Subjective: Pt presents for an OV with complaints of dysuria and urinary frequency of 2 days duration.  Associated symptoms include her back discomfort and  possibly chills.  Patient has a history of overactive bladder and is compliant with Myrbetriq and oxybutynin.  Depression screen [facility] 2/9 11/13/2018 04/24/2018 01/10/2018 10/10/2017 09/14/2017  Decreased Interest 1 1 2  0 0  Down, Depressed, Hopeless 2 1 0 0 0  PHQ - 2 Score 3 2 2  0 0  Altered sleeping 0 0 0 0 -  Tired, decreased energy 3 2 2  0 -  Change in appetite 0 0 0 0 -  Feeling bad or failure about yourself  0 0 0 0 -  Trouble concentrating 0 0 0 0 -  Moving slowly or fidgety/restless 0 1 0 0 -  Suicidal thoughts 0 0 0 0 -  PHQ-9 Score 6 5 4  0 -  Difficult doing work/chores Very difficult Not difficult at all Not difficult at all Not difficult at all -  Some recent data might be hidden    Allergies  Allergen Reactions  . Gabapentin Itching  . Sulfonamide Derivatives Nausea Only    REACTION: Nausea   Social History   Social History Narrative   Married '57, widowed 07/2019-->husband d cholangiocarcinoma, had moderate dementia.   3 sons- '60, '61, '64, grandchildren 55 (7 girls, 1 boy)   Lives independently.     Patient is a former smoker: quit 2004.   Alcohol use- yes socially not in years.   Illicit Drug use- no   Occupation: Biomedical scientist   Past Medical History:  Diagnosis Date  . Abdominal pain, right lower quadrant   . Cataracts, both eyes    soon to have cataract surg as  of 03/2015  . Chronic diastolic heart failure ([place]) 2019  . Chronic pain syndrome    low back->opioids managed by Dr. Nelva Bush.  . Diabetes mellitus with complication (HCC)    DPN.  NO diab retinopathy as of 06/2017 eye exam.  . Diabetic foot ulcer ([place]) 11/2018   R 3rd toe  . Diabetic peripheral neuropathy associated with type 2 diabetes mellitus ([place])    uncomplicated diabetic ulcer R 3rd toe-->MRI both feet Neg for osteo 10/2018  . Diverticulosis of colon    noted on colonoscopy 2008  . Dyspnea    a. 09/2011 CTA Chest: No PE, Ca2+ cors;  b. 09/2011 R&L heart Cath: Relatively nl R heart pressures,  nl co/ci, nonobs cad, nl EF.  12/2015 stress test normal, echo with grd II DD--likely explains DOE.  Worsening chronic DOE 2019-->cath w/no agiograph signif CAD.  Pulm HTN/DD.  EF 65%.  Cards started lasix.  Marland Kitchen External hemorrhoid   . Fatigue    w/ ? excessive daytime somnolence; ? OSA--eval by Dr. Halford Chessman 06/2016, home sleep study ordered.  . Former smoker    30 pack-yrs, quit 2005  . Heart murmur    hx of  . Hx of adenomatous polyp of colon 08/25/2016   07/2016 no recall  . Hyperlipemia, mixed    trigs remain elevated (200s-300s range), but LDL at goal.  . Hypertension   . Hypertensive heart disease 2019  . Hypothyroidism   . IBS (irritable bowel syndrome)    IBS-D (Dr. Carlean Purl) + ? worsened by GI side effect of metformin.  NO pancreatic insufficiency.  As per 09/2017 GI f/u, plan is to continue dicyclomine + f/u with them on prn basis  . Interstitial cystitis   . Liver function study, abnormal   . Lumbar degenerative disc disease    Lumb spondylosis & facet-mediated pain per Dr. Nelva Bush.  Has gotten repeated LB injections.  Eval by Dr. Tonita Cong 03/2017.  Spinal stenosis without neurogenic claudication.  RF neurotomy 05/2017 and 01/2018->on L and R L3 and L4 medial branch and L5 dorsal ramus nerves.  . Lumbar degenerative disc disease    Surgery not recommended, Dr. Rolena Infante and Dr. Ronnald Ramp.  Radiofreqency neuronotomy 04/18/19, Dr. Nelva Bush.  . Lumbar scoliosis    lumbosacral spondylosis w/out myelopathy (radiofrequency ablation/lesioning procedure planned for 05/2017).  . Macular degeneration    OS exudative (avastin treatments).  Advanced nonexudative OD, monitor.  AREDS  . Macular hole 07/05/2017   OD; surgically repaired 07/2017.  . Menopausal syndrome    restarted estradiol 2016  . Morton's neuroma   . Obesity, Class II, BMI 35-39.9   . OSA on CPAP 07/2016   HST 07/27/16 >> AHI 6.2, SaO2 low 79%. Did not tolerate CPAP.  Pt to try again as of 12/2017.  Compliant with CPAP as of 02/2018 pulm f/u.  .  Osteoarthritis, multiple sites 2019   HIPs->surgery NOT recommended ([facility]).  +bilat feet.  . Other specified gastritis without mention of hemorrhage    hx of antral gastritis on EGD 05/2009  . Parkinson's disease ([place]) 2020   Carb/lev started by Dr. Carles Collet 08/2018  . Peripheral neuropathy    ? DPN?--per EMG 11/2012.  Intolerant of gabapentin, failed pamelor.  Radiofrequency neurotomy 12/11/14 helped LB pain (Dr. Nelva Bush)  . Pneumonia   . RLS (restless legs syndrome)   . Tarsal tunnel syndrome   . Trochanteric bursitis of both hips 2019   Injections: [facility]  . Unspecified venous (peripheral) insufficiency    Past  Surgical History:  Procedure Laterality Date  . ABI/Arterial eval  02/25/2019   normal ankle arm index and normal triphasic waveforms bilat->no PAD->reassured by vasc surgeon  . ANKLE SURGERY     bilateral  . CARDIAC CATHETERIZATION  09/2011;11/2017   2013: Nonobstructive CAD. 2019: mild pulmonary hypertension. LV end diastolic pressure is moderately elevated.  EF >65%.  DD/hypertensive heart dz-->BP control and add ASA 81mg  + lasix qd.  . CARDIOVASCULAR STRESS TEST  01/04/2016   Normal stress nuclear study with no ischemia or infarction; EF 82 with normal wall motion  . CATARACT EXTRACTION    . COLONOSCOPY  2002/2005, 11/14/06, and 05/2009   polyps 2002 but none 2005 or 2008 or 2011.  Repeat 08/17/16 for diarrhea: one diminutive adenoma + small polyps removed, diverticulosis noted--no further colonoscopies needed (Dr. Carlean Purl).  . CT angiogram chest  11/05/2017   NEG for PE.  Marland Kitchen DEXA  10/2016   NORMAL--consider repeat 2 yrs.  . ESOPHAGOGASTRODUODENOSCOPY  05/2009   Moderate antral gastritis  . FOOT SURGERY     R foot bunionectomy and arthroplasty of digits R foot.  Arthroplasty digits 3 and 4 L foot.  Marland Kitchen HAMMER TOE SURGERY     bilateral  . left tarsal tunnel release     sept '11 (Dr Beola Cord)  . PARTIAL KNEE ARTHROPLASTY Right 11/06/2016   Procedure: UNICOMPARTMENTAL RIGHT  KNEE- Medially;  Surgeon: Paralee Cancel, MD;  Location: [facility];  Service: Orthopedics;  Laterality: Right;  90 mins  . PFTs  12/05/2017   no obstruction, mild restriction. No reversibility. Moderate DLCO defect --continue prn albut  . radiofrequency neurotomy  06/18/2015   bilat L3-4 medial branch nerve and bilat L5 dorsal ramus nerve.(Dr. Nelva Bush)  . RIGHT/LEFT HEART CATH AND CORONARY ANGIOGRAPHY N/A 12/07/2017   Procedure: RIGHT/LEFT HEART CATH AND CORONARY ANGIOGRAPHY;  Surgeon: Leonie Man, MD;  Location: [place] [place] [facility];  Service: Cardiovascular;  Laterality: N/A;  . TOE AMPUTATION  08/2019   Right 3rd toe  . TONSILLECTOMY    . TOTAL ABDOMINAL HYSTERECTOMY W/ BILATERAL SALPINGOOPHORECTOMY    . TRANSTHORACIC ECHOCARDIOGRAM  08/2011; 2017   2013 Grade I DD; 2017 EF 60-65%, normal wall motion, grd II DD.   Family History  Problem Relation Age of Onset  . Cancer Mother        Breast  . Breast cancer Mother   . Hypertension Father   . Hyperlipidemia Father   . Diabetes Maternal Grandfather   . Mental illness Brother   . Colon cancer Neg Hx    Allergies as of 10/21/2019      Reactions   Gabapentin Itching   Sulfonamide Derivatives Nausea Only   REACTION: Nausea      Medication List       Accurate as of October 21, 2019  4:13 PM. If you have any questions, ask your nurse or doctor.        ALPRAZolam 0.25 MG tablet Commonly known as: XANAX Take 1 tablet (0.25 mg total) by mouth at bedtime as needed for anxiety.   aspirin EC 81 MG tablet Take 81 mg by mouth daily.   atorvastatin 20 MG tablet Commonly known as: LIPITOR TAKE ONE TABLET BY MOUTH DAILY   azelastine 0.1 % nasal spray Commonly known as: ASTELIN 2 sprays each nostril 20-30 min prior to wearing CPAP   Calcium 600 600 MG Tabs tablet Generic drug: calcium carbonate Take 600 mg by mouth daily.   carbidopa-levodopa 25-100 MG tablet Commonly known  as: SINEMET IR TAKE ONE TABLET BY MOUTH THREE TIMES DAILY     celecoxib 200 MG capsule Commonly known as: CELEBREX TAKE ONE CAPSULE BY MOUTH DAILY   cephALEXin 500 MG capsule Commonly known as: KEFLEX Take 500 mg by mouth 4 (four) times daily.   diclofenac sodium 1 % Gel Commonly known as: VOLTAREN Apply 2 g topically 4 (four) times daily.   dicyclomine 10 MG capsule Commonly known as: BENTYL Take 1 tab 2-3 times daily for diarrhea/urgency.   DULoxetine 60 MG capsule Commonly known as: CYMBALTA TAKE ONE CAPSULE BY MOUTH EVERY DAY   estradiol 0.5 MG tablet Commonly known as: ESTRACE Take 1 tablet (0.5 mg total) by mouth at bedtime.   ferrous sulfate 325 (65 FE) MG tablet Take 1 tablet (325 mg total) by mouth 2 (two) times daily with a meal.   furosemide 20 MG tablet Commonly known as: LASIX As directed, one tablet once a week   glucose blood test strip OneTouch Verio test strips  USE TO CHECK BLOOD ONCE DAILY   glucose blood test strip Use to check blood once daily   HYDROcodone-acetaminophen 5-325 MG tablet Commonly known as: NORCO/VICODIN Take 1 tablet by mouth 3 (three) times daily as needed.   Lancets 30G Misc Use to check blood sugar once daily   levothyroxine 25 MCG tablet Commonly known as: SYNTHROID Take 1 tablet (25 mcg total) by mouth daily.   loratadine 10 MG tablet Commonly known as: CLARITIN Take 10 mg by mouth daily as needed for allergies.   losartan-hydrochlorothiazide 50-12.5 MG tablet Commonly known as: HYZAAR TAKE ONE & ONE-HALF TABLETS BY MOUTH EVERY DAY   metoprolol tartrate 50 MG tablet Commonly known as: LOPRESSOR TAKE ONE TABLET BY MOUTH TWICE DAILY   mirtazapine 15 MG tablet Commonly known as: REMERON TAKE 1/2 TABLET BY MOUTH AT BEDTIME   MULTIVITAMIN ADULT PO Take 1 tablet by mouth daily.   PRESERVISION AREDS 2 PO Take 1 tablet by mouth daily.   Myrbetriq 50 MG Tb24 tablet Generic drug: mirabegron ER Take 1 tablet (50 mg total) by mouth daily.   omeprazole 20 MG  capsule Commonly known as: PRILOSEC Take 1 capsule (20 mg total) by mouth daily.   ondansetron 4 MG tablet Commonly known as: ZOFRAN Take 1 tablet by mouth as needed.   oxybutynin 10 MG 24 hr tablet Commonly known as: DITROPAN-XL Take 1 tablet (10 mg total) by mouth daily as needed (urine frequency).   oxyCODONE-acetaminophen 5-325 MG tablet Commonly known as: PERCOCET/ROXICET Take 1 tablet by mouth 4 (four) times daily as needed.   pioglitazone 30 MG tablet Commonly known as: ACTOS TAKE ONE TABLET BY MOUTH DAILY   potassium chloride SA 20 MEQ tablet Commonly known as: KLOR-CON TAKE ONE TABLET BY MOUTH EVERY DAY   pregabalin 50 MG capsule Commonly known as: LYRICA TAKE ONE CAPSULE BY MOUTH TWICE DAILY   tiZANidine 4 MG tablet Commonly known as: ZANAFLEX Take 4 mg by mouth every 8 (eight) hours as needed for muscle spasms.       All past medical history, surgical history, allergies, family history, immunizations andmedications were updated in the EMR today and reviewed under the history and medication portions of their EMR.     ROS: Negative, with the exception of above mentioned in HPI   Objective:  BP 126/77 (BP Location: Right Arm, Patient Position: Sitting, Cuff Size: Normal)   Pulse 75   Temp 98.2 F (36.8 C) (Temporal)   Resp 18  Ht 5\' 2"  (1.575 m)   Wt 202 lb 2 oz (91.7 kg)   SpO2 99%   BMI 36.97 kg/m  Body mass index is 36.97 kg/m. Gen: Afebrile. No acute distress. Nontoxic in appearance, well developed, well nourished.  HENT: AT. [place]. Chest: CTAB.  No wheezing.  No crackles.  Good air movement.  Normal respiratory effort Abd: Soft.obese NTND. BS present.  No guarding. MSK: No CVA tenderness.  Neuro: Alert. Oriented x3  Psych: Normal affect, dress and demeanor. Normal speech. Normal thought content and judgment.  No exam data present No results found. Results for orders placed or performed in visit on 10/21/19 (from the past 24 hour(s))  POCT  urinalysis dipstick     Status: Abnormal   Collection Time: 10/21/19  4:09 PM  Result Value Ref Range   Color, UA Yellow    Clarity, UA Clear    Glucose, UA Negative Negative   Bilirubin, UA Negative    Ketones, UA Negative    Spec Grav, UA >=1.030 (A) 1.010 - 1.025   Blood, UA Negative    pH, UA 5.0 5.0 - 8.0   Protein, UA Negative Negative   Urobilinogen, UA 0.2 0.2 or 1.0 E.U./dL   Nitrite, UA Negative    Leukocytes, UA Negative Negative   Appearance Clear    Odor No     Assessment/Plan: Derry Skill Haston is a 86 y.o. female present for OV for  Urinary frequency/dysuria Hydrate. Cipro 250 mg twice daily x3 days for prophylaxis Urinalysis did not appear infectious today we will send for urine culture to be complete. She is taking her Myrbetriq and oxybutynin as prescribed. Of note she was recently diagnosed with pneumonia from urgent care: Her lung exam is clear today.  99% oxygen saturation on room air.  Had been 93% at the urgent care.  She is feeling much better. - Urinalysis w microscopic + reflex cultur - POCT urinalysis dipstick   Reviewed expectations re: course of current medical issues.  Discussed self-management of symptoms.  Outlined signs and symptoms indicating need for more acute intervention.  Patient verbalized understanding and all questions were answered.  Patient received an After-Visit Summary.    Orders Placed This Encounter  Procedures  . Urinalysis w microscopic + reflex cultur  . POCT Urinalysis Dipstick (Automated)  . POCT urinalysis dipstick   No orders of the defined types were placed in this encounter.  Referral Orders  No referral(s) requested today     Note is dictated utilizing voice recognition software. Although note has been proof read prior to signing, occasional typographical errors still can be missed. If any questions arise, please do not hesitate to call for verification.   electronically signed by:  Howard Pouch, DO   [place]

## 2019-10-21 NOTE — Patient Instructions (Signed)
Rest and hydrate Take cipro every 12 hours for 3 days.  We will call you with lab results once we get them back.    Urinary Tract Infection, Adult A urinary tract infection (UTI) is an infection of any part of the urinary tract. The urinary tract includes:  The kidneys.  The ureters.  The bladder.  The urethra. These organs make, store, and get rid of pee (urine) in the body. What are the causes? This is caused by germs (bacteria) in your genital area. These germs grow and cause swelling (inflammation) of your urinary tract. What increases the risk? You are more likely to develop this condition if:  You have a small, thin tube (catheter) to drain pee.  You cannot control when you pee or poop (incontinence).  You are female, and: ? You use these methods to prevent pregnancy:  A medicine that kills sperm (spermicide).  A device that blocks sperm (diaphragm). ? You have low levels of a female hormone (estrogen). ? You are pregnant.  You have genes that add to your risk.  You are sexually active.  You take antibiotic medicines.  You have trouble peeing because of: ? A prostate that is bigger than normal, if you are female. ? A blockage in the part of your body that drains pee from the bladder (urethra). ? A kidney stone. ? A nerve condition that affects your bladder (neurogenic bladder). ? Not getting enough to drink. ? Not peeing often enough.  You have other conditions, such as: ? Diabetes. ? A weak disease-fighting system (immune system). ? Sickle cell disease. ? Gout. ? Injury of the spine. What are the signs or symptoms? Symptoms of this condition include:  Needing to pee right away (urgently).  Peeing often.  Peeing small amounts often.  Pain or burning when peeing.  Blood in the pee.  Pee that smells bad or not like normal.  Trouble peeing.  Pee that is cloudy.  Fluid coming from the vagina, if you are female.  Pain in the belly or lower  back. Other symptoms include:  Throwing up (vomiting).  No urge to eat.  Feeling mixed up (confused).  Being tired and grouchy (irritable).  A fever.  Watery poop (diarrhea). How is this treated? This condition may be treated with:  Antibiotic medicine.  Other medicines.  Drinking enough water. Follow these instructions at home:  Medicines  Take over-the-counter and prescription medicines only as told by your doctor.  If you were prescribed an antibiotic medicine, take it as told by your doctor. Do not stop taking it even if you start to feel better. General instructions  Make sure you: ? Pee until your bladder is empty. ? Do not hold pee for a long time. ? Empty your bladder after sex. ? Wipe from front to back after pooping if you are a female. Use each tissue one time when you wipe.  Drink enough fluid to keep your pee pale yellow.  Keep all follow-up visits as told by your doctor. This is important. Contact a doctor if:  You do not get better after 1-2 days.  Your symptoms go away and then come back. Get help right away if:  You have very bad back pain.  You have very bad pain in your lower belly.  You have a fever.  You are sick to your stomach (nauseous).  You are throwing up. Summary  A urinary tract infection (UTI) is an infection of any part of the urinary  tract.  This condition is caused by germs in your genital area.  There are many risk factors for a UTI. These include having a small, thin tube to drain pee and not being able to control when you pee or poop.  Treatment includes antibiotic medicines for germs.  Drink enough fluid to keep your pee pale yellow. This information is not intended to replace advice given to you by your health care provider. Make sure you discuss any questions you have with your health care provider. Document Revised: 02/28/2018 Document Reviewed: 09/20/2017 Elsevier Patient Education  2020 [employer].

## 2019-10-23 LAB — URINE CULTURE
MICRO NUMBER:: 10756530
Result:: NO GROWTH
SPECIMEN QUALITY:: ADEQUATE

## 2019-10-23 LAB — URINALYSIS W MICROSCOPIC + REFLEX CULTURE
Bacteria, UA: NONE SEEN /HPF
Bilirubin Urine: NEGATIVE
Glucose, UA: NEGATIVE
Hgb urine dipstick: NEGATIVE
Hyaline Cast: NONE SEEN /LPF
Nitrites, Initial: NEGATIVE
Protein, ur: NEGATIVE
Specific Gravity, Urine: 1.023 (ref 1.001–1.03)
pH: <=5 (ref 5.0–8.0)

## 2019-10-23 LAB — CULTURE INDICATED

## 2019-10-27 ENCOUNTER — Encounter: Payer: Self-pay | Admitting: Family Medicine

## 2019-10-27 NOTE — Telephone Encounter (Signed)
Ok to put on with me next week.

## 2019-10-27 NOTE — Telephone Encounter (Signed)
Patient sees Dr.Ross for cardiology. Please advise if appt with you needed or Dr.Ross, thanks.

## 2019-10-28 DIAGNOSIS — R0602 Shortness of breath: Secondary | ICD-10-CM | POA: Diagnosis not present

## 2019-10-28 DIAGNOSIS — J189 Pneumonia, unspecified organism: Secondary | ICD-10-CM | POA: Diagnosis not present

## 2019-11-03 ENCOUNTER — Encounter: Payer: Self-pay | Admitting: Family Medicine

## 2019-11-03 ENCOUNTER — Other Ambulatory Visit: Payer: Self-pay | Admitting: Family Medicine

## 2019-11-05 ENCOUNTER — Ambulatory Visit: Payer: Medicare Other | Admitting: Family Medicine

## 2019-11-05 NOTE — Progress Notes (Deleted)
OFFICE VISIT  11/05/2019   CC: No chief complaint on file.  HPI:    Patient is a 86 y.o. Caucasian female who presents for f/u OAB, HTN, hx of dysuria (? UTI x 2 the last few mo), DM 2 with DPN and hx of toe amputation. She has chronic diastolic HF with chronic venous insufficiency as well.   Has anxiety, affects sleep particularly, hx of prn use of low dose alprazolam has helped. PMP AWARE reviewed today: most recent rx for *** was filled ***, # ***, rx by ***. No red flags.  Past Medical History:  Diagnosis Date  . Abdominal pain, right lower quadrant   . Cataracts, both eyes    soon to have cataract surg as of 03/2015  . Chronic diastolic heart failure ([place]) 2019  . Chronic pain syndrome    low back->opioids managed by Dr. Nelva Bush.  . Diabetes mellitus with complication (HCC)    DPN.  NO diab retinopathy as of 06/2017 eye exam.  . Diabetic foot ulcer ([place]) 11/2018   R 3rd toe  . Diabetic peripheral neuropathy associated with type 2 diabetes mellitus ([place])    uncomplicated diabetic ulcer R 3rd toe-->MRI both feet Neg for osteo 10/2018  . Diverticulosis of colon    noted on colonoscopy 2008  . Dyspnea    a. 09/2011 CTA Chest: No PE, Ca2+ cors;  b. 09/2011 R&L heart Cath: Relatively nl R heart pressures, nl co/ci, nonobs cad, nl EF.  12/2015 stress test normal, echo with grd II DD--likely explains DOE.  Worsening chronic DOE 2019-->cath w/no agiograph signif CAD.  Pulm HTN/DD.  EF 65%.  Cards started lasix.  Marland Kitchen External hemorrhoid   . Fatigue    w/ ? excessive daytime somnolence; ? OSA--eval by Dr. Halford Chessman 06/2016, home sleep study ordered.  . Former smoker    30 pack-yrs, quit 2005  . Heart murmur    hx of  . Hx of adenomatous polyp of colon 08/25/2016   07/2016 no recall  . Hyperlipemia, mixed    trigs remain elevated (200s-300s range), but LDL at goal.  . Hypertension   . Hypertensive heart disease 2019  . Hypothyroidism   . IBS (irritable bowel syndrome)    IBS-D (Dr. Carlean Purl) +  ? worsened by GI side effect of metformin.  NO pancreatic insufficiency.  As per 09/2017 GI f/u, plan is to continue dicyclomine + f/u with them on prn basis  . Interstitial cystitis   . Liver function study, abnormal   . Lumbar degenerative disc disease    Lumb spondylosis & facet-mediated pain per Dr. Nelva Bush.  Has gotten repeated LB injections.  Eval by Dr. Tonita Cong 03/2017.  Spinal stenosis without neurogenic claudication.  RF neurotomy 05/2017 and 01/2018->on L and R L3 and L4 medial branch and L5 dorsal ramus nerves.  . Lumbar degenerative disc disease    Surgery not recommended, Dr. Rolena Infante and Dr. Ronnald Ramp.  Radiofreqency neuronotomy 04/18/19, Dr. Nelva Bush.  . Lumbar scoliosis    lumbosacral spondylosis w/out myelopathy (radiofrequency ablation/lesioning procedure planned for 05/2017).  . Macular degeneration    OS exudative (avastin treatments).  Advanced nonexudative OD, monitor.  AREDS  . Macular hole 07/05/2017   OD; surgically repaired 07/2017.  . Menopausal syndrome    restarted estradiol 2016  . Morton's neuroma   . Obesity, Class II, BMI 35-39.9   . OSA on CPAP 07/2016   HST 07/27/16 >> AHI 6.2, SaO2 low 79%. Did not tolerate CPAP.  Pt to try  again as of 12/2017.  Compliant with CPAP as of 02/2018 pulm f/u.  . Osteoarthritis, multiple sites 2019   HIPs->surgery NOT recommended ([facility]).  +bilat feet.  . Other specified gastritis without mention of hemorrhage    hx of antral gastritis on EGD 05/2009  . Parkinson's disease ([place]) 2020   Carb/lev started by Dr. Carles Collet 08/2018  . Peripheral neuropathy    ? DPN?--per EMG 11/2012.  Intolerant of gabapentin, failed pamelor.  Radiofrequency neurotomy 12/11/14 helped LB pain (Dr. Nelva Bush)  . Pneumonia   . RLS (restless legs syndrome)   . Tarsal tunnel syndrome   . Trochanteric bursitis of both hips 2019   Injections: [facility]  . Unspecified venous (peripheral) insufficiency     Past Surgical History:  Procedure Laterality Date  . ABI/Arterial  eval  02/25/2019   normal ankle arm index and normal triphasic waveforms bilat->no PAD->reassured by vasc surgeon  . ANKLE SURGERY     bilateral  . CARDIAC CATHETERIZATION  09/2011;11/2017   2013: Nonobstructive CAD. 2019: mild pulmonary hypertension. LV end diastolic pressure is moderately elevated.  EF >65%.  DD/hypertensive heart dz-->BP control and add ASA 81mg  + lasix qd.  . CARDIOVASCULAR STRESS TEST  01/04/2016   Normal stress nuclear study with no ischemia or infarction; EF 82 with normal wall motion  . CATARACT EXTRACTION    . COLONOSCOPY  2002/2005, 11/14/06, and 05/2009   polyps 2002 but none 2005 or 2008 or 2011.  Repeat 08/17/16 for diarrhea: one diminutive adenoma + small polyps removed, diverticulosis noted--no further colonoscopies needed (Dr. Carlean Purl).  . CT angiogram chest  11/05/2017   NEG for PE.  Marland Kitchen DEXA  10/2016   NORMAL--consider repeat 2 yrs.  . ESOPHAGOGASTRODUODENOSCOPY  05/2009   Moderate antral gastritis  . FOOT SURGERY     R foot bunionectomy and arthroplasty of digits R foot.  Arthroplasty digits 3 and 4 L foot.  Marland Kitchen HAMMER TOE SURGERY     bilateral  . left tarsal tunnel release     sept '11 (Dr Beola Cord)  . PARTIAL KNEE ARTHROPLASTY Right 11/06/2016   Procedure: UNICOMPARTMENTAL RIGHT KNEE- Medially;  Surgeon: Paralee Cancel, MD;  Location: [facility];  Service: Orthopedics;  Laterality: Right;  90 mins  . PFTs  12/05/2017   no obstruction, mild restriction. No reversibility. Moderate DLCO defect --continue prn albut  . radiofrequency neurotomy  06/18/2015   bilat L3-4 medial branch nerve and bilat L5 dorsal ramus nerve.(Dr. Nelva Bush)  . RIGHT/LEFT HEART CATH AND CORONARY ANGIOGRAPHY N/A 12/07/2017   Procedure: RIGHT/LEFT HEART CATH AND CORONARY ANGIOGRAPHY;  Surgeon: Leonie Man, MD;  Location: [place] [facility];  Service: Cardiovascular;  Laterality: N/A;  . TOE AMPUTATION  08/2019   Right 3rd toe distal phalanx due to non-healing ulcer  . TONSILLECTOMY    . TOTAL  ABDOMINAL HYSTERECTOMY W/ BILATERAL SALPINGOOPHORECTOMY    . TRANSTHORACIC ECHOCARDIOGRAM  08/2011; 2017   2013 Grade I DD; 2017 EF 60-65%, normal wall motion, grd II DD.    Outpatient Medications Prior to Visit  Medication Sig Dispense Refill  . ALPRAZolam (XANAX) 0.25 MG tablet Take 1 tablet (0.25 mg total) by mouth at bedtime as needed for anxiety. 30 tablet 1  . aspirin EC 81 MG tablet Take 81 mg by mouth daily.    Marland Kitchen atorvastatin (LIPITOR) 20 MG tablet TAKE ONE TABLET BY MOUTH EVERY DAY 30 tablet 0  . azelastine (ASTELIN) 0.1 % nasal spray 2 sprays each nostril 20-30 min prior to wearing  CPAP 30 mL 12  . calcium carbonate (CALCIUM 600) 600 MG TABS tablet Take 600 mg by mouth daily.     . carbidopa-levodopa (SINEMET IR) 25-100 MG tablet TAKE ONE TABLET BY MOUTH THREE TIMES DAILY 270 tablet 1  . celecoxib (CELEBREX) 200 MG capsule TAKE ONE CAPSULE BY MOUTH DAILY 90 capsule 0  . cephALEXin (KEFLEX) 500 MG capsule Take 500 mg by mouth 4 (four) times daily.    . ciprofloxacin (CIPRO) 250 MG tablet Take 1 tablet (250 mg total) by mouth 2 (two) times daily. 6 tablet 0  . diclofenac sodium (VOLTAREN) 1 % GEL Apply 2 g topically 4 (four) times daily.   1  . dicyclomine (BENTYL) 10 MG capsule Take 1 tab 2-3 times daily for diarrhea/urgency. 90 capsule 6  . DULoxetine (CYMBALTA) 60 MG capsule TAKE ONE CAPSULE BY MOUTH EVERY DAY 90 capsule 0  . estradiol (ESTRACE) 0.5 MG tablet Take 1 tablet (0.5 mg total) by mouth at bedtime. 90 tablet 3  . ferrous sulfate 325 (65 FE) MG tablet Take 1 tablet (325 mg total) by mouth 2 (two) times daily with a meal. 30 tablet 0  . furosemide (LASIX) 20 MG tablet As directed, one tablet once a week 45 tablet 0  . glucose blood test strip Use to check blood once daily 100 each 11  . glucose blood test strip OneTouch Verio test strips  USE TO CHECK BLOOD ONCE DAILY    . HYDROcodone-acetaminophen (NORCO/VICODIN) 5-325 MG tablet Take 1 tablet by mouth 3 (three) times daily  as needed.  0  . Lancets 30G MISC Use to check blood sugar once daily 100 each 11  . levothyroxine (SYNTHROID) 25 MCG tablet Take 1 tablet (25 mcg total) by mouth daily. 30 tablet 2  . loratadine (CLARITIN) 10 MG tablet Take 10 mg by mouth daily as needed for allergies.    Marland Kitchen losartan-hydrochlorothiazide (HYZAAR) 50-12.5 MG tablet TAKE ONE & ONE-HALF TABLETS BY MOUTH EVERY DAY 45 tablet 1  . metoprolol tartrate (LOPRESSOR) 50 MG tablet TAKE ONE TABLET BY MOUTH TWICE DAILY 180 tablet 1  . mirtazapine (REMERON) 15 MG tablet TAKE 1/2 TABLET BY MOUTH AT BEDTIME 45 tablet 1  . Multiple Vitamins-Minerals (MULTIVITAMIN ADULT PO) Take 1 tablet by mouth daily.     . Multiple Vitamins-Minerals (PRESERVISION AREDS 2 PO) Take 1 tablet by mouth daily.     Marland Kitchen MYRBETRIQ 50 MG TB24 tablet Take 1 tablet (50 mg total) by mouth daily. 30 tablet 6  . omeprazole (PRILOSEC) 20 MG capsule Take 1 capsule (20 mg total) by mouth daily. 90 capsule 1  . ondansetron (ZOFRAN) 4 MG tablet Take 1 tablet by mouth as needed.    Marland Kitchen oxybutynin (DITROPAN-XL) 10 MG 24 hr tablet Take 1 tablet (10 mg total) by mouth daily as needed (urine frequency). 30 tablet 6  . oxyCODONE-acetaminophen (PERCOCET/ROXICET) 5-325 MG tablet Take 1 tablet by mouth 4 (four) times daily as needed.    . pioglitazone (ACTOS) 30 MG tablet TAKE ONE TABLET BY MOUTH DAILY 90 tablet 0  . potassium chloride SA (KLOR-CON) 20 MEQ tablet TAKE ONE TABLET BY MOUTH EVERY DAY 90 tablet 1  . pregabalin (LYRICA) 50 MG capsule TAKE ONE CAPSULE BY MOUTH TWICE DAILY 180 capsule 0  . tiZANidine (ZANAFLEX) 4 MG tablet Take 4 mg by mouth every 8 (eight) hours as needed for muscle spasms.   1   No facility-administered medications prior to visit.    Allergies  Allergen  Reactions  . Gabapentin Itching  . Sulfonamide Derivatives Nausea Only    REACTION: Nausea    ROS As per HPI  PE: There were no vitals taken for this visit. ***  LABS:  Lab Results  Component Value  Date   TSH 3.45 02/06/2019   Lab Results  Component Value Date   WBC 8.9 05/09/2018   HGB 11.1 05/09/2018   HCT 32.5 (L) 05/09/2018   MCV 92 05/09/2018   PLT 327 05/09/2018   Lab Results  Component Value Date   CREATININE 0.81 07/03/2019   BUN 23 07/03/2019   NA 140 07/03/2019   K 4.0 07/03/2019   CL 100 07/03/2019   CO2 32 07/03/2019   Lab Results  Component Value Date   ALT 21 02/06/2019   AST 17 02/06/2019   ALKPHOS 98 02/06/2019   BILITOT 0.4 02/06/2019   Lab Results  Component Value Date   CHOL 161 02/06/2019   Lab Results  Component Value Date   HDL 44.70 02/06/2019   Lab Results  Component Value Date   LDLCALC 52 05/09/2018   Lab Results  Component Value Date   TRIG 307.0 (H) 02/06/2019   Lab Results  Component Value Date   CHOLHDL 4 02/06/2019   Lab Results  Component Value Date   HGBA1C 6.4 07/03/2019    IMPRESSION AND PLAN:  No problem-specific Assessment & Plan notes found for this encounter.   An After Visit Summary was printed and given to the patient.  FOLLOW UP: No follow-ups on file.  Signed:  Crissie Sickles, MD           11/05/2019

## 2019-11-10 ENCOUNTER — Other Ambulatory Visit: Payer: Self-pay | Admitting: Family Medicine

## 2019-11-11 NOTE — Telephone Encounter (Signed)
pregabalin and remeron eRx'd Needs f/u 3 mo.

## 2019-11-11 NOTE — Telephone Encounter (Signed)
Patient advised refills sent.

## 2019-11-13 DIAGNOSIS — E1351 Other specified diabetes mellitus with diabetic peripheral angiopathy without gangrene: Secondary | ICD-10-CM | POA: Diagnosis not present

## 2019-11-13 DIAGNOSIS — L602 Onychogryphosis: Secondary | ICD-10-CM | POA: Diagnosis not present

## 2019-11-17 ENCOUNTER — Other Ambulatory Visit: Payer: Self-pay | Admitting: Family Medicine

## 2019-11-25 ENCOUNTER — Encounter: Payer: Self-pay | Admitting: Family Medicine

## 2019-11-25 ENCOUNTER — Ambulatory Visit (INDEPENDENT_AMBULATORY_CARE_PROVIDER_SITE_OTHER): Payer: Medicare Other | Admitting: Family Medicine

## 2019-11-25 ENCOUNTER — Other Ambulatory Visit: Payer: Self-pay

## 2019-11-25 VITALS — BP 129/76 | HR 69 | Temp 97.4°F | Resp 16 | Wt 204.2 lb

## 2019-11-25 DIAGNOSIS — I10 Essential (primary) hypertension: Secondary | ICD-10-CM

## 2019-11-25 DIAGNOSIS — N3941 Urge incontinence: Secondary | ICD-10-CM

## 2019-11-25 DIAGNOSIS — E1149 Type 2 diabetes mellitus with other diabetic neurological complication: Secondary | ICD-10-CM | POA: Diagnosis not present

## 2019-11-25 DIAGNOSIS — E7800 Pure hypercholesterolemia, unspecified: Secondary | ICD-10-CM

## 2019-11-25 DIAGNOSIS — E039 Hypothyroidism, unspecified: Secondary | ICD-10-CM | POA: Diagnosis not present

## 2019-11-25 LAB — BASIC METABOLIC PANEL
BUN: 29 mg/dL — ABNORMAL HIGH (ref 6–23)
CO2: 31 mEq/L (ref 19–32)
Calcium: 9.9 mg/dL (ref 8.4–10.5)
Chloride: 99 mEq/L (ref 96–112)
Creatinine, Ser: 0.82 mg/dL (ref 0.40–1.20)
GFR: 66.46 mL/min (ref >60.00)
Glucose, Bld: 175 mg/dL — ABNORMAL HIGH (ref 70–99)
Potassium: 4.4 mEq/L (ref 3.5–5.1)
Sodium: 139 mEq/L (ref 135–145)

## 2019-11-25 LAB — HEMOGLOBIN A1C: Hgb A1c MFr Bld: 6.3 % (ref 4.6–6.5)

## 2019-11-25 NOTE — Progress Notes (Signed)
OFFICE VISIT  11/25/2019  CC:  Chief Complaint  Patient presents with  . Follow-up    rci   HPI:    Patient is a 86 y.o. Caucasian female who presents for 2.5 mo f/u DM 2, HTN, HLD, and hypothyroidism. She has chronic fatigue at least partially explained by OSA and chronic lumbar pain syndrome. She has hypertensive heart dz, chronic diastolic HF.  Recently had toe amputation for nonhealing ulcer 08/2019. Has chronic anx/dep as well.  Uses a cane to walk all the time when out of home, uses walker in home.    A/P as of last visit: " 1) Urge incontinence: great response on daily myrbetriq 50 mg and oxybutynin xl 10mg  qd.  No adverse side effects. Recent mild sx's of UTI, cleared with 2d of cipro. UA today->she was unable to produce urine sample today.  2) HTN: The current medical regimen is effective;  continue present plan and medications. Lytes/cr stable 2 mo ago.  3) Grief rxn to husband's death: doing fine, grieving approp, has good family support.  4) Dysuria: possible mild UTI, she self-treated with some cipro she had at home. She could not produce a urine sample here today. Monitor for recurrence of sx's.  INTERIM HX: Still fatigued all the time, poor stamina. Taking oxybutynin prior to bedtijme and it helps well with OAB.  Stopped myrbetriq b/c she eventually felt like it "made things worse". No sx's of infection lately. DM: fasting 125-140 avg, no checks later in the day.  No low glucs.  HTN: rare home bp check, usually normal, sometimes low-normal. Drinking only 40-45 oz fluids per day.   Hypoth: Takes T4 on empty stomach w/out any other meds.  ROS: no fevers, no CP, no SOB, no wheezing, no cough, no dizziness, no HAs, no rashes, no melena/hematochezia.  No polyuria or polydipsia.  No myalgias or arthralgias.  No focal weakness, paresthesias, or tremors.  No acute vision or hearing abnormalities. No n/v/d or abd pain.  No palpitations.     Past Medical History:   Diagnosis Date  . Abdominal pain, right lower quadrant   . Cataracts, both eyes    soon to have cataract surg as of 03/2015  . Chronic diastolic heart failure ([place]) 2019  . Chronic pain syndrome    low back->opioids managed by Dr. Nelva Bush.  . Diabetes mellitus with complication (HCC)    DPN.  NO diab retinopathy as of 06/2017 eye exam.  . Diabetic foot ulcer ([place]) 11/2018   R 3rd toe  . Diabetic peripheral neuropathy associated with type 2 diabetes mellitus ([place])    uncomplicated diabetic ulcer R 3rd toe-->MRI both feet Neg for osteo 10/2018  . Diverticulosis of colon    noted on colonoscopy 2008  . Dyspnea    a. 09/2011 CTA Chest: No PE, Ca2+ cors;  b. 09/2011 R&L heart Cath: Relatively nl R heart pressures, nl co/ci, nonobs cad, nl EF.  12/2015 stress test normal, echo with grd II DD--likely explains DOE.  Worsening chronic DOE 2019-->cath w/no agiograph signif CAD.  Pulm HTN/DD.  EF 65%.  Cards started lasix.  Marland Kitchen External hemorrhoid   . Fatigue    w/ ? excessive daytime somnolence; ? OSA--eval by Dr. Halford Chessman 06/2016, home sleep study ordered.  . Former smoker    30 pack-yrs, quit 2005  . Heart murmur    hx of  . Hx of adenomatous polyp of colon 08/25/2016   07/2016 no recall  . Hyperlipemia, mixed  trigs remain elevated (200s-300s range), but LDL at goal.  . Hypertension   . Hypertensive heart disease 2019  . Hypothyroidism   . IBS (irritable bowel syndrome)    IBS-D (Dr. Carlean Purl) + ? worsened by GI side effect of metformin.  NO pancreatic insufficiency.  As per 09/2017 GI f/u, plan is to continue dicyclomine + f/u with them on prn basis  . Interstitial cystitis   . Liver function study, abnormal   . Lumbar degenerative disc disease    Lumb spondylosis & facet-mediated pain per Dr. Nelva Bush.  Has gotten repeated LB injections.  Eval by Dr. Tonita Cong 03/2017.  Spinal stenosis without neurogenic claudication.  RF neurotomy 05/2017 and 01/2018->on L and R L3 and L4 medial branch and L5 dorsal ramus  nerves.  . Lumbar degenerative disc disease    Surgery not recommended, Dr. Rolena Infante and Dr. Ronnald Ramp.  Radiofreqency neuronotomy 04/18/19, Dr. Nelva Bush.  . Lumbar scoliosis    lumbosacral spondylosis w/out myelopathy (radiofrequency ablation/lesioning procedure planned for 05/2017).  . Macular degeneration    OS exudative (avastin treatments).  Advanced nonexudative OD, monitor.  AREDS  . Macular hole 07/05/2017   OD; surgically repaired 07/2017.  . Menopausal syndrome    restarted estradiol 2016  . Morton's neuroma   . Obesity, Class II, BMI 35-39.9   . OSA on CPAP 07/2016   HST 07/27/16 >> AHI 6.2, SaO2 low 79%. Did not tolerate CPAP.  Pt to try again as of 12/2017.  Compliant with CPAP as of 02/2018 pulm f/u.  . Osteoarthritis, multiple sites 2019   HIPs->surgery NOT recommended ([facility]).  +bilat feet.  . Other specified gastritis without mention of hemorrhage    hx of antral gastritis on EGD 05/2009  . Parkinson's disease ([place]) 2020   Carb/lev started by Dr. Carles Collet 08/2018  . Peripheral neuropathy    ? DPN?--per EMG 11/2012.  Intolerant of gabapentin, failed pamelor.  Radiofrequency neurotomy 12/11/14 helped LB pain (Dr. Nelva Bush)  . Pneumonia   . RLS (restless legs syndrome)   . Tarsal tunnel syndrome   . Trochanteric bursitis of both hips 2019   Injections: [facility]  . Unspecified venous (peripheral) insufficiency     Past Surgical History:  Procedure Laterality Date  . ABI/Arterial eval  02/25/2019   normal ankle arm index and normal triphasic waveforms bilat->no PAD->reassured by vasc surgeon  . ANKLE SURGERY     bilateral  . CARDIAC CATHETERIZATION  09/2011;11/2017   2013: Nonobstructive CAD. 2019: mild pulmonary hypertension. LV end diastolic pressure is moderately elevated.  EF >65%.  DD/hypertensive heart dz-->BP control and add ASA 81mg  + lasix qd.  . CARDIOVASCULAR STRESS TEST  01/04/2016   Normal stress nuclear study with no ischemia or infarction; EF 82 with normal wall  motion  . CATARACT EXTRACTION    . COLONOSCOPY  2002/2005, 11/14/06, and 05/2009   polyps 2002 but none 2005 or 2008 or 2011.  Repeat 08/17/16 for diarrhea: one diminutive adenoma + small polyps removed, diverticulosis noted--no further colonoscopies needed (Dr. Carlean Purl).  . CT angiogram chest  11/05/2017   NEG for PE.  Marland Kitchen DEXA  10/2016   NORMAL--consider repeat 2 yrs.  . ESOPHAGOGASTRODUODENOSCOPY  05/2009   Moderate antral gastritis  . FOOT SURGERY     R foot bunionectomy and arthroplasty of digits R foot.  Arthroplasty digits 3 and 4 L foot.  Marland Kitchen HAMMER TOE SURGERY     bilateral  . left tarsal tunnel release     sept '11 (Dr Beola Cord)  .  PARTIAL KNEE ARTHROPLASTY Right 11/06/2016   Procedure: UNICOMPARTMENTAL RIGHT KNEE- Medially;  Surgeon: Paralee Cancel, MD;  Location: [facility];  Service: Orthopedics;  Laterality: Right;  90 mins  . PFTs  12/05/2017   no obstruction, mild restriction. No reversibility. Moderate DLCO defect --continue prn albut  . radiofrequency neurotomy  06/18/2015   bilat L3-4 medial branch nerve and bilat L5 dorsal ramus nerve.(Dr. Nelva Bush)  . RIGHT/LEFT HEART CATH AND CORONARY ANGIOGRAPHY N/A 12/07/2017   Procedure: RIGHT/LEFT HEART CATH AND CORONARY ANGIOGRAPHY;  Surgeon: Leonie Man, MD;  Location: [facility];  Service: Cardiovascular;  Laterality: N/A;  . TOE AMPUTATION  08/2019   Right 3rd toe distal phalanx due to non-healing ulcer  . TONSILLECTOMY    . TOTAL ABDOMINAL HYSTERECTOMY W/ BILATERAL SALPINGOOPHORECTOMY    . TRANSTHORACIC ECHOCARDIOGRAM  08/2011; 2017   2013 Grade I DD; 2017 EF 60-65%, normal wall motion, grd II DD.    Outpatient Medications Prior to Visit  Medication Sig Dispense Refill  . ALPRAZolam (XANAX) 0.25 MG tablet Take 1 tablet (0.25 mg total) by mouth at bedtime as needed for anxiety. 30 tablet 1  . aspirin EC 81 MG tablet Take 81 mg by mouth daily.    Marland Kitchen atorvastatin (LIPITOR) 20 MG tablet TAKE ONE TABLET BY MOUTH EVERY DAY 30  tablet 0  . azelastine (ASTELIN) 0.1 % nasal spray 2 sprays each nostril 20-30 min prior to wearing CPAP 30 mL 12  . calcium carbonate (CALCIUM 600) 600 MG TABS tablet Take 600 mg by mouth daily.     . carbidopa-levodopa (SINEMET IR) 25-100 MG tablet TAKE ONE TABLET BY MOUTH THREE TIMES DAILY 270 tablet 1  . celecoxib (CELEBREX) 200 MG capsule TAKE ONE CAPSULE BY MOUTH DAILY 90 capsule 0  . diclofenac sodium (VOLTAREN) 1 % GEL Apply 2 g topically 4 (four) times daily.   1  . dicyclomine (BENTYL) 10 MG capsule Take 1 tab 2-3 times daily for diarrhea/urgency. 90 capsule 6  . DULoxetine (CYMBALTA) 60 MG capsule TAKE ONE CAPSULE BY MOUTH EVERY DAY 90 capsule 0  . ferrous sulfate 325 (65 FE) MG tablet Take 1 tablet (325 mg total) by mouth 2 (two) times daily with a meal. 30 tablet 0  . furosemide (LASIX) 20 MG tablet As directed, one tablet once a week 45 tablet 0  . glucose blood test strip Use to check blood once daily 100 each 11  . glucose blood test strip OneTouch Verio test strips  USE TO CHECK BLOOD ONCE DAILY    . HYDROcodone-acetaminophen (NORCO/VICODIN) 5-325 MG tablet Take 1 tablet by mouth 3 (three) times daily as needed.  0  . Lancets 30G MISC Use to check blood sugar once daily 100 each 11  . levothyroxine (SYNTHROID) 25 MCG tablet Take 1 tablet (25 mcg total) by mouth daily. 30 tablet 2  . loratadine (CLARITIN) 10 MG tablet Take 10 mg by mouth daily as needed for allergies.    Marland Kitchen losartan-hydrochlorothiazide (HYZAAR) 50-12.5 MG tablet TAKE ONE & ONE-HALF TABLETS BY MOUTH EVERY DAY 45 tablet 1  . metoprolol tartrate (LOPRESSOR) 50 MG tablet TAKE ONE TABLET BY MOUTH TWICE DAILY 180 tablet 1  . mirtazapine (REMERON) 15 MG tablet TAKE 1/2 TABLET BY MOUTH AT BEDTIME 45 tablet 1  . Multiple Vitamins-Minerals (MULTIVITAMIN ADULT PO) Take 1 tablet by mouth daily.     . Multiple Vitamins-Minerals (PRESERVISION AREDS 2 PO) Take 1 tablet by mouth daily.     Marland Kitchen MYRBETRIQ  50 MG TB24 tablet Take 1  tablet (50 mg total) by mouth daily. 30 tablet 6  . omeprazole (PRILOSEC) 20 MG capsule Take 1 capsule (20 mg total) by mouth daily. 90 capsule 1  . ondansetron (ZOFRAN) 4 MG tablet Take 1 tablet by mouth as needed.    Marland Kitchen oxybutynin (DITROPAN-XL) 10 MG 24 hr tablet Take 1 tablet (10 mg total) by mouth daily as needed (urine frequency). 30 tablet 6  . oxyCODONE-acetaminophen (PERCOCET/ROXICET) 5-325 MG tablet Take 1 tablet by mouth 4 (four) times daily as needed.    . pioglitazone (ACTOS) 30 MG tablet TAKE ONE TABLET BY MOUTH DAILY 90 tablet 0  . potassium chloride SA (KLOR-CON) 20 MEQ tablet TAKE ONE TABLET BY MOUTH EVERY DAY 90 tablet 1  . pregabalin (LYRICA) 50 MG capsule TAKE ONE CAPSULE BY MOUTH TWICE DAILY 180 capsule 0  . tiZANidine (ZANAFLEX) 4 MG tablet Take 4 mg by mouth every 8 (eight) hours as needed for muscle spasms.   1  . cephALEXin (KEFLEX) 500 MG capsule Take 500 mg by mouth 4 (four) times daily.    . ciprofloxacin (CIPRO) 250 MG tablet Take 1 tablet (250 mg total) by mouth 2 (two) times daily. 6 tablet 0  . estradiol (ESTRACE) 0.5 MG tablet Take 1 tablet (0.5 mg total) by mouth at bedtime. 90 tablet 3   No facility-administered medications prior to visit.    Allergies  Allergen Reactions  . Gabapentin Itching  . Sulfonamide Derivatives Nausea Only    REACTION: Nausea    ROS As per HPI  PE: Blood pressure 129/76, pulse 69, temperature (!) 97.4 F (36.3 C), temperature source Oral, resp. rate 16, weight 204 lb 3.2 oz (92.6 kg), SpO2 98 %. Body mass index is 37.35 kg/m.  Gen: Alert, well appearing.  Patient is oriented to person, place, time, and situation. AFFECT: pleasant, lucid thought and speech. CV: RRR, no m/r/g.   LUNGS: CTA bilat, nonlabored resps, good aeration in all lung fields. EXT: no clubbing or cyanosis.  no edema.    LABS:  Lab Results  Component Value Date   TSH 3.45 02/06/2019   Lab Results  Component Value Date   WBC 8.9 05/09/2018   HGB  11.1 05/09/2018   HCT 32.5 (L) 05/09/2018   MCV 92 05/09/2018   PLT 327 05/09/2018   Lab Results  Component Value Date   IRON 74 11/01/2017   IRON 92 11/01/2017   TIBC 373 11/01/2017   FERRITIN 42.4 11/01/2017   Lab Results  Component Value Date   VITAMINB12 1,316 (H) 07/10/2016    Lab Results  Component Value Date   CREATININE 0.81 07/03/2019   BUN 23 07/03/2019   NA 140 07/03/2019   K 4.0 07/03/2019   CL 100 07/03/2019   CO2 32 07/03/2019   Lab Results  Component Value Date   ALT 21 02/06/2019   AST 17 02/06/2019   ALKPHOS 98 02/06/2019   BILITOT 0.4 02/06/2019   Lab Results  Component Value Date   CHOL 161 02/06/2019   Lab Results  Component Value Date   HDL 44.70 02/06/2019   Lab Results  Component Value Date   LDLCALC 52 05/09/2018   Lab Results  Component Value Date   TRIG 307.0 (H) 02/06/2019   Lab Results  Component Value Date   CHOLHDL 4 02/06/2019   Lab Results  Component Value Date   HGBA1C 6.4 07/03/2019   IMPRESSION AND PLAN:  1) DM 2 with  DPN:  stable per fasting home glucoses. HbA1c today.  Lytes/cr today. No med changes at this time.  2) HTN: The current medical regimen is effective;  continue present plan and medications. Lytes/cr today. Urine microalb/cr at next f/u visit in 3 mo.  3) HLD tolerating statin. 54 Feb 2020, hepatic panel normal at that time. Not fasting today. FLP and hepatic panel at next f/u in 3 mo.  4) Hypothyroidism: TSH normal Nov 2020. Plan recheck TSH next f/u in 3 mo.  5) Urge incontinence: stable on oxybutynin. Off myrbetriq b/c she thought it made prob worse. No sign of any infection recently.  An After Visit Summary was printed and given to the patient.  FOLLOW UP: Return in about 3 months (around 02/24/2020) for routine chronic illness f/u--fasting.  Signed:  Crissie Sickles, MD           11/25/2019

## 2019-11-26 ENCOUNTER — Other Ambulatory Visit: Payer: Self-pay | Admitting: Family Medicine

## 2019-12-01 ENCOUNTER — Encounter: Payer: Self-pay | Admitting: Family Medicine

## 2019-12-02 ENCOUNTER — Other Ambulatory Visit: Payer: Self-pay | Admitting: Family Medicine

## 2019-12-02 NOTE — Telephone Encounter (Signed)
OK to take TWO otc aleve tabs every 12 hours. However, you must stop taking celecoxib if you take aleve.

## 2019-12-08 ENCOUNTER — Telehealth: Payer: Self-pay | Admitting: Internal Medicine

## 2019-12-08 NOTE — Telephone Encounter (Signed)
Please add her onto my next clinic day

## 2019-12-08 NOTE — Telephone Encounter (Signed)
Patient sent a message in Patient Schedule requesting an appointment stating "I have been having shortnes of breath.  My husband passed away about 4 months ago and I have been doing ok living alone, but I find thtat when I go to do some thing I get shortness of breath, I don't kknow if its a pbroblem or just that I don't slow down enough." Please advise.

## 2019-12-10 NOTE — Telephone Encounter (Signed)
Called and scheduled patient tomorrow w Dr. Harrington Challenger.

## 2019-12-11 ENCOUNTER — Other Ambulatory Visit: Payer: Self-pay

## 2019-12-11 ENCOUNTER — Ambulatory Visit (INDEPENDENT_AMBULATORY_CARE_PROVIDER_SITE_OTHER): Payer: Medicare Other | Admitting: Internal Medicine

## 2019-12-11 ENCOUNTER — Encounter: Payer: Self-pay | Admitting: Internal Medicine

## 2019-12-11 VITALS — BP 138/52 | HR 74 | Ht 62.0 in | Wt 204.0 lb

## 2019-12-11 DIAGNOSIS — I5032 Chronic diastolic (congestive) heart failure: Secondary | ICD-10-CM | POA: Diagnosis not present

## 2019-12-11 MED ORDER — FUROSEMIDE 40 MG PO TABS: 40.0000 mg | ORAL_TABLET | ORAL | 3 refills | Status: AC

## 2019-12-11 NOTE — Progress Notes (Signed)
Cardiology Office Note   Date:  12/11/2019   ID:  Katherine Best, DOB 1934/02/07, MRN [id number]  PCP:  Tammi Sou, MD  Cardiologist:   Dorris Carnes, MD    Pt presents for f/u of CAD and SOB     History of Present Illness: Katherine Best is a 86 y.o. female with a history of mld CAD at [place] Sept 2019 (20%prox RCA; 35% prox/midLAD; 45% mLC) and elevated R heart pressures PCWP 18 to 24   PA 37/13  RA 12 LVEDP 24  Started on Lasix pt has been busy caring for husband who has dementia and cancer  This fatigues her    I saw the pt in 07/06/19 Since then her husband died in 2022/09/05   Still grieving   Pt says she still gets SOB when pushes  Does OK in house  She denies CP    Current Meds  Medication Sig  . ALPRAZolam (XANAX) 0.25 MG tablet Take 1 tablet (0.25 mg total) by mouth at bedtime as needed for anxiety.  Marland Kitchen aspirin EC 81 MG tablet Take 81 mg by mouth daily.  Marland Kitchen atorvastatin (LIPITOR) 20 MG tablet TAKE ONE TABLET BY MOUTH EVERY DAY  . azelastine (ASTELIN) 0.1 % nasal spray 2 sprays each nostril 20-30 min prior to wearing CPAP  . calcium carbonate (CALCIUM 600) 600 MG TABS tablet Take 600 mg by mouth daily.   . carbidopa-levodopa (SINEMET IR) 25-100 MG tablet TAKE ONE TABLET BY MOUTH THREE TIMES DAILY  . celecoxib (CELEBREX) 200 MG capsule TAKE ONE CAPSULE BY MOUTH DAILY  . diclofenac sodium (VOLTAREN) 1 % GEL Apply 2 g topically 4 (four) times daily.   Marland Kitchen dicyclomine (BENTYL) 10 MG capsule Take 1 tab 2-3 times daily for diarrhea/urgency.  . DULoxetine (CYMBALTA) 60 MG capsule TAKE ONE CAPSULE BY MOUTH EVERY DAY  . ferrous sulfate 325 (65 FE) MG tablet Take 1 tablet (325 mg total) by mouth 2 (two) times daily with a meal.  . furosemide (LASIX) 20 MG tablet As directed, one tablet once a week  . glucose blood test strip Use to check blood once daily  . glucose blood test strip OneTouch Verio test strips  USE TO CHECK BLOOD ONCE DAILY  . HYDROcodone-acetaminophen  (NORCO/VICODIN) 5-325 MG tablet Take 1 tablet by mouth 3 (three) times daily as needed.  . Lancets 30G MISC Use to check blood sugar once daily  . levothyroxine (SYNTHROID) 25 MCG tablet Take 1 tablet (25 mcg total) by mouth daily.  Marland Kitchen loratadine (CLARITIN) 10 MG tablet Take 10 mg by mouth daily as needed for allergies.  Marland Kitchen losartan-hydrochlorothiazide (HYZAAR) 50-12.5 MG tablet TAKE ONE & ONE-HALF TABLETS BY MOUTH EVERY DAY  . metoprolol tartrate (LOPRESSOR) 50 MG tablet TAKE ONE TABLET BY MOUTH TWICE DAILY  . mirtazapine (REMERON) 15 MG tablet TAKE 1/2 TABLET BY MOUTH AT BEDTIME  . Multiple Vitamins-Minerals (MULTIVITAMIN ADULT PO) Take 1 tablet by mouth daily.   . Multiple Vitamins-Minerals (PRESERVISION AREDS 2 PO) Take 1 tablet by mouth daily.   Marland Kitchen omeprazole (PRILOSEC) 20 MG capsule Take 1 capsule (20 mg total) by mouth daily.  . ondansetron (ZOFRAN) 4 MG tablet Take 1 tablet by mouth as needed.  Marland Kitchen oxybutynin (DITROPAN-XL) 10 MG 24 hr tablet Take 1 tablet (10 mg total) by mouth daily as needed (urine frequency).  Marland Kitchen oxyCODONE-acetaminophen (PERCOCET/ROXICET) 5-325 MG tablet Take 1 tablet by mouth 4 (four) times daily as needed.  . pioglitazone (  ACTOS) 30 MG tablet TAKE ONE TABLET BY MOUTH DAILY  . potassium chloride SA (KLOR-CON) 20 MEQ tablet TAKE ONE TABLET BY MOUTH EVERY DAY  . pregabalin (LYRICA) 50 MG capsule TAKE ONE CAPSULE BY MOUTH TWICE DAILY  . tiZANidine (ZANAFLEX) 4 MG tablet Take 4 mg by mouth every 8 (eight) hours as needed for muscle spasms.      Allergies:   Gabapentin and Sulfonamide derivatives   Past Medical History:  Diagnosis Date  . Abdominal pain, right lower quadrant   . Cataracts, both eyes    soon to have cataract surg as of 03/2015  . Chronic diastolic heart failure ([place]) 2019  . Chronic pain syndrome    low back->opioids managed by Dr. Nelva Bush.  . Diabetes mellitus with complication (HCC)    DPN.  NO diab retinopathy as of 06/2017 eye exam.  . Diabetic foot  ulcer ([place]) 11/2018   R 3rd toe  . Diabetic peripheral neuropathy associated with type 2 diabetes mellitus ([place])    uncomplicated diabetic ulcer R 3rd toe-->MRI both feet Neg for osteo 10/2018  . Diverticulosis of colon    noted on colonoscopy 2008  . Dyspnea    a. 09/2011 CTA Chest: No PE, Ca2+ cors;  b. 09/2011 R&L heart Cath: Relatively nl R heart pressures, nl co/ci, nonobs cad, nl EF.  12/2015 stress test normal, echo with grd II DD--likely explains DOE.  Worsening chronic DOE 2019-->cath w/no agiograph signif CAD.  Pulm HTN/DD.  EF 65%.  Cards started lasix.  Marland Kitchen External hemorrhoid   . Fatigue    w/ ? excessive daytime somnolence; ? OSA--eval by Dr. Halford Chessman 06/2016, home sleep study ordered.  . Former smoker    30 pack-yrs, quit 2005  . Heart murmur    hx of  . Hx of adenomatous polyp of colon 08/25/2016   07/2016 no recall  . Hyperlipemia, mixed    trigs remain elevated (200s-300s range), but LDL at goal.  . Hypertension   . Hypertensive heart disease 2019  . Hypothyroidism   . IBS (irritable bowel syndrome)    IBS-D (Dr. Carlean Purl) + ? worsened by GI side effect of metformin.  NO pancreatic insufficiency.  As per 09/2017 GI f/u, plan is to continue dicyclomine + f/u with them on prn basis  . Interstitial cystitis   . Liver function study, abnormal   . Lumbar degenerative disc disease    Lumb spondylosis & facet-mediated pain per Dr. Nelva Bush.  Has gotten repeated LB injections.  Eval by Dr. Tonita Cong 03/2017.  Spinal stenosis without neurogenic claudication.  RF neurotomy 05/2017 and 01/2018->on L and R L3 and L4 medial branch and L5 dorsal ramus nerves.  . Lumbar degenerative disc disease    Surgery not recommended, Dr. Rolena Infante and Dr. Ronnald Ramp.  Radiofreqency neuronotomy 04/18/19, Dr. Nelva Bush.  . Lumbar scoliosis    lumbosacral spondylosis w/out myelopathy (radiofrequency ablation/lesioning procedure planned for 05/2017).  . Macular degeneration    OS exudative (avastin treatments).  Advanced  nonexudative OD, monitor.  AREDS  . Macular hole 07/05/2017   OD; surgically repaired 07/2017.  . Menopausal syndrome    restarted estradiol 2016  . Morton's neuroma   . Obesity, Class II, BMI 35-39.9   . OSA on CPAP 07/2016   HST 07/27/16 >> AHI 6.2, SaO2 low 79%. Did not tolerate CPAP.  Pt to try again as of 12/2017.  Compliant with CPAP as of 02/2018 pulm f/u.  . Osteoarthritis, multiple sites 2019   HIPs->surgery NOT  recommended ([facility]).  +bilat feet.  . Other specified gastritis without mention of hemorrhage    hx of antral gastritis on EGD 05/2009  . Parkinson's disease ([facility]) 2020   Carb/lev started by Dr. Carles Collet 08/2018  . Peripheral neuropathy    ? DPN?--per EMG 11/2012.  Intolerant of gabapentin, failed pamelor.  Radiofrequency neurotomy 12/11/14 helped LB pain (Dr. Nelva Bush)  . Pneumonia   . RLS (restless legs syndrome)   . Tarsal tunnel syndrome   . Trochanteric bursitis of both hips 2019   Injections: [facility]  . Unspecified venous (peripheral) insufficiency     Past Surgical History:  Procedure Laterality Date  . ABI/Arterial eval  02/25/2019   normal ankle arm index and normal triphasic waveforms bilat->no PAD->reassured by vasc surgeon  . ANKLE SURGERY     bilateral  . CARDIAC CATHETERIZATION  09/2011;11/2017   2013: Nonobstructive CAD. 2019: mild pulmonary hypertension. LV end diastolic pressure is moderately elevated.  EF >65%.  DD/hypertensive heart dz-->BP control and add ASA 81mg  + lasix qd.  . CARDIOVASCULAR STRESS TEST  01/04/2016   Normal stress nuclear study with no ischemia or infarction; EF 82 with normal wall motion  . CATARACT EXTRACTION    . COLONOSCOPY  2002/2005, 11/14/06, and 05/2009   polyps 2002 but none 2005 or 2008 or 2011.  Repeat 08/17/16 for diarrhea: one diminutive adenoma + small polyps removed, diverticulosis noted--no further colonoscopies needed (Dr. Carlean Purl).  . CT angiogram chest  11/05/2017   NEG for PE.  Marland Kitchen DEXA  10/2016    NORMAL--consider repeat 2 yrs.  . ESOPHAGOGASTRODUODENOSCOPY  05/2009   Moderate antral gastritis  . FOOT SURGERY     R foot bunionectomy and arthroplasty of digits R foot.  Arthroplasty digits 3 and 4 L foot.  Marland Kitchen HAMMER TOE SURGERY     bilateral  . left tarsal tunnel release     sept '11 (Dr Beola Cord)  . PARTIAL KNEE ARTHROPLASTY Right 11/06/2016   Procedure: UNICOMPARTMENTAL RIGHT KNEE- Medially;  Surgeon: Paralee Cancel, MD;  Location: [facility];  Service: Orthopedics;  Laterality: Right;  90 mins  . PFTs  12/05/2017   no obstruction, mild restriction. No reversibility. Moderate DLCO defect --continue prn albut  . radiofrequency neurotomy  06/18/2015   bilat L3-4 medial branch nerve and bilat L5 dorsal ramus nerve.(Dr. Nelva Bush)  . RIGHT/LEFT HEART CATH AND CORONARY ANGIOGRAPHY N/A 12/07/2017   Procedure: RIGHT/LEFT HEART CATH AND CORONARY ANGIOGRAPHY;  Surgeon: Leonie Man, MD;  Location: [facility];  Service: Cardiovascular;  Laterality: N/A;  . TOE AMPUTATION  08/2019   Right 3rd toe distal phalanx due to non-healing ulcer  . TONSILLECTOMY    . TOTAL ABDOMINAL HYSTERECTOMY W/ BILATERAL SALPINGOOPHORECTOMY    . TRANSTHORACIC ECHOCARDIOGRAM  08/2011; 2017   2013 Grade I DD; 2017 EF 60-65%, normal wall motion, grd II DD.     Social History:  The patient  reports that she quit smoking about 17 years ago. Her smoking use included cigarettes. She has a 35.00 pack-year smoking history. She has never used smokeless tobacco. She reports that she does not drink alcohol and does not use drugs.   Family History:  The patient's family history includes Breast cancer in her mother; Cancer in her mother; Diabetes in her maternal grandfather; Hyperlipidemia in her father; Hypertension in her father; Mental illness in her brother.    ROS:  Please see the history of present illness. All other systems are reviewed and  Negative to the above  problem except as noted.    PHYSICAL EXAM: VS:  BP (!)  138/52   Pulse 74   Ht 5\' 2"  (1.575 m)   Wt 204 lb (92.5 kg)   SpO2 98%   BMI 37.31 kg/m   GEN: Morbidly obese 86 yo , in no acute distress  HEENT: normal  Neck: JVP is normal    Cardiac: RRR; no murmurs,  No LE edema   Respiratory: Relatively clear No wheezes  No rales  GI: soft, nontender, nondistended, + BS  No hepatomegaly  MS: no deformi ty Moving all extremities   Skin: warm and dry,Some erythema to calvs   Neuro:  Strength and sensation are intact Psych: euthymic mood, full affect   EKG:  EKG is ordered today. Ectopic atrial rhythm  74 bpm  First degree AV block  Pr 238     Lipid Panel    Component Value Date/Time   CHOL 161 02/06/2019 0850   CHOL 152 05/09/2018 1550   TRIG 307.0 (H) 02/06/2019 0850   HDL 44.70 02/06/2019 0850   HDL 46 05/09/2018 1550   CHOLHDL 4 02/06/2019 0850   VLDL 61.4 (H) 02/06/2019 0850   LDLCALC 52 05/09/2018 1550   LDLCALC 68 04/17/2017 0803   LDLDIRECT 73.0 02/06/2019 0850      Wt Readings from Last 3 Encounters:  12/11/19 204 lb (92.5 kg)  11/25/19 204 lb 3.2 oz (92.6 kg)  10/21/19 202 lb 2 oz (91.7 kg)      ASSESSMENT AND PLAN:  1 Acute on chronic diastolic CHF  I have asked her to try 40 mg Lasix 1x per week  Try for a few wks   If no change in breathing try 2x per week   Keep on KCL  I have asked her to all/email response in about 1 month   2   CAD   Mild  Continue with risk factor modifications     (HTN, HL)  No anginal symtpoms   I do not think dyspnea is anginal equivalent    2  HL Continue statin and follow lipids   Last LDL 73    4    HTN   BP is good    5   Morbid obesity  Watch intake  Discussed elim breakfast  CUt back on carbs     F/U in next winter    Signed, Dorris Carnes, MD  12/11/2019 10:56 AM    [address], [place], [place]  [address] Phone: [phone]; Fax: [phone]

## 2019-12-11 NOTE — Patient Instructions (Addendum)
Medication Instructions:  Your physician has recommended you make the following change in your medication:  1.) once a week--take furosemide (Lasix) 40 mg, after 3-4 weeks, if you don't notice a difference in your breathing, increase to twice a week. *If you need a refill on your cardiac medications before your next appointment, please call your pharmacy*   Lab Work: none If you have labs (blood work) drawn today and your tests are completely normal, you will receive your results only by: Marland Kitchen MyChart Message (if you have MyChart) OR . A paper copy in the mail If you have any lab test that is abnormal or we need to change your treatment, we will call you to review the results.   Testing/Procedures: none   Follow-Up: With Dr. Harrington Challenger as planned in February  Other Instructions

## 2019-12-15 ENCOUNTER — Other Ambulatory Visit: Payer: Self-pay | Admitting: Family Medicine

## 2019-12-15 DIAGNOSIS — I10 Essential (primary) hypertension: Secondary | ICD-10-CM

## 2019-12-17 ENCOUNTER — Other Ambulatory Visit: Payer: Self-pay | Admitting: Family Medicine

## 2019-12-22 DIAGNOSIS — H353222 Exudative age-related macular degeneration, left eye, with inactive choroidal neovascularization: Secondary | ICD-10-CM | POA: Diagnosis not present

## 2019-12-24 ENCOUNTER — Telehealth: Payer: Self-pay

## 2019-12-24 NOTE — Progress Notes (Signed)
Virtual Visit Via Video turned telephone  Multiple attempts at video are unsuccessful The purpose of this virtual visit is to provide medical care while limiting exposure to the novel coronavirus.    Consent was obtained for videoturned telephone visit:  Yes.   Answered questions that patient had about telehealth interaction:  Yes.   I discussed the limitations, risks, security and privacy concerns of performing an evaluation and management service by telemedicine. I also discussed with the patient that there may be a patient responsible charge related to this service. The patient expressed understanding and agreed to proceed.  Pt location: Home Physician Location: office Name of referring provider:  Tammi Sou, MD I connected with Katherine Best at patients initiation/request on 12/25/2019 at 11:15 AM EDT by telephone and verified that I am speaking with the correct person using two identifiers. Pt MRN:  [id number] Pt DOB:  03-06-1934 Video Participants:  Katherine Best;    Assessment/Plan:   1.  Parkinson's disease  -Continue carbidopa/levodopa 25/100, 1 tablet 3 times per day.  She and I discussed alarm for remembering the middle of the day dosage of levodopa (been a chronic issue)  -exercise limited by back pain 2.  Chronic low back pain  -Follows with Dr. Herma Mering 3.  Adjustment disorder/grief  -Patient's husband died in Sep 06, 2022. 4.  Would like to see patient in person but she states that she has trouble walking in our office and since husband died, no one to help her.  Wants to keep VV (hopefully someone can help her - she is going to ask son)  Subjective   Patient seen today in follow-up for Parkinson's disease.  Last visit, we talked about the importance of compliance with 3 times daily dosing with levodopa.  She reports that she still has trouble remembering that one. pt denies falls.  Pt denies lightheadedness, near syncope.  No hallucinations.  Mood has been fair.  Her husband  did die since our last visit.  She had been caregiving for him for for quite a long time.  He had dementia.  Pt thinks that she is doing better.  Her kids want her to move to independent living but pt doesn't want to do that.  She feels that she is doing well. Patient saw cardiology on September 16.  She had acute on chronic congestive heart failure.  She was given 40 mg Lasix 1 time per week.    Current movement d/o meds: Carbidopa/levodopa 25/100, 1 tablet 3 times per day (has trouble with middle of the day dosage)    Current Outpatient Medications on File Prior to Visit  Medication Sig Dispense Refill  . ALPRAZolam (XANAX) 0.25 MG tablet Take 1 tablet (0.25 mg total) by mouth at bedtime as needed for anxiety. 30 tablet 1  . aspirin EC 81 MG tablet Take 81 mg by mouth daily.    Marland Kitchen atorvastatin (LIPITOR) 20 MG tablet TAKE ONE TABLET BY MOUTH EVERY DAY 30 tablet 0  . azelastine (ASTELIN) 0.1 % nasal spray 2 sprays each nostril 20-30 min prior to wearing CPAP 30 mL 12  . calcium carbonate (CALCIUM 600) 600 MG TABS tablet Take 600 mg by mouth daily.     . carbidopa-levodopa (SINEMET IR) 25-100 MG tablet TAKE ONE TABLET BY MOUTH THREE TIMES DAILY 270 tablet 1  . celecoxib (CELEBREX) 200 MG capsule TAKE ONE CAPSULE BY MOUTH DAILY 90 capsule 0  . diclofenac sodium (VOLTAREN) 1 % GEL Apply 2 g topically  4 (four) times daily.   1  . dicyclomine (BENTYL) 10 MG capsule Take 1 tab 2-3 times daily for diarrhea/urgency. 90 capsule 6  . DULoxetine (CYMBALTA) 60 MG capsule TAKE ONE CAPSULE BY MOUTH EVERY DAY 90 capsule 0  . ferrous sulfate 325 (65 FE) MG tablet Take 1 tablet (325 mg total) by mouth 2 (two) times daily with a meal. 30 tablet 0  . furosemide (LASIX) 40 MG tablet Take 1 tablet (40 mg total) by mouth once a week. 30 tablet 3  . glucose blood test strip Use to check blood once daily 100 each 11  . glucose blood test strip OneTouch Verio test strips  USE TO CHECK BLOOD ONCE DAILY    . Lancets 30G  MISC Use to check blood sugar once daily 100 each 11  . levothyroxine (SYNTHROID) 25 MCG tablet TAKE ONE TABLET BY MOUTH EVERY DAY 30 tablet 2  . loratadine (CLARITIN) 10 MG tablet Take 10 mg by mouth daily as needed for allergies.    Marland Kitchen losartan-hydrochlorothiazide (HYZAAR) 50-12.5 MG tablet TAKE ONE & ONE-HALF TABLETS BY MOUTH EVERY DAY 45 tablet 1  . metoprolol tartrate (LOPRESSOR) 50 MG tablet TAKE ONE TABLET BY MOUTH TWICE DAILY 180 tablet 1  . mirtazapine (REMERON) 15 MG tablet TAKE 1/2 TABLET BY MOUTH AT BEDTIME 45 tablet 1  . Multiple Vitamins-Minerals (MULTIVITAMIN ADULT PO) Take 1 tablet by mouth daily.     . Multiple Vitamins-Minerals (PRESERVISION AREDS 2 PO) Take 1 tablet by mouth daily.     Marland Kitchen omeprazole (PRILOSEC) 20 MG capsule Take 1 capsule (20 mg total) by mouth daily. 90 capsule 1  . ondansetron (ZOFRAN) 4 MG tablet Take 1 tablet by mouth as needed.    Marland Kitchen oxybutynin (DITROPAN-XL) 10 MG 24 hr tablet Take 1 tablet (10 mg total) by mouth daily as needed (urine frequency). 30 tablet 6  . pioglitazone (ACTOS) 30 MG tablet TAKE ONE TABLET BY MOUTH DAILY 90 tablet 0  . potassium chloride SA (KLOR-CON) 20 MEQ tablet TAKE ONE TABLET BY MOUTH EVERY DAY 90 tablet 1  . pregabalin (LYRICA) 50 MG capsule TAKE ONE CAPSULE BY MOUTH TWICE DAILY 180 capsule 0  . tiZANidine (ZANAFLEX) 4 MG tablet Take 4 mg by mouth every 8 (eight) hours as needed for muscle spasms.   1  . HYDROcodone-acetaminophen (NORCO/VICODIN) 5-325 MG tablet Take 1 tablet by mouth 3 (three) times daily as needed. (Patient not taking: Reported on 12/25/2019)  0  . oxyCODONE-acetaminophen (PERCOCET/ROXICET) 5-325 MG tablet Take 1 tablet by mouth 4 (four) times daily as needed. (Patient not taking: Reported on 12/25/2019)     No current facility-administered medications on file prior to visit.     Objective   Vitals:   12/25/19 1032  Weight: 200 lb (90.7 kg)  Height: 5\' 2"  (1.575 m)   Pt is alert and oriented x  3    Follow up Instructions      -I discussed the assessment and treatment plan with the patient. The patient was provided an opportunity to ask questions and all were answered. The patient agreed with the plan and demonstrated an understanding of the instructions.   The patient was advised to call back or seek an in-person evaluation if the symptoms worsen or if the condition fails to improve as anticipated.    Total time spent on today's visit was 12 minutes, including both face-to-face time and nonface-to-face time.  Time included that spent on review of records (prior notes  available to me/labs/imaging if pertinent), discussing treatment and goals, answering patient's questions and coordinating care.   Alonza Bogus, DO

## 2019-12-24 NOTE — Telephone Encounter (Signed)
Patient advised to wait at least 2 weeks before getting flu shot from covid booster. Pt would like to get flu shot out of the way before booster. Appt scheduled to obtain flu shot.

## 2019-12-24 NOTE — Telephone Encounter (Signed)
Pt called states scheduled covid booster 01/13/20. Pt wants to know if there is a suggested amount of time to waif between getting covid vaccine and flu vaccine.  Pt [phone].

## 2019-12-25 ENCOUNTER — Telehealth (INDEPENDENT_AMBULATORY_CARE_PROVIDER_SITE_OTHER): Payer: Medicare Other | Admitting: Neurology

## 2019-12-25 ENCOUNTER — Other Ambulatory Visit: Payer: Self-pay

## 2019-12-25 VITALS — Ht 62.0 in | Wt 200.0 lb

## 2019-12-25 DIAGNOSIS — G20 Parkinson's disease: Secondary | ICD-10-CM | POA: Diagnosis not present

## 2019-12-26 ENCOUNTER — Ambulatory Visit (INDEPENDENT_AMBULATORY_CARE_PROVIDER_SITE_OTHER): Payer: Medicare Other

## 2019-12-26 ENCOUNTER — Ambulatory Visit: Payer: Medicare Other

## 2019-12-26 DIAGNOSIS — Z23 Encounter for immunization: Secondary | ICD-10-CM

## 2019-12-29 ENCOUNTER — Other Ambulatory Visit: Payer: Self-pay | Admitting: Family Medicine

## 2019-12-30 ENCOUNTER — Telehealth: Payer: Self-pay

## 2019-12-30 MED ORDER — FLUCONAZOLE 150 MG PO TABS: 150.0000 mg | ORAL_TABLET | Freq: Once | ORAL | 0 refills | Status: AC

## 2019-12-30 NOTE — Telephone Encounter (Signed)
Please advise, thanks.

## 2019-12-30 NOTE — Telephone Encounter (Signed)
Patient thinks she may have a yeast infection.  States that she her vaginal area is very irritated and burning.  Can we call yeast infection meds in for patient?  Patient can be reached at [phone].

## 2019-12-30 NOTE — Telephone Encounter (Signed)
Patient made aware refill sent and to follow up with PCP if no improvement.

## 2019-12-30 NOTE — Telephone Encounter (Signed)
I'll eRx diflucan 150mg  x 1 dose. If not better in 2-3 d then needs in person o/v.

## 2020-01-04 ENCOUNTER — Encounter: Payer: Self-pay | Admitting: Family Medicine

## 2020-01-12 ENCOUNTER — Other Ambulatory Visit: Payer: Self-pay | Admitting: Neurology

## 2020-01-12 ENCOUNTER — Other Ambulatory Visit: Payer: Self-pay | Admitting: Family Medicine

## 2020-01-12 NOTE — Telephone Encounter (Signed)
Rx(s) sent to pharmacy electronically.  

## 2020-01-13 ENCOUNTER — Telehealth: Payer: Medicare Other | Admitting: Neurology

## 2020-01-13 ENCOUNTER — Other Ambulatory Visit (HOSPITAL_BASED_OUTPATIENT_CLINIC_OR_DEPARTMENT_OTHER): Payer: Self-pay | Admitting: Internal Medicine

## 2020-01-13 ENCOUNTER — Other Ambulatory Visit: Payer: Self-pay | Admitting: Family Medicine

## 2020-01-13 ENCOUNTER — Ambulatory Visit: Payer: Medicare Other | Attending: Internal Medicine

## 2020-01-13 DIAGNOSIS — Z1231 Encounter for screening mammogram for malignant neoplasm of breast: Secondary | ICD-10-CM

## 2020-01-13 DIAGNOSIS — Z23 Encounter for immunization: Secondary | ICD-10-CM

## 2020-01-22 DIAGNOSIS — E1351 Other specified diabetes mellitus with diabetic peripheral angiopathy without gangrene: Secondary | ICD-10-CM | POA: Diagnosis not present

## 2020-01-22 DIAGNOSIS — L602 Onychogryphosis: Secondary | ICD-10-CM | POA: Diagnosis not present

## 2020-01-22 DIAGNOSIS — L84 Corns and callosities: Secondary | ICD-10-CM | POA: Diagnosis not present

## 2020-01-26 ENCOUNTER — Other Ambulatory Visit: Payer: Self-pay | Admitting: Family Medicine

## 2020-01-26 DIAGNOSIS — I10 Essential (primary) hypertension: Secondary | ICD-10-CM

## 2020-02-09 ENCOUNTER — Other Ambulatory Visit: Payer: Self-pay | Admitting: Family Medicine

## 2020-02-10 DIAGNOSIS — M5416 Radiculopathy, lumbar region: Secondary | ICD-10-CM | POA: Diagnosis not present

## 2020-02-23 ENCOUNTER — Other Ambulatory Visit: Payer: Self-pay | Admitting: Family Medicine

## 2020-02-23 DIAGNOSIS — I10 Essential (primary) hypertension: Secondary | ICD-10-CM

## 2020-02-25 ENCOUNTER — Other Ambulatory Visit: Payer: Self-pay

## 2020-02-25 ENCOUNTER — Ambulatory Visit (INDEPENDENT_AMBULATORY_CARE_PROVIDER_SITE_OTHER): Payer: Medicare Other | Admitting: Family Medicine

## 2020-02-25 ENCOUNTER — Encounter: Payer: Self-pay | Admitting: Family Medicine

## 2020-02-25 VITALS — BP 117/67 | HR 61 | Temp 97.5°F | Resp 16 | Ht 62.0 in | Wt 205.6 lb

## 2020-02-25 DIAGNOSIS — E039 Hypothyroidism, unspecified: Secondary | ICD-10-CM

## 2020-02-25 DIAGNOSIS — Z79899 Other long term (current) drug therapy: Secondary | ICD-10-CM

## 2020-02-25 DIAGNOSIS — E7800 Pure hypercholesterolemia, unspecified: Secondary | ICD-10-CM | POA: Diagnosis not present

## 2020-02-25 DIAGNOSIS — I10 Essential (primary) hypertension: Secondary | ICD-10-CM | POA: Diagnosis not present

## 2020-02-25 DIAGNOSIS — F411 Generalized anxiety disorder: Secondary | ICD-10-CM | POA: Diagnosis not present

## 2020-02-25 DIAGNOSIS — E1142 Type 2 diabetes mellitus with diabetic polyneuropathy: Secondary | ICD-10-CM | POA: Diagnosis not present

## 2020-02-25 LAB — CBC WITH DIFFERENTIAL/PLATELET
Basophils Absolute: 0 10*3/uL (ref 0.0–0.1)
Basophils Relative: 0.6 % (ref 0.0–3.0)
Eosinophils Absolute: 0.2 10*3/uL (ref 0.0–0.7)
Eosinophils Relative: 3.6 % (ref 0.0–5.0)
HCT: 35.8 % — ABNORMAL LOW (ref 36.0–46.0)
Hemoglobin: 11.9 g/dL — ABNORMAL LOW (ref 12.0–15.0)
Lymphocytes Relative: 21.5 % (ref 12.0–46.0)
Lymphs Abs: 1.3 10*3/uL (ref 0.7–4.0)
MCHC: 33.4 g/dL (ref 30.0–36.0)
MCV: 93.9 fl (ref 78.0–100.0)
Monocytes Absolute: 0.5 10*3/uL (ref 0.1–1.0)
Monocytes Relative: 9.1 % (ref 3.0–12.0)
Neutro Abs: 3.9 10*3/uL (ref 1.4–7.7)
Neutrophils Relative %: 65.2 % (ref 43.0–77.0)
Platelets: 278 10*3/uL (ref 150.0–400.0)
RBC: 3.81 Mil/uL — ABNORMAL LOW (ref 3.87–5.11)
RDW: 14 % (ref 11.5–15.5)
WBC: 6 10*3/uL (ref 4.0–10.5)

## 2020-02-25 LAB — COMPREHENSIVE METABOLIC PANEL
ALT: 17 U/L (ref 0–35)
AST: 16 U/L (ref 0–37)
Albumin: 4.1 g/dL (ref 3.5–5.2)
Alkaline Phosphatase: 99 U/L (ref 39–117)
BUN: 25 mg/dL — ABNORMAL HIGH (ref 6–23)
CO2: 31 mEq/L (ref 19–32)
Calcium: 9.5 mg/dL (ref 8.4–10.5)
Chloride: 100 mEq/L (ref 96–112)
Creatinine, Ser: 0.88 mg/dL (ref 0.40–1.20)
GFR: 60.52 mL/min (ref >60.00)
Glucose, Bld: 109 mg/dL — ABNORMAL HIGH (ref 70–99)
Potassium: 3.8 mEq/L (ref 3.5–5.1)
Sodium: 140 mEq/L (ref 135–145)
Total Bilirubin: 0.4 mg/dL (ref 0.2–1.2)
Total Protein: 6.5 g/dL (ref 6.0–8.3)

## 2020-02-25 LAB — LIPID PANEL
Cholesterol: 148 mg/dL (ref 0–200)
HDL: 45.4 mg/dL (ref >39.00)
NonHDL: 102.28
Total CHOL/HDL Ratio: 3
Triglycerides: 299 mg/dL — ABNORMAL HIGH (ref 0.0–149.0)
VLDL: 59.8 mg/dL — ABNORMAL HIGH (ref 0.0–40.0)

## 2020-02-25 LAB — TSH: TSH: 2.28 u[IU]/mL (ref 0.35–4.50)

## 2020-02-25 LAB — MICROALBUMIN / CREATININE URINE RATIO
Creatinine,U: 98.9 mg/dL
Microalb Creat Ratio: 0.7 mg/g (ref 0.0–30.0)
Microalb, Ur: 0.7 mg/dL (ref 0.0–1.9)

## 2020-02-25 LAB — LDL CHOLESTEROL, DIRECT: Direct LDL: 58 mg/dL

## 2020-02-25 LAB — HEMOGLOBIN A1C: Hgb A1c MFr Bld: 6.4 % (ref 4.6–6.5)

## 2020-02-25 NOTE — Progress Notes (Signed)
OFFICE VISIT  02/25/2020  CC:  Chief Complaint  Patient presents with  . Follow-up    RCI, pt is fasting    HPI:    Patient is a 86 y.o. Caucasian female who presents for 3 month f/u DM 2, HLD, HTN, hypothyroidism, and anxiety/dep. She has chronic fatigue at least partially explained by OSA and chronic lumbar pain syndrome. She has hypertensive heart dz, chronic diastolic HF.  Recently had toe amputation for nonhealing ulcer 08/2019. Has chronic anx/dep as well.  Uses a cane to walk all the time when out of home, uses walker in home.    A/P as of last visit: "1) DM 2 with DPN:  stable per fasting home glucoses. HbA1c today.  Lytes/cr today. No med changes at this time.  2) HTN: The current medical regimen is effective;  continue present plan and medications. Lytes/cr today. Urine microalb/cr at next f/u visit in 3 mo.  3) HLD tolerating statin. 52 Feb 2020, hepatic panel normal at that time. Not fasting today. FLP and hepatic panel at next f/u in 3 mo.  4) Hypothyroidism: TSH normal Nov 2020. Plan recheck TSH next f/u in 3 mo.  5) Urge incontinence: stable on oxybutynin. Off myrbetriq b/c she thought it made prob worse. No sign of any infection recently."  INTERIM HX: Feeling tired as per her usual.  Otherwise doing fine. Chats with lady friends at [employer] 2-3 d/week, family/friends check on her lots, goes to church.  Checks fasting gluc 2 x/week, typically 140s.  Taking pioglit 30mg  qd.  HTN: Occ bp check at home 120/70 or better.  Hypoth: Takes T4 on empty stomach w/out any other meds.  HLD: tolerating statin well. No feet sores/ulcers or areas of concern.  Pain: LBP only-->denies legs/feet pain.  She cannot tell that lyrica has helped any at all.  Still seeing Dr. Nelva Bush for back injections and she RARELY takes an oxycodone tab (rx'd by Dr. Nelva Bush).  Mood and anxiety very stable lately, taking 1/2 of 15 mg remeron hs and cymbalta 60mg  qd.   PMP AWARE  reviewed today: most recent rx for lyrica was filled 02/10/20, # 180, rx by me. Most recent xanax rx filled 02/13/19, #30, rx by me. She can't be sure that she is still taking this med (?) No red flags.  Past Medical History:  Diagnosis Date  . Abdominal pain, right lower quadrant   . Cataracts, both eyes    soon to have cataract surg as of 03/2015  . Chronic diastolic heart failure ([place]) 2019  . Chronic pain syndrome    low back->opioids managed by Dr. Nelva Bush.  . Diabetes mellitus with complication (HCC)    DPN.  NO diab retinopathy as of 06/2017 eye exam.  . Diabetic foot ulcer ([place]) 11/2018   R 3rd toe  . Diabetic peripheral neuropathy associated with type 2 diabetes mellitus ([place])    uncomplicated diabetic ulcer R 3rd toe-->MRI both feet Neg for osteo 10/2018  . Diverticulosis of colon    noted on colonoscopy 2008  . Dyspnea    a. 09/2011 CTA Chest: No PE, Ca2+ cors;  b. 09/2011 R&L heart Cath: Relatively nl R heart pressures, nl co/ci, nonobs cad, nl EF.  12/2015 stress test normal, echo with grd II DD--likely explains DOE.  Worsening chronic DOE 2019-->cath w/no agiograph signif CAD.  Pulm HTN/DD.  EF 65%.  Cards started lasix.  Marland Kitchen External hemorrhoid   . Fatigue    w/ ? excessive daytime somnolence; ?  OSA--eval by Dr. Halford Chessman 06/2016, home sleep study ordered.  . Former smoker    30 pack-yrs, quit 2005  . Heart murmur    hx of  . Hx of adenomatous polyp of colon 08/25/2016   07/2016 no recall  . Hyperlipemia, mixed    trigs remain elevated (200s-300s range), but LDL at goal.  . Hypertension   . Hypertensive heart disease 2019  . Hypothyroidism   . IBS (irritable bowel syndrome)    IBS-D (Dr. Carlean Purl) + ? worsened by GI side effect of metformin.  NO pancreatic insufficiency.  As per 09/2017 GI f/u, plan is to continue dicyclomine + f/u with them on prn basis  . Interstitial cystitis   . Liver function study, abnormal   . Lumbar degenerative disc disease    Lumb spondylosis &  facet-mediated pain per Dr. Nelva Bush.  Has gotten repeated LB injections.  Eval by Dr. Tonita Cong 03/2017.  Spinal stenosis without neurogenic claudication.  RF neurotomy 05/2017 and 01/2018->on L and R L3 and L4 medial branch and L5 dorsal ramus nerves.  . Lumbar degenerative disc disease    Surgery not recommended, Dr. Rolena Infante and Dr. Ronnald Ramp.  Radiofreqency neuronotomy 04/18/19, Dr. Nelva Bush.  . Lumbar scoliosis    lumbosacral spondylosis w/out myelopathy (radiofrequency ablation/lesioning procedure planned for 05/2017).  . Macular degeneration    OS exudative (avastin treatments).  Advanced nonexudative OD, monitor.  AREDS  . Macular hole 07/05/2017   OD; surgically repaired 07/2017.  . Menopausal syndrome    restarted estradiol 2016  . Morton's neuroma   . Obesity, Class II, BMI 35-39.9   . OSA on CPAP 07/2016   HST 07/27/16 >> AHI 6.2, SaO2 low 79%. Did not tolerate CPAP.  Pt to try again as of 12/2017.  Compliant with CPAP as of 02/2018 pulm f/u.  . Osteoarthritis, multiple sites 2019   HIPs->surgery NOT recommended ([facility]).  +bilat feet.  . Other specified gastritis without mention of hemorrhage    hx of antral gastritis on EGD 05/2009  . Parkinson's disease ([place]) 2020   Carb/lev started by Dr. Carles Collet 08/2018  . Peripheral neuropathy    ? DPN?--per EMG 11/2012.  Intolerant of gabapentin, failed pamelor.  Radiofrequency neurotomy 12/11/14 helped LB pain (Dr. Nelva Bush)  . Pneumonia   . RLS (restless legs syndrome)   . Tarsal tunnel syndrome   . Trochanteric bursitis of both hips 2019   Injections: [facility]  . Unspecified venous (peripheral) insufficiency     Past Surgical History:  Procedure Laterality Date  . ABI/Arterial eval  02/25/2019   normal ankle arm index and normal triphasic waveforms bilat->no PAD->reassured by vasc surgeon  . ANKLE SURGERY     bilateral  . CARDIAC CATHETERIZATION  09/2011;11/2017   2013: Nonobstructive CAD. 2019: mild pulmonary hypertension. LV end diastolic pressure  is moderately elevated.  EF >65%.  DD/hypertensive heart dz-->BP control and add ASA 81mg  + lasix qd.  . CARDIOVASCULAR STRESS TEST  01/04/2016   Normal stress nuclear study with no ischemia or infarction; EF 82 with normal wall motion  . CATARACT EXTRACTION    . COLONOSCOPY  2002/2005, 11/14/06, and 05/2009   polyps 2002 but none 2005 or 2008 or 2011.  Repeat 08/17/16 for diarrhea: one diminutive adenoma + small polyps removed, diverticulosis noted--no further colonoscopies needed (Dr. Carlean Purl).  . CT angiogram chest  11/05/2017   NEG for PE.  Marland Kitchen DEXA  10/2016   NORMAL--consider repeat 2 yrs.  . ESOPHAGOGASTRODUODENOSCOPY  05/2009   Moderate antral  gastritis  . FOOT SURGERY     R foot bunionectomy and arthroplasty of digits R foot.  Arthroplasty digits 3 and 4 L foot.  Marland Kitchen HAMMER TOE SURGERY     bilateral  . left tarsal tunnel release     sept '11 (Dr Beola Cord)  . PARTIAL KNEE ARTHROPLASTY Right 11/06/2016   Procedure: UNICOMPARTMENTAL RIGHT KNEE- Medially;  Surgeon: Paralee Cancel, MD;  Location: [facility];  Service: Orthopedics;  Laterality: Right;  90 mins  . PFTs  12/05/2017   no obstruction, mild restriction. No reversibility. Moderate DLCO defect --continue prn albut  . radiofrequency neurotomy  06/18/2015   bilat L3-4 medial branch nerve and bilat L5 dorsal ramus nerve.(Dr. Nelva Bush)  . RIGHT/LEFT HEART CATH AND CORONARY ANGIOGRAPHY N/A 12/07/2017   Procedure: RIGHT/LEFT HEART CATH AND CORONARY ANGIOGRAPHY;  Surgeon: Leonie Man, MD;  Location: [facility];  Service: Cardiovascular;  Laterality: N/A;  . TOE AMPUTATION  08/2019   Right 3rd toe distal phalanx due to non-healing ulcer  . TONSILLECTOMY    . TOTAL ABDOMINAL HYSTERECTOMY W/ BILATERAL SALPINGOOPHORECTOMY    . TRANSTHORACIC ECHOCARDIOGRAM  08/2011; 2017   2013 Grade I DD; 2017 EF 60-65%, normal wall motion, grd II DD.    Outpatient Medications Prior to Visit  Medication Sig Dispense Refill  . aspirin EC 81 MG tablet Take  81 mg by mouth daily.    Marland Kitchen atorvastatin (LIPITOR) 20 MG tablet TAKE ONE TABLET BY MOUTH EVERY DAY 30 tablet 1  . azelastine (ASTELIN) 0.1 % nasal spray 2 sprays each nostril 20-30 min prior to wearing CPAP 30 mL 12  . calcium carbonate (CALCIUM 600) 600 MG TABS tablet Take 600 mg by mouth daily.     . carbidopa-levodopa (SINEMET IR) 25-100 MG tablet TAKE ONE TABLET BY MOUTH THREE TIMES DAILY 270 tablet 1  . celecoxib (CELEBREX) 200 MG capsule TAKE ONE CAPSULE BY MOUTH EVERY DAY 90 capsule 0  . diclofenac sodium (VOLTAREN) 1 % GEL Apply 2 g topically 4 (four) times daily.   1  . DULoxetine (CYMBALTA) 60 MG capsule TAKE ONE CAPSULE BY MOUTH EVERY DAY 90 capsule 0  . ferrous sulfate 325 (65 FE) MG tablet Take 1 tablet (325 mg total) by mouth 2 (two) times daily with a meal. 30 tablet 0  . furosemide (LASIX) 40 MG tablet Take 1 tablet (40 mg total) by mouth once a week. 30 tablet 3  . glucose blood test strip Use to check blood once daily 100 each 11  . Lancets 30G MISC Use to check blood sugar once daily 100 each 11  . levothyroxine (SYNTHROID) 25 MCG tablet TAKE ONE TABLET BY MOUTH EVERY DAY 30 tablet 2  . loratadine (CLARITIN) 10 MG tablet Take 10 mg by mouth daily as needed for allergies.    Marland Kitchen losartan-hydrochlorothiazide (HYZAAR) 50-12.5 MG tablet TAKE ONE & ONE-HALF TABLETS BY MOUTH EVERY DAY 45 tablet 1  . metoprolol tartrate (LOPRESSOR) 50 MG tablet TAKE ONE TABLET BY MOUTH TWICE DAILY 180 tablet 1  . mirtazapine (REMERON) 15 MG tablet TAKE 1/2 TABLET BY MOUTH AT BEDTIME 45 tablet 1  . Multiple Vitamins-Minerals (MULTIVITAMIN ADULT PO) Take 1 tablet by mouth daily.     . Multiple Vitamins-Minerals (PRESERVISION AREDS 2 PO) Take 1 tablet by mouth daily.     Marland Kitchen omeprazole (PRILOSEC) 20 MG capsule Take 1 capsule (20 mg total) by mouth daily. 90 capsule 1  . oxyCODONE-acetaminophen (PERCOCET/ROXICET) 5-325 MG tablet Take 1 tablet  by mouth 4 (four) times daily as needed.     . pioglitazone (ACTOS)  30 MG tablet TAKE ONE TABLET BY MOUTH DAILY 90 tablet 0  . potassium chloride SA (KLOR-CON) 20 MEQ tablet TAKE ONE TABLET BY MOUTH EVERY DAY 90 tablet 1  . pregabalin (LYRICA) 50 MG capsule TAKE ONE CAPSULE BY MOUTH TWICE DAILY 180 capsule 0  . ALPRAZolam (XANAX) 0.25 MG tablet Take 1 tablet (0.25 mg total) by mouth at bedtime as needed for anxiety. (Patient not taking: Reported on 02/25/2020) 30 tablet 1  . dicyclomine (BENTYL) 10 MG capsule Take 1 tab 2-3 times daily for diarrhea/urgency. (Patient not taking: Reported on 02/25/2020) 90 capsule 6  . glucose blood test strip OneTouch Verio test strips  USE TO CHECK BLOOD ONCE DAILY    . HYDROcodone-acetaminophen (NORCO/VICODIN) 5-325 MG tablet Take 1 tablet by mouth 3 (three) times daily as needed. (Patient not taking: Reported on 12/25/2019)  0  . ondansetron (ZOFRAN) 4 MG tablet Take 1 tablet by mouth as needed. (Patient not taking: Reported on 02/25/2020)    . oxybutynin (DITROPAN-XL) 10 MG 24 hr tablet Take 1 tablet (10 mg total) by mouth daily as needed (urine frequency). (Patient not taking: Reported on 02/25/2020) 30 tablet 6  . tiZANidine (ZANAFLEX) 4 MG tablet Take 4 mg by mouth every 8 (eight) hours as needed for muscle spasms.  (Patient not taking: Reported on 02/25/2020)  1   No facility-administered medications prior to visit.    Allergies  Allergen Reactions  . Gabapentin Itching  . Sulfonamide Derivatives Nausea Only    REACTION: Nausea    ROS As per HPI  PE: Vitals with BMI 02/25/2020 12/25/2019 12/11/2019  Height 5\' 2"  5\' 2"  5\' 2"   Weight 205 lbs 10 oz 200 lbs 204 lbs  BMI 37.6 76.28 31.5  Systolic 176 - 160  Diastolic 67 - 52  Pulse 61 - 74     Gen: Alert, well appearing.  Patient is oriented to person, place, time, and situation. AFFECT: pleasant, lucid thought and speech. CV: RRR, trace syst ejection murmur, no r/g.   LUNGS: CTA bilat, nonlabored resps, good aeration in all lung fields. EXT: no clubbing or cyanosis.   1+ bilat LL pitting edema.    LABS:  Lab Results  Component Value Date   TSH 3.45 02/06/2019   Lab Results  Component Value Date   WBC 8.9 05/09/2018   HGB 11.1 05/09/2018   HCT 32.5 (L) 05/09/2018   MCV 92 05/09/2018   PLT 327 05/09/2018   Lab Results  Component Value Date   IRON 74 11/01/2017   IRON 92 11/01/2017   TIBC 373 11/01/2017   FERRITIN 42.4 11/01/2017   Lab Results  Component Value Date   VITAMINB12 1,316 (H) 07/10/2016   Lab Results  Component Value Date   CREATININE 0.82 11/25/2019   BUN 29 (H) 11/25/2019   NA 139 11/25/2019   K 4.4 11/25/2019   CL 99 11/25/2019   CO2 31 11/25/2019   Lab Results  Component Value Date   ALT 21 02/06/2019   AST 17 02/06/2019   ALKPHOS 98 02/06/2019   BILITOT 0.4 02/06/2019   Lab Results  Component Value Date   CHOL 161 02/06/2019   Lab Results  Component Value Date   HDL 44.70 02/06/2019   Lab Results  Component Value Date   LDLCALC 52 05/09/2018   Lab Results  Component Value Date   TRIG 307.0 (H) 02/06/2019  Lab Results  Component Value Date   CHOLHDL 4 02/06/2019   Lab Results  Component Value Date   HGBA1C 6.3 11/25/2019   IMPRESSION AND PLAN:  She's doing pretty well:  1) Chronic LBP: D/C lyrica->not helping. Cont f/u with Dr. Nelva Bush.  2) DM 2: well controlled. Hba1c and urine microalb/cr today. Cont pioglit 30 qd.  3) HTN: well controlled.  Cont losart-hctz 50-12.5 -->1 and 1/2 tabs qd + lopressor 50mg  bid. Lytes/cr today.  4) HLD: tolerating atorva 20mg  qd. FLP and hepatic panel today.  5) Hypothyroidism: taking T4 correctly.  Cont 25 mcg qd and check TSH today.  6) Anx/dep: doing well.  Cont remeron and cymbalta. No new rx for Mikle Bosworth today--not clear whether she is even taking this med anymore.   An After Visit Summary was printed and given to the patient.  FOLLOW UP: Return in about 3 months (around 05/25/2020) for routine chronic illness f/u.  Signed:  Crissie Sickles, MD            02/25/2020

## 2020-02-25 NOTE — Patient Instructions (Signed)
You can stop taking your lyrica (also called pregabalin).  I'll check your blood count today and if it is normal then you can also stop taking your iron tablet.

## 2020-02-26 ENCOUNTER — Ambulatory Visit
Admission: RE | Admit: 2020-02-26 | Discharge: 2020-02-26 | Disposition: A | Discharge status: Home / Self Care | Payer: Medicare Other | Source: Ambulatory Visit | Attending: Family Medicine | Admitting: Family Medicine

## 2020-02-26 DIAGNOSIS — Z1231 Encounter for screening mammogram for malignant neoplasm of breast: Secondary | ICD-10-CM

## 2020-02-29 ENCOUNTER — Encounter: Payer: Self-pay | Admitting: Family Medicine

## 2020-03-01 ENCOUNTER — Other Ambulatory Visit: Payer: Self-pay

## 2020-03-01 DIAGNOSIS — E1142 Type 2 diabetes mellitus with diabetic polyneuropathy: Secondary | ICD-10-CM

## 2020-03-01 MED ORDER — GLUCOSE BLOOD VI STRP: ORAL_STRIP | 11 refills | Status: DC

## 2020-03-01 MED ORDER — GLUCOSE BLOOD VI STRP: ORAL_STRIP | 11 refills | Status: AC

## 2020-03-01 MED ORDER — LANCETS 30G MISC: 11 refills | Status: DC

## 2020-03-01 MED ORDER — LANCETS 30G MISC: 11 refills | Status: AC

## 2020-03-04 DIAGNOSIS — Z961 Presence of intraocular lens: Secondary | ICD-10-CM | POA: Diagnosis not present

## 2020-03-04 DIAGNOSIS — H353120 Nonexudative age-related macular degeneration, left eye, stage unspecified: Secondary | ICD-10-CM | POA: Diagnosis not present

## 2020-03-04 DIAGNOSIS — H52223 Regular astigmatism, bilateral: Secondary | ICD-10-CM | POA: Diagnosis not present

## 2020-03-04 DIAGNOSIS — H524 Presbyopia: Secondary | ICD-10-CM | POA: Diagnosis not present

## 2020-03-10 ENCOUNTER — Other Ambulatory Visit: Payer: Self-pay | Admitting: Family Medicine

## 2020-03-22 ENCOUNTER — Other Ambulatory Visit: Payer: Self-pay | Admitting: Family Medicine

## 2020-03-22 ENCOUNTER — Telehealth: Payer: Self-pay

## 2020-03-22 DIAGNOSIS — I10 Essential (primary) hypertension: Secondary | ICD-10-CM

## 2020-03-22 NOTE — Telephone Encounter (Signed)
Please advise, thanks.

## 2020-03-22 NOTE — Telephone Encounter (Signed)
[facility] called stating that losartan-hydrochlorothiazide 50-12.5 is on back order and they wanted to see if they could give her another dosage to split in half. Please give them a call at [phone].

## 2020-03-22 NOTE — Telephone Encounter (Signed)
Yes, okay to authorize losartan-hctz 100-25mg , take 1/2 tab daily, #15, RF x 3.

## 2020-03-23 DIAGNOSIS — M47816 Spondylosis without myelopathy or radiculopathy, lumbar region: Secondary | ICD-10-CM | POA: Diagnosis not present

## 2020-03-23 MED ORDER — IRBESARTAN-HYDROCHLOROTHIAZIDE 150-12.5 MG PO TABS: 1.0000 | ORAL_TABLET | Freq: Every day | ORAL | 6 refills | Status: AC

## 2020-03-23 NOTE — Telephone Encounter (Signed)
OK I'll eRx different bp med to take place of losartan-hctz. (new med will be irbesartan-hctz, 150-12.5, 1 tab daily).

## 2020-03-23 NOTE — Telephone Encounter (Signed)
All of the losartan-hctz doses are on back order currently. Please advise, thanks.

## 2020-03-23 NOTE — Telephone Encounter (Signed)
Pt aware of Rx change 

## 2020-03-29 ENCOUNTER — Encounter: Payer: Self-pay | Admitting: Family Medicine

## 2020-03-29 ENCOUNTER — Other Ambulatory Visit: Payer: Self-pay

## 2020-03-30 ENCOUNTER — Encounter: Payer: Self-pay | Admitting: Family Medicine

## 2020-03-30 ENCOUNTER — Ambulatory Visit (INDEPENDENT_AMBULATORY_CARE_PROVIDER_SITE_OTHER): Payer: Medicare Other | Admitting: Family Medicine

## 2020-03-30 VITALS — BP 107/69 | HR 61 | Temp 97.5°F | Resp 16 | Ht 62.0 in | Wt 202.0 lb

## 2020-03-30 DIAGNOSIS — M7061 Trochanteric bursitis, right hip: Secondary | ICD-10-CM | POA: Diagnosis not present

## 2020-03-30 DIAGNOSIS — M7062 Trochanteric bursitis, left hip: Secondary | ICD-10-CM | POA: Diagnosis not present

## 2020-03-30 MED ORDER — PREDNISONE 20 MG PO TABS: ORAL_TABLET | ORAL | 0 refills | Status: AC

## 2020-03-30 NOTE — Patient Instructions (Signed)
Stop your celebrex (celecoxib) while you are taking the prednisone. When finished with prednisone in 10 days then you can restart celebrex.  OK to apply the voltaren gel to area of pain in each hip ---3-4 times per day.

## 2020-03-30 NOTE — Progress Notes (Signed)
OFFICE VISIT  03/30/2020  CC:  Chief Complaint  Patient presents with  . Leg pain    The left leg is the worst. They kept me awake most of the night and have been hurting off and on during the day.  The right leg  aches on the outside edge from my hip down.    HPI:    Patient is a 87 y.o. Caucasian female who presents for leg pain. Onset of pain about 1 mo ago, lateral aspects of legs from around greater troch level where it is most intense, extends down lateral aspect of thigh and knee into prox LL but less intense.  Worse when lying on side.  No preceding injury/trauma. L side>R.  Lets up some later in day as she gets around more/walking more. Tylenol 1000 mg helps.  No change in level of mild LL edema. Feet with chronic numbness that is worse in mornings. No fevers.  Past Medical History:  Diagnosis Date  . Abdominal pain, right lower quadrant   . Cataracts, both eyes    soon to have cataract surg as of 03/2015  . Chronic diastolic heart failure ([place]) 2019  . Chronic pain syndrome    low back->opioids managed by Dr. Nelva Bush.  . Diabetes mellitus with complication (HCC)    DPN.  NO diab retinopathy as of 06/2017 eye exam.  . Diabetic peripheral neuropathy associated with type 2 diabetes mellitus ([place])   . Diverticulosis of colon    noted on colonoscopy 2008  . Dyspnea    a. 09/2011 CTA Chest: No PE, Ca2+ cors;  b. 09/2011 R&L heart Cath: Relatively nl R heart pressures, nl co/ci, nonobs cad, nl EF.  12/2015 stress test normal, echo with grd II DD--likely explains DOE.  Worsening chronic DOE 2019-->cath w/no agiograph signif CAD.  Pulm HTN/DD.  EF 65%.  Cards started lasix.  Marland Kitchen External hemorrhoid   . Fatigue    w/ ? excessive daytime somnolence; ? OSA--eval by Dr. Halford Chessman 06/2016, home sleep study ordered.  . Former smoker    30 pack-yrs, quit 2005  . Heart murmur    hx of  . Hx of adenomatous polyp of colon 08/25/2016   07/2016 no recall  . Hyperlipemia, mixed    trigs remain  elevated (200s-300s range), but LDL at goal.  . Hypertension   . Hypertensive heart disease 2019  . Hypothyroidism   . IBS (irritable bowel syndrome)    IBS-D (Dr. Carlean Purl) + ? worsened by GI side effect of metformin.  NO pancreatic insufficiency.  As per 09/2017 GI f/u, plan is to continue dicyclomine + f/u with them on prn basis  . Interstitial cystitis   . Liver function study, abnormal   . Lumbar degenerative disc disease    Lumb spondylosis & facet-mediated pain per Dr. Nelva Bush.  Has gotten repeated LB injections.  Eval by Dr. Tonita Cong 03/2017.  Spinal stenosis without neurogenic claudication.  RF neurotomy 05/2017 and 01/2018->on L and R L3 and L4 medial branch and L5 dorsal ramus nerves.  . Lumbar degenerative disc disease    Surgery not recommended, Dr. Rolena Infante and Dr. Ronnald Ramp.  Radiofreqency neuronotomy 04/18/19, Dr. Nelva Bush.  . Lumbar scoliosis    lumbosacral spondylosis w/out myelopathy (radiofrequency ablation/lesioning procedure planned for 05/2017).  . Macular degeneration    OS exudative (avastin treatments).  Advanced nonexudative OD, monitor.  AREDS  . Macular hole 07/05/2017   OD; surgically repaired 07/2017.  . Menopausal syndrome    restarted estradiol 2016  .  Morton's neuroma   . Obesity, Class II, BMI 35-39.9   . OSA on CPAP 07/2016   HST 07/27/16 >> AHI 6.2, SaO2 low 79%. Did not tolerate CPAP.  Pt to try again as of 12/2017.  Compliant with CPAP as of 02/2018 pulm f/u.  . Osteoarthritis, multiple sites 2019   HIPs->surgery NOT recommended ([facility]).  +bilat feet.  . Other specified gastritis without mention of hemorrhage    hx of antral gastritis on EGD 05/2009  . Parkinson's disease (HCC) 2020   Carb/lev started by Dr. Arbutus Leas 08/2018  . Peripheral neuropathy    ? DPN?--per EMG 11/2012.  Intolerant of gabapentin, failed pamelor.  Radiofrequency neurotomy 12/11/14 helped LB pain (Dr. Ethelene Hal)  . Pneumonia   . RLS (restless legs syndrome)   . Tarsal tunnel syndrome   . Trochanteric  bursitis of both hips 2019   Injections: [facility]  . Unspecified venous (peripheral) insufficiency     Past Surgical History:  Procedure Laterality Date  . ABI/Arterial eval  02/25/2019   normal ankle arm index and normal triphasic waveforms bilat->no PAD->reassured by vasc surgeon  . ANKLE SURGERY     bilateral  . CARDIAC CATHETERIZATION  09/2011;11/2017   2013: Nonobstructive CAD. 2019: mild pulmonary hypertension. LV end diastolic pressure is moderately elevated.  EF >65%.  DD/hypertensive heart dz-->BP control and add ASA 81mg  + lasix qd.  . CARDIOVASCULAR STRESS TEST  01/04/2016   Normal stress nuclear study with no ischemia or infarction; EF 82 with normal wall motion  . CATARACT EXTRACTION    . COLONOSCOPY  2002/2005, 11/14/06, and 05/2009   polyps 2002 but none 2005 or 2008 or 2011.  Repeat 08/17/16 for diarrhea: one diminutive adenoma + small polyps removed, diverticulosis noted--no further colonoscopies needed (Dr. 08/19/16).  . CT angiogram chest  11/05/2017   NEG for PE.  01/05/2018 DEXA  10/2016   NORMAL--consider repeat 2 yrs.  . ESOPHAGOGASTRODUODENOSCOPY  05/2009   Moderate antral gastritis  . FOOT SURGERY     R foot bunionectomy and arthroplasty of digits R foot.  Arthroplasty digits 3 and 4 L foot.  06/2009 HAMMER TOE SURGERY     bilateral  . left tarsal tunnel release     sept '11 (Dr 01-09-1993)  . PARTIAL KNEE ARTHROPLASTY Right 11/06/2016   Procedure: UNICOMPARTMENTAL RIGHT KNEE- Medially;  Surgeon: 11/08/2016, MD;  Location: [facility];  Service: Orthopedics;  Laterality: Right;  90 mins  . PFTs  12/05/2017   no obstruction, mild restriction. No reversibility. Moderate DLCO defect --continue prn albut  . radiofrequency neurotomy  06/18/2015   bilat L3-4 medial branch nerve and bilat L5 dorsal ramus nerve.(Dr. 06/20/2015)  . RIGHT/LEFT HEART CATH AND CORONARY ANGIOGRAPHY N/A 12/07/2017   Procedure: RIGHT/LEFT HEART CATH AND CORONARY ANGIOGRAPHY;  Surgeon: 12/09/2017, MD;  Location:  [facility]ASIVE [facility];  Service: Cardiovascular;  Laterality: N/A;  . TOE AMPUTATION  08/2019   Right 3rd toe distal phalanx due to non-healing ulcer  . TONSILLECTOMY    . TOTAL ABDOMINAL HYSTERECTOMY W/ BILATERAL SALPINGOOPHORECTOMY    . TRANSTHORACIC ECHOCARDIOGRAM  08/2011; 2017   2013 Grade I DD; 2017 EF 60-65%, normal wall motion, grd II DD.    Outpatient Medications Prior to Visit  Medication Sig Dispense Refill  . aspirin EC 81 MG tablet Take 81 mg by mouth daily.    2018 atorvastatin (LIPITOR) 20 MG tablet TAKE ONE TABLET BY MOUTH EVERY DAY 30 tablet 1  . azelastine (ASTELIN) 0.1 %  nasal spray 2 sprays each nostril 20-30 min prior to wearing CPAP 30 mL 12  . calcium carbonate (OS-CAL) 600 MG TABS tablet Take 600 mg by mouth daily.     . carbidopa-levodopa (SINEMET IR) 25-100 MG tablet TAKE ONE TABLET BY MOUTH THREE TIMES DAILY 270 tablet 1  . celecoxib (CELEBREX) 200 MG capsule TAKE ONE CAPSULE BY MOUTH EVERY DAY 90 capsule 0  . diclofenac sodium (VOLTAREN) 1 % GEL Apply 2 g topically 4 (four) times daily.   1  . DULoxetine (CYMBALTA) 60 MG capsule TAKE ONE CAPSULE BY MOUTH DAILY 90 capsule 0  . ferrous sulfate 325 (65 FE) MG tablet Take 1 tablet (325 mg total) by mouth 2 (two) times daily with a meal. 30 tablet 0  . furosemide (LASIX) 40 MG tablet Take 1 tablet (40 mg total) by mouth once a week. 30 tablet 3  . glucose blood test strip Use to check blood once daily 100 each 11  . irbesartan-hydrochlorothiazide (AVALIDE) 150-12.5 MG tablet Take 1 tablet by mouth daily. 30 tablet 6  . Lancets 30G MISC Use to check blood sugar once daily 100 each 11  . levothyroxine (SYNTHROID) 25 MCG tablet TAKE ONE TABLET BY MOUTH EVERY DAY 30 tablet 2  . loratadine (CLARITIN) 10 MG tablet Take 10 mg by mouth daily as needed for allergies.    . metoprolol tartrate (LOPRESSOR) 50 MG tablet TAKE ONE TABLET BY MOUTH TWICE DAILY 180 tablet 1  . mirtazapine (REMERON) 15 MG tablet TAKE 1/2 TABLET BY MOUTH AT  BEDTIME 45 tablet 1  . Multiple Vitamins-Minerals (MULTIVITAMIN ADULT PO) Take 1 tablet by mouth daily.     . Multiple Vitamins-Minerals (PRESERVISION AREDS 2 PO) Take 1 tablet by mouth daily.     Marland Kitchen omeprazole (PRILOSEC) 20 MG capsule Take 1 capsule (20 mg total) by mouth daily. 90 capsule 1  . pioglitazone (ACTOS) 30 MG tablet TAKE ONE TABLET BY MOUTH DAILY 90 tablet 0  . potassium chloride SA (KLOR-CON) 20 MEQ tablet TAKE ONE TABLET BY MOUTH EVERY DAY 90 tablet 1  . ALPRAZolam (XANAX) 0.25 MG tablet Take 1 tablet (0.25 mg total) by mouth at bedtime as needed for anxiety. (Patient not taking: No sig reported) 30 tablet 1  . dicyclomine (BENTYL) 10 MG capsule Take 1 tab 2-3 times daily for diarrhea/urgency. (Patient not taking: No sig reported) 90 capsule 6  . oxyCODONE-acetaminophen (PERCOCET/ROXICET) 5-325 MG tablet Take 1 tablet by mouth 4 (four) times daily as needed.  (Patient not taking: Reported on 03/30/2020)     No facility-administered medications prior to visit.    Allergies  Allergen Reactions  . Gabapentin Itching  . Sulfonamide Derivatives Nausea Only    REACTION: Nausea    ROS As per HPI  PE: Vitals with BMI 03/30/2020 02/25/2020 12/25/2019  Height 5\' 2"  5\' 2"  5\' 2"   Weight 202 lbs 205 lbs 10 oz 200 lbs  BMI 36.94 [id number]  Systolic [id number] -  Diastolic 69 67 -  Pulse 61 61 -     Gen: Alert, well appearing.  Patient is oriented to person, place, time, and situation. AFFECT: pleasant, lucid thought and speech. CV: RRR, no m/r/g.   LUNGS: CTA bilat, nonlabored resps, good aeration in all lung fields. She has TTP over greater trochanter region of both hips, otherwise is nontender and has no pain with hips ROM.  LABS:  Lab Results  Component Value Date   TSH 2.28 02/25/2020   Lab Results  Component Value Date   WBC 6.0 02/25/2020   HGB 11.9 (L) 02/25/2020   HCT 35.8 (L) 02/25/2020   MCV 93.9 02/25/2020   PLT 278.0 02/25/2020   Lab Results  Component Value  Date   CREATININE 0.88 02/25/2020   BUN 25 (H) 02/25/2020   NA 140 02/25/2020   K 3.8 02/25/2020   CL 100 02/25/2020   CO2 31 02/25/2020   Lab Results  Component Value Date   ALT 17 02/25/2020   AST 16 02/25/2020   ALKPHOS 99 02/25/2020   BILITOT 0.4 02/25/2020   Lab Results  Component Value Date   CHOL 148 02/25/2020   Lab Results  Component Value Date   HDL 45.40 02/25/2020   Lab Results  Component Value Date   LDLCALC 52 05/09/2018   Lab Results  Component Value Date   TRIG 299.0 (H) 02/25/2020   Lab Results  Component Value Date   CHOLHDL 3 02/25/2020   Lab Results  Component Value Date   HGBA1C 6.4 02/25/2020    IMPRESSION AND PLAN:  Bilat trochanteric bursitis. Plan: prednisone 40mg  qd x 5d, then 20mg  qd x 5d. Voltaren gel to greater troch region 3-4 times per day. Stop celebrex while taking prednisone.  Has f/u with Dr. Nelva Bush for her chronic LBP towards the end of this month and she will also address any ongoing problem with hips at that time. She can always f/u with me sooner if needed.  An After Visit Summary was printed and given to the patient.  FOLLOW UP: Return for keep f/u already set for march this year.  Signed:  Crissie Sickles, MD           03/30/2020

## 2020-04-01 DIAGNOSIS — E1351 Other specified diabetes mellitus with diabetic peripheral angiopathy without gangrene: Secondary | ICD-10-CM | POA: Diagnosis not present

## 2020-04-01 DIAGNOSIS — L84 Corns and callosities: Secondary | ICD-10-CM | POA: Diagnosis not present

## 2020-04-01 DIAGNOSIS — L602 Onychogryphosis: Secondary | ICD-10-CM | POA: Diagnosis not present

## 2020-04-02 ENCOUNTER — Encounter: Payer: Self-pay | Admitting: Family Medicine

## 2020-04-02 MED ORDER — CLOTRIMAZOLE-BETAMETHASONE 1-0.05 % EX CREA: 1.0000 "application " | TOPICAL_CREAM | Freq: Two times a day (BID) | CUTANEOUS | 1 refills | Status: AC

## 2020-04-02 NOTE — Telephone Encounter (Signed)
OK, lotrisone cream eRx'd.

## 2020-04-05 DIAGNOSIS — H43812 Vitreous degeneration, left eye: Secondary | ICD-10-CM | POA: Diagnosis not present

## 2020-04-05 DIAGNOSIS — E119 Type 2 diabetes mellitus without complications: Secondary | ICD-10-CM | POA: Diagnosis not present

## 2020-04-05 DIAGNOSIS — H35372 Puckering of macula, left eye: Secondary | ICD-10-CM | POA: Diagnosis not present

## 2020-04-05 DIAGNOSIS — H353222 Exudative age-related macular degeneration, left eye, with inactive choroidal neovascularization: Secondary | ICD-10-CM | POA: Diagnosis not present

## 2020-04-05 DIAGNOSIS — H353113 Nonexudative age-related macular degeneration, right eye, advanced atrophic without subfoveal involvement: Secondary | ICD-10-CM | POA: Diagnosis not present

## 2020-04-08 DIAGNOSIS — Z79891 Long term (current) use of opiate analgesic: Secondary | ICD-10-CM | POA: Diagnosis not present

## 2020-04-08 DIAGNOSIS — M7062 Trochanteric bursitis, left hip: Secondary | ICD-10-CM | POA: Diagnosis not present

## 2020-04-08 DIAGNOSIS — M7061 Trochanteric bursitis, right hip: Secondary | ICD-10-CM | POA: Diagnosis not present

## 2020-04-09 ENCOUNTER — Encounter: Payer: Self-pay | Admitting: Family Medicine

## 2020-04-09 MED ORDER — FLUCONAZOLE 150 MG PO TABS: ORAL_TABLET | ORAL | 0 refills | Status: AC

## 2020-04-09 NOTE — Telephone Encounter (Signed)
Fluconazole tabs eRx'd. 

## 2020-04-14 ENCOUNTER — Telehealth: Payer: Self-pay

## 2020-04-14 ENCOUNTER — Other Ambulatory Visit: Payer: Self-pay

## 2020-04-14 MED ORDER — LEVOTHYROXINE SODIUM 25 MCG PO TABS: 25.0000 ug | ORAL_TABLET | Freq: Every day | ORAL | 0 refills | Status: AC

## 2020-04-14 NOTE — Telephone Encounter (Signed)
FYI. Please see below.  Pharmacy is changing manufacturers for levothyroxine. Old manufacturer/NDC: [address]-01/[employer]. New manufacturer/NDC:[address]-46/[employer]

## 2020-04-15 DIAGNOSIS — N3020 Other chronic cystitis without hematuria: Secondary | ICD-10-CM | POA: Diagnosis not present

## 2020-04-15 DIAGNOSIS — R3915 Urgency of urination: Secondary | ICD-10-CM | POA: Diagnosis not present

## 2020-04-26 ENCOUNTER — Other Ambulatory Visit: Payer: Self-pay | Admitting: Family Medicine

## 2020-05-03 DIAGNOSIS — M2041 Other hammer toe(s) (acquired), right foot: Secondary | ICD-10-CM | POA: Diagnosis not present

## 2020-05-03 DIAGNOSIS — M24574 Contracture, right foot: Secondary | ICD-10-CM | POA: Diagnosis not present

## 2020-05-03 DIAGNOSIS — L602 Onychogryphosis: Secondary | ICD-10-CM | POA: Diagnosis not present

## 2020-05-03 DIAGNOSIS — E1351 Other specified diabetes mellitus with diabetic peripheral angiopathy without gangrene: Secondary | ICD-10-CM | POA: Diagnosis not present

## 2020-05-04 ENCOUNTER — Encounter: Payer: Self-pay | Admitting: Family Medicine

## 2020-05-05 NOTE — Telephone Encounter (Signed)
Please send a few clinic notes and procedure notes to pt who is moving to [facility]

## 2020-05-07 NOTE — Telephone Encounter (Signed)
Unsuccessful attempts to verify appt scheduled with Dr. [name] on 05/25/20.  Mailbox is full on mobile. Home number listed no voicemail option. I will try again at another time. 2/11 dnh

## 2020-05-07 NOTE — Telephone Encounter (Signed)
Per patient message she will be requesting her records.  Will forward to medical records department.

## 2020-05-10 ENCOUNTER — Telehealth: Payer: Self-pay

## 2020-05-10 ENCOUNTER — Other Ambulatory Visit: Payer: Self-pay | Admitting: Neurology

## 2020-05-10 ENCOUNTER — Other Ambulatory Visit: Payer: Self-pay | Admitting: Family Medicine

## 2020-05-10 DIAGNOSIS — E1351 Other specified diabetes mellitus with diabetic peripheral angiopathy without gangrene: Secondary | ICD-10-CM | POA: Diagnosis not present

## 2020-05-10 DIAGNOSIS — L602 Onychogryphosis: Secondary | ICD-10-CM | POA: Diagnosis not present

## 2020-05-10 DIAGNOSIS — M2041 Other hammer toe(s) (acquired), right foot: Secondary | ICD-10-CM | POA: Diagnosis not present

## 2020-05-10 DIAGNOSIS — M24574 Contracture, right foot: Secondary | ICD-10-CM | POA: Diagnosis not present

## 2020-05-10 NOTE — Telephone Encounter (Signed)
Documentation only  Spoke to patient to verify date she will be moving and to inquire if she plans on attending her visit that is scheduled on 05/25/20 with Dr. [name].  Patient stated that she is leaving tomorrow 2/15 to go to [place] to be with her oldest son.  She will not need appt on 3/1.  She will request records once she finds new PCP and have their office send release for medical records.  She wanted to make sure I told Dr. [name], Vevelyn Royals and Lorriane Shire and the ladies up front, "thank you for everything".  She hates to leave [place], and our office, she absolutely loves Dr. [name], stated "there's not a doctor around as good as he has been to her, she has been his patient since he first started at the [place] office. She sent in refills on her prescriptions she will need for now.  No callback needed at this time.

## 2020-05-18 ENCOUNTER — Ambulatory Visit: Payer: Medicare Other | Admitting: Internal Medicine

## 2020-05-25 ENCOUNTER — Ambulatory Visit: Payer: Medicare Other | Admitting: Family Medicine

## 2020-05-25 DIAGNOSIS — M47816 Spondylosis without myelopathy or radiculopathy, lumbar region: Secondary | ICD-10-CM | POA: Diagnosis not present

## 2020-05-25 DIAGNOSIS — R531 Weakness: Secondary | ICD-10-CM | POA: Diagnosis not present

## 2020-05-25 DIAGNOSIS — M159 Polyosteoarthritis, unspecified: Secondary | ICD-10-CM | POA: Diagnosis not present

## 2020-05-25 DIAGNOSIS — G20 Parkinson's disease: Secondary | ICD-10-CM | POA: Diagnosis not present

## 2020-05-27 DIAGNOSIS — D649 Anemia, unspecified: Secondary | ICD-10-CM | POA: Diagnosis not present

## 2020-05-27 DIAGNOSIS — I10 Essential (primary) hypertension: Secondary | ICD-10-CM | POA: Diagnosis not present

## 2020-05-27 DIAGNOSIS — E785 Hyperlipidemia, unspecified: Secondary | ICD-10-CM | POA: Diagnosis not present

## 2020-05-27 DIAGNOSIS — E039 Hypothyroidism, unspecified: Secondary | ICD-10-CM | POA: Diagnosis not present

## 2020-05-27 DIAGNOSIS — E550 Rickets, active: Secondary | ICD-10-CM | POA: Diagnosis not present

## 2020-06-21 DIAGNOSIS — F411 Generalized anxiety disorder: Secondary | ICD-10-CM | POA: Diagnosis not present

## 2020-06-21 NOTE — Progress Notes (Deleted)
Virtual Visit Via Video   The purpose of this virtual visit is to provide medical care while limiting exposure to the novel coronavirus.    Consent was obtained for video visit:  {yes no:[id number]} Answered questions that patient had about telehealth interaction:  {yes no:[id number]} I discussed the limitations, risks, security and privacy concerns of performing an evaluation and management service by telemedicine. I also discussed with the patient that there may be a patient responsible charge related to this service. The patient expressed understanding and agreed to proceed.  Pt location: Home Physician Location: office Name of referring provider:  Tammi Sou, MD I connected with Katherine Best at patients initiation/request on 06/24/2020 at  2:30 PM EDT by video enabled telemedicine application and verified that I am speaking with the correct person using two identifiers. Pt MRN:  [id number] Pt DOB:  Aug 12, 1933 Video Participants:  Chancy Milroy Gullickson;  ***  Assessment/Plan:   ***1.  Parkinson's disease  -Discussed again using an alarm to remember the middle of the day dose of levodopa.  I would like her to take carbidopa/levodopa 25/100, 1 tablet at 8 AM/noon/4 PM  2.  Chronic low back pain  -Follows with Dr. Herma Mering  3.  Discussed with the patient that I really would like to see her in person.  She has told me for quite some time that she cannot get to the office, but I have noticed that she is going to other physician offices.  She does state that it is harder to get around in my office.  I told her that we have [place] parking currently.  Subjective   ***Patient seen today in follow-up for Parkinson's disease.  Pt denies falls.  Pt denies lightheadedness, near syncope.  No hallucinations.  Mood has been good.  Current movement d/o meds:  ***Carbidopa/levodopa 25/100, 1 tablet 3 times per day (missed the middle of the day dose often)   Current Outpatient Medications on File  Prior to Visit  Medication Sig Dispense Refill  . ALPRAZolam (XANAX) 0.25 MG tablet Take 1 tablet (0.25 mg total) by mouth at bedtime as needed for anxiety. (Patient not taking: No sig reported) 30 tablet 1  . aspirin EC 81 MG tablet Take 81 mg by mouth daily.    Marland Kitchen atorvastatin (LIPITOR) 20 MG tablet TAKE ONE TABLET BY MOUTH DAILY 30 tablet 2  . azelastine (ASTELIN) 0.1 % nasal spray 2 sprays each nostril 20-30 min prior to wearing CPAP 30 mL 12  . calcium carbonate (OS-CAL) 600 MG TABS tablet Take 600 mg by mouth daily.     . carbidopa-levodopa (SINEMET IR) 25-100 MG tablet TAKE ONE TABLET BY MOUTH THREE TIMES DAILY 270 tablet 1  . celecoxib (CELEBREX) 200 MG capsule TAKE ONE CAPSULE BY MOUTH EVERY DAY 90 capsule 1  . clotrimazole-betamethasone (LOTRISONE) cream Apply 1 application topically 2 (two) times daily. 15 g 1  . diclofenac sodium (VOLTAREN) 1 % GEL Apply 2 g topically 4 (four) times daily.   1  . dicyclomine (BENTYL) 10 MG capsule Take 1 tab 2-3 times daily for diarrhea/urgency. (Patient not taking: No sig reported) 90 capsule 6  . DULoxetine (CYMBALTA) 60 MG capsule TAKE ONE CAPSULE BY MOUTH EVERY DAY 90 capsule 1  . ferrous sulfate 325 (65 FE) MG tablet Take 1 tablet (325 mg total) by mouth 2 (two) times daily with a meal. 30 tablet 0  . fluconazole (DIFLUCAN) 150 MG tablet 1 tab po qd x 3d  3 tablet 0  . furosemide (LASIX) 40 MG tablet Take 1 tablet (40 mg total) by mouth once a week. 30 tablet 3  . glucose blood test strip Use to check blood once daily 100 each 11  . irbesartan-hydrochlorothiazide (AVALIDE) 150-12.5 MG tablet Take 1 tablet by mouth daily. 30 tablet 6  . Lancets 30G MISC Use to check blood sugar once daily 100 each 11  . levothyroxine (SYNTHROID) 25 MCG tablet Take 1 tablet (25 mcg total) by mouth daily. 90 tablet 0  . loratadine (CLARITIN) 10 MG tablet Take 10 mg by mouth daily as needed for allergies.    . metoprolol tartrate (LOPRESSOR) 50 MG tablet TAKE ONE  TABLET BY MOUTH TWICE DAILY 180 tablet 1  . mirtazapine (REMERON) 15 MG tablet TAKE 1/2 TABLET BY MOUTH AT BEDTIME 45 tablet 1  . Multiple Vitamins-Minerals (MULTIVITAMIN ADULT PO) Take 1 tablet by mouth daily.     . Multiple Vitamins-Minerals (PRESERVISION AREDS 2 PO) Take 1 tablet by mouth daily.     Marland Kitchen omeprazole (PRILOSEC) 20 MG capsule Take 1 capsule (20 mg total) by mouth daily. 90 capsule 1  . oxyCODONE-acetaminophen (PERCOCET/ROXICET) 5-325 MG tablet Take 1 tablet by mouth 4 (four) times daily as needed.  (Patient not taking: Reported on 03/30/2020)    . pioglitazone (ACTOS) 30 MG tablet TAKE ONE TABLET BY MOUTH EVERY DAY 90 tablet 1  . potassium chloride SA (KLOR-CON) 20 MEQ tablet TAKE ONE TABLET BY MOUTH DAILY 90 tablet 1  . predniSONE (DELTASONE) 20 MG tablet 2 tabs po qd x 5d, then 1 tab po qd x 5d 15 tablet 0  . pregabalin (LYRICA) 50 MG capsule TAKE ONE CAPSULE BY MOUTH TWICE DAILY 180 capsule 1   No current facility-administered medications on file prior to visit.     Objective   There were no vitals filed for this visit. GEN:  The patient appears stated age and is in NAD.  Neurological examination:  Orientation: The patient is alert and oriented x3. Cranial nerves: There is good facial symmetry. There is ***facial hypomimia.  The speech is fluent and clear. Soft palate rises symmetrically and there is no tongue deviation. Hearing is intact to conversational tone. Motor: Strength is at least antigravity x 4.   Shoulder shrug is equal and symmetric.  There is no pronator drift.  Movement examination: Tone: unable Abnormal movements: *** Coordination:  There is *** decremation with RAM's, *** Gait and Station: The patient has *** difficulty arising out of a deep-seated chair without the use of the hands. The patient's stride length is ***.      Follow up Instructions      -I discussed the assessment and treatment plan with the patient. The patient was provided an  opportunity to ask questions and all were answered. The patient agreed with the plan and demonstrated an understanding of the instructions.   The patient was advised to call back or seek an in-person evaluation if the symptoms worsen or if the condition fails to improve as anticipated.    Total time spent on today's visit was ***minutes, including both face-to-face time and nonface-to-face time.  Time included that spent on review of records (prior notes available to me/labs/imaging if pertinent), discussing treatment and goals, answering patient's questions and coordinating care.   Alonza Bogus, DO

## 2020-06-24 ENCOUNTER — Telehealth: Payer: Medicare Other | Admitting: Neurology

## 2020-06-24 DIAGNOSIS — G20 Parkinson's disease: Secondary | ICD-10-CM | POA: Diagnosis not present

## 2020-06-24 DIAGNOSIS — M159 Polyosteoarthritis, unspecified: Secondary | ICD-10-CM | POA: Diagnosis not present
